# Patient Record
Sex: Female | Born: 1955
Health system: Southern US, Community
[De-identification: ages and names within clinical notes are randomized; demographics above are authoritative.]

## PROBLEM LIST (undated history)

## (undated) DIAGNOSIS — L708 Other acne: Secondary | ICD-10-CM

## (undated) DIAGNOSIS — Z9289 Personal history of other medical treatment: Secondary | ICD-10-CM

## (undated) DIAGNOSIS — Z800 Family history of malignant neoplasm of digestive organs: Secondary | ICD-10-CM

## (undated) DIAGNOSIS — R51 Headache: Secondary | ICD-10-CM

## (undated) DIAGNOSIS — R519 Headache, unspecified: Secondary | ICD-10-CM

## (undated) DIAGNOSIS — F419 Anxiety disorder, unspecified: Secondary | ICD-10-CM

## (undated) DIAGNOSIS — Z803 Family history of malignant neoplasm of breast: Secondary | ICD-10-CM

## (undated) DIAGNOSIS — G4733 Obstructive sleep apnea (adult) (pediatric): Secondary | ICD-10-CM

## (undated) DIAGNOSIS — F3289 Other specified depressive episodes: Secondary | ICD-10-CM

## (undated) DIAGNOSIS — K602 Anal fissure, unspecified: Secondary | ICD-10-CM

## (undated) DIAGNOSIS — K56609 Unspecified intestinal obstruction, unspecified as to partial versus complete obstruction: Secondary | ICD-10-CM

## (undated) DIAGNOSIS — K648 Other hemorrhoids: Secondary | ICD-10-CM

## (undated) DIAGNOSIS — G8929 Other chronic pain: Secondary | ICD-10-CM

## (undated) DIAGNOSIS — E785 Hyperlipidemia, unspecified: Secondary | ICD-10-CM

## (undated) DIAGNOSIS — M1990 Unspecified osteoarthritis, unspecified site: Secondary | ICD-10-CM

## (undated) DIAGNOSIS — F329 Major depressive disorder, single episode, unspecified: Secondary | ICD-10-CM

## (undated) DIAGNOSIS — J309 Allergic rhinitis, unspecified: Secondary | ICD-10-CM

## (undated) DIAGNOSIS — K219 Gastro-esophageal reflux disease without esophagitis: Secondary | ICD-10-CM

## (undated) DIAGNOSIS — L659 Nonscarring hair loss, unspecified: Secondary | ICD-10-CM

## (undated) DIAGNOSIS — F3189 Other bipolar disorder: Secondary | ICD-10-CM

## (undated) DIAGNOSIS — K5900 Constipation, unspecified: Secondary | ICD-10-CM

## (undated) DIAGNOSIS — J302 Other seasonal allergic rhinitis: Secondary | ICD-10-CM

## (undated) DIAGNOSIS — F32A Depression, unspecified: Secondary | ICD-10-CM

## (undated) HISTORY — PX: TONSILLECTOMY: SUR1361

## (undated) HISTORY — DX: Gastro-esophageal reflux disease without esophagitis: K21.9

## (undated) HISTORY — PX: BREAST LUMPECTOMY: SHX2

## (undated) HISTORY — DX: Other hemorrhoids: K64.8

## (undated) HISTORY — DX: Other acne: L70.8

## (undated) HISTORY — DX: Anal fissure, unspecified: K60.2

## (undated) HISTORY — DX: Family history of malignant neoplasm of digestive organs: Z80.0

## (undated) HISTORY — DX: Allergic rhinitis, unspecified: J30.9

## (undated) HISTORY — DX: Other chronic pain: G89.29

## (undated) HISTORY — DX: Major depressive disorder, single episode, unspecified: F32.9

## (undated) HISTORY — DX: Depression, unspecified: F32.A

## (undated) HISTORY — DX: Headache: R51

## (undated) HISTORY — DX: Other specified depressive episodes: F32.89

## (undated) HISTORY — DX: Personal history of other medical treatment: Z92.89

## (undated) HISTORY — DX: Nonscarring hair loss, unspecified: L65.9

## (undated) HISTORY — PX: HAND SURGERY: SHX662

## (undated) HISTORY — DX: Hyperlipidemia, unspecified: E78.5

## (undated) HISTORY — DX: Headache, unspecified: R51.9

## (undated) HISTORY — DX: Family history of malignant neoplasm of breast: Z80.3

## (undated) HISTORY — DX: Unspecified osteoarthritis, unspecified site: M19.90

## (undated) HISTORY — DX: Unspecified intestinal obstruction, unspecified as to partial versus complete obstruction: K56.609

## (undated) HISTORY — PX: KNEE SURGERY: SHX244

## (undated) HISTORY — PX: RADICAL HYSTERECTOMY: SHX2283

## (undated) HISTORY — DX: Obstructive sleep apnea (adult) (pediatric): G47.33

## (undated) HISTORY — DX: Other bipolar disorder: F31.89

## (undated) HISTORY — DX: Anxiety disorder, unspecified: F41.9

---

## 1998-08-05 ENCOUNTER — Other Ambulatory Visit: Admission: RE | Admit: 1998-08-05 | Discharge: 1998-08-05 | Payer: Self-pay | Admitting: Obstetrics & Gynecology

## 1999-09-29 ENCOUNTER — Other Ambulatory Visit: Admission: RE | Admit: 1999-09-29 | Discharge: 1999-09-29 | Payer: Self-pay | Admitting: Obstetrics & Gynecology

## 2001-01-10 ENCOUNTER — Ambulatory Visit (HOSPITAL_COMMUNITY): Admission: RE | Admit: 2001-01-10 | Discharge: 2001-01-10 | Payer: Self-pay | Admitting: Obstetrics & Gynecology

## 2001-01-10 ENCOUNTER — Encounter: Payer: Self-pay | Admitting: Obstetrics & Gynecology

## 2001-05-20 ENCOUNTER — Encounter: Payer: Self-pay | Admitting: Gastroenterology

## 2002-06-13 ENCOUNTER — Other Ambulatory Visit: Admission: RE | Admit: 2002-06-13 | Discharge: 2002-06-13 | Payer: Self-pay | Admitting: Obstetrics & Gynecology

## 2003-03-07 ENCOUNTER — Ambulatory Visit (HOSPITAL_COMMUNITY): Admission: RE | Admit: 2003-03-07 | Discharge: 2003-03-07 | Payer: Self-pay | Admitting: Obstetrics & Gynecology

## 2003-03-07 ENCOUNTER — Encounter: Payer: Self-pay | Admitting: Obstetrics & Gynecology

## 2003-07-28 ENCOUNTER — Other Ambulatory Visit: Admission: RE | Admit: 2003-07-28 | Discharge: 2003-07-28 | Payer: Self-pay | Admitting: *Deleted

## 2004-07-22 ENCOUNTER — Ambulatory Visit: Payer: Self-pay | Admitting: Internal Medicine

## 2004-07-25 ENCOUNTER — Ambulatory Visit (HOSPITAL_COMMUNITY): Admission: RE | Admit: 2004-07-25 | Discharge: 2004-07-25 | Payer: Self-pay | Admitting: Obstetrics & Gynecology

## 2004-10-05 ENCOUNTER — Ambulatory Visit: Payer: Self-pay | Admitting: Internal Medicine

## 2004-10-12 ENCOUNTER — Ambulatory Visit: Payer: Self-pay | Admitting: Internal Medicine

## 2004-10-26 ENCOUNTER — Ambulatory Visit: Payer: Self-pay | Admitting: Internal Medicine

## 2004-11-22 ENCOUNTER — Ambulatory Visit: Payer: Self-pay | Admitting: Internal Medicine

## 2005-01-11 ENCOUNTER — Ambulatory Visit: Payer: Self-pay | Admitting: Internal Medicine

## 2005-01-26 ENCOUNTER — Ambulatory Visit: Payer: Self-pay | Admitting: Internal Medicine

## 2005-03-23 ENCOUNTER — Ambulatory Visit: Payer: Self-pay | Admitting: Internal Medicine

## 2005-04-19 ENCOUNTER — Ambulatory Visit: Payer: Self-pay | Admitting: Internal Medicine

## 2005-04-25 ENCOUNTER — Ambulatory Visit: Payer: Self-pay | Admitting: Gastroenterology

## 2005-06-05 ENCOUNTER — Ambulatory Visit: Payer: Self-pay | Admitting: Internal Medicine

## 2005-08-03 ENCOUNTER — Ambulatory Visit (HOSPITAL_BASED_OUTPATIENT_CLINIC_OR_DEPARTMENT_OTHER): Admission: RE | Admit: 2005-08-03 | Discharge: 2005-08-03 | Payer: Self-pay | Admitting: Orthopedic Surgery

## 2005-08-22 ENCOUNTER — Ambulatory Visit: Payer: Self-pay | Admitting: Internal Medicine

## 2005-09-04 ENCOUNTER — Encounter: Admission: RE | Admit: 2005-09-04 | Discharge: 2005-10-04 | Payer: Self-pay | Admitting: Orthopedic Surgery

## 2005-10-05 ENCOUNTER — Encounter: Admission: RE | Admit: 2005-10-05 | Discharge: 2006-01-03 | Payer: Self-pay | Admitting: Orthopedic Surgery

## 2005-10-11 ENCOUNTER — Ambulatory Visit: Payer: Self-pay | Admitting: Internal Medicine

## 2005-10-30 ENCOUNTER — Ambulatory Visit: Payer: Self-pay | Admitting: Internal Medicine

## 2006-02-16 ENCOUNTER — Ambulatory Visit: Payer: Self-pay | Admitting: Internal Medicine

## 2006-04-18 ENCOUNTER — Ambulatory Visit: Payer: Self-pay | Admitting: Internal Medicine

## 2006-05-29 ENCOUNTER — Inpatient Hospital Stay (HOSPITAL_COMMUNITY): Admission: RE | Admit: 2006-05-29 | Discharge: 2006-05-30 | Payer: Self-pay | Admitting: Obstetrics & Gynecology

## 2006-05-29 ENCOUNTER — Encounter (INDEPENDENT_AMBULATORY_CARE_PROVIDER_SITE_OTHER): Payer: Self-pay | Admitting: *Deleted

## 2006-06-27 ENCOUNTER — Ambulatory Visit: Payer: Self-pay | Admitting: Internal Medicine

## 2006-06-27 LAB — CONVERTED CEMR LAB
Lithium Lvl: 1 meq/L (ref 0.80–1.40)
TSH: 0.83 microintl units/mL (ref 0.35–5.50)

## 2007-03-20 ENCOUNTER — Ambulatory Visit: Payer: Self-pay | Admitting: Internal Medicine

## 2007-03-20 DIAGNOSIS — E785 Hyperlipidemia, unspecified: Secondary | ICD-10-CM | POA: Insufficient documentation

## 2007-03-20 DIAGNOSIS — F3189 Other bipolar disorder: Secondary | ICD-10-CM | POA: Insufficient documentation

## 2007-03-20 DIAGNOSIS — F32A Depression, unspecified: Secondary | ICD-10-CM | POA: Insufficient documentation

## 2007-03-20 DIAGNOSIS — F329 Major depressive disorder, single episode, unspecified: Secondary | ICD-10-CM | POA: Insufficient documentation

## 2007-03-20 DIAGNOSIS — L708 Other acne: Secondary | ICD-10-CM | POA: Insufficient documentation

## 2007-03-20 DIAGNOSIS — J069 Acute upper respiratory infection, unspecified: Secondary | ICD-10-CM | POA: Insufficient documentation

## 2007-03-20 LAB — CONVERTED CEMR LAB: Lithium Lvl: 0.98 meq/L (ref 0.80–1.40)

## 2007-03-21 LAB — CONVERTED CEMR LAB
BUN: 11 mg/dL (ref 6–23)
CO2: 25 meq/L (ref 19–32)
Calcium: 10 mg/dL (ref 8.4–10.5)
Chloride: 112 meq/L (ref 96–112)
Cholesterol: 192 mg/dL (ref 0–200)
Creatinine, Ser: 0.8 mg/dL (ref 0.4–1.2)
GFR calc Af Amer: 98 mL/min
GFR calc non Af Amer: 81 mL/min
Glucose, Bld: 98 mg/dL (ref 70–99)
HDL: 44.4 mg/dL (ref >39.0)
LDL Cholesterol: 121 mg/dL — ABNORMAL HIGH (ref 0–99)
Potassium: 3.9 meq/L (ref 3.5–5.1)
Sodium: 141 meq/L (ref 135–145)
TSH: 0.99 microintl units/mL (ref 0.35–5.50)
Total CHOL/HDL Ratio: 4.3
Triglycerides: 131 mg/dL (ref 0–149)
VLDL: 26 mg/dL (ref 0–40)

## 2007-04-08 ENCOUNTER — Telehealth: Payer: Self-pay | Admitting: Internal Medicine

## 2007-04-26 ENCOUNTER — Encounter: Payer: Self-pay | Admitting: Internal Medicine

## 2007-06-04 ENCOUNTER — Telehealth: Payer: Self-pay | Admitting: Internal Medicine

## 2007-07-11 ENCOUNTER — Ambulatory Visit: Payer: Self-pay | Admitting: Gastroenterology

## 2007-08-08 ENCOUNTER — Ambulatory Visit: Payer: Self-pay | Admitting: Gastroenterology

## 2007-08-08 ENCOUNTER — Encounter: Payer: Self-pay | Admitting: Internal Medicine

## 2008-01-02 ENCOUNTER — Telehealth (INDEPENDENT_AMBULATORY_CARE_PROVIDER_SITE_OTHER): Payer: Self-pay | Admitting: *Deleted

## 2008-01-07 ENCOUNTER — Telehealth (INDEPENDENT_AMBULATORY_CARE_PROVIDER_SITE_OTHER): Payer: Self-pay | Admitting: *Deleted

## 2008-01-13 ENCOUNTER — Encounter: Payer: Self-pay | Admitting: Internal Medicine

## 2008-01-14 ENCOUNTER — Encounter
Admission: RE | Admit: 2008-01-14 | Discharge: 2008-01-14 | Payer: Self-pay | Admitting: Physical Medicine and Rehabilitation

## 2008-01-22 ENCOUNTER — Encounter: Payer: Self-pay | Admitting: Internal Medicine

## 2008-04-13 ENCOUNTER — Telehealth: Payer: Self-pay | Admitting: Gastroenterology

## 2008-04-24 DIAGNOSIS — Z860100 Personal history of colon polyps, unspecified: Secondary | ICD-10-CM | POA: Insufficient documentation

## 2008-04-28 ENCOUNTER — Ambulatory Visit: Payer: Self-pay | Admitting: Gastroenterology

## 2008-04-28 DIAGNOSIS — K648 Other hemorrhoids: Secondary | ICD-10-CM | POA: Insufficient documentation

## 2008-04-28 DIAGNOSIS — R109 Unspecified abdominal pain: Secondary | ICD-10-CM | POA: Insufficient documentation

## 2008-08-25 ENCOUNTER — Encounter: Payer: Self-pay | Admitting: Internal Medicine

## 2008-08-25 ENCOUNTER — Ambulatory Visit (HOSPITAL_BASED_OUTPATIENT_CLINIC_OR_DEPARTMENT_OTHER): Admission: RE | Admit: 2008-08-25 | Discharge: 2008-08-25 | Payer: Self-pay | Admitting: Obstetrics & Gynecology

## 2008-08-29 ENCOUNTER — Ambulatory Visit: Payer: Self-pay | Admitting: Internal Medicine

## 2008-10-06 ENCOUNTER — Ambulatory Visit: Payer: Self-pay | Admitting: Internal Medicine

## 2008-10-06 DIAGNOSIS — G4733 Obstructive sleep apnea (adult) (pediatric): Secondary | ICD-10-CM | POA: Insufficient documentation

## 2008-10-13 ENCOUNTER — Encounter: Payer: Self-pay | Admitting: Internal Medicine

## 2008-10-18 DIAGNOSIS — J302 Other seasonal allergic rhinitis: Secondary | ICD-10-CM | POA: Insufficient documentation

## 2008-10-18 DIAGNOSIS — J3089 Other allergic rhinitis: Secondary | ICD-10-CM

## 2008-10-21 ENCOUNTER — Encounter: Payer: Self-pay | Admitting: Internal Medicine

## 2008-10-29 ENCOUNTER — Encounter: Payer: Self-pay | Admitting: Internal Medicine

## 2008-11-24 ENCOUNTER — Telehealth: Payer: Self-pay | Admitting: Internal Medicine

## 2008-12-22 ENCOUNTER — Encounter: Payer: Self-pay | Admitting: Internal Medicine

## 2008-12-27 ENCOUNTER — Telehealth: Payer: Self-pay | Admitting: Internal Medicine

## 2009-01-05 ENCOUNTER — Ambulatory Visit: Payer: Self-pay | Admitting: Internal Medicine

## 2009-01-06 ENCOUNTER — Telehealth (INDEPENDENT_AMBULATORY_CARE_PROVIDER_SITE_OTHER): Payer: Self-pay | Admitting: *Deleted

## 2009-01-07 ENCOUNTER — Telehealth: Payer: Self-pay | Admitting: Internal Medicine

## 2009-01-18 ENCOUNTER — Encounter: Payer: Self-pay | Admitting: Internal Medicine

## 2009-01-27 ENCOUNTER — Encounter: Payer: Self-pay | Admitting: Internal Medicine

## 2009-03-05 ENCOUNTER — Ambulatory Visit: Payer: Self-pay | Admitting: Family Medicine

## 2009-03-05 DIAGNOSIS — L659 Nonscarring hair loss, unspecified: Secondary | ICD-10-CM | POA: Insufficient documentation

## 2009-03-08 LAB — CONVERTED CEMR LAB: TSH: 0.98 microintl units/mL (ref 0.35–5.50)

## 2009-05-20 ENCOUNTER — Ambulatory Visit: Payer: Self-pay | Admitting: Internal Medicine

## 2009-05-25 ENCOUNTER — Ambulatory Visit: Payer: Self-pay | Admitting: Internal Medicine

## 2009-12-03 ENCOUNTER — Encounter: Payer: Self-pay | Admitting: Internal Medicine

## 2009-12-21 ENCOUNTER — Ambulatory Visit (HOSPITAL_COMMUNITY): Admission: RE | Admit: 2009-12-21 | Discharge: 2009-12-21 | Payer: Self-pay | Admitting: Obstetrics & Gynecology

## 2010-02-01 ENCOUNTER — Encounter: Admission: RE | Admit: 2010-02-01 | Discharge: 2010-02-01 | Payer: Self-pay | Admitting: Neurosurgery

## 2010-03-18 ENCOUNTER — Encounter: Payer: Self-pay | Admitting: Internal Medicine

## 2010-07-01 ENCOUNTER — Ambulatory Visit (HOSPITAL_COMMUNITY)
Admission: RE | Admit: 2010-07-01 | Discharge: 2010-07-01 | Payer: Self-pay | Source: Home / Self Care | Attending: Obstetrics & Gynecology | Admitting: Obstetrics & Gynecology

## 2010-07-04 LAB — CBC
HCT: 37.8 % (ref 36.0–46.0)
Hemoglobin: 13 g/dL (ref 12.0–15.0)
MCH: 30.5 pg (ref 26.0–34.0)
MCHC: 34.4 g/dL (ref 30.0–36.0)
MCV: 88.7 fL (ref 78.0–100.0)
Platelets: 319 10*3/uL (ref 150–400)
RBC: 4.26 MIL/uL (ref 3.87–5.11)
RDW: 13.3 % (ref 11.5–15.5)
WBC: 6.1 10*3/uL (ref 4.0–10.5)

## 2010-07-04 LAB — TYPE AND SCREEN
ABO/RH(D): B POS
Antibody Screen: NEGATIVE

## 2010-07-04 LAB — SURGICAL PCR SCREEN
MRSA, PCR: NEGATIVE
Staphylococcus aureus: NEGATIVE

## 2010-07-10 ENCOUNTER — Encounter: Payer: Self-pay | Admitting: Gastroenterology

## 2010-07-15 NOTE — Op Note (Addendum)
NAMEKYLEAH, Brittany [name]       ACCOUNT NO.:  [id number]  MEDICAL RECORD NO.:  [id number]          PATIENT TYPE:  AMB  LOCATION:  SDC                           FACILITY:  [facility]  PHYSICIAN:  Genia Del, M.D.DATE OF BIRTH:  1955-06-29  DATE OF PROCEDURE:  07/01/2010 DATE OF DISCHARGE:  07/01/2010                              OPERATIVE REPORT   PREOPERATIVE DIAGNOSES:  Left ovarian cysts, status post total laparoscopy hysterectomy and right salpingo-oophorectomy.  POSTOPERATIVE DIAGNOSES:  Left ovarian cysts, status post total laparoscopy hysterectomy and right salpingo-oophorectomy and adhesions between left ovary and left pelvic wall and bowels and adhesions between small bowel and right pelvic wall.  PROCEDURE:  Left oophorectomy assisted with da Vinci Robot and lysis of adhesions.  SURGEON:  Genia Del, M.D.  ASSISTANT:  Lendon Colonel, MD  PROCEDURE:  Under general anesthesia with endotracheal intubation, the patient is in lithotomy position.  She was prepped with Surgi-Prep on the abdomen and Betadine on the vulvar and vaginal areas.  We then draped the patient as usual.  The Foley was put in place in the bladder. We went to the abdomen.  The patient previously had a Engineer, building services assisted TLH-RSO therefore the same incisions will be used.  We infiltrated the skin with Marcaine 0.25% plain at all the incision sites.  We opened the supraumbilical incision over 1.5 cm with the scalpel.  We opened the aponeurosis with Mayo scissors and opened the parietal peritoneum with Mayo scissors.  We put a pursestring stitch of Vicryl 0 on the aponeurosis.  We inserted the [facility] at that level and created a pneumoperitoneum with CO2.  We inserted the camera at that level.  No adhesion is present between the bowels and the anterior wall.  We therefore made incisions at all the previous sites with a scalpel.  This was a semicircular configuration with 2 robotic ports on the  left, one robotic port on the right, and the assistant port on the lower right. Once all ports are inserted under direct vision, we docked the robot on the left side without difficulty.  We then insert the instruments.  The Endo Shear scissors on the first robotic arm, the PK on the second robotic arm and the Prograf on the third robotic arm.  We then went to the console.  We visualized the abdomen cavity which was normal.  The pelvic cavity presented a large ovarian cyst on the left completely adherent to the left pelvic wall.  The bowels were also adherent to the ovarian cyst.  We had adhesions also between the small bowels and the right pelvic wall.  We started at that level and those are very fine adhesions, and they are easily freed, on the right pelvic sidewall.  We then went to the left ovary with a cyst that is about 5-6 cm in diameter.  This ovarian cyst is completely adherent all around from the infundibulopelvic ligament on the upper left side wall to the bladder to the lower pelvic sidewall at the lower part of the ovary and all the way up to the infundibulopelvic ligament from the inner side.  We went very gently starting to free  the distal aspect of the ovary close to the bladder and the upper side wall.  We then freed the inner part being very careful with the sidewall in the left ureter.  We freed the left colon from the inner lower side of the ovary.  Finally, we can visualize the anatomy better the cul-de-sac was visible.  The left side wall was visible and we can identify the infundibulopelvic ligaments very clearly high up far from the left ureter.  We therefore cauterized and sectioned the left infundibulopelvic ligament.  We used a PK to coagulate and the Endo Shear scissors to section.  From that point on, we can lift the left ovary with the cyst very nicely.  Note that in the process, the ovarian cyst ruptured and a very clear yellowish fluid was suctioned. We had  done washings prior to any manipulation in the pelvis, and the surface of the ovarian cyst was very smooth with no solid part at all. We proceeded the dissection until the left ovary was completely freed. Hemostasis was adequate at all levels.  We used an Endobag and the assistant port.  The left ovary was put in the bag and sent to Pathology.  We then irrigated and suctioned the pelvic cavity.  We had excellent hemostasis.  We completed the lysis of adhesions between the bowels and the pelvic wall.  We then removed all instruments.  We undocked the robot and we went by laparoscopy.  We removed Trendelenburg and irrigated and suctioned.  We then used Interceed cut in 2 and applied on both pelvic sidewalls to prevent recurrence of adhesions.  We had excellent hemostasis at all levels.  We therefore removed all instruments, evacuated the CO2, removed all ports under direct vision. We closed all incisions with Vicryl 4-0 in a subcuticular stitch and add Dermabond on all incisions at the supraumbilical incision the pursestring stitch was attached first and then the Vicryl 4-0 and Dermabond.  Hemostasis was adequate at all incisions.  The estimated blood loss was less than 50 mL.  The patient received Ancef 1 g IV before induction.  The count of instruments, sponges was complete.  No complications occurred, and the patient was brought to recovery room in good stable status.     Genia Del, M.D.     ML/MEDQ  D:  07/01/2010  T:  07/02/2010  Job:  [id number]  Electronically Signed by Genia Del M.D. on 07/15/2010 09:16:03 AM

## 2010-07-19 NOTE — Letter (Signed)
Summary: [facility]  [facility]   Imported By: Maryln Gottron 12/28/2009 12:38:25  _____________________________________________________________________  External Attachment:    Type:   Image     Comment:   External Document

## 2010-07-19 NOTE — Letter (Signed)
Summary: [facility]  [facility]   Imported By: Maryln Gottron 04/18/2010 11:08:00  _____________________________________________________________________  External Attachment:    Type:   Image     Comment:   External Document

## 2010-08-19 ENCOUNTER — Other Ambulatory Visit: Payer: Self-pay | Admitting: Obstetrics & Gynecology

## 2010-08-19 DIAGNOSIS — R9230 Dense breasts, unspecified: Secondary | ICD-10-CM

## 2010-08-19 DIAGNOSIS — R922 Inconclusive mammogram: Secondary | ICD-10-CM

## 2010-08-19 DIAGNOSIS — N649 Disorder of breast, unspecified: Secondary | ICD-10-CM

## 2010-08-19 DIAGNOSIS — Z803 Family history of malignant neoplasm of breast: Secondary | ICD-10-CM

## 2010-09-04 LAB — CBC
HCT: 39.9 % (ref 36.0–46.0)
Hemoglobin: 13.7 g/dL (ref 12.0–15.0)
MCH: 31.6 pg (ref 26.0–34.0)
MCHC: 34.4 g/dL (ref 30.0–36.0)
MCV: 91.8 fL (ref 78.0–100.0)
Platelets: 372 10*3/uL (ref 150–400)
RBC: 4.35 MIL/uL (ref 3.87–5.11)
RDW: 13.4 % (ref 11.5–15.5)
WBC: 7.1 10*3/uL (ref 4.0–10.5)

## 2010-09-04 LAB — COMPREHENSIVE METABOLIC PANEL
ALT: 27 U/L (ref 0–35)
AST: 22 U/L (ref 0–37)
Albumin: 4.1 g/dL (ref 3.5–5.2)
Alkaline Phosphatase: 48 U/L (ref 39–117)
BUN: 4 mg/dL — ABNORMAL LOW (ref 6–23)
CO2: 25 mEq/L (ref 19–32)
Calcium: 10.2 mg/dL (ref 8.4–10.5)
Chloride: 111 mEq/L (ref 96–112)
Creatinine, Ser: 0.71 mg/dL (ref 0.4–1.2)
GFR calc Af Amer: >60 mL/min (ref >60)
GFR calc non Af Amer: >60 mL/min (ref >60)
Glucose, Bld: 103 mg/dL — ABNORMAL HIGH (ref 70–99)
Potassium: 4.3 mEq/L (ref 3.5–5.1)
Sodium: 141 mEq/L (ref 135–145)
Total Bilirubin: 0.5 mg/dL (ref 0.3–1.2)
Total Protein: 6.9 g/dL (ref 6.0–8.3)

## 2010-09-04 LAB — TYPE AND SCREEN
ABO/RH(D): B POS
Antibody Screen: NEGATIVE

## 2010-09-04 LAB — ABO/RH: ABO/RH(D): B POS

## 2010-09-04 LAB — SURGICAL PCR SCREEN
MRSA, PCR: NEGATIVE
Staphylococcus aureus: NEGATIVE

## 2010-10-27 ENCOUNTER — Ambulatory Visit
Admission: RE | Admit: 2010-10-27 | Discharge: 2010-10-27 | Disposition: A | Discharge status: Home / Self Care | Payer: Medicare Other | Source: Ambulatory Visit | Attending: Obstetrics & Gynecology | Admitting: Obstetrics & Gynecology

## 2010-10-27 DIAGNOSIS — Z803 Family history of malignant neoplasm of breast: Secondary | ICD-10-CM

## 2010-10-27 DIAGNOSIS — R9230 Dense breasts, unspecified: Secondary | ICD-10-CM

## 2010-10-27 DIAGNOSIS — R922 Inconclusive mammogram: Secondary | ICD-10-CM

## 2010-10-27 DIAGNOSIS — N649 Disorder of breast, unspecified: Secondary | ICD-10-CM

## 2010-10-27 MED ORDER — GADOBENATE DIMEGLUMINE 529 MG/ML IV SOLN
15.0000 mL | Freq: Once | INTRAVENOUS | Status: AC | PRN
  Administered 2010-10-27: 15 mL via INTRAVENOUS

## 2010-11-01 NOTE — Procedures (Signed)
NAME:  Brittany [name], Brittany [name]       ACCOUNT NO.:  [id number]   MEDICAL RECORD NO.:  [id number]          PATIENT TYPE:  OUT   LOCATION:  SLEEP CENTER                 FACILITY:  [facility]   PHYSICIAN:  Clinton D. Maple Hudson, MD, FCCP, FACPDATE OF BIRTH:  1956-04-07   DATE OF STUDY:  08/25/2008                            NOCTURNAL POLYSOMNOGRAM   REFERRING PHYSICIAN:   REFERRING PHYSICIAN:  Genia Del, MD   DATE OF THE STUDY:  August 25, 2008   INDICATION FOR STUDY:  Insomnia with sleep apnea.   EPWORTH SLEEPINESS SCORE:  0/24.  BMI 34.8.  Weight 190 pounds.  Height  62 inches.  Neck 14 inches.   MEDICATIONS:  Charted and reviewed.   SLEEP ARCHITECTURE:  Total sleep time 301 minutes with sleep efficiency  75.3%.  Stage I was 16.1%, stage II 58%, stage III 15.3%, REM 10.6% of  total sleep time.  Sleep latency 25 minutes, REM latency 145 minutes,  awake after sleep onset 73.5 minutes, arousal index 29.7.  Bedtime  medication cyclobenzaprine.   RESPIRATORY DATA:  Apnea/hypopnea index (AHI) 10.8 per hour.  A total of  54 events was scored, all as hypopneas.  Events were not positional,  more frequent while supine as expected.  REM AHI 30 per hour.  This was  a diagnostic NPSG study as requested and CPAP titration was not done.   OXYGEN DATA:  Moderate snoring with oxygen desaturation to a nadir of  85%.  Mean oxygen saturation through the study was 93.3% on room air.   CARDIAC DATA:  Normal sinus rhythm.   MOVEMENT-PARASOMNIA:  No significant movement disorder.  Bathroom x1.   IMPRESSIONS-RECOMMENDATIONS:  1. Mild obstructive sleep apnea/hypopnea syndrome, apnea-hypopnea      index 10.8 per hour.  Events were not positional, but somewhat more      common while supine.  Moderate snoring with oxygen desaturation to      a nadir of 85% on room air.  2. Scores in this range may be addressed conservatively with weight      loss treatment for significant nasal      congestion or upper  airway obstruction, and encouragement to sleep      off flat of back.  If clinical situation warrants, then the patient      can return for CPAP titration or evaluate for alternative      management as appropriate.      Clinton D. Maple Hudson, MD, [facility], FACP  Diplomate, Biomedical engineer of Sleep Medicine  Electronically Signed     CDY/MEDQ  D:  08/30/2008 09:48:45  T:  08/31/2008 16:10:96  Job:  [id number]

## 2010-11-01 NOTE — Assessment & Plan Note (Signed)
[facility]                         GASTROENTEROLOGY OFFICE NOTE   NAME:Brittany [name], Brittany [name]              MRN:          [id number]  DATE:07/11/2007                            DOB:          12-28-1955    This is a 55 year old [place] American female that I have previously  evaluated for change in bowel habits and hematochezia.  She underwent  colonoscopy in December 2002 that showed a hyperplastic colon polyp and  internal hemorrhoids that were injected.  She has a history of large  uterine leiomyomas and she is status post partial hysterectomy in  December 2007.  She states shortly after the hysterectomy she noted  worsening problems with constipation.  She notes hard small stools and  difficulty in evacuating.  She has used a fiber supplement and a colon  cleanse laxative on a regular basis without significant improvement in  symptoms.  She has recently noted small amounts of bright red blood per  rectum with hard bowel movements.  She states she sent in Hemoccults  through the [facility] approximately one week ago and the results are  pending.  She complains of frequent post prandial and evening nausea  that is temporarily improved with Maalox, she takes aspirin on an  intermittent basis for body aches and pains.  She has a maternal uncle  who developed colon cancer at about age 48, no other family members with  colon cancer, colon polyps or inflammatory bowel disease.  She has no  dysphagia, odynophagia, nausea, vomiting or weight loss.   CURRENT MEDICATIONS:  Listed on the chart, updated and reviewed.   MEDICATION ALLERGIES:  HYDROCODONE LEADING TO NAUSEA AND VOMITING.   PHYSICAL EXAMINATION:  No acute distress.  Weight 173.8 pounds, blood  pressure is 86/60 in both arms, pulse is 76 and regular.  HEENT:  Anicteric sclerae, oropharynx clear.  CHEST:  Clear to auscultation bilaterally.  CARDIAC:  Regular rate and rhythm without murmurs  appreciated.  ABDOMEN:  Soft, nontender, nondistended, normoactive bowel sounds, no  palpable organomegaly, masses or hernias.   ASSESSMENT/PLAN:  1. Worsening constipation associated with small volume hematochezia      and a maternal uncle with colon cancer.  Need to exclude colorectal      neoplasms and other disorders.  She may have functional      constipation with hemorrhoidal bleeding.  She is advised to      discontinue colon cleanse and continue on daily fiber supplements.      She is advised to increase her fiber and fluid intake.  Begin      MiraLax up to 4 times a day, titrated for adequate bowel movements.      Risks, benefits and alternatives to colonoscopy with possible      biopsy, possible polypectomy and possible destruction of internal      hemorrhoids discussed with the patient and she consent to proceed.      This will be scheduled electively.  2. Suspected reflux symptoms, rule out ulcer disease, gastritis and      duodenitis.  Minimize aspirin and non-steroidal anti-inflammatory      drug usage.  Begin omeprazole  20 mg p.o.      q.a.m.  Risks, benefits and alternatives to upper endoscopy with      possible biopsy      discussed with the patient and she consent to proceed.  This will      be scheduled electively at the time of her colonoscopy.     Brittany Lick. Russella Dar, MD, [facility]  Electronically Signed    MTS/MedQ  DD: 07/11/2007  DT: 07/11/2007  Job #: [id number]

## 2010-11-04 NOTE — Discharge Summary (Signed)
NAMELAYAH, SKOUSEN       ACCOUNT NO.:  [id number]   MEDICAL RECORD NO.:  [id number]          PATIENT TYPE:  INP   LOCATION:  1614                         FACILITY:  [facility]   PHYSICIAN:  Genia Del, M.D.DATE OF BIRTH:  April 20, 1956   DATE OF ADMISSION:  05/29/2006  DATE OF DISCHARGE:  05/30/2006                               DISCHARGE SUMMARY   ADMISSION DIAGNOSIS:  Large symptomatic uterine myomas.   DISCHARGE DIAGNOSIS:  Large symptomatic uterine myomas.   INTERVENTION:  Diagnostic laparoscopy with total abdominal hysterectomy.   HOSPITAL COURSE:  The patient was admitted for large symptomatic uterine  myomas and a total laparoscopic hysterectomy assisted with DaVinci robot  was contemplated but a diagnostic laparoscopy was first done and it was  realized that the uterus with the myomas was too wide and not movable  from side-to-side to access the lateral aspects of the uterus to  complete the surgery with robotics. The decision was therefore taken to  proceed with laparotomy. A Pfannenstiel incision was done and a total  abdominal hysterectomy was performed.  The estimated blood loss was 400  mL. No complications occurred.  The patient was doing very well on  postoperative day #1. She was afebrile and hemodynamically stable.  Her  postoperative hemoglobin was 9.7.  The patient was doing so well that  the decision was taken to discharge her on December 12. She was given  postoperative advice, Dilaudid tablets were prescribed p.r.n. for pain,  and she was advised to follow-up at [facility] on December 14 for  staple removal.  The final pathology came back multiple leiomyomas.      Genia Del, M.D.  Electronically Signed     ML/MEDQ  D:  08/08/2006  T:  08/08/2006  Job:  [id number]

## 2010-11-04 NOTE — Op Note (Signed)
NAMESHIRLETTE, SCARBER       ACCOUNT NO.:  [id number]   MEDICAL RECORD NO.:  [id number]          PATIENT TYPE:  AMB   LOCATION:  DSC                          FACILITY:  [facility]   PHYSICIAN:  Nadara Mustard, MD     DATE OF BIRTH:  February 03, 1956   DATE OF PROCEDURE:  08/03/2005  DATE OF DISCHARGE:                                 OPERATIVE REPORT   PREOPERATIVE DIAGNOSIS:  Medial meniscal tear, left knee.   POSTOPERATIVE DIAGNOSIS:  Medial meniscal tear, left knee, and osteochondral  defect medial femoral condyle, medial tibial plateau, patella, and trochlea.   PROCEDURE:  1.  Partial medial meniscectomy.  2.  Abrasion chondroplasty medial femoral condyle, medial tibial plateau,      patella, and trochlea.   SURGEON:  Nadara Mustard, M.D.   ANESTHESIA:  Knee block.   ESTIMATED BLOOD LOSS:  Minimal.   ANTIBIOTICS:  None.   DRAINS:  None.   COMPLICATIONS:  None.   TOURNIQUET TIME:  None.   DISPOSITION:  To the PACU in stable condition.   INDICATIONS FOR PROCEDURE:  The patient is a 55 year old woman with  mechanical symptoms in her left knee.  She has failed conservative care.  MRI scan confirmed a medial meniscal tear and she presents at this time for  arthroscopic intervention.  The risks and benefits were discussed including  infection, neurovascular injury, persistent pain, need for additional  surgery.  The patient states she understands and wishes to proceed at this  time.   DESCRIPTION OF PROCEDURE:  The patient underwent a knee block and was  brought to OR room 5.  After an adequate level of anesthesia was obtained,  the patient's left lower extremity was prepped using DuraPrep and draped in  a sterile field.  The scope was inserted through the inferolateral portal  and an inferomedial working portal was established.  The patient did have  some pain despite Propofol and the LMA was placed during the procedure.  Examination showed a degenerative tear of the  medial meniscus which was  debrided.  She also had a large osteochondral defect of the medial tibial  plateau and medial femoral condyle.  This was debrided with a shaver down  through viable bleeding subchondral bone.  Examination of the notch showed  an intact ACL.  Examination of the lateral joint line showed good articular  cartilage and an intact lateral meniscus.  Examination of the patellofemoral  joint showed a large osteochondral defect of the patella and trochlea and  this was also debrided with an abrasion chondroplasty.  A survey was then  again performed of all three compartments including the medial and lateral  gutters.  There were no loose bodies in the suprapatellar pouch, medial  lateral joint lines, or medial and lateral gutters.  The instruments were  removed. The portals were closed using 4-0 nylon.  The wounds were covered  with Adaptic, orthopedic sponges, sterile Webril, and a Coban dressing.  The  patient was then extubated and taken to the PACU in stable condition.  Follow up in the office in two weeks.      Randa Evens  Lajoyce Corners, MD  Electronically Signed     MVD/MEDQ  D:  08/03/2005  T:  08/03/2005  Job:  [id number]

## 2010-11-04 NOTE — Op Note (Signed)
[name], [name]       ACCOUNT NO.:  [id number]   MEDICAL RECORD NO.:  [id number]          PATIENT TYPE:  INP   LOCATION:  0005                         FACILITY:  [facility]   PHYSICIAN:  Genia Del, M.D.DATE OF BIRTH:  10-22-1955   DATE OF PROCEDURE:  05/29/2006  DATE OF DISCHARGE:                               OPERATIVE REPORT   PREOPERATIVE DIAGNOSIS:  Large symptomatic uterine myomas.   POSTOPERATIVE DIAGNOSIS:  Large symptomatic uterine myomas.   PROCEDURE:  Diagnostic laparoscopy, followed by total abdominal  hysterectomy.   SURGEON:  Genia Del, M.D.   ASSISTANT:  Richardean Sale, M.D.   PROCEDURE:  Under general anesthesia with endotracheal intubation, the  patient is in lithotomy position.  She is prepped with Betadine on the  abdominal, suprapubic, vulvar, and vaginal areas. The patient is draped  as usual.  A bladder catheter is inserted, and we proceed with insertion  of the KOH ring and RUMI vaginally.  The vaginal exam reveals a mobile  uterus, although very voluminous, reaching 22 cm, about 2 cm above the  umbilicus.  We then go to abdominal time. We make an incision about 10  cm above the fundus with the scalpel after infiltrating the subcutaneous  tissue with Marcaine 0.25% plain. We open the aponeurosis with Mayo  scissors under direct vision, and we put a pursestring stitch at that  level.  We then open the parietal peritoneum bluntly with a finger.  We  then insert the Hasson at that level and introduce the camera after  creating a pneumoperitoneum with CO2.  We visualize the abdominopelvic  cavities.  The uterus is very large. We can see a large myoma in the  right fundal area which was reaching 13 cm by ultrasound.  The vision is  good in terms of height of the uterus, but the width of the uterus is  preventing good access on either side for robotic surgery.  The decision  is therefore taken to remove the camera, remove the Hasson, and  proceed  with total abdominal hysterectomy.  We close the pursestring stitch on  the aponeurosis, attach it, and we close the skin with a subcuticular  stitch of Vicryl 4-0.  We make a Pfannenstiel incision, but a wide one,  with a scalpel.  We open the aponeurosis with Mayo scissors.  The recti  muscles are separated from the aponeurosis on the midline superiorly and  inferiorly.  We opened the parietal peritoneum longitudinally with  Metzenbaum scissors.  We succeed in exteriorizing the uterus. That  caused bleeding from the two subserosal fibroids on the left side.  We  suture that with chromic 0 to control hemostasis until the hysterectomy  is completed we also have a hemorrhagic cyst on the right ovary which  ruptures, and Vicryl two sutures are done to control hemostasis at that  level.  We suture and cut with the electrocautery the right round  ligament and open the visceral peritoneum anteriorly to recline the  bladder.  We did the same on the left side, and the bladder is brought  down. We double clamped with curved Heaneys the tube and utero-ovarian  ligament on the right side.  We section in between and suture with  Vicryl 0.  We go down the right lateral side of the uterus that way with  curved Heaneys, and then straight Heaneys.  We then do the same on the  left side, double clamping and sectioning the left tube and utero-  ovarian ligament.  We continue on the left lateral side of the uterus  with curved Heaney.  We then reach the left uterine artery. We clamp it  with curved Heaneys. We section and suture it with a Vicryl 0.  We then  clamp the left uterosacral ligament.  We section and suture and keep  that on a hemostat. We proceed the same way on the right side. We reach  the angle of the vagina.  We clamped with a curved Heaney at that level,  section with Mayo scissors, and suture with a Heaney stitch of Vicryl 0  and keep that on a hemostat. We proceed the same way on  the right side,  and the uterus is sent to pathology in one piece.  A picture is taken of  it, and the uterus is weighed.  The weight is 5 pounds 4 ounces.  We  then attach the left uterosacral ligament to the left angle of the  vagina.  We do the same on the right side.  We close the vagina with a  locked-running suture of Vicryl 0.  All the pedicles are hemostatic.  We  irrigate and suction the pelvic cavity.  We had put a Balfour and laps  to retract the bowels when the uterus was removed, so now we remove  those.  We closed the aponeurosis with two half-running sutures of  Vicryl 0.  We complete hemostasis at the skin with the electrocautery,  and we approximate the skin with staples.  A dry dressing is applied.  Note that the KOH ring and RUMI were removed at the beginning of the  laparotomy. The estimated blood loss was 400 cc.  The patient was given  Ancef 1 g IV at the beginning of the intervention.  The count of sponges  and instruments was complete x2.  No complication occurred.  The patient  was transferred to recovery room in good stable status.      Genia Del, M.D.  Electronically Signed     ML/MEDQ  D:  05/29/2006  T:  05/29/2006  Job:  [id number]

## 2011-04-04 ENCOUNTER — Telehealth: Payer: Self-pay | Admitting: Internal Medicine

## 2011-04-04 NOTE — Telephone Encounter (Signed)
Pt would like a referral to cardiologist for chest pains and fatigue. Pt is not having chest pain now.

## 2011-04-05 NOTE — Telephone Encounter (Signed)
ok 

## 2011-04-06 NOTE — Telephone Encounter (Signed)
Order sent to [facility].

## 2011-04-27 ENCOUNTER — Encounter: Payer: Self-pay | Admitting: *Deleted

## 2011-04-28 ENCOUNTER — Encounter: Payer: Self-pay | Admitting: Cardiovascular Disease

## 2011-04-28 ENCOUNTER — Ambulatory Visit (INDEPENDENT_AMBULATORY_CARE_PROVIDER_SITE_OTHER): Payer: Medicare Other | Admitting: Cardiovascular Disease

## 2011-04-28 DIAGNOSIS — R079 Chest pain, unspecified: Secondary | ICD-10-CM | POA: Insufficient documentation

## 2011-04-28 DIAGNOSIS — E785 Hyperlipidemia, unspecified: Secondary | ICD-10-CM

## 2011-04-28 NOTE — Patient Instructions (Signed)
Your physician has requested that you have a stress echocardiogram. For further information please visit [email or web address]. Please follow instruction sheet as given.   

## 2011-04-28 NOTE — Progress Notes (Signed)
55 yo patient of Dr Cato Mulligan.  Atypical SSCP last 2 months.  In talking to the patient it is obvious that the issue is stress and it relates to her husband's health.  She is totally overwhelmed with his illness's and needs a break.  She visibly sighing and hyperventilating in the room and gets emotional when she discusses it.  SSCP is heaviness in chest can radiate to both arms and is not related to exercise.  Dyspnea with exercise and elliptical.  No palpitations or syncope.  Compliant with meds.  No previous history of heart problems  ROS: Denies fever, malais, weight loss, blurry vision, decreased visual acuity, cough, sputum, SOB, hemoptysis, pleuritic pain, palpitaitons, heartburn, abdominal pain, melena, lower extremity edema, claudication, or rash.  All other systems reviewed and negative   General: Affect appropriate Healthy:  appears stated age HEENT: normal Neck supple with no adenopathy JVP normal no bruits no thyromegaly Lungs clear with no wheezing and good diaphragmatic motion Heart:  S1/S2 no murmur,rub, gallop or click PMI normal Abdomen: benighn, BS positve, no tenderness, no AAA no bruit.  No HSM or HJR Distal pulses intact with no bruits No edema Neuro non-focal Skin warm and dry No muscular weakness  Medications Current Outpatient Prescriptions  Medication Sig Dispense Refill  . aspirin 325 MG EC tablet Take 325 mg by mouth 2 (two) times daily.        Marland Kitchen lamoTRIgine (LAMICTAL) 150 MG tablet Take 150 mg by mouth daily.        Marland Kitchen lithium carbonate (ESKALITH) 450 MG CR tablet Take 450 mg by mouth 2 (two) times daily.        . Multiple Vitamin (MULTIVITAMIN) capsule Take 1 capsule by mouth daily.          Allergies Cyclobenzaprine hcl and Hydrocodone  Family History: Family History  Problem Relation Age of Onset  . Breast cancer    . Lung cancer    . Colon cancer      Social History: History   Social History  . Marital Status: Married    Spouse Name: N/A   Number of Children: N/A  . Years of Education: N/A   Occupational History  . Not on file.   Social History Main Topics  . Smoking status: Former Games developer  . Smokeless tobacco: Not on file  . Alcohol Use: Not on file  . Drug Use: Not on file  . Sexually Active: Not on file   Other Topics Concern  . Not on file   Social History Narrative  . No narrative on file    Electrocardiogram:  NSR normal ECG rate 68  Assessment and Plan

## 2011-04-28 NOTE — Assessment & Plan Note (Signed)
Cholesterol is at goal.  Continue current dose of statin and diet Rx.  No myalgias or side effects.  F/U  LFT's in 6 months. Lab Results  Component Value Date   LDLCALC 121* 03/20/2007             

## 2011-04-28 NOTE — Assessment & Plan Note (Signed)
Atypical, normal ECG and exam Related to stress F/U stress echo

## 2011-05-08 ENCOUNTER — Ambulatory Visit (HOSPITAL_BASED_OUTPATIENT_CLINIC_OR_DEPARTMENT_OTHER): Payer: Medicare Other | Admitting: Radiology

## 2011-05-08 ENCOUNTER — Ambulatory Visit (HOSPITAL_COMMUNITY): Payer: Medicare Other | Attending: Cardiology | Admitting: Radiology

## 2011-05-08 DIAGNOSIS — R5383 Other fatigue: Secondary | ICD-10-CM | POA: Insufficient documentation

## 2011-05-08 DIAGNOSIS — R5381 Other malaise: Secondary | ICD-10-CM | POA: Insufficient documentation

## 2011-05-08 DIAGNOSIS — R0989 Other specified symptoms and signs involving the circulatory and respiratory systems: Secondary | ICD-10-CM

## 2011-05-08 DIAGNOSIS — R072 Precordial pain: Secondary | ICD-10-CM | POA: Insufficient documentation

## 2011-05-08 DIAGNOSIS — R0609 Other forms of dyspnea: Secondary | ICD-10-CM | POA: Insufficient documentation

## 2011-05-29 ENCOUNTER — Telehealth: Payer: Self-pay

## 2011-05-29 NOTE — Telephone Encounter (Signed)
Pt states her finger was swollen from yesterday and she has been trying to get her ring off. Pt states her finger is blue and swollen.  Called pt to ask if she had tried ice cold water or grease.  Pt states she has tried everything and has had no luck.    Per Dr. Cato Mulligan pt instructed to go to her jeweler and if they cannot get it off there to go to the ER.  Pt is aware.

## 2011-06-21 DIAGNOSIS — D485 Neoplasm of uncertain behavior of skin: Secondary | ICD-10-CM | POA: Diagnosis not present

## 2011-06-21 DIAGNOSIS — L708 Other acne: Secondary | ICD-10-CM | POA: Diagnosis not present

## 2011-06-21 DIAGNOSIS — L739 Follicular disorder, unspecified: Secondary | ICD-10-CM | POA: Diagnosis not present

## 2011-06-21 DIAGNOSIS — L219 Seborrheic dermatitis, unspecified: Secondary | ICD-10-CM | POA: Diagnosis not present

## 2011-06-28 ENCOUNTER — Encounter: Payer: Self-pay | Admitting: Gastroenterology

## 2011-06-28 DIAGNOSIS — F319 Bipolar disorder, unspecified: Secondary | ICD-10-CM | POA: Diagnosis not present

## 2011-07-18 ENCOUNTER — Encounter: Payer: Self-pay | Admitting: Gastroenterology

## 2011-08-08 DIAGNOSIS — F319 Bipolar disorder, unspecified: Secondary | ICD-10-CM | POA: Diagnosis not present

## 2011-08-15 DIAGNOSIS — F319 Bipolar disorder, unspecified: Secondary | ICD-10-CM | POA: Diagnosis not present

## 2011-08-16 DIAGNOSIS — L570 Actinic keratosis: Secondary | ICD-10-CM | POA: Diagnosis not present

## 2011-08-21 ENCOUNTER — Encounter: Payer: Self-pay | Admitting: Gastroenterology

## 2011-08-21 ENCOUNTER — Ambulatory Visit (INDEPENDENT_AMBULATORY_CARE_PROVIDER_SITE_OTHER): Payer: Medicare Other | Admitting: Gastroenterology

## 2011-08-21 DIAGNOSIS — K219 Gastro-esophageal reflux disease without esophagitis: Secondary | ICD-10-CM

## 2011-08-21 DIAGNOSIS — K6289 Other specified diseases of anus and rectum: Secondary | ICD-10-CM | POA: Diagnosis not present

## 2011-08-21 DIAGNOSIS — Z1231 Encounter for screening mammogram for malignant neoplasm of breast: Secondary | ICD-10-CM | POA: Diagnosis not present

## 2011-08-21 DIAGNOSIS — K5900 Constipation, unspecified: Secondary | ICD-10-CM | POA: Diagnosis not present

## 2011-08-21 DIAGNOSIS — K921 Melena: Secondary | ICD-10-CM | POA: Diagnosis not present

## 2011-08-21 MED ORDER — OMEPRAZOLE 20 MG PO CPDR: 20.0000 mg | DELAYED_RELEASE_CAPSULE | Freq: Every day | ORAL | Status: DC

## 2011-08-21 NOTE — Progress Notes (Signed)
Hitory of Present Illness: This is a 56 year old female who has had worsening problems with constipation over the past few years. She has a bowel movement about every 5 days associated with significant straining and hard stools. She will occasionally note small amounts bright red blood with bowel movements. She has had fleeting episodes of rectal pain and spasm that may occur once every 2-3 weeks. They frequently occur when she is walking or moving and resolve spontaneously within several seconds. She underwent colonoscopy in February 2000 showing only internal hemorrhoids.  She notes worsening reflux symptoms over the past several months with occasional difficulty swallowing large pills. She previously underwent upper endoscopy 2009 that showed mild gastritis. She was treated with omeprazole a time but no longer takes this. Denies weight loss, abdominal pain, diarrhea, change in stool caliber, melena,nausea, vomiting, chest pain.  Review of Systems: Pertinent positive and negative review of systems were noted in the above HPI section. All other review of systems were otherwise negative.  Current Medications, Allergies, Past Medical History, Past Surgical History, Family History and Social History were reviewed in [employer] record.  Physical Exam: General: Well developed , well nourished,  uses a walker, no acute distress Head: Normocephalic and atraumatic Eyes:  sclerae anicteric, EOMI Ears: Normal auditory acuity Mouth: No deformity or lesions Neck: Supple, no masses or thyromegaly Lungs: Clear throughout to auscultation Heart: Regular rate and rhythm; no murmurs, rubs or bruits Abdomen: Soft, non tender and non distended. No masses, hepatosplenomegaly or hernias noted. Normal Bowel sounds Rectal: Deferred to colonoscopy Musculoskeletal: Symmetrical with no gross deformities  Skin: No lesions on visible extremities Pulses:  Normal pulses noted Extremities: No clubbing,  cyanosis, edema or deformities noted Neurological: Alert oriented x 4, grossly nonfocal Cervical Nodes:  No significant cervical adenopathy Inguinal Nodes: No significant inguinal adenopathy Psychological:  Alert and cooperative. Normal mood and affect  Assessment and Recommendations:  1. Constipation and small volume hematochezia. I suspect she has rectal bleeding from hemorrhoids however colorectal neoplasms, proctitis and other disorders need to be excluded. Increase dietary fiber and water intake. Begin MiraLax 1-3 times daily titrated for adequate bowel movements. The risks, benefits, and alternatives to colonoscopy with possible biopsy and possible polypectomy were discussed with the patient and they consent to proceed.   2. GERD and dysphagia with large pills. Begin omeprazole 20 mg daily and standard antireflux measures. If her symptoms are not adequately controlled consider barium esophagram or repeat endoscopy.  3. Intermittent rectal pain. Possible proctalgia fugax. Her symptoms may be related to her chronic lower back problems with possible muscle spasm or nerve impingement. This may require further evaluation. Further evaluation with colonoscopy.  4. Obstructive sleep apnea  5. Bipolar disorder

## 2011-08-21 NOTE — Patient Instructions (Signed)
You have been scheduled for a colonoscopy with propofol. Please follow written instructions given to you at your visit today.  Please pick up your prep kit at the pharmacy within the next 1-3 days. Take Miralax over the counter mixing 17 grams in 8 oz of water 1-3 x daily for constipation. We have sent the following medications to your pharmacy for you to pick up at your convenience: Omeprazole 20 mg daily. Patient advised to avoid spicy, acidic, citrus, chocolate, mints, fruit and fruit juices.  Limit the intake of caffeine, alcohol and Soda.  Don't exercise too soon after eating.  Don't lie down within 3-4 hours of eating.  Elevate the head of your bed. cc: Birdie Sons, MD

## 2011-08-22 ENCOUNTER — Encounter: Payer: Self-pay | Admitting: Gastroenterology

## 2011-08-23 ENCOUNTER — Telehealth: Payer: Self-pay | Admitting: Gastroenterology

## 2011-08-23 MED ORDER — PEG-KCL-NACL-NASULF-NA ASC-C 100 G PO SOLR: 1.0000 | Freq: Once | ORAL | Status: DC

## 2011-08-23 NOTE — Telephone Encounter (Signed)
Sent the prescription for Movi prep to patient's pharmacy. Pt notified.

## 2011-08-31 DIAGNOSIS — F319 Bipolar disorder, unspecified: Secondary | ICD-10-CM | POA: Diagnosis not present

## 2011-09-06 DIAGNOSIS — M47817 Spondylosis without myelopathy or radiculopathy, lumbosacral region: Secondary | ICD-10-CM | POA: Diagnosis not present

## 2011-09-12 DIAGNOSIS — Z1151 Encounter for screening for human papillomavirus (HPV): Secondary | ICD-10-CM | POA: Diagnosis not present

## 2011-09-12 DIAGNOSIS — Z9189 Other specified personal risk factors, not elsewhere classified: Secondary | ICD-10-CM | POA: Diagnosis not present

## 2011-09-12 DIAGNOSIS — Z01419 Encounter for gynecological examination (general) (routine) without abnormal findings: Secondary | ICD-10-CM | POA: Diagnosis not present

## 2011-09-13 DIAGNOSIS — Z01419 Encounter for gynecological examination (general) (routine) without abnormal findings: Secondary | ICD-10-CM | POA: Diagnosis not present

## 2011-09-13 DIAGNOSIS — Z1151 Encounter for screening for human papillomavirus (HPV): Secondary | ICD-10-CM | POA: Diagnosis not present

## 2011-09-13 DIAGNOSIS — Z9189 Other specified personal risk factors, not elsewhere classified: Secondary | ICD-10-CM | POA: Diagnosis not present

## 2011-09-14 DIAGNOSIS — F319 Bipolar disorder, unspecified: Secondary | ICD-10-CM | POA: Diagnosis not present

## 2011-09-26 DIAGNOSIS — L570 Actinic keratosis: Secondary | ICD-10-CM | POA: Diagnosis not present

## 2011-10-03 ENCOUNTER — Ambulatory Visit (AMBULATORY_SURGERY_CENTER): Payer: Medicare Other | Admitting: Gastroenterology

## 2011-10-03 ENCOUNTER — Encounter: Payer: Self-pay | Admitting: Gastroenterology

## 2011-10-03 VITALS — BP 100/58 | HR 68 | Temp 98.5°F | Resp 18 | Ht 62.0 in | Wt 185.0 lb

## 2011-10-03 DIAGNOSIS — K921 Melena: Secondary | ICD-10-CM

## 2011-10-03 DIAGNOSIS — D126 Benign neoplasm of colon, unspecified: Secondary | ICD-10-CM

## 2011-10-03 DIAGNOSIS — K5900 Constipation, unspecified: Secondary | ICD-10-CM

## 2011-10-03 DIAGNOSIS — F411 Generalized anxiety disorder: Secondary | ICD-10-CM | POA: Diagnosis not present

## 2011-10-03 DIAGNOSIS — Z86010 Personal history of colonic polyps: Secondary | ICD-10-CM | POA: Diagnosis not present

## 2011-10-03 MED ORDER — SODIUM CHLORIDE 0.9 % IV SOLN: 500.0000 mL | INTRAVENOUS | Status: DC

## 2011-10-03 NOTE — Progress Notes (Signed)
Patient did not have preoperative order for IV antibiotic SSI prophylaxis. (G8918)  Patient did not experience any of the following events: a burn prior to discharge; a fall within the facility; wrong site/side/patient/procedure/implant event; or a hospital transfer or hospital admission upon discharge from the facility. (G8907)  

## 2011-10-03 NOTE — Op Note (Signed)
[facility] [address] [place], [place]  [address]  COLONOSCOPY PROCEDURE REPORT  PATIENT:  Brittany [name], Brittany [name]  MR#:  [id number] BIRTHDATE:  1956-02-14, 55 yrs. old  GENDER:  female ENDOSCOPIST:  Judie Petit T. Russella Dar, MD, [facility]  PROCEDURE DATE:  10/03/2011 PROCEDURE:  Colonoscopy with snare polypectomy ASA CLASS:  Class III INDICATIONS:  1) hematochezia  2) constipation MEDICATIONS:   MAC sedation, administered by CRNA, propofol (Diprivan) 300 mg IV DESCRIPTION OF PROCEDURE:   After the risks benefits and alternatives of the procedure were thoroughly explained, informed consent was obtained.  Digital rectal exam was performed and revealed no abnormalities.   The LB 180AL [id number] endoscope was introduced through the anus and advanced to the cecum, which was identified by both the appendix and ileocecal valve, without limitations.  The quality of the prep was adequate, using MoviPrep.  The instrument was then slowly withdrawn as the colon was fully examined. <<PROCEDUREIMAGES>> FINDINGS:  A sessile polyp was found in the sigmoid colon. It was 5 mm in size. Polyp was snared without cautery. Retrieval was successful.    Otherwise normal colonoscopy without other polyps, masses, vascular ectasias, or inflammatory changes.  Retroflexed views in the rectum revealed internal hemorrhoids, small.  The time to cecum =  4  minutes. The scope was then withdrawn (time = 10.75  min) from the patient and the procedure completed.  COMPLICATIONS:  None  ENDOSCOPIC IMPRESSION: 1) 5 mm sessile polyp in the sigmoid colon 2) Internal hemorrhoids  RECOMMENDATIONS: 1) Await pathology results 2) Miralax 1-3 times daily, titrate to need 3) If the polyp is adenomatous (pre-cancerous), repeat colonoscopy in 5 years. Otherwise follow colorectal cancer screening guidelines for "routine risk" patients with colonoscopy in 10 years.  Venita Lick. Russella Dar, MD, [facility]  n. eSIGNED:   Venita Lick. Maha Fischel at  10/03/2011 10:21 AM  [place], [place], [phone]

## 2011-10-03 NOTE — Patient Instructions (Signed)
YOU HAD AN ENDOSCOPIC PROCEDURE TODAY AT THE [facility]: Refer to the procedure report that was given to you for any specific questions about what was found during the examination.  If the procedure report does not answer your questions, please call your gastroenterologist to clarify.  If you requested that your care partner not be given the details of your procedure findings, then the procedure report has been included in a sealed envelope for you to review at your convenience later.  YOU SHOULD EXPECT: Some feelings of bloating in the abdomen. Passage of more gas than usual.  Walking can help get rid of the air that was put into your GI tract during the procedure and reduce the bloating. If you had a lower endoscopy (such as a colonoscopy or flexible sigmoidoscopy) you may notice spotting of blood in your stool or on the toilet paper. If you underwent a bowel prep for your procedure, then you may not have a normal bowel movement for a few days.  DIET: Your first meal following the procedure should be a light meal and then it is ok to progress to your normal diet.  A half-sandwich or bowl of soup is an example of a good first meal.  Heavy or fried foods are harder to digest and may make you feel nauseous or bloated.  Likewise meals heavy in dairy and vegetables can cause extra gas to form and this can also increase the bloating.  Drink plenty of fluids but you should avoid alcoholic beverages for 24 hours.  ACTIVITY: Your care partner should take you home directly after the procedure.  You should plan to take it easy, moving slowly for the rest of the day.  You can resume normal activity the day after the procedure however you should NOT DRIVE or use heavy machinery for 24 hours (because of the sedation medicines used during the test).    SYMPTOMS TO REPORT IMMEDIATELY: A gastroenterologist can be reached at any hour.  During normal business hours, 8:30 AM to 5:00 PM Monday through Friday,  call [phone].  After hours and on weekends, please call the GI answering service at [phone] who will take a message and have the physician on call contact you.   Following lower endoscopy (colonoscopy or flexible sigmoidoscopy):  Excessive amounts of blood in the stool  Significant tenderness or worsening of abdominal pains  Swelling of the abdomen that is new, acute  Fever of 100F or higher    FOLLOW UP: If any biopsies were taken you will be contacted by phone or by letter within the next 1-3 weeks.  Call your gastroenterologist if you have not heard about the biopsies in 3 weeks.  Our staff will call the home number listed on your records the next business day following your procedure to check on you and address any questions or concerns that you may have at that time regarding the information given to you following your procedure. This is a courtesy call and so if there is no answer at the home number and we have not heard from you through the emergency physician on call, we will assume that you have returned to your regular daily activities without incident.  SIGNATURES/CONFIDENTIALITY: You and/or your care partner have signed paperwork which will be entered into your electronic medical record.  These signatures attest to the fact that that the information above on your After Visit Summary has been reviewed and is understood.  Full responsibility of the confidentiality   of this discharge information lies with you and/or your care-partner.     

## 2011-10-04 ENCOUNTER — Telehealth: Payer: Self-pay | Admitting: *Deleted

## 2011-10-04 DIAGNOSIS — Z1389 Encounter for screening for other disorder: Secondary | ICD-10-CM | POA: Diagnosis not present

## 2011-10-04 DIAGNOSIS — Z1329 Encounter for screening for other suspected endocrine disorder: Secondary | ICD-10-CM | POA: Diagnosis not present

## 2011-10-04 DIAGNOSIS — Z0000 Encounter for general adult medical examination without abnormal findings: Secondary | ICD-10-CM | POA: Diagnosis not present

## 2011-10-04 DIAGNOSIS — Z13220 Encounter for screening for lipoid disorders: Secondary | ICD-10-CM | POA: Diagnosis not present

## 2011-10-04 DIAGNOSIS — Z130 Encounter for screening for diseases of the blood and blood-forming organs and certain disorders involving the immune mechanism: Secondary | ICD-10-CM | POA: Diagnosis not present

## 2011-10-04 NOTE — Telephone Encounter (Signed)
Message on machine said "Brittany [name]". No message left.

## 2011-10-06 DIAGNOSIS — F319 Bipolar disorder, unspecified: Secondary | ICD-10-CM | POA: Diagnosis not present

## 2011-10-09 ENCOUNTER — Encounter: Payer: Self-pay | Admitting: Gastroenterology

## 2011-10-10 DIAGNOSIS — M5137 Other intervertebral disc degeneration, lumbosacral region: Secondary | ICD-10-CM | POA: Diagnosis not present

## 2011-10-10 DIAGNOSIS — M47817 Spondylosis without myelopathy or radiculopathy, lumbosacral region: Secondary | ICD-10-CM | POA: Diagnosis not present

## 2011-10-10 DIAGNOSIS — M25559 Pain in unspecified hip: Secondary | ICD-10-CM | POA: Diagnosis not present

## 2011-10-18 DIAGNOSIS — F319 Bipolar disorder, unspecified: Secondary | ICD-10-CM | POA: Diagnosis not present

## 2011-11-06 DIAGNOSIS — Z79899 Other long term (current) drug therapy: Secondary | ICD-10-CM | POA: Diagnosis not present

## 2011-11-06 DIAGNOSIS — L03019 Cellulitis of unspecified finger: Secondary | ICD-10-CM | POA: Diagnosis not present

## 2011-11-07 DIAGNOSIS — L570 Actinic keratosis: Secondary | ICD-10-CM | POA: Diagnosis not present

## 2011-11-15 DIAGNOSIS — L608 Other nail disorders: Secondary | ICD-10-CM | POA: Diagnosis not present

## 2011-11-22 ENCOUNTER — Telehealth: Payer: Self-pay | Admitting: *Deleted

## 2011-11-22 NOTE — Telephone Encounter (Signed)
Confirmed 02/01/12 genetic appt w/ pt.  Brittany [name] at referring to make her aware.  Took paperwork to Clydie Braun.

## 2011-11-28 DIAGNOSIS — M545 Low back pain, unspecified: Secondary | ICD-10-CM | POA: Diagnosis not present

## 2011-12-01 DIAGNOSIS — M545 Low back pain, unspecified: Secondary | ICD-10-CM | POA: Diagnosis not present

## 2011-12-04 DIAGNOSIS — M545 Low back pain, unspecified: Secondary | ICD-10-CM | POA: Diagnosis not present

## 2011-12-05 DIAGNOSIS — M545 Low back pain, unspecified: Secondary | ICD-10-CM | POA: Diagnosis not present

## 2012-01-08 DIAGNOSIS — M5137 Other intervertebral disc degeneration, lumbosacral region: Secondary | ICD-10-CM | POA: Diagnosis not present

## 2012-01-08 DIAGNOSIS — M47817 Spondylosis without myelopathy or radiculopathy, lumbosacral region: Secondary | ICD-10-CM | POA: Diagnosis not present

## 2012-01-20 ENCOUNTER — Encounter (HOSPITAL_COMMUNITY): Payer: Self-pay | Admitting: Emergency Medicine

## 2012-01-20 ENCOUNTER — Emergency Department (HOSPITAL_COMMUNITY): Payer: Medicare Other

## 2012-01-20 ENCOUNTER — Emergency Department (HOSPITAL_COMMUNITY)
Admission: EM | Admit: 2012-01-20 | Discharge: 2012-01-20 | Disposition: A | Discharge status: Home / Self Care | Payer: Medicare Other | Attending: Emergency Medicine | Admitting: Emergency Medicine

## 2012-01-20 DIAGNOSIS — W19XXXA Unspecified fall, initial encounter: Secondary | ICD-10-CM | POA: Insufficient documentation

## 2012-01-20 DIAGNOSIS — K219 Gastro-esophageal reflux disease without esophagitis: Secondary | ICD-10-CM | POA: Diagnosis not present

## 2012-01-20 DIAGNOSIS — M129 Arthropathy, unspecified: Secondary | ICD-10-CM | POA: Insufficient documentation

## 2012-01-20 DIAGNOSIS — S71009A Unspecified open wound, unspecified hip, initial encounter: Secondary | ICD-10-CM | POA: Diagnosis not present

## 2012-01-20 DIAGNOSIS — S51809A Unspecified open wound of unspecified forearm, initial encounter: Secondary | ICD-10-CM | POA: Diagnosis not present

## 2012-01-20 DIAGNOSIS — F319 Bipolar disorder, unspecified: Secondary | ICD-10-CM | POA: Insufficient documentation

## 2012-01-20 DIAGNOSIS — E785 Hyperlipidemia, unspecified: Secondary | ICD-10-CM | POA: Insufficient documentation

## 2012-01-20 DIAGNOSIS — G4733 Obstructive sleep apnea (adult) (pediatric): Secondary | ICD-10-CM | POA: Insufficient documentation

## 2012-01-20 DIAGNOSIS — Z87891 Personal history of nicotine dependence: Secondary | ICD-10-CM | POA: Insufficient documentation

## 2012-01-20 DIAGNOSIS — S71109A Unspecified open wound, unspecified thigh, initial encounter: Secondary | ICD-10-CM | POA: Diagnosis not present

## 2012-01-20 DIAGNOSIS — M25529 Pain in unspecified elbow: Secondary | ICD-10-CM | POA: Diagnosis not present

## 2012-01-20 DIAGNOSIS — IMO0002 Reserved for concepts with insufficient information to code with codable children: Secondary | ICD-10-CM

## 2012-01-20 DIAGNOSIS — Z1810 Retained metal fragments, unspecified: Secondary | ICD-10-CM | POA: Diagnosis not present

## 2012-01-20 MED ORDER — CEPHALEXIN 500 MG PO CAPS: 500.0000 mg | ORAL_CAPSULE | Freq: Four times a day (QID) | ORAL | Status: AC

## 2012-01-20 NOTE — ED Provider Notes (Signed)
Medical screening examination/treatment/procedure(s) were performed by non-physician practitioner and as supervising physician I was immediately available for consultation/collaboration.   Lyanne Co, MD 01/20/12 [phone]

## 2012-01-20 NOTE — ED Notes (Signed)
Patient transported to X-ray 

## 2012-01-20 NOTE — ED Provider Notes (Signed)
History     CSN: [id number]  Arrival date & time 01/20/12  1358   First MD Initiated Contact with Patient 01/20/12 1405      Chief Complaint  Patient presents with  . Extremity Laceration    (Consider location/radiation/quality/duration/timing/severity/associated sxs/prior treatment) HPI  56 year old female in no acute distress complaining of laceration to right elbow and right thigh pain status post slip and fall several hours ago. Patient ambulates with a walker secondary to chronic back pain. Patient was ambulating over and even wound with gravel on it and had a mechanical fall. Denies any head trauma or LOC. Pain is mild at 4/10. Patient states she can ambulate without difficulty Patient states bleeding is controlled. Last shot was within the last 5 years.  Past Medical History  Diagnosis Date  . HYPERLIPIDEMIA   . HEMORRHOIDS-INTERNAL   . URI   . OBSTRUCTIVE SLEEP APNEA   . DISORDER, BIPOLAR NEC   . DEPRESSION   . ALOPECIA   . ALLERGIC RHINITIS   . ACNE NEC   . Anal fissure   . Anxiety   . Arthritis   . Chronic headaches   . Depression   . Bowel obstruction   . GERD (gastroesophageal reflux disease)     Past Surgical History  Procedure Date  . Knee surgery     bilateral  . Breast lumpectomy   . Radical hysterectomy   . Tonsillectomy     Family History  Problem Relation Age of Onset  . Breast cancer    . Lung cancer    . Colon cancer Maternal Uncle   . Colon polyps Maternal Uncle   . Other Paternal Aunt     x 2, blood clotting disorder  . Heart disease Sister   . Irritable bowel syndrome Cousin   . Kidney disease Maternal Uncle   . Esophageal cancer Paternal Uncle   . Rectal cancer Neg Hx   . Stomach cancer Neg Hx     History  Substance Use Topics  . Smoking status: Former Smoker    Types: Cigarettes  . Smokeless tobacco: Never Used  . Alcohol Use: No    OB History    Grav Para Term Preterm Abortions TAB SAB Ect Mult Living                    Review of Systems  Constitutional: Negative for fever.  HENT: Negative for neck pain.   Gastrointestinal: Negative for nausea, vomiting and abdominal pain.  Genitourinary: Negative for dysuria and difficulty urinating.  Musculoskeletal: Negative for myalgias.  Skin: Positive for wound.  Neurological: Negative for weakness and numbness.  All other systems reviewed and are negative.    Allergies  Hydrocodone  Home Medications   Current Outpatient Rx  Name Route Sig Dispense Refill  . ASPIRIN 325 MG PO TBEC Oral Take 325 mg by mouth 2 (two) times daily.      Marland Kitchen CETIRIZINE HCL 10 MG PO TABS Oral Take 10 mg by mouth daily.    Marland Kitchen GLUCOSAMINE CHONDR COMPLEX PO Oral Take 1 capsule by mouth daily.    Marland Kitchen LAMOTRIGINE 150 MG PO TABS Oral Take 150 mg by mouth daily.      Marland Kitchen LITHIUM CARBONATE ER 450 MG PO TBCR Oral Take 450 mg by mouth 2 (two) times daily.      . ADULT MULTIVITAMIN W/MINERALS CH Oral Take 1 tablet by mouth daily.    . SERTRALINE HCL 50 MG PO TABS Oral Take  50 mg by mouth daily.    . CEPHALEXIN 500 MG PO CAPS Oral Take 1 capsule (500 mg total) by mouth 4 (four) times daily. 40 capsule 0    BP 112/84  Pulse 77  Temp 98 F (36.7 C) (Oral)  Resp 12  SpO2 100%  Physical Exam  Vitals reviewed. Constitutional: She is oriented to person, place, and time. She appears well-developed and well-nourished. No distress.  HENT:  Head: Normocephalic and atraumatic.  Right Ear: External ear normal.  Left Ear: External ear normal.  Mouth/Throat: Oropharynx is clear and moist.       No signs of head trauma  Eyes: Conjunctivae and EOM are normal. Pupils are equal, round, and reactive to light.  Neck: Normal range of motion.       No midline tenderness  Cardiovascular: Normal rate.   Pulmonary/Chest: Effort normal. She exhibits no tenderness.  Abdominal: Soft. Bowel sounds are normal. There is no tenderness.  Musculoskeletal: Normal range of motion.       Patient has full range of  motion to right hip and knee neurovascularly intact. Patient also has full range of motion to right elbow and shoulder. Distal sensation and pulses intact. Right thigh shows no extensive contusion or tension with intact distal pulses.  Neurological: She is alert and oriented to person, place, and time.       Patient and with a coordinated gait: mild antalgia secondary to pain  Skin: She is not diaphoretic.          Abrasion to right proximal forearm. Abrasions are approximately 10 x 15 cm with a 1 cm partial-thickness laceration and a 1 cm full-thickness laceration with no involvement of musculature ligaments or joint capsule. Bleeding controlled. Wound shows minor contamination with dirt  Psychiatric: She has a normal mood and affect.    ED Course  Procedures (including critical care time)  LACERATION REPAIR Performed by: Wynetta Emery Authorized by: Wynetta Emery Consent: Verbal consent obtained. Risks and benefits: risks, benefits and alternatives were discussed Consent given by: patient Patient identity confirmed: provided demographic data Prepped and Draped in normal sterile fashion Wound explored  Laceration Location: Right proximal forearm  Laceration Length: 2 lacerations each 1cm  No Foreign Bodies seen or palpated  Anesthesia: local infiltration  Local anesthetic: lidocaine 2 % without epinephrine  Anesthetic total: 10 ml  Irrigation method: syringe with low pressure  Amount of cleaning: 200 mL of irrigation used for effective cleaning of moderately contaminated wounds.   Skin closure: 3-0 polypropylene   Number of sutures: 5   Technique: Simple interrupted   Patient tolerance: Patient tolerated the procedure well with no immediate complications.   Labs Reviewed - No data to display Dg Elbow Complete Right  01/20/2012  *RADIOLOGY REPORT*  Clinical Data: History of trauma from a fall.  Elbow pain.  RIGHT ELBOW - COMPLETE 3+ VIEW  Comparison: No priors.   Findings: There is soft tissue irregularity posterior to the proximal radius and ulna, compatible with a soft tissue contusion. There appears to be a small retained radiopaque foreign body in the soft tissues.  The bones of the right elbow are unremarkable. Specifically, no evidence of acute displaced fracture, subluxation or dislocation.  IMPRESSION: 1.  Negative for acute bony trauma. 2.  Soft tissue contusion in the posterior aspect of the proximal forearm with tiny retained radiopaque foreign body in the soft tissues.  Original Report Authenticated By: Florencia Reasons, M.D.   Dg Femur Right  01/20/2012  *  RADIOLOGY REPORT*  Clinical Data: Extremity laceration  RIGHT FEMUR - 2 VIEW  Comparison: None  Findings: There is no evidence of fracture or dislocation.  There is no evidence of arthropathy or other focal bone abnormality. Soft tissues are unremarkable.  IMPRESSION: No acute findings.  Original Report Authenticated By: Rosealee Albee, M.D.     1. Laceration       MDM  56 year old female with no signs of trauma complaining of mechanical slip and fall with pain to right side.        Wynetta Emery, PA-C 01/20/12 1656

## 2012-01-20 NOTE — ED Notes (Signed)
To Ed via private vehicle with laceration to right elbow, right thigh pain, fell while walking with walker, walker hit gravel- no LOC, laceration to right elbow approx 1", open- bleeding controlled at present. Pain in right thigh- limping with ambulation. Landed on right hip.

## 2012-01-23 DIAGNOSIS — L259 Unspecified contact dermatitis, unspecified cause: Secondary | ICD-10-CM | POA: Diagnosis not present

## 2012-01-24 ENCOUNTER — Encounter (HOSPITAL_COMMUNITY): Payer: Self-pay | Admitting: Emergency Medicine

## 2012-01-24 ENCOUNTER — Emergency Department (HOSPITAL_COMMUNITY)
Admission: EM | Admit: 2012-01-24 | Discharge: 2012-01-24 | Disposition: A | Discharge status: Home / Self Care | Payer: Medicare Other | Attending: Emergency Medicine | Admitting: Emergency Medicine

## 2012-01-24 DIAGNOSIS — F411 Generalized anxiety disorder: Secondary | ICD-10-CM | POA: Diagnosis not present

## 2012-01-24 DIAGNOSIS — F319 Bipolar disorder, unspecified: Secondary | ICD-10-CM | POA: Insufficient documentation

## 2012-01-24 DIAGNOSIS — L0390 Cellulitis, unspecified: Secondary | ICD-10-CM

## 2012-01-24 DIAGNOSIS — Z87891 Personal history of nicotine dependence: Secondary | ICD-10-CM | POA: Diagnosis not present

## 2012-01-24 DIAGNOSIS — K219 Gastro-esophageal reflux disease without esophagitis: Secondary | ICD-10-CM | POA: Insufficient documentation

## 2012-01-24 DIAGNOSIS — Z23 Encounter for immunization: Secondary | ICD-10-CM | POA: Diagnosis not present

## 2012-01-24 DIAGNOSIS — IMO0002 Reserved for concepts with insufficient information to code with codable children: Secondary | ICD-10-CM | POA: Insufficient documentation

## 2012-01-24 DIAGNOSIS — E785 Hyperlipidemia, unspecified: Secondary | ICD-10-CM | POA: Diagnosis not present

## 2012-01-24 MED ORDER — SULFAMETHOXAZOLE-TRIMETHOPRIM 800-160 MG PO TABS: 2.0000 | ORAL_TABLET | Freq: Two times a day (BID) | ORAL | Status: AC

## 2012-01-24 MED ORDER — BACITRACIN ZINC 500 UNIT/GM EX OINT
1.0000 "application " | TOPICAL_OINTMENT | Freq: Two times a day (BID) | CUTANEOUS | Status: DC
  Administered 2012-01-24: 1 via TOPICAL
  Filled 2012-01-24 (×2): qty 0.9

## 2012-01-24 MED ORDER — TETANUS-DIPHTH-ACELL PERTUSSIS 5-2.5-18.5 LF-MCG/0.5 IM SUSP
0.5000 mL | Freq: Once | INTRAMUSCULAR | Status: AC
  Administered 2012-01-24: 0.5 mL via INTRAMUSCULAR
  Filled 2012-01-24: qty 0.5

## 2012-01-24 NOTE — ED Provider Notes (Signed)
History     CSN: [id number]  Arrival date & time 01/24/12  0944   First MD Initiated Contact with Patient 01/24/12 1016      Chief Complaint  Patient presents with  . Arm Pain    (Consider location/radiation/quality/duration/timing/severity/associated sxs/prior treatment) HPI Comments: Patient who was seen in ED on 01/20/12 following mechanical fall with abrasion and laceration to right elbow presents today with pain and swelling, yellow discharge coming from the wound.  States she has been cleaning it in the morning with alcohol and/or hydrogen peroxide, then putting neosporin on it and leaving it uncovered until nighttime, when she repeats this procedure but then covers it with a bandage.  Denies fevers, chills.  Is taking keflex.    Patient is a 56 y.o. female presenting with arm pain. The history is provided by the patient and medical records.  Arm Pain Pertinent negatives include no chills, fever, numbness or weakness.    Past Medical History  Diagnosis Date  . HYPERLIPIDEMIA   . HEMORRHOIDS-INTERNAL   . URI   . OBSTRUCTIVE SLEEP APNEA   . DISORDER, BIPOLAR NEC   . DEPRESSION   . ALOPECIA   . ALLERGIC RHINITIS   . ACNE NEC   . Anal fissure   . Anxiety   . Arthritis   . Chronic headaches   . Depression   . Bowel obstruction   . GERD (gastroesophageal reflux disease)     Past Surgical History  Procedure Date  . Knee surgery     bilateral  . Breast lumpectomy   . Radical hysterectomy   . Tonsillectomy     Family History  Problem Relation Age of Onset  . Breast cancer    . Lung cancer    . Colon cancer Maternal Uncle   . Colon polyps Maternal Uncle   . Other Paternal Aunt     x 2, blood clotting disorder  . Heart disease Sister   . Irritable bowel syndrome Cousin   . Kidney disease Maternal Uncle   . Esophageal cancer Paternal Uncle   . Rectal cancer Neg Hx   . Stomach cancer Neg Hx     History  Substance Use Topics  . Smoking status: Former Smoker      Types: Cigarettes  . Smokeless tobacco: Never Used  . Alcohol Use: No    OB History    Grav Para Term Preterm Abortions TAB SAB Ect Mult Living                  Review of Systems  Constitutional: Negative for fever and chills.  Skin: Positive for color change and wound. Negative for pallor.  Neurological: Negative for weakness and numbness.    Allergies  Hydrocodone  Home Medications   Current Outpatient Rx  Name Route Sig Dispense Refill  . ASPIRIN 325 MG PO TBEC Oral Take 325 mg by mouth 2 (two) times daily.      . CEPHALEXIN 500 MG PO CAPS Oral Take 1 capsule (500 mg total) by mouth 4 (four) times daily. 40 capsule 0  . CETIRIZINE HCL 10 MG PO TABS Oral Take 10 mg by mouth daily.    Marland Kitchen GLUCOSAMINE CHONDR COMPLEX PO Oral Take 1 capsule by mouth daily.    . IBUPROFEN 200 MG PO TABS Oral Take 400 mg by mouth every 8 (eight) hours as needed. For pain.    Marland Kitchen LAMOTRIGINE 150 MG PO TABS Oral Take 150 mg by mouth daily.      Marland Kitchen  LITHIUM CARBONATE ER 450 MG PO TBCR Oral Take 450 mg by mouth 2 (two) times daily.      . ADULT MULTIVITAMIN W/MINERALS CH Oral Take 1 tablet by mouth daily.    . SERTRALINE HCL 50 MG PO TABS Oral Take 50 mg by mouth daily.      BP 112/78  Pulse 86  Temp 98.9 F (37.2 C) (Oral)  Resp 20  SpO2 98%  Physical Exam  Nursing note and vitals reviewed. Constitutional: She appears well-developed and well-nourished. No distress.  HENT:  Head: Normocephalic and atraumatic.  Neck: Neck supple.  Pulmonary/Chest: Effort normal.  Musculoskeletal:       Right elbow: She exhibits normal range of motion.       Right hand: normal sensation noted. Normal strength noted.       Right arm: distal pulses intact, sensation intact, strength 5/5, full AROM of right elbow without pain.    Neurological: She is alert.  Skin: Abrasion and laceration noted. She is not diaphoretic.          Erythema and mild edema, warmth surrounding abrasion, mostly proximal to abrasion.   Nontender.      ED Course  Procedures (including critical care time)  Labs Reviewed - No data to display No results found.  11:19 AM I have drawn line around erythema proximal to wound.  No distinct area of erythema or demarcation distal to wound.    1. Cellulitis       MDM  Pt with recent repair of abrasion/laceration to right forearm after mechanical fall.  Pt now with area of cellulitis around wound.  Reports of purulent discharge.  Pt is currently on keflex, I have added bactrim.  Erythema marked by me.  Sutures left intact as it has only been 4 days and there is no fluctuance or purulence from beneath the suture line.  Pt is afebrile, nontoxic.  Discussed proper wound care with patient, pt to follow up with PCP or ED in two days for recheck.  Pt given return precautions.  Pt verbalizes understanding and agrees with plan.           [place], [place] 01/24/12 1534

## 2012-01-24 NOTE — ED Notes (Signed)
Pt presenting to ed with c/o possible infection to right arm pt states she was seen here on Sunday and noticed yesterday that her right arm was more swollen and red pt denies fever and chills but states she has hot flashes so it's hard to tell.

## 2012-01-25 NOTE — ED Provider Notes (Signed)
Medical screening examination/treatment/procedure(s) were performed by non-physician practitioner and as supervising physician I was immediately available for consultation/collaboration.   Minoru Chap, MD 01/25/12 2338 

## 2012-01-26 ENCOUNTER — Encounter: Payer: Self-pay | Admitting: Family

## 2012-01-26 ENCOUNTER — Ambulatory Visit (INDEPENDENT_AMBULATORY_CARE_PROVIDER_SITE_OTHER): Payer: Medicare Other | Admitting: Family

## 2012-01-26 VITALS — BP 118/80 | HR 94 | Wt 187.0 lb

## 2012-01-26 DIAGNOSIS — S41109A Unspecified open wound of unspecified upper arm, initial encounter: Secondary | ICD-10-CM | POA: Diagnosis not present

## 2012-01-26 DIAGNOSIS — L0291 Cutaneous abscess, unspecified: Secondary | ICD-10-CM

## 2012-01-26 DIAGNOSIS — L0390 Cellulitis, unspecified: Secondary | ICD-10-CM

## 2012-01-26 DIAGNOSIS — S41111A Laceration without foreign body of right upper arm, initial encounter: Secondary | ICD-10-CM

## 2012-01-26 MED ORDER — ONDANSETRON HCL 8 MG PO TABS: 8.0000 mg | ORAL_TABLET | Freq: Three times a day (TID) | ORAL | Status: AC | PRN

## 2012-01-26 MED ORDER — DOXYCYCLINE HYCLATE 100 MG PO TABS: 100.0000 mg | ORAL_TABLET | Freq: Two times a day (BID) | ORAL | Status: AC

## 2012-01-26 NOTE — Progress Notes (Signed)
Subjective:    Patient ID: Brittany [name], female    DOB: Mar 05, 1956, 56 y.o.   MRN: [id number]  HPI 56 year old [place] American female, patient of Dr. Cato Mulligan in today with a laceration on her right posterior forearm. She was seen in the emergency department 4 days ago due to a fall on concrete. 2 days later, but we'll became red, swollen, tender to touch. She returned to the emergency department and was put on Bactrim. The redness has improved. However, she is not tolerating the Bactrim well. Complaints of nausea.   Review of Systems  Constitutional: Negative.   Respiratory: Negative.   Cardiovascular: Negative.   Musculoskeletal: Negative.   Skin: Positive for color change and wound.       Right forearm red, but improved. No drainage or discharge.  Psychiatric/Behavioral: Negative.    Past Medical History  Diagnosis Date  . HYPERLIPIDEMIA   . HEMORRHOIDS-INTERNAL   . URI   . OBSTRUCTIVE SLEEP APNEA   . DISORDER, BIPOLAR NEC   . DEPRESSION   . ALOPECIA   . ALLERGIC RHINITIS   . ACNE NEC   . Anal fissure   . Anxiety   . Arthritis   . Chronic headaches   . Depression   . Bowel obstruction   . GERD (gastroesophageal reflux disease)     History   Social History  . Marital Status: Married    Spouse Name: N/A    Number of Children: 4  . Years of Education: N/A   Occupational History  . disabled    Social History Main Topics  . Smoking status: Former Smoker    Types: Cigarettes  . Smokeless tobacco: Never Used  . Alcohol Use: No  . Drug Use: No  . Sexually Active: Not on file   Other Topics Concern  . Not on file   Social History Narrative  . No narrative on file    Past Surgical History  Procedure Date  . Knee surgery     bilateral  . Breast lumpectomy   . Radical hysterectomy   . Tonsillectomy     Family History  Problem Relation Age of Onset  . Breast cancer    . Lung cancer    . Colon cancer Maternal Uncle   . Colon polyps Maternal Uncle     . Other Paternal Aunt     x 2, blood clotting disorder  . Heart disease Sister   . Irritable bowel syndrome Cousin   . Kidney disease Maternal Uncle   . Esophageal cancer Paternal Uncle   . Rectal cancer Neg Hx   . Stomach cancer Neg Hx     Allergies  Allergen Reactions  . Hydrocodone     REACTION: GI upset    Current Outpatient Prescriptions on File Prior to Visit  Medication Sig Dispense Refill  . aspirin 325 MG EC tablet Take 325 mg by mouth 2 (two) times daily.        . cephALEXin (KEFLEX) 500 MG capsule Take 1 capsule (500 mg total) by mouth 4 (four) times daily.  40 capsule  0  . cetirizine (ZYRTEC) 10 MG tablet Take 10 mg by mouth daily.      . Glucosamine-Chondroitin (GLUCOSAMINE CHONDR COMPLEX PO) Take 1 capsule by mouth daily.      Marland Kitchen ibuprofen (ADVIL,MOTRIN) 200 MG tablet Take 400 mg by mouth every 8 (eight) hours as needed. For pain.      Marland Kitchen lamoTRIgine (LAMICTAL) 150 MG tablet Take 150 mg  by mouth daily.        Marland Kitchen lithium carbonate (ESKALITH) 450 MG CR tablet Take 450 mg by mouth 2 (two) times daily.        . Multiple Vitamin (MULTIVITAMIN WITH MINERALS) TABS Take 1 tablet by mouth daily.      . sertraline (ZOLOFT) 50 MG tablet Take 50 mg by mouth daily.      Marland Kitchen sulfamethoxazole-trimethoprim (SEPTRA DS) 800-160 MG per tablet Take 2 tablets by mouth 2 (two) times daily.  28 tablet  0    BP 118/80  Pulse 94  Wt 187 lb (84.823 kg)  SpO2 99%chart    Objective:   Physical Exam  Constitutional: She is oriented to person, place, and time. She appears well-developed and well-nourished.  Neck: Normal range of motion. Neck supple.  Cardiovascular: Normal rate, regular rhythm and normal heart sounds.   Pulmonary/Chest: Effort normal and breath sounds normal.  Neurological: She is alert and oriented to person, place, and time.  Skin: Skin is warm and dry.       Laceration noted to the right posterior forearm. No drainage or discharge. Area is still red and mildly tender to  touch. Sutures do not appear to be ready to be removed.  Psychiatric: She has a normal mood and affect.          Assessment & Plan:  Assessment: Cellulitis, laceration right arm  Plan: Doxycycline 100 mg twice a day everyday. Zofran 8 mg when necessary nausea. Patient call the office symptoms worsen or persist. We'll bring her back for recheck in one week to have the sutures removed and sooner when necessary.

## 2012-01-26 NOTE — Patient Instructions (Addendum)

## 2012-01-30 DIAGNOSIS — L570 Actinic keratosis: Secondary | ICD-10-CM | POA: Diagnosis not present

## 2012-01-31 ENCOUNTER — Ambulatory Visit (INDEPENDENT_AMBULATORY_CARE_PROVIDER_SITE_OTHER): Payer: Medicare Other | Admitting: Family

## 2012-01-31 ENCOUNTER — Encounter: Payer: Self-pay | Admitting: Family

## 2012-01-31 VITALS — BP 116/72 | HR 93 | Temp 98.5°F

## 2012-01-31 DIAGNOSIS — Z4802 Encounter for removal of sutures: Secondary | ICD-10-CM | POA: Diagnosis not present

## 2012-01-31 NOTE — Progress Notes (Signed)
Subjective:    Patient ID: Brittany [name], female    DOB: [birth year], 56 y.o.   MRN: [id number]  HPI  56 year old [place] American female, patient of Dr. Cato Mulligan is in today for suture removal. She has 5 sutures in her right forearm as a result of a fall. She's doing well.  Review of Systems  Constitutional: Negative.   Skin: Negative.   Psychiatric/Behavioral: Negative.    Past Medical History  Diagnosis Date  . HYPERLIPIDEMIA   . HEMORRHOIDS-INTERNAL   . URI   . OBSTRUCTIVE SLEEP APNEA   . DISORDER, BIPOLAR NEC   . DEPRESSION   . ALOPECIA   . ALLERGIC RHINITIS   . ACNE NEC   . Anal fissure   . Anxiety   . Arthritis   . Chronic headaches   . Depression   . Bowel obstruction   . GERD (gastroesophageal reflux disease)     History   Social History  . Marital Status: Married    Spouse Name: N/A    Number of Children: 4  . Years of Education: N/A   Occupational History  . disabled    Social History Main Topics  . Smoking status: Former Smoker    Types: Cigarettes  . Smokeless tobacco: Never Used  . Alcohol Use: No  . Drug Use: No  . Sexually Active: Not on file   Other Topics Concern  . Not on file   Social History Narrative  . No narrative on file    Past Surgical History  Procedure Date  . Knee surgery     bilateral  . Breast lumpectomy   . Radical hysterectomy   . Tonsillectomy     Family History  Problem Relation Age of Onset  . Breast cancer    . Lung cancer    . Colon cancer Maternal Uncle   . Colon polyps Maternal Uncle   . Other Paternal Aunt     x 2, blood clotting disorder  . Heart disease Sister   . Irritable bowel syndrome Cousin   . Kidney disease Maternal Uncle   . Esophageal cancer Paternal Uncle   . Rectal cancer Neg Hx   . Stomach cancer Neg Hx     Allergies  Allergen Reactions  . Hydrocodone     REACTION: GI upset    Current Outpatient Prescriptions on File Prior to Visit  Medication Sig Dispense Refill  .  aspirin 325 MG EC tablet Take 325 mg by mouth 2 (two) times daily.        . cephALEXin (KEFLEX) 500 MG capsule Take 1 capsule (500 mg total) by mouth 4 (four) times daily.  40 capsule  0  . cetirizine (ZYRTEC) 10 MG tablet Take 10 mg by mouth daily.      Marland Kitchen doxycycline (VIBRA-TABS) 100 MG tablet Take 1 tablet (100 mg total) by mouth 2 (two) times daily.  14 tablet  0  . Glucosamine-Chondroitin (GLUCOSAMINE CHONDR COMPLEX PO) Take 1 capsule by mouth daily.      Marland Kitchen ibuprofen (ADVIL,MOTRIN) 200 MG tablet Take 400 mg by mouth every 8 (eight) hours as needed. For pain.      Marland Kitchen lamoTRIgine (LAMICTAL) 150 MG tablet Take 150 mg by mouth daily.        Marland Kitchen lithium carbonate (ESKALITH) 450 MG CR tablet Take 450 mg by mouth 2 (two) times daily.        . Multiple Vitamin (MULTIVITAMIN WITH MINERALS) TABS Take 1 tablet by mouth daily.      Marland Kitchen  ondansetron (ZOFRAN) 8 MG tablet Take 1 tablet (8 mg total) by mouth every 8 (eight) hours as needed for nausea.  20 tablet  0  . sertraline (ZOLOFT) 50 MG tablet Take 50 mg by mouth daily.      Marland Kitchen sulfamethoxazole-trimethoprim (SEPTRA DS) 800-160 MG per tablet Take 2 tablets by mouth 2 (two) times daily.  28 tablet  0    BP 116/72  Pulse 93  Temp 98.5 F (36.9 C) (Oral)  SpO2 98%chart    Objective:   Physical Exam  Constitutional: She appears well-developed and well-nourished.  Cardiovascular: Normal rate, regular rhythm and normal heart sounds.   Pulmonary/Chest: Effort normal and breath sounds normal.  Skin: Skin is warm and dry.       Right forearm, posteriorly has 5 sutures intact. As well approximated. No redness, drainage or discharge.  Psychiatric: She has a normal mood and affect.          Assessment & Plan:  Assessment: Suture removal  Plan: Recheck as scheduled and when necessary.

## 2012-02-01 ENCOUNTER — Encounter: Payer: Medicare Other | Admitting: Genetic Counselor

## 2012-02-01 ENCOUNTER — Telehealth: Payer: Self-pay | Admitting: *Deleted

## 2012-02-01 ENCOUNTER — Other Ambulatory Visit: Payer: Medicare Other | Admitting: Lab

## 2012-02-01 NOTE — Telephone Encounter (Signed)
patient called and requested a new date and time confirmed over the phone the new date an time

## 2012-02-07 DIAGNOSIS — F319 Bipolar disorder, unspecified: Secondary | ICD-10-CM | POA: Diagnosis not present

## 2012-03-15 ENCOUNTER — Telehealth: Payer: Self-pay | Admitting: Genetic Counselor

## 2012-03-15 NOTE — Telephone Encounter (Signed)
Called to r/s her 10/3 appointment.  Asked her to CB.

## 2012-03-19 DIAGNOSIS — L570 Actinic keratosis: Secondary | ICD-10-CM | POA: Diagnosis not present

## 2012-03-21 ENCOUNTER — Other Ambulatory Visit: Payer: Medicare Other | Admitting: Lab

## 2012-03-21 ENCOUNTER — Encounter: Payer: Medicare Other | Admitting: Genetic Counselor

## 2012-03-25 ENCOUNTER — Telehealth: Payer: Self-pay | Admitting: *Deleted

## 2012-03-25 NOTE — Telephone Encounter (Signed)
Left message for pt to return my call to schedule a genetic appt. 

## 2012-04-04 ENCOUNTER — Telehealth: Payer: Self-pay | Admitting: *Deleted

## 2012-04-04 NOTE — Telephone Encounter (Signed)
Left message for pt to return my call so I can schedule a genetic appt.  

## 2012-04-05 ENCOUNTER — Telehealth: Payer: Self-pay | Admitting: *Deleted

## 2012-04-05 NOTE — Telephone Encounter (Signed)
Confirmed 06/27/12 genetic appt w/ pt.

## 2012-04-15 ENCOUNTER — Encounter: Payer: Self-pay | Admitting: Family Medicine

## 2012-04-15 ENCOUNTER — Ambulatory Visit (INDEPENDENT_AMBULATORY_CARE_PROVIDER_SITE_OTHER): Payer: Medicare Other | Admitting: Family Medicine

## 2012-04-15 VITALS — BP 112/76 | HR 85 | Temp 98.4°F | Wt 187.0 lb

## 2012-04-15 DIAGNOSIS — R51 Headache: Secondary | ICD-10-CM | POA: Diagnosis not present

## 2012-04-15 DIAGNOSIS — G8929 Other chronic pain: Secondary | ICD-10-CM

## 2012-04-15 MED ORDER — METHYLPREDNISOLONE ACETATE 80 MG/ML IJ SUSP
40.0000 mg | Freq: Once | INTRAMUSCULAR | Status: AC
  Administered 2012-04-15: 40 mg via INTRAMUSCULAR

## 2012-04-15 MED ORDER — KETOROLAC TROMETHAMINE 60 MG/2ML IM SOLN
30.0000 mg | Freq: Once | INTRAMUSCULAR | Status: AC
  Administered 2012-04-15: 30 mg via INTRAMUSCULAR

## 2012-04-15 NOTE — Patient Instructions (Addendum)
-  please use topical menthol containing product such as icy hot on head - do not get close to eyes  -can use ibuprofen or tylenol for occasional headaches but not more then 1x per week  -keep a journal of food, drink and headaches to take to the headache clinic

## 2012-04-15 NOTE — Progress Notes (Signed)
No chief complaint on file.   HPI:  Chronic headaches: -for a number of years and had seen headache specialist in the past and has follow up in December -has tried Topamax, stopped this several years ago -has had a migraine for 3 days - very nervous about all types of medication reactions with her bipolar medications -headaches unchanged -denies: fevers, chills, weakness, visual changes -currently taking: nothing  ROS: See pertinent positives and negatives per HPI.  Past Medical History  Diagnosis Date  . HYPERLIPIDEMIA   . HEMORRHOIDS-INTERNAL   . URI   . OBSTRUCTIVE SLEEP APNEA   . DISORDER, BIPOLAR NEC   . DEPRESSION   . ALOPECIA   . ALLERGIC RHINITIS   . ACNE NEC   . Anal fissure   . Anxiety   . Arthritis   . Chronic headaches   . Depression   . Bowel obstruction   . GERD (gastroesophageal reflux disease)     Family History  Problem Relation Age of Onset  . Breast cancer    . Lung cancer    . Colon cancer Maternal Uncle   . Colon polyps Maternal Uncle   . Other Paternal Aunt     x 2, blood clotting disorder  . Heart disease Sister   . Irritable bowel syndrome Cousin   . Kidney disease Maternal Uncle   . Esophageal cancer Paternal Uncle   . Rectal cancer Neg Hx   . Stomach cancer Neg Hx     History   Social History  . Marital Status: Married    Spouse Name: N/A    Number of Children: 4  . Years of Education: N/A   Occupational History  . disabled    Social History Main Topics  . Smoking status: Former Smoker    Types: Cigarettes  . Smokeless tobacco: Never Used  . Alcohol Use: No  . Drug Use: No  . Sexually Active: Not on file   Other Topics Concern  . Not on file   Social History Narrative  . No narrative on file    Current outpatient prescriptions:aspirin 325 MG EC tablet, Take 325 mg by mouth 2 (two) times daily.  , Disp: , Rfl: ;  cetirizine (ZYRTEC) 10 MG tablet, Take 10 mg by mouth daily., Disp: , Rfl: ;  Glucosamine-Chondroitin  (GLUCOSAMINE CHONDR COMPLEX PO), Take 1 capsule by mouth daily., Disp: , Rfl: ;  ibuprofen (ADVIL,MOTRIN) 200 MG tablet, Take 400 mg by mouth every 8 (eight) hours as needed. For pain., Disp: , Rfl:  lamoTRIgine (LAMICTAL) 150 MG tablet, Take 150 mg by mouth daily.  , Disp: , Rfl: ;  lithium carbonate (ESKALITH) 450 MG CR tablet, Take 450 mg by mouth 2 (two) times daily.  , Disp: , Rfl: ;  Multiple Vitamin (MULTIVITAMIN WITH MINERALS) TABS, Take 1 tablet by mouth daily., Disp: , Rfl: ;  sertraline (ZOLOFT) 50 MG tablet, Take 50 mg by mouth daily., Disp: , Rfl:   EXAM:  There were no vitals filed for this visit.  There is no height or weight on file to calculate BMI.  GENERAL: vitals reviewed and listed above, alert, oriented, appears well hydrated and in no acute distress  HEENT: atraumatic, conjunttiva clear, no obvious abnormalities on inspection of external nose and ears  NECK: no obvious masses on inspection  MS: moves all extremities without noticeable abnormality  PSYCH: pleasant and cooperative, no obvious depression or anxiety  ASSESSMENT AND PLAN:  Discussed the following assessment and plan:  No diagnosis found. -acute tx with ketorolacand depo-medrol  to reduce chance of recurrence - risks discussed -headache and diet /trigger journal -advise will not be providing chronic pain medications she asked for, any chronic medications should be addressed to PCP -follow up with PCP and headache clinic -Patient advised to return or notify a doctor immediately if symptoms worsen or persist or new concerns arise.  There are no Patient Instructions on file for this visit.   Kriste Basque R.

## 2012-04-15 NOTE — Addendum Note (Signed)
Addended by: Azucena Freed on: 04/15/2012 03:13 PM   Modules accepted: Orders

## 2012-05-07 DIAGNOSIS — L259 Unspecified contact dermatitis, unspecified cause: Secondary | ICD-10-CM | POA: Diagnosis not present

## 2012-06-19 HISTORY — PX: OTHER SURGICAL HISTORY: SHX169

## 2012-06-27 ENCOUNTER — Encounter: Payer: Medicare Other | Admitting: Genetic Counselor

## 2012-06-27 ENCOUNTER — Other Ambulatory Visit: Payer: Medicare Other | Admitting: Lab

## 2012-06-27 DIAGNOSIS — F3173 Bipolar disorder, in partial remission, most recent episode manic: Secondary | ICD-10-CM | POA: Diagnosis not present

## 2012-06-27 DIAGNOSIS — Z79899 Other long term (current) drug therapy: Secondary | ICD-10-CM | POA: Diagnosis not present

## 2012-06-27 DIAGNOSIS — F319 Bipolar disorder, unspecified: Secondary | ICD-10-CM | POA: Diagnosis not present

## 2012-08-13 DIAGNOSIS — M6740 Ganglion, unspecified site: Secondary | ICD-10-CM | POA: Diagnosis not present

## 2012-08-13 DIAGNOSIS — D492 Neoplasm of unspecified behavior of bone, soft tissue, and skin: Secondary | ICD-10-CM | POA: Diagnosis not present

## 2012-08-19 ENCOUNTER — Ambulatory Visit (INDEPENDENT_AMBULATORY_CARE_PROVIDER_SITE_OTHER): Payer: Medicare Other | Admitting: Internal Medicine

## 2012-08-19 ENCOUNTER — Encounter: Payer: Medicare Other | Admitting: Pulmonary Disease

## 2012-08-19 ENCOUNTER — Encounter: Payer: Self-pay | Admitting: Internal Medicine

## 2012-08-19 ENCOUNTER — Other Ambulatory Visit: Payer: Medicare Other

## 2012-08-19 VITALS — BP 122/82 | HR 93 | Ht 62.0 in | Wt 189.0 lb

## 2012-08-19 DIAGNOSIS — F3189 Other bipolar disorder: Secondary | ICD-10-CM | POA: Diagnosis not present

## 2012-08-19 DIAGNOSIS — G4733 Obstructive sleep apnea (adult) (pediatric): Secondary | ICD-10-CM

## 2012-08-19 DIAGNOSIS — J3089 Other allergic rhinitis: Secondary | ICD-10-CM

## 2012-08-19 DIAGNOSIS — J309 Allergic rhinitis, unspecified: Secondary | ICD-10-CM

## 2012-08-19 DIAGNOSIS — J302 Other seasonal allergic rhinitis: Secondary | ICD-10-CM

## 2012-08-19 NOTE — Assessment & Plan Note (Signed)
Daytime sleepiness and snoring indicate recurrence of OSA. Nasal congestion aggravates. Plan- update NPSG with split protocol. Discussed sleep hygiene.

## 2012-08-19 NOTE — Assessment & Plan Note (Signed)
This, and the associated meds, are likely to interfere with sleep.

## 2012-08-19 NOTE — Patient Instructions (Addendum)
Order- Schedule NPSG split protocol    Dx OSA              Lab- Allergy Profile   Dx Allergic rhinitis  Sample Dymista nasal spray    1-2 puffs each nostril once daily at bedtime

## 2012-08-19 NOTE — Progress Notes (Signed)
08/19/12- 34 yoF former smoker seeking to re-establish for hx OSA complicated by BiPolar/depression. Former patient-last seen 2010. Was using CAP 8/ SMS with good compliance and control. She lost weight, stopped snoring and dropped off CPAP then. Says she couldn't deal with water gurgling in the hose. Since then has regained weight, snoring loudly so husband leaves bedroom. Daytime sleepiness if sitting quietly. New problem- perennial rhinitis, intense around newly mowed grass and house dust. Never tested. Notes wheezing when she lies down. No hx ENT surgery or asthma. Smoked 1 ppw x 7 years. Family hx of COPD, food allergies. Bedtime 12MN-3AM, latency 2-3 hours, waking 8-10 x/ night before up 7-9 AM.  NPSG 08/25/08- [facility] 10.8/ hr, weighed 190 lbs.  Prior to Admission medications   Medication Sig Start Date End Date Taking? Authorizing Fielding Mault  aspirin 81 MG tablet Take 81 mg by mouth daily.   Yes Historical Waverly Tarquinio, MD  cyclobenzaprine (FLEXERIL) 10 MG tablet Take 10 mg by mouth 3 (three) times daily as needed for muscle spasms.   Yes Historical Kayelyn Lemon, MD  lamoTRIgine (LAMICTAL) 150 MG tablet Take 150 mg by mouth daily.     Yes Historical Faraaz Wolin, MD  lithium carbonate (ESKALITH) 450 MG CR tablet Take 450 mg by mouth 2 (two) times daily.     Yes Historical Cabrini Ruggieri, MD  Multiple Vitamin (MULTIVITAMIN WITH MINERALS) TABS Take 1 tablet by mouth daily.   Yes Historical Conna Terada, MD  sertraline (ZOLOFT) 50 MG tablet Take 50 mg by mouth daily.   Yes Historical Zakry Caso, MD   Past Medical History  Diagnosis Date  . HYPERLIPIDEMIA   . HEMORRHOIDS-INTERNAL   . URI   . OBSTRUCTIVE SLEEP APNEA   . DISORDER, BIPOLAR NEC   . DEPRESSION   . ALOPECIA   . ALLERGIC RHINITIS   . ACNE NEC   . Anal fissure   . Anxiety   . Arthritis   . Chronic headaches   . Depression   . Bowel obstruction   . GERD (gastroesophageal reflux disease)    Past Surgical History  Procedure Laterality Date  . Knee  surgery      bilateral  . Breast lumpectomy    . Radical hysterectomy    . Tonsillectomy     Family History  Problem Relation Age of Onset  . Breast cancer    . Lung cancer    . Colon cancer Maternal Uncle   . Colon polyps Maternal Uncle   . Other Paternal Aunt     x 2, blood clotting disorder  . Heart disease Sister   . Irritable bowel syndrome Cousin   . Kidney disease Maternal Uncle   . Esophageal cancer Paternal Uncle   . Rectal cancer Neg Hx   . Stomach cancer Neg Hx    History   Social History  . Marital Status: Married    Spouse Name: N/A    Number of Children: 4  . Years of Education: N/A   Occupational History  . disabled    Social History Main Topics  . Smoking status: Former Smoker -- 0.20 packs/day for 7 years    Types: Cigarettes    Quit date: 06/20/1987  . Smokeless tobacco: Never Used  . Alcohol Use: No  . Drug Use: No  . Sexually Active: Not on file   Other Topics Concern  . Not on file   Social History Narrative  . No narrative on file   ROS-see HPI Constitutional:   No-   weight  loss, night sweats, fevers, chills, +fatigue, lassitude. HEENT:   No-  headaches, difficulty swallowing, tooth/dental problems, sore throat,       + sneezing, itching, ear ache, +nasal congestion, +post nasal drip,  CV:  No-   chest pain, orthopnea, PND, swelling in lower extremities, anasarca,                                  dizziness, palpitations Resp: No-   shortness of breath with exertion or at rest.              No-   productive cough,  No non-productive cough,  No- coughing up of blood.              No-   change in color of mucus.  + wheezing.   Skin: No-   rash or lesions. GI:  No-   heartburn, indigestion, abdominal pain, nausea, vomiting, diarrhea,                 change in bowel habits, loss of appetite GU: No-   dysuria, change in color of urine, no urgency or frequency.  No- flank pain. MS:  + joint pain or swelling.  No- decreased range of motion.   No- back pain. Neuro-     nothing unusual Psych:  No- change in mood or affect. + depression or anxiety.  No memory loss.  OBJ- Physical Exam General- Alert, Oriented, Affect-appropriate, Distress- none acute Skin- rash-none, lesions- none, excoriation- none Lymphadenopathy- none Head- atraumatic            Eyes- Gross vision intact, PERRLA, conjunctivae and secretions clear            Ears- Hearing, canals-normal            Nose- C+ sniffing, +pale turbinate ededma, no-Septal dev, mucus, polyps, erosion, perforation             Throat- Mallampati III , mucosa clear , drainage- none, tonsils- atrophic Neck- flexible , trachea midline, no stridor , thyroid nl, carotid no bruit Chest - symmetrical excursion , unlabored           Heart/CV- RRR , no murmur , no gallop  , no rub, nl s1 s2                           - JVD- none , edema- none, stasis changes- none, varices- none           Lung- clear to P&A, wheeze- none, cough- none , dullness-none, rub- none           Chest wall-  Abd- tender-no, distended-no, bowel sounds-present, HSM- no Br/ Gen/ Rectal- Not done, not indicated Extrem- cyanosis- none, clubbing, none, atrophy- none, strength- nl. Cane Neuro- grossly intact to observation

## 2012-08-19 NOTE — Assessment & Plan Note (Signed)
Probably allergic rhinitis.  Plan- Allergy profile, sample Dymista nasal spray.

## 2012-08-20 LAB — ALLERGY FULL PROFILE
Allergen, D pternoyssinus,d7: <0.1 kU/L
Allergen,Goose feathers, e70: <0.1 kU/L
Aspergillus fumigatus, m3: <0.1 kU/L
Bermuda Grass: <0.1 kU/L
Common Ragweed: <0.1 kU/L
D. farinae: <0.1 kU/L
Dog Dander: <0.1 kU/L
Goldenrod: <0.1 kU/L
Helminthosporium halodes: <0.1 kU/L
House Dust Hollister: <0.1 kU/L
IgE (Immunoglobulin E), Serum: 3.7 IU/mL (ref 0.0–180.0)
Lamb's Quarters: <0.1 kU/L
Plantain: <0.1 kU/L
Stemphylium Botryosum: <0.1 kU/L

## 2012-08-21 DIAGNOSIS — H2510 Age-related nuclear cataract, unspecified eye: Secondary | ICD-10-CM | POA: Diagnosis not present

## 2012-08-26 ENCOUNTER — Encounter: Payer: Medicare Other | Admitting: Internal Medicine

## 2012-08-28 DIAGNOSIS — M19049 Primary osteoarthritis, unspecified hand: Secondary | ICD-10-CM | POA: Diagnosis not present

## 2012-08-28 DIAGNOSIS — M654 Radial styloid tenosynovitis [de Quervain]: Secondary | ICD-10-CM | POA: Diagnosis not present

## 2012-09-04 ENCOUNTER — Telehealth: Payer: Self-pay | Admitting: Internal Medicine

## 2012-09-04 ENCOUNTER — Ambulatory Visit (HOSPITAL_BASED_OUTPATIENT_CLINIC_OR_DEPARTMENT_OTHER): Payer: Medicare Other | Attending: Internal Medicine

## 2012-09-04 VITALS — Ht 62.0 in | Wt 190.0 lb

## 2012-09-04 DIAGNOSIS — G4733 Obstructive sleep apnea (adult) (pediatric): Secondary | ICD-10-CM | POA: Diagnosis not present

## 2012-09-04 MED ORDER — AZELASTINE-FLUTICASONE 137-50 MCG/ACT NA SUSP: 1.0000 | Freq: Once | NASAL | Status: DC

## 2012-09-04 NOTE — Telephone Encounter (Signed)
.   Spoke with pt was gave a sample of the dymista on last ov pt states seems to be helping. Would like rx .  rx sent. Nothing further needed.

## 2012-09-05 ENCOUNTER — Encounter (HOSPITAL_BASED_OUTPATIENT_CLINIC_OR_DEPARTMENT_OTHER): Payer: Medicare Other

## 2012-09-05 DIAGNOSIS — M19049 Primary osteoarthritis, unspecified hand: Secondary | ICD-10-CM | POA: Diagnosis not present

## 2012-09-05 DIAGNOSIS — M654 Radial styloid tenosynovitis [de Quervain]: Secondary | ICD-10-CM | POA: Diagnosis not present

## 2012-09-08 DIAGNOSIS — R0989 Other specified symptoms and signs involving the circulatory and respiratory systems: Secondary | ICD-10-CM | POA: Diagnosis not present

## 2012-09-08 DIAGNOSIS — G4733 Obstructive sleep apnea (adult) (pediatric): Secondary | ICD-10-CM | POA: Diagnosis not present

## 2012-09-08 DIAGNOSIS — R0609 Other forms of dyspnea: Secondary | ICD-10-CM | POA: Diagnosis not present

## 2012-09-08 NOTE — Procedures (Signed)
Brittany [name], Brittany [name]                ACCOUNT NO.:  [id number]  MEDICAL RECORD NO.:  [id number]          PATIENT TYPE:  OUT  LOCATION:  SLEEP CENTER                 FACILITY:  [facility]  PHYSICIAN:  Demorio Seeley D. Maple Hudson, MD, FCCP, FACPDATE OF BIRTH:  07-28-1956  DATE OF STUDY:  09/04/2012                           NOCTURNAL POLYSOMNOGRAM  REFERRING PHYSICIAN:  Colleen Kotlarz D. Kattie Santoyo, MD, FCCP, FACP  INDICATION FOR STUDY:  Hypersomnia with sleep apnea.  EPWORTH SLEEPINESS SCORE:  11/24.  BMI 34.8, weight 190 pounds.  Height 62 inches, neck 14.5 inches.  MEDICATIONS:  Home medications are charted and reviewed.  SLEEP ARCHITECTURE:  Total sleep time 286 minutes with sleep efficiency 73.5%.  Stage I was 15%, stage II, 72.6%; stage III, 1%, REM 11.4% of total sleep time.  Sleep latency 45 minutes, REM latency 244.5 minutes, awake after sleep onset, 63 minutes.  Arousal index 9.  BEDTIME MEDICATION:  Lamotrigine, Zoloft, Dymista, aspirin, Flexeril, Eskalith.  RESPIRATORY DATA:  Apnea-hypopnea index (AHI) 10.1 per hour.  A total of 48 events was scored including 9 obstructive apneas and 39 hypopneas. Events were seen in all sleep positions, but especially while supine and in REM.  REM/AHI 31.4 per hour.  There were insufficient sleep and sustained sleep time, to permit split CPAP titration by protocol.  OXYGEN DATA:  Moderately loud snoring with oxygen desaturation to a nadir of 84% and mean oxygen saturation through the study of 92.3% on room air.  CARDIAC DATA:  Sinus rhythm.  MOVEMENT-PARASOMNIA:  No significant movement disturbance. Bathroom x1.  IMPRESSIONS-RECOMMENDATIONS: 1. Sleep pattern was marked by difficulty initiating and maintaining     sleep, especially before 1 a.m.  She was then again awake for at     least 45 minutes between 1 and 2 a.m. 2. Mild obstructive sleep apnea/hypopnea syndrome, apnea-hypopnea     index 10.1 per hour with moderately loud snoring and oxygen  desaturation to a nadir of 84% with mean saturation of 92.3% on     room air. 3. She did not qualify by protocol for continuous positive airway     pressure on this study.  Consider return for     dedicated continuous positive airway pressure titration or evaluate     for alternative management as clinically appropriate.     Davinder Haff D. Maple Hudson, MD, [facility], FACP Diplomate, American Board of Sleep Medicine    CDY/MEDQ  D:  09/08/2012 12:20:27  T:  09/08/2012 16:31:29  Job:  [id number]

## 2012-09-19 ENCOUNTER — Ambulatory Visit (INDEPENDENT_AMBULATORY_CARE_PROVIDER_SITE_OTHER): Payer: Medicare Other | Admitting: Internal Medicine

## 2012-09-19 ENCOUNTER — Encounter: Payer: Self-pay | Admitting: Internal Medicine

## 2012-09-19 VITALS — BP 110/78 | HR 85 | Ht 62.0 in | Wt 193.4 lb

## 2012-09-19 DIAGNOSIS — G4733 Obstructive sleep apnea (adult) (pediatric): Secondary | ICD-10-CM | POA: Diagnosis not present

## 2012-09-19 NOTE — Patient Instructions (Addendum)
Order- new CPAP, mask of choice, supplies     autotitrate x 7 days 5-15 cwp for pressure recommendation   Dx OSA

## 2012-09-19 NOTE — Progress Notes (Signed)
08/19/12- 58 yoF former smoker seeking to re-establish for hx OSA complicated by BiPolar/depression. Former patient-last seen 2010. Was using CAP 8/ SMS with good compliance and control. She lost weight, stopped snoring and dropped off CPAP then. Says she couldn't deal with water gurgling in the hose. Since then has regained weight, snoring loudly so husband leaves bedroom. Daytime sleepiness if sitting quietly. New problem- perennial rhinitis, intense around newly mowed grass and house dust. Never tested. Notes wheezing when she lies down. No hx ENT surgery or asthma. Smoked 1 ppw x 7 years. Family hx of COPD, food allergies. Bedtime 12MN-3AM, latency 2-3 hours, waking 8-10 x/ night before up 7-9 AM.  NPSG 08/25/08- AHI 10.8/ hr, weighed 190 lbs.  09/19/12- 34 yoF former smoker seeking to re-establish for hx OSA complicated by BiPolar/depression FOLLOWS FOR: review sleep study with patient. Has difficulty sleeping away from her familiar bedroom .NPSG 09/04/12- AHI 10.1 per hour, loud snoring, weight 190 pounds. Mild obstructive sleep apnea.  ROS-see HPI Constitutional:   No-   weight loss, night sweats, fevers, chills, +fatigue, lassitude. HEENT:   No-  headaches, difficulty swallowing, tooth/dental problems, sore throat,       No- sneezing, itching, ear ache, +nasal congestion, +post nasal drip,  CV:  No-   chest pain, orthopnea, PND, swelling in lower extremities, anasarca,                                  dizziness, palpitations Resp: No-   shortness of breath with exertion or at rest.              No-   productive cough,  No non-productive cough,  No- coughing up of blood.              No-   change in color of mucus.  + wheezing.   Skin: No-   rash or lesions. GI:  No-   heartburn, indigestion, abdominal pain, nausea, vomiting, GU: . MS:  + joint pain or swelling.  Neuro-     nothing unusual Psych:  No- change in mood or affect. + depression or anxiety.  No memory loss.  OBJ- Physical  Exam General- Alert, Oriented, Affect-appropriate, Distress- none acute Skin- rash-none, lesions- none, excoriation- none Lymphadenopathy- none Head- atraumatic            Eyes- Gross vision intact, PERRLA, conjunctivae and secretions clear            Ears- Hearing, canals-normal            Nose- + sniffing, +pale turbinate ededma, no-Septal dev, mucus, polyps, erosion, perforation             Throat- Mallampati III , mucosa clear , drainage- none, tonsils- atrophic Neck- flexible , trachea midline, no stridor , thyroid nl, carotid no bruit Chest - symmetrical excursion , unlabored           Heart/CV- RRR , no murmur , no gallop  , no rub, nl s1 s2                           - JVD- none , edema- none, stasis changes- none, varices- none           Lung- clear to P&A, wheeze- none, cough- none , dullness-none, rub- none           Chest wall-  Abd-  Br/ Gen/ Rectal- Not done, not indicated Extrem- cyanosis- none, clubbing, none, atrophy- none, strength- nl. Cane Neuro- grossly intact to observation

## 2012-09-28 NOTE — Assessment & Plan Note (Signed)
Impression mild obstructive sleep apnea. We reviewed the physiology, medical concerns, basic issues of safe driving responsibility, sleep hygiene and weight. Plan-reestablish CPAP with autotitration to assess for pressure. Discussed oral appliances as an alternative

## 2012-10-07 ENCOUNTER — Telehealth: Payer: Self-pay | Admitting: Internal Medicine

## 2012-10-07 NOTE — Telephone Encounter (Signed)
On 09/19/12 order was sent for: Note  Referral order ,copy of sleep study, ins card faxed to [facility] .Brittany [name], CMA ---  Called, spoke with pt.  Pt states she still doesn't have a cpap machine and has only heard from [facility] once.  She doesn't want to use this DME.  She would like order sent to a new DME and would like this DME to come to her house to actually set up the machine.  She used [facility] in the past and would like order sent to them if they are still in business.  If not, she would like it sent to another DME.  PCCs, pls advise. Thank you.

## 2012-10-08 NOTE — Telephone Encounter (Signed)
Called and spoke to [email or web address]  she will call pt and get this straighten out Tobe Sos

## 2012-10-10 NOTE — Telephone Encounter (Signed)
Called and spoke with Selena Batten @[facility]  to follow up. Per Selena Batten the therapist called patient to try and get this straightened out, however patient didn't answer and they lmomtcb Selena Batten states patient had appt 09/26/12 but cancelled. Selena Batten will call our office and keep [place] update once they have spoken with patient.

## 2012-10-11 NOTE — Telephone Encounter (Signed)
Spoke with Shanda Bumps @ [facility] States they tried to contact patient yesterday (4/24) lmomtcb Then spoke with patient to attempt to set an appt and patient stated she was going out of town and would call [facility] back Will keep msg for further follow up

## 2012-10-14 NOTE — Telephone Encounter (Signed)
Spoke with Larita Fife @ [facility]-- Inquiring about status of patient and this matter Per Larita Fife patient still has not returned call (out of town) Will keep msg for follow up

## 2012-10-21 ENCOUNTER — Ambulatory Visit (INDEPENDENT_AMBULATORY_CARE_PROVIDER_SITE_OTHER): Payer: Medicare Other | Admitting: Internal Medicine

## 2012-10-21 ENCOUNTER — Encounter: Payer: Medicare Other | Admitting: Internal Medicine

## 2012-10-21 ENCOUNTER — Encounter: Payer: Self-pay | Admitting: Internal Medicine

## 2012-10-21 VITALS — BP 112/80 | HR 68 | Temp 98.1°F | Ht 62.0 in | Wt 189.0 lb

## 2012-10-21 DIAGNOSIS — Z0000 Encounter for general adult medical examination without abnormal findings: Secondary | ICD-10-CM

## 2012-10-21 DIAGNOSIS — E785 Hyperlipidemia, unspecified: Secondary | ICD-10-CM

## 2012-10-21 DIAGNOSIS — M549 Dorsalgia, unspecified: Secondary | ICD-10-CM | POA: Diagnosis not present

## 2012-10-21 DIAGNOSIS — Z86010 Personal history of colonic polyps: Secondary | ICD-10-CM

## 2012-10-21 DIAGNOSIS — G8929 Other chronic pain: Secondary | ICD-10-CM | POA: Insufficient documentation

## 2012-10-21 DIAGNOSIS — R7309 Other abnormal glucose: Secondary | ICD-10-CM

## 2012-10-21 DIAGNOSIS — R739 Hyperglycemia, unspecified: Secondary | ICD-10-CM

## 2012-10-21 DIAGNOSIS — L659 Nonscarring hair loss, unspecified: Secondary | ICD-10-CM

## 2012-10-21 LAB — BASIC METABOLIC PANEL
BUN: 5 mg/dL — ABNORMAL LOW (ref 6–23)
Calcium: 10.9 mg/dL — ABNORMAL HIGH (ref 8.4–10.5)
Creatinine, Ser: 0.8 mg/dL (ref 0.4–1.2)
GFR: 101.44 mL/min (ref >60.00)
Glucose, Bld: 95 mg/dL (ref 70–99)

## 2012-10-21 LAB — LIPID PANEL
HDL: 58.2 mg/dL (ref >39.00)
VLDL: 23.8 mg/dL (ref 0.0–40.0)

## 2012-10-21 LAB — CBC WITH DIFFERENTIAL/PLATELET
Basophils Absolute: 0.1 10*3/uL (ref 0.0–0.1)
Hemoglobin: 14.3 g/dL (ref 12.0–15.0)
Lymphocytes Relative: 35.1 % (ref 12.0–46.0)
Monocytes Relative: 7.2 % (ref 3.0–12.0)
Neutro Abs: 5.4 10*3/uL (ref 1.4–7.7)
RBC: 4.65 Mil/uL (ref 3.87–5.11)
RDW: 15.1 % — ABNORMAL HIGH (ref 11.5–14.6)

## 2012-10-21 LAB — POCT URINALYSIS DIPSTICK
Blood, UA: NEGATIVE
Protein, UA: NEGATIVE
Spec Grav, UA: 1.02
Urobilinogen, UA: 0.2
pH, UA: 6

## 2012-10-21 LAB — TSH: TSH: 1.77 u[IU]/mL (ref 0.35–5.50)

## 2012-10-21 LAB — HEPATIC FUNCTION PANEL
AST: 19 U/L (ref 0–37)
Albumin: 4.3 g/dL (ref 3.5–5.2)

## 2012-10-21 NOTE — Progress Notes (Signed)
Patient ID: Brittany [name], female   DOB: Aug 15, 1955, 57 y.o.   MRN: [id number]  CPX  Past Medical History  Diagnosis Date  . HYPERLIPIDEMIA   . HEMORRHOIDS-INTERNAL   . URI   . OBSTRUCTIVE SLEEP APNEA   . DISORDER, BIPOLAR NEC   . DEPRESSION   . ALOPECIA   . ALLERGIC RHINITIS   . ACNE NEC   . Anal fissure   . Anxiety   . Arthritis   . Chronic headaches   . Depression   . Bowel obstruction   . GERD (gastroesophageal reflux disease)     History   Social History  . Marital Status: Married    Spouse Name: N/A    Number of Children: 4  . Years of Education: N/A   Occupational History  . disabled    Social History Main Topics  . Smoking status: Former Smoker -- 0.20 packs/day for 7 years    Types: Cigarettes    Quit date: 06/20/1987  . Smokeless tobacco: Never Used  . Alcohol Use: No  . Drug Use: No  . Sexually Active: Not on file   Other Topics Concern  . Not on file   Social History Narrative  . No narrative on file    Past Surgical History  Procedure Laterality Date  . Knee surgery      bilateral  . Breast lumpectomy    . Radical hysterectomy    . Tonsillectomy      Family History  Problem Relation Age of Onset  . Breast cancer    . Lung cancer    . Colon cancer Maternal Uncle   . Colon polyps Maternal Uncle   . Other Paternal Aunt     x 2, blood clotting disorder  . Heart disease Sister   . Irritable bowel syndrome Cousin   . Kidney disease Maternal Uncle   . Esophageal cancer Paternal Uncle   . Rectal cancer Neg Hx   . Stomach cancer Neg Hx     Allergies  Allergen Reactions  . Flexeril (Cyclobenzaprine)     Unable to leave home when taking as she is incoherent/unsure where she is at times  . Hydrocodone     REACTION: GI upset    Current Outpatient Prescriptions on File Prior to Visit  Medication Sig Dispense Refill  . aspirin 325 MG tablet Take 325 mg by mouth daily.      . Azelastine-Fluticasone (DYMISTA) 137-50 MCG/ACT SUSP Place  1 spray into the nose once. 1-2 sprays in each nostril at bed time .  1 Bottle  6  . cyclobenzaprine (FLEXERIL) 10 MG tablet Take 10 mg by mouth 3 (three) times daily as needed for muscle spasms.      Marland Kitchen lamoTRIgine (LAMICTAL) 150 MG tablet Take 150 mg by mouth daily.        Marland Kitchen lithium carbonate (ESKALITH) 450 MG CR tablet Take 450 mg by mouth 2 (two) times daily.        . Multiple Vitamin (MULTIVITAMIN WITH MINERALS) TABS Take 1 tablet by mouth daily.      . sertraline (ZOLOFT) 50 MG tablet Take 50 mg by mouth daily.       No current facility-administered medications on file prior to visit.     patient denies chest pain, shortness of breath, orthopnea. Denies lower extremity edema, abdominal pain, change in appetite, change in bowel movements. Patient denies rashes, musculoskeletal complaints. No other specific complaints in a complete review of systems.  There were no vitals taken for this visit.  Well-developed well-nourished female in no acute distress. HEENT exam atraumatic, normocephalic, extraocular muscles are intact. Neck is supple. No jugular venous distention no thyromegaly. Chest clear to auscultation without increased work of breathing. Cardiac exam S1 and S2 are regular. Abdominal exam active bowel sounds, soft, nontender. Extremities no edema. Neurologic exam she is alert without any motor sensory deficits. Gait is normal.  Well Visit- Health Maint- UTD

## 2012-10-23 DIAGNOSIS — F319 Bipolar disorder, unspecified: Secondary | ICD-10-CM | POA: Diagnosis not present

## 2012-10-29 ENCOUNTER — Encounter: Payer: Self-pay | Admitting: Physical Medicine & Rehabilitation

## 2012-11-04 ENCOUNTER — Other Ambulatory Visit: Payer: Medicare Other

## 2012-11-04 DIAGNOSIS — Z1231 Encounter for screening mammogram for malignant neoplasm of breast: Secondary | ICD-10-CM | POA: Diagnosis not present

## 2012-11-05 ENCOUNTER — Emergency Department (HOSPITAL_COMMUNITY): Payer: Medicare Other

## 2012-11-05 ENCOUNTER — Encounter (HOSPITAL_COMMUNITY): Payer: Self-pay | Admitting: Emergency Medicine

## 2012-11-05 ENCOUNTER — Emergency Department (HOSPITAL_COMMUNITY)
Admission: EM | Admit: 2012-11-05 | Discharge: 2012-11-05 | Disposition: A | Discharge status: Home / Self Care | Payer: Medicare Other | Attending: Emergency Medicine | Admitting: Emergency Medicine

## 2012-11-05 DIAGNOSIS — R0602 Shortness of breath: Secondary | ICD-10-CM | POA: Diagnosis not present

## 2012-11-05 DIAGNOSIS — G4733 Obstructive sleep apnea (adult) (pediatric): Secondary | ICD-10-CM | POA: Insufficient documentation

## 2012-11-05 DIAGNOSIS — F3289 Other specified depressive episodes: Secondary | ICD-10-CM | POA: Insufficient documentation

## 2012-11-05 DIAGNOSIS — F329 Major depressive disorder, single episode, unspecified: Secondary | ICD-10-CM | POA: Diagnosis not present

## 2012-11-05 DIAGNOSIS — K219 Gastro-esophageal reflux disease without esophagitis: Secondary | ICD-10-CM | POA: Insufficient documentation

## 2012-11-05 DIAGNOSIS — Z79899 Other long term (current) drug therapy: Secondary | ICD-10-CM | POA: Insufficient documentation

## 2012-11-05 DIAGNOSIS — R079 Chest pain, unspecified: Secondary | ICD-10-CM | POA: Diagnosis not present

## 2012-11-05 DIAGNOSIS — L659 Nonscarring hair loss, unspecified: Secondary | ICD-10-CM | POA: Insufficient documentation

## 2012-11-05 DIAGNOSIS — M129 Arthropathy, unspecified: Secondary | ICD-10-CM | POA: Diagnosis not present

## 2012-11-05 DIAGNOSIS — E785 Hyperlipidemia, unspecified: Secondary | ICD-10-CM | POA: Diagnosis not present

## 2012-11-05 DIAGNOSIS — R071 Chest pain on breathing: Secondary | ICD-10-CM | POA: Diagnosis not present

## 2012-11-05 DIAGNOSIS — F3189 Other bipolar disorder: Secondary | ICD-10-CM | POA: Insufficient documentation

## 2012-11-05 DIAGNOSIS — Z7982 Long term (current) use of aspirin: Secondary | ICD-10-CM | POA: Diagnosis not present

## 2012-11-05 DIAGNOSIS — Z8719 Personal history of other diseases of the digestive system: Secondary | ICD-10-CM | POA: Diagnosis not present

## 2012-11-05 DIAGNOSIS — Z8679 Personal history of other diseases of the circulatory system: Secondary | ICD-10-CM | POA: Insufficient documentation

## 2012-11-05 DIAGNOSIS — Z87891 Personal history of nicotine dependence: Secondary | ICD-10-CM | POA: Diagnosis not present

## 2012-11-05 DIAGNOSIS — M25519 Pain in unspecified shoulder: Secondary | ICD-10-CM | POA: Insufficient documentation

## 2012-11-05 DIAGNOSIS — F411 Generalized anxiety disorder: Secondary | ICD-10-CM | POA: Insufficient documentation

## 2012-11-05 DIAGNOSIS — R0789 Other chest pain: Secondary | ICD-10-CM

## 2012-11-05 LAB — BASIC METABOLIC PANEL
BUN: 9 mg/dL (ref 6–23)
CO2: 19 mEq/L (ref 19–32)
Calcium: 10.6 mg/dL — ABNORMAL HIGH (ref 8.4–10.5)
GFR calc non Af Amer: >90 mL/min (ref >90)
Glucose, Bld: 112 mg/dL — ABNORMAL HIGH (ref 70–99)

## 2012-11-05 LAB — CBC WITH DIFFERENTIAL/PLATELET
Eosinophils Absolute: 0.3 10*3/uL (ref 0.0–0.7)
Eosinophils Relative: 3 % (ref 0–5)
HCT: 37.3 % (ref 36.0–46.0)
Hemoglobin: 13.3 g/dL (ref 12.0–15.0)
Lymphs Abs: 3.8 10*3/uL (ref 0.7–4.0)
MCH: 31.4 pg (ref 26.0–34.0)
MCV: 88.2 fL (ref 78.0–100.0)
Monocytes Relative: 6 % (ref 3–12)
RBC: 4.23 MIL/uL (ref 3.87–5.11)

## 2012-11-05 LAB — POCT I-STAT TROPONIN I: Troponin i, poc: 0.01 ng/mL (ref 0.00–0.08)

## 2012-11-05 MED ORDER — IBUPROFEN 600 MG PO TABS: 600.0000 mg | ORAL_TABLET | Freq: Four times a day (QID) | ORAL | Status: DC | PRN

## 2012-11-05 MED ORDER — ASPIRIN 81 MG PO CHEW
162.0000 mg | CHEWABLE_TABLET | Freq: Once | ORAL | Status: DC
  Filled 2012-11-05: qty 2

## 2012-11-05 MED ORDER — KETOROLAC TROMETHAMINE 30 MG/ML IJ SOLN
30.0000 mg | Freq: Once | INTRAMUSCULAR | Status: AC
  Administered 2012-11-05: 30 mg via INTRAVENOUS
  Filled 2012-11-05: qty 1

## 2012-11-05 MED ORDER — OXYCODONE-ACETAMINOPHEN 5-325 MG PO TABS: 1.0000 | ORAL_TABLET | Freq: Once | ORAL | Status: DC

## 2012-11-05 MED ORDER — TRAMADOL HCL 50 MG PO TABS: 50.0000 mg | ORAL_TABLET | Freq: Four times a day (QID) | ORAL | Status: DC | PRN

## 2012-11-05 NOTE — ED Provider Notes (Signed)
History     CSN: [id number]  Arrival date & time 11/05/12  0709   First MD Initiated Contact with Patient 11/05/12 0715      No chief complaint on file.   (Consider location/radiation/quality/duration/timing/severity/associated sxs/prior treatment) HPI Brittany [name] is a 57 y.o. female who presents to ED with complaint of left sided chest pain. States chest pain started this morning after she woke up. Pain in in the left chest. Radiates into back and left arm. Worsened with movement, palpation,deep breaths. Pain is sharp, constant. States tried taking asprin with no relief. Denies SOB, stats just painful to take a deep breath. States had a mammogram done yesterday. Denies recent travel or surgeries. No hx of PE or DVT. No prior cardiac disease. States had a negative stress test 3 years ago. No other complaints.  Past Medical History  Diagnosis Date  . HYPERLIPIDEMIA   . HEMORRHOIDS-INTERNAL   . URI   . OBSTRUCTIVE SLEEP APNEA   . DISORDER, BIPOLAR NEC   . DEPRESSION   . ALOPECIA   . ALLERGIC RHINITIS   . ACNE NEC   . Anal fissure   . Anxiety   . Arthritis   . Chronic headaches   . Depression   . Bowel obstruction   . GERD (gastroesophageal reflux disease)     Past Surgical History  Procedure Laterality Date  . Knee surgery      bilateral  . Breast lumpectomy    . Radical hysterectomy    . Tonsillectomy      Family History  Problem Relation Age of Onset  . Breast cancer    . Lung cancer    . Colon cancer Maternal Uncle   . Colon polyps Maternal Uncle   . Other Paternal Aunt     x 2, blood clotting disorder  . Heart disease Sister   . Irritable bowel syndrome Cousin   . Kidney disease Maternal Uncle   . Esophageal cancer Paternal Uncle   . Rectal cancer Neg Hx   . Stomach cancer Neg Hx     History  Substance Use Topics  . Smoking status: Former Smoker -- 0.20 packs/day for 7 years    Types: Cigarettes    Quit date: 06/20/1987  . Smokeless tobacco: Never  Used  . Alcohol Use: No    OB History   Grav Para Term Preterm Abortions TAB SAB Ect Mult Living                  Review of Systems  Constitutional: Negative for fever and chills.  HENT: Negative for neck pain and neck stiffness.   Respiratory: Positive for chest tightness. Negative for cough and shortness of breath.   Cardiovascular: Positive for chest pain.  Gastrointestinal: Negative.   Genitourinary: Negative for dysuria.  Musculoskeletal: Negative.   Skin: Negative.   Neurological: Negative for dizziness, weakness, numbness and headaches.    Allergies  Flexeril and Hydrocodone  Home Medications   Current Outpatient Rx  Name  Route  Sig  Dispense  Refill  . aspirin 325 MG tablet   Oral   Take 325 mg by mouth daily.         . Azelastine-Fluticasone (DYMISTA) 137-50 MCG/ACT SUSP   Nasal   Place 1 spray into the nose once. 1-2 sprays in each nostril at bed time .   1 Bottle   6   . cyclobenzaprine (FLEXERIL) 10 MG tablet   Oral   Take 10 mg by mouth  3 (three) times daily as needed for muscle spasms.         Marland Kitchen lamoTRIgine (LAMICTAL) 150 MG tablet   Oral   Take 150 mg by mouth daily.           Marland Kitchen lithium carbonate (ESKALITH) 450 MG CR tablet   Oral   Take 450 mg by mouth 2 (two) times daily.           . Multiple Vitamin (MULTIVITAMIN WITH MINERALS) TABS   Oral   Take 1 tablet by mouth daily.         . sertraline (ZOLOFT) 50 MG tablet   Oral   Take 50 mg by mouth daily.           BP 118/81  Pulse 85  Temp(Src) 98 F (36.7 C) (Oral)  Resp 22  SpO2 100%  Physical Exam  Nursing note and vitals reviewed. Constitutional: She appears well-developed and well-nourished. No distress.  HENT:  Head: Normocephalic.  Eyes: Conjunctivae are normal.  Neck: Neck supple.  Cardiovascular: Normal rate, regular rhythm and normal heart sounds.   Pulmonary/Chest: Effort normal and breath sounds normal. No respiratory distress. She has no wheezes. She has  no rales.  Left anterior chest wall tenderness, over the breast  Abdominal: Soft. Bowel sounds are normal. She exhibits no distension. There is no tenderness. There is no rebound.  Musculoskeletal: She exhibits no edema.  Pain with left shoulder extension and abduction and forward flexion against resistance. Full rom.   Neurological: She is alert.  Skin: Skin is warm and dry.    ED Course  Procedures (including critical care time)   Date: 11/05/2012  Rate: 82  Rhythm: normal sinus rhythm  QRS Axis: normal  Intervals: normal  ST/T Wave abnormalities: normal  Conduction Disutrbances:none  Narrative Interpretation:   Old EKG Reviewed: none available   Results for orders placed during the hospital encounter of 11/05/12  CBC WITH DIFFERENTIAL      Result Value Range   WBC 9.2  4.0 - 10.5 K/uL   RBC 4.23  3.87 - 5.11 MIL/uL   Hemoglobin 13.3  12.0 - 15.0 g/dL   HCT 16.1  09.6 - 04.5 %   MCV 88.2  78.0 - 100.0 fL   MCH 31.4  26.0 - 34.0 pg   MCHC 35.7  30.0 - 36.0 g/dL   RDW 40.9  81.1 - 91.4 %   Platelets 390  150 - 400 K/uL   Neutrophils Relative % 50  43 - 77 %   Neutro Abs 4.6  1.7 - 7.7 K/uL   Lymphocytes Relative 42  12 - 46 %   Lymphs Abs 3.8  0.7 - 4.0 K/uL   Monocytes Relative 6  3 - 12 %   Monocytes Absolute 0.5  0.1 - 1.0 K/uL   Eosinophils Relative 3  0 - 5 %   Eosinophils Absolute 0.3  0.0 - 0.7 K/uL   Basophils Relative 0  0 - 1 %   Basophils Absolute 0.0  0.0 - 0.1 K/uL  BASIC METABOLIC PANEL      Result Value Range   Sodium 135  135 - 145 mEq/L   Potassium 3.8  3.5 - 5.1 mEq/L   Chloride 104  96 - 112 mEq/L   CO2 19  19 - 32 mEq/L   Glucose, Bld 112 (*) 70 - 99 mg/dL   BUN 9  6 - 23 mg/dL   Creatinine, Ser 7.82  0.50 -  1.10 mg/dL   Calcium 45.4 (*) 8.4 - 10.5 mg/dL   GFR calc non Af Amer >90  >90 mL/min   GFR calc Af Amer >90  >90 mL/min  LITHIUM LEVEL      Result Value Range   Lithium Lvl 0.55 (*) 0.80 - 1.40 mEq/L  POCT I-STAT TROPONIN I       Result Value Range   Troponin i, poc 0.01  0.00 - 0.08 ng/mL   Comment 3            Dg Chest 2 View  11/05/2012   *RADIOLOGY REPORT*  Clinical Data: Chest pain and left-sided arm numbness.  Shortness of breath.  CHEST - 2 VIEW  Comparison: No priors.  Findings: Lung volumes are slightly low.  No consolidative airspace disease.  No pleural effusions.  No pneumothorax.  No pulmonary nodule or mass noted.  Pulmonary vasculature and the cardiomediastinal silhouette are within normal limits.  IMPRESSION: 1. Low lung volumes without radiographic evidence of acute cardiopulmonary disease.   Original Report Authenticated By: Trudie Reed, M.D.     1. Chest wall pain       MDM  PT with left sided chest pain, that is atypical in nature. Reproducible on exam with palpation of the chest and left arm movement. She is low risk for PE, normal VS, not hypoxyc, not tachycardic or tachypnec. She has no cardiac hx, negative stress test several years ago. Given her symptoms and a mammogram yesterday, i suspect her pain is musculoskeletal. Her work up here including CXR and troponin are negative. Discussed with Dr. Patria Mane who  Has seen pt as well. agrees with work up and evaluation. Plan to d/c home with pain medications and follow up with primary care doctor for recheck.    Filed Vitals:   11/05/12 0717 11/05/12 0730 11/05/12 0800  BP: 118/81 114/83 114/74  Pulse: 85 78 78  Temp: 98 F (36.7 C)    TempSrc: Oral    Resp: 22 21 17   SpO2: 100% 94% 98%            Lottie Mussel, PA-C 11/05/12 [phone]

## 2012-11-05 NOTE — ED Provider Notes (Signed)
Medical screening examination/treatment/procedure(s) were performed by non-physician practitioner and as supervising physician I was immediately available for consultation/collaboration.   Lyanne Co, MD 11/05/12 1248

## 2012-11-05 NOTE — ED Notes (Signed)
Pt c/o of left sided chest pain that radiates to back. Started this am after walking the dogs. States that this is a new occurrence, denies hx of heart issues. Also c/o of left arm numbness. NAD at this time.

## 2012-11-12 ENCOUNTER — Ambulatory Visit: Payer: Medicare Other | Admitting: Internal Medicine

## 2012-11-12 ENCOUNTER — Telehealth: Payer: Self-pay | Admitting: Internal Medicine

## 2012-11-12 DIAGNOSIS — G4733 Obstructive sleep apnea (adult) (pediatric): Secondary | ICD-10-CM

## 2012-11-12 NOTE — Telephone Encounter (Signed)
lmtcb Sally E Ottinger ° °

## 2012-11-13 NOTE — Telephone Encounter (Signed)
LMTCB with Larita Fife at [facility] to ask about settings and download. She states therapists are out of the office so she will give them the message and have them call us back. Carron Curie, CMA

## 2012-11-13 NOTE — Telephone Encounter (Signed)
cpap giving her a headache she thought it was allergies and was taking something but now she is not taking anything and she is getting headache about an hour after starting cpap and it stays with her til about 11:00 everyday Tobe Sos

## 2012-11-13 NOTE — Telephone Encounter (Signed)
Last OV 09-19-12. I spoke with the pt and she states she is getting a headache when she uses her cpap. She states the headache will start soon after using the cpap and it will wake her up during the night as well. Pt denies feeling like the pressure is high. She states she has tried different settings and she always gets the headache. Pt denies any sinus congestion. Please advise. Brittany [name], CMA Allergies  Allergen Reactions  . Flexeril (Cyclobenzaprine)     Unable to leave home when taking as she is incoherent/unsure where she is at times  . Hydrocodone     REACTION: GI upset

## 2012-11-13 NOTE — Telephone Encounter (Signed)
According to phone notes [facility] has had trouble getting in touch with this patient. Is she still on autoPAP? Was there a download done that I can use for pressure recommendation? Almyra Free was helping her earlier.

## 2012-11-14 NOTE — Telephone Encounter (Signed)
Order sent pt aware

## 2012-11-14 NOTE — Telephone Encounter (Signed)
Order- DME [facility] change CPAP to fixed pressure 11    Dx OSA

## 2012-11-14 NOTE — Telephone Encounter (Signed)
We received a download from [facility]. Pt is still currently on auto setting. I have placed download and message on CY look-at. Carron Curie, CMA

## 2012-11-15 ENCOUNTER — Telehealth: Payer: Self-pay | Admitting: Internal Medicine

## 2012-11-15 NOTE — Telephone Encounter (Signed)
Spoke with pt She states that she was told that she needed to turn her CPAP up to 11  She has tried and can not get it above 5  I advised to wait for [facility] to change the pressure for her I advised that we had just faxed the order yesterday and she should be contacted by them soon Pt verbalized understanding and states nothing further needed

## 2012-11-15 NOTE — Telephone Encounter (Signed)
Pt would like to switch from Dr Cato Mulligan to Ms Orvan Falconer.... that ok with you Dr Cato Mulligan? Pt would like to switch to you, Adline Mango, is that ok with you?

## 2012-11-15 NOTE — Telephone Encounter (Signed)
ok 

## 2012-11-15 NOTE — Telephone Encounter (Signed)
Ok per Dr Swords 

## 2012-11-16 NOTE — Telephone Encounter (Signed)
ok 

## 2012-11-18 ENCOUNTER — Ambulatory Visit (INDEPENDENT_AMBULATORY_CARE_PROVIDER_SITE_OTHER): Payer: Medicare Other | Admitting: Family

## 2012-11-18 ENCOUNTER — Ambulatory Visit (INDEPENDENT_AMBULATORY_CARE_PROVIDER_SITE_OTHER): Payer: Medicare Other | Admitting: Gastroenterology

## 2012-11-18 ENCOUNTER — Encounter: Payer: Self-pay | Admitting: Gastroenterology

## 2012-11-18 ENCOUNTER — Encounter: Payer: Self-pay | Admitting: Family

## 2012-11-18 VITALS — BP 104/62 | HR 79 | Wt 193.0 lb

## 2012-11-18 VITALS — BP 108/64 | HR 80 | Ht 62.0 in | Wt 193.0 lb

## 2012-11-18 DIAGNOSIS — R0789 Other chest pain: Secondary | ICD-10-CM

## 2012-11-18 DIAGNOSIS — F319 Bipolar disorder, unspecified: Secondary | ICD-10-CM

## 2012-11-18 DIAGNOSIS — K5900 Constipation, unspecified: Secondary | ICD-10-CM

## 2012-11-18 DIAGNOSIS — R071 Chest pain on breathing: Secondary | ICD-10-CM

## 2012-11-18 NOTE — Patient Instructions (Signed)
Muscle Strain  Muscle strain occurs when a muscle is stretched beyond its normal length. A small number of muscle fibers generally are torn. This is especially common in athletes. This happens when a sudden, violent force placed on a muscle stretches it too far. Usually, recovery from muscle strain takes 1 to 2 weeks. Complete healing will take 5 to 6 weeks.   HOME CARE INSTRUCTIONS    While awake, apply ice to the sore muscle for the first 2 days after the injury.   Put ice in a plastic bag.   Place a towel between your skin and the bag.   Leave the ice on for 15-20 minutes each hour.   Do not use the strained muscle for several days, until you no longer have pain.   You may wrap the injured area with an elastic bandage for comfort. Be careful not to wrap it too tightly. This may interfere with blood circulation or increase swelling.   Only take over-the-counter or prescription medicines for pain, discomfort, or fever as directed by your caregiver.  SEEK MEDICAL CARE IF:   You have increasing pain or swelling in the injured area.  MAKE SURE YOU:    Understand these instructions.   Will watch your condition.   Will get help right away if you are not doing well or get worse.  Document Released: 06/05/2005 Document Revised: 08/28/2011 Document Reviewed: 06/17/2011  [employer] Patient Information 2014 [employer]

## 2012-11-18 NOTE — Progress Notes (Signed)
History of Present Illness: This is a 57 year old female who relates a worsening of her chronic constipation over the past 2 weeks. On further questioning she increased the use of Flexeril in the past few weeks. She underwent colonoscopy in April 2013 for constipation hematochezia showing internal hemorrhoids and a small hyperplastic colon polyp. She was recommended to use MiraLax on a regular basis at that time however she discontinued it shortly after her colonoscopy. She took several over-the-counter laxatives with no effect and then used Smooth Move with good results last evening.  Current Medications, Allergies, Past Medical History, Past Surgical History, Family History and Social History were reviewed in [employer] record.  Physical Exam: General: Well developed , well nourished, no acute distress Head: Normocephalic and atraumatic Eyes:  sclerae anicteric, EOMI Ears: Normal auditory acuity Mouth: No deformity or lesions Lungs: Clear throughout to auscultation Heart: Regular rate and rhythm; no murmurs, rubs or bruits Abdomen: Soft, non tender and non distended. No masses, hepatosplenomegaly or hernias noted. Normal Bowel sounds Musculoskeletal: Symmetrical with no gross deformities  Pulses:  Normal pulses noted Extremities: No clubbing, cyanosis, edema or deformities noted Neurological: Alert oriented x 4, grossly nonfocal Psychological:  Alert and cooperative. Normal mood and affect  Assessment and Recommendations:  1. Constipation. Recent flare. MiraLax once every other day to twice daily titrated for adequate bowel movements. Reserve other laxatives for refractory symptoms. No further gastrointestinal evaluation at this time. Return to her primary physician for hospital care.  2. Colorectal cancer screening, average risk. Colonoscopy recommended April 2023.

## 2012-11-18 NOTE — Patient Instructions (Addendum)
Please purchase Miralax over the counter and take once every other day, once daily or twice a day for constipation. Please follow up in a year or sooner if symptoms reoccur.   Thank you for choosing [facility] and Dr. Russella Dar

## 2012-11-18 NOTE — Progress Notes (Signed)
Subjective:    Patient ID: Brittany [name], female    DOB: 14-Jan-1956, 57 y.o.   MRN: [id number]  HPI 57 year old [place] American female, nonsmoker, patient of Dr. Cato Mulligan is in today as a hospital followup. She was seen at the emergency department with complaints of left chest wall pain. She underwent EKG, labs, chest x-ray that were all normal. The chest wall pain was found to be musculoskeletal in nature. She no longer has any pain. Patient reports having a mammogram prior to development of the chest pain. She also has a history of migraines, bipolar disorder, chronic back pain. She's currently stable on all medications.  On her last labs from Dr. Abelina Bachelor, she was found to have an elevated calcium at 10.5.  Review of Systems  HENT: Negative.   Respiratory: Negative.   Cardiovascular: Negative.   Gastrointestinal: Negative.   Musculoskeletal: Negative.   Skin: Negative.   Allergic/Immunologic: Negative.   Neurological: Negative.   Hematological: Negative.   Psychiatric/Behavioral: Negative.    Past Medical History  Diagnosis Date  . HYPERLIPIDEMIA   . HEMORRHOIDS-INTERNAL   . URI   . OBSTRUCTIVE SLEEP APNEA   . DISORDER, BIPOLAR NEC   . DEPRESSION   . ALOPECIA   . ALLERGIC RHINITIS   . ACNE NEC   . Anal fissure   . Anxiety   . Arthritis   . Chronic headaches   . Depression   . Bowel obstruction   . GERD (gastroesophageal reflux disease)     History   Social History  . Marital Status: Married    Spouse Name: N/A    Number of Children: 4  . Years of Education: N/A   Occupational History  . disabled    Social History Main Topics  . Smoking status: Former Smoker -- 0.20 packs/day for 7 years    Types: Cigarettes    Quit date: 06/20/1987  . Smokeless tobacco: Never Used  . Alcohol Use: No  . Drug Use: No  . Sexually Active: Not on file   Other Topics Concern  . Not on file   Social History Narrative  . No narrative on file    Past Surgical History   Procedure Laterality Date  . Knee surgery      bilateral  . Breast lumpectomy    . Radical hysterectomy    . Tonsillectomy      Family History  Problem Relation Age of Onset  . Breast cancer    . Lung cancer    . Colon cancer Maternal Uncle   . Colon polyps Maternal Uncle   . Other Paternal Aunt     x 2, blood clotting disorder  . Heart disease Sister   . Irritable bowel syndrome Cousin   . Kidney disease Maternal Uncle   . Esophageal cancer Paternal Uncle   . Rectal cancer Neg Hx   . Stomach cancer Neg Hx     Allergies  Allergen Reactions  . Flexeril (Cyclobenzaprine)     Unable to leave home when taking as she is incoherent/unsure where she is at times  . Hydrocodone     REACTION: GI upset    Current Outpatient Prescriptions on File Prior to Visit  Medication Sig Dispense Refill  . aspirin 325 MG tablet Take 325 mg by mouth daily.      . Azelastine-Fluticasone (DYMISTA) 137-50 MCG/ACT SUSP Place 1 spray into the nose once. 1-2 sprays in each nostril at bed time .  1 Bottle  6  .  cyclobenzaprine (FLEXERIL) 10 MG tablet Take 10 mg by mouth 3 (three) times daily as needed for muscle spasms.      Marland Kitchen ibuprofen (ADVIL,MOTRIN) 600 MG tablet Take 1 tablet (600 mg total) by mouth every 6 (six) hours as needed for pain.  30 tablet  0  . lamoTRIgine (LAMICTAL) 150 MG tablet Take 150 mg by mouth daily.        Marland Kitchen lithium carbonate (ESKALITH) 450 MG CR tablet Take 450 mg by mouth 2 (two) times daily.        . Multiple Vitamin (MULTIVITAMIN WITH MINERALS) TABS Take 1 tablet by mouth daily.      . sertraline (ZOLOFT) 50 MG tablet Take 50 mg by mouth daily.      . traMADol (ULTRAM) 50 MG tablet Take 1 tablet (50 mg total) by mouth every 6 (six) hours as needed for pain.  15 tablet  0   No current facility-administered medications on file prior to visit.    BP 104/62  Pulse 79  Wt 193 lb (87.544 kg)  BMI 35.29 kg/m2  SpO2 98%chart    Objective:   Physical Exam  Constitutional:  She is oriented to person, place, and time. She appears well-developed and well-nourished.  HENT:  Right Ear: External ear normal.  Left Ear: External ear normal.  Nose: Nose normal.  Mouth/Throat: Oropharynx is clear and moist.  Neck: Normal range of motion. Neck supple.  Cardiovascular: Normal rate, regular rhythm and normal heart sounds.   Pulmonary/Chest: Effort normal and breath sounds normal.  Musculoskeletal: Normal range of motion.  Neurological: She is alert and oriented to person, place, and time.  Skin: Skin is warm and dry.  Psychiatric: She has a normal mood and affect.          Assessment & Plan:  Assessment:  1. Chest wall pain-resolved 2. Bipolar disorder 3. Migraine headaches 4. Hypercalcemia  Plan: Continue current medications. I have explained to patient that her pain is not cardiac in nature. She had a full workup at the emergency department with no alarming findings. I believe that she may have sustained a muscle strain from the traumatic mammogram the day prior to her visit to the emergency department. Advised patient to call the office with any questions or concerns. We'll recheck in 3 months and sooner as needed. Due to her high calcium, PTH sent today.

## 2012-11-19 ENCOUNTER — Ambulatory Visit: Payer: Medicare Other | Admitting: Physical Medicine & Rehabilitation

## 2012-11-19 DIAGNOSIS — M19049 Primary osteoarthritis, unspecified hand: Secondary | ICD-10-CM | POA: Diagnosis not present

## 2013-01-06 DIAGNOSIS — Z79899 Other long term (current) drug therapy: Secondary | ICD-10-CM | POA: Diagnosis not present

## 2013-01-06 DIAGNOSIS — F3189 Other bipolar disorder: Secondary | ICD-10-CM | POA: Diagnosis not present

## 2013-01-15 ENCOUNTER — Telehealth: Payer: Self-pay | Admitting: Family

## 2013-01-15 ENCOUNTER — Encounter: Payer: Self-pay | Admitting: Family

## 2013-01-15 ENCOUNTER — Ambulatory Visit (INDEPENDENT_AMBULATORY_CARE_PROVIDER_SITE_OTHER): Payer: Medicare Other | Admitting: Family

## 2013-01-15 NOTE — Telephone Encounter (Signed)
Pt aware when labs have been reviewed I will call her and let her know when to follow up

## 2013-01-15 NOTE — Progress Notes (Signed)
Subjective:    Patient ID: Brittany [name], female    DOB: 14-Apr-1956, 57 y.o.   MRN: [id number]  HPI 57 year old American female, nonsmoker is in with persistent hypercalcemia. At her last office visit, she was supposed to go to the lab to have a parathyroid hormone drawn and forgot. She obtained a calcium an outside facility they came back 11.3 and was advised to followup with primary care provider. Reports they can a multivitamin with calcium, B12, biotin, as well as other prescribed medications. Denies any past history of any parathyroid hormone issues.   Review of Systems  Constitutional: Negative.   HENT: Negative.   Respiratory: Negative.   Cardiovascular: Negative.   Gastrointestinal: Negative.   Endocrine: Negative.   Musculoskeletal: Negative.   Skin: Negative.   Neurological: Negative.   Hematological: Negative.   Psychiatric/Behavioral: Negative.    Past Medical History  Diagnosis Date  . HYPERLIPIDEMIA   . HEMORRHOIDS-INTERNAL   . URI   . OBSTRUCTIVE SLEEP APNEA   . DISORDER, BIPOLAR NEC   . DEPRESSION   . ALOPECIA   . ALLERGIC RHINITIS   . ACNE NEC   . Anal fissure   . Anxiety   . Arthritis   . Chronic headaches   . Depression   . Bowel obstruction   . GERD (gastroesophageal reflux disease)     History   Social History  . Marital Status: Married    Spouse Name: N/A    Number of Children: 4  . Years of Education: N/A   Occupational History  . disabled    Social History Main Topics  . Smoking status: Former Smoker -- 0.20 packs/day for 7 years    Types: Cigarettes    Quit date: 06/20/1987  . Smokeless tobacco: Never Used  . Alcohol Use: No  . Drug Use: No  . Sexually Active: Not on file   Other Topics Concern  . Not on file   Social History Narrative  . No narrative on file    Past Surgical History  Procedure Laterality Date  . Knee surgery      bilateral  . Breast lumpectomy    . Radical hysterectomy    . Tonsillectomy    . Wrist  surgery      Family History  Problem Relation Age of Onset  . Breast cancer    . Lung cancer    . Colon cancer Maternal Uncle   . Colon polyps Maternal Uncle   . Other Paternal Aunt     x 2, blood clotting disorder  . Heart disease Sister   . Irritable bowel syndrome Cousin   . Kidney disease Maternal Uncle   . Esophageal cancer Paternal Uncle   . Rectal cancer Neg Hx   . Stomach cancer Neg Hx     Allergies  Allergen Reactions  . Flexeril (Cyclobenzaprine)     Unable to leave home when taking as she is incoherent/unsure where she is at times  . Hydrocodone     REACTION: GI upset    Current Outpatient Prescriptions on File Prior to Visit  Medication Sig Dispense Refill  . aspirin 325 MG tablet Take 325 mg by mouth daily.      . Azelastine-Fluticasone (DYMISTA) 137-50 MCG/ACT SUSP Place 1 spray into the nose once. 1-2 sprays in each nostril at bed time .  1 Bottle  6  . cyclobenzaprine (FLEXERIL) 10 MG tablet Take 10 mg by mouth 3 (three) times daily as needed for muscle spasms.      Marland Kitchen  ibuprofen (ADVIL,MOTRIN) 600 MG tablet Take 1 tablet (600 mg total) by mouth every 6 (six) hours as needed for pain.  30 tablet  0  . lamoTRIgine (LAMICTAL) 150 MG tablet Take 150 mg by mouth daily.        Marland Kitchen lithium carbonate (ESKALITH) 450 MG CR tablet Take 450 mg by mouth 2 (two) times daily.        . Multiple Vitamin (MULTIVITAMIN WITH MINERALS) TABS Take 1 tablet by mouth daily.      . sertraline (ZOLOFT) 50 MG tablet Take 50 mg by mouth daily.      . traMADol (ULTRAM) 50 MG tablet Take 1 tablet (50 mg total) by mouth every 6 (six) hours as needed for pain.  15 tablet  0   No current facility-administered medications on file prior to visit.    BP 124/76  Pulse 70  Wt 195 lb (88.451 kg)  BMI 35.66 kg/m2  SpO2 98%chart    Objective:   Physical Exam  Constitutional: She is oriented to person, place, and time. She appears well-developed and well-nourished.  Neck: Normal range of motion.  Neck supple.  Cardiovascular: Normal rate, regular rhythm and normal heart sounds.   Pulmonary/Chest: Effort normal and breath sounds normal.  Abdominal: Soft. Bowel sounds are normal.  Musculoskeletal: Normal range of motion.  Neurological: She is alert and oriented to person, place, and time.  Skin: Skin is warm and dry.  Psychiatric: She has a normal mood and affect.          Assessment & Plan:  Assessment: 1. Hypercalcemia 2. Obesity 3. Bipolar disorder  Plan: Again today, ordered parathyroid hormone. Advised patient to go to the lab to have it done. Verbalizes understanding. We'll follow with patient and as of her last discuss further treatment plan at that time.

## 2013-01-15 NOTE — Patient Instructions (Signed)
Parathyroid Hormone This is a test to determine whether PTH levels are responding normally to changes in blood calcium levels. It also helps to distinguish the cause of calcium imbalances, and to evaluate parathyroid function. When calcium blood levels are higher or lower than normal, and when your caregiver may want to determine how well your parathyroid glands are working. Parathyroid hormone (PTH) helps the body maintain stable levels of calcium in the blood. It is part of a 'feedback loop' that includes calcium, PTH, vitamin D, and, to some extent, phosphate and magnesium. Conditions and diseases that disrupt this feedback loop can cause inappropriate elevations or decreases in calcium and PTH levels and lead to symptoms of hypercalcemia (raised blood levels of calcium) or hypocalcemia (low blood levels of calcium).  PTH is produced by four parathyroid glands that are located in the neck beside the thyroid gland. Normally, these glands secrete PTH into the bloodstream in response to low blood calcium levels. Parathyroid hormone then works in three ways to help raise blood calcium levels back to normal. It takes calcium from the body's bone, stimulates the activation of vitamin D in the kidney (which in turn increases the absorption of calcium from the intestines), and suppresses the excretion of calcium in the urine (while encouraging excretion of phosphate). As calcium levels begin to increase in the blood, PTH normally decreases. PREPARATION FOR TEST You should have nothing to eat or drink except for water after midnight on the day of the test or as directed by your caregiver. A blood sample is obtained by inserting a needle into a vein in the arm. NORMAL FINDINGS Conventional Normal  PTH intact (whole)  Assay includes intact PTH  Values (pg/mL)10-65  SI Units (ng/L)10-65  PTH N-terminalN-terminal  Values (pg/mL) 8-24  SI Units (ng/L)8-24  PTH C-terminal  Assay Includes  C-terminal  Values (pg/mL) 50-330  SI Units (ng/L) 50-330  Intact PTH  Midmolecule Ranges for normal findings may vary among different laboratories and hospitals. You should always check with your doctor after having lab work or other tests done to discuss the meaning of your test results and whether your values are considered within normal limits. MEANING OF TEST  Your caregiver will go over the test results with you and discuss the importance and meaning of your results, as well as treatment options and the need for additional tests if necessary. OBTAINING THE TEST RESULTS  It is your responsibility to obtain your test results. Ask the lab or department performing the test when and how you will get your results. Document Released: 07/08/2004 Document Revised: 08/28/2011 Document Reviewed: 05/17/2008 [employer] Patient Information 2014 [employer].  

## 2013-01-15 NOTE — Telephone Encounter (Signed)
PT is calling to inquire about her lab results from 01/15/13. She would like to know when she should schedule a FU appt to discuss these results. Please assist.

## 2013-01-17 ENCOUNTER — Other Ambulatory Visit: Payer: Self-pay | Admitting: Family

## 2013-01-17 DIAGNOSIS — E213 Hyperparathyroidism, unspecified: Secondary | ICD-10-CM

## 2013-01-17 LAB — PTH, INTACT AND CALCIUM: Calcium: 10.6 mg/dL — ABNORMAL HIGH (ref 8.4–10.5)

## 2013-01-20 ENCOUNTER — Encounter: Payer: Self-pay | Admitting: Family

## 2013-01-20 ENCOUNTER — Ambulatory Visit (INDEPENDENT_AMBULATORY_CARE_PROVIDER_SITE_OTHER): Payer: Medicare Other | Admitting: Family

## 2013-01-20 VITALS — BP 100/70 | HR 72 | Wt 195.0 lb

## 2013-01-20 DIAGNOSIS — M25569 Pain in unspecified knee: Secondary | ICD-10-CM

## 2013-01-20 DIAGNOSIS — M1710 Unilateral primary osteoarthritis, unspecified knee: Secondary | ICD-10-CM

## 2013-01-20 DIAGNOSIS — M25561 Pain in right knee: Secondary | ICD-10-CM

## 2013-01-20 DIAGNOSIS — M1711 Unilateral primary osteoarthritis, right knee: Secondary | ICD-10-CM

## 2013-01-20 DIAGNOSIS — IMO0002 Reserved for concepts with insufficient information to code with codable children: Secondary | ICD-10-CM | POA: Diagnosis not present

## 2013-01-20 MED ORDER — METHYLPREDNISOLONE ACETATE 40 MG/ML IJ SUSP: 80.0000 mg | Freq: Once | INTRAMUSCULAR | Status: DC

## 2013-01-20 NOTE — Progress Notes (Signed)
Subjective:    Patient ID: Brittany [name], female    DOB: July 27, 1955, 57 y.o.   MRN: [id number]  HPI 57 year year old AAF, nonsmoker, is in today for a joint injection of the right knee for osteoarthritis.  Reports pain in knee 5/10, worse with movement.    Review of Systems  Constitutional: Negative.   Respiratory: Negative.   Cardiovascular: Negative.   Musculoskeletal: Positive for arthralgias.       Right knee pain  Skin: Negative.   Neurological: Negative.   Psychiatric/Behavioral: Negative.    Past Medical History  Diagnosis Date  . HYPERLIPIDEMIA   . HEMORRHOIDS-INTERNAL   . URI   . OBSTRUCTIVE SLEEP APNEA   . DISORDER, BIPOLAR NEC   . DEPRESSION   . ALOPECIA   . ALLERGIC RHINITIS   . ACNE NEC   . Anal fissure   . Anxiety   . Arthritis   . Chronic headaches   . Depression   . Bowel obstruction   . GERD (gastroesophageal reflux disease)     History   Social History  . Marital Status: Married    Spouse Name: N/A    Number of Children: 4  . Years of Education: N/A   Occupational History  . disabled    Social History Main Topics  . Smoking status: Former Smoker -- 0.20 packs/day for 7 years    Types: Cigarettes    Quit date: 06/20/1987  . Smokeless tobacco: Never Used  . Alcohol Use: No  . Drug Use: No  . Sexually Active: Not on file   Other Topics Concern  . Not on file   Social History Narrative  . No narrative on file    Past Surgical History  Procedure Laterality Date  . Knee surgery      bilateral  . Breast lumpectomy    . Radical hysterectomy    . Tonsillectomy    . Wrist surgery      Family History  Problem Relation Age of Onset  . Breast cancer    . Lung cancer    . Colon cancer Maternal Uncle   . Colon polyps Maternal Uncle   . Other Paternal Aunt     x 2, blood clotting disorder  . Heart disease Sister   . Irritable bowel syndrome Cousin   . Kidney disease Maternal Uncle   . Esophageal cancer Paternal Uncle   .  Rectal cancer Neg Hx   . Stomach cancer Neg Hx     Allergies  Allergen Reactions  . Flexeril (Cyclobenzaprine)     Unable to leave home when taking as she is incoherent/unsure where she is at times  . Hydrocodone     REACTION: GI upset    Current Outpatient Prescriptions on File Prior to Visit  Medication Sig Dispense Refill  . aspirin 325 MG tablet Take 325 mg by mouth daily.      . Azelastine-Fluticasone (DYMISTA) 137-50 MCG/ACT SUSP Place 1 spray into the nose once. 1-2 sprays in each nostril at bed time .  1 Bottle  6  . cyclobenzaprine (FLEXERIL) 10 MG tablet Take 10 mg by mouth 3 (three) times daily as needed for muscle spasms.      Marland Kitchen ibuprofen (ADVIL,MOTRIN) 600 MG tablet Take 1 tablet (600 mg total) by mouth every 6 (six) hours as needed for pain.  30 tablet  0  . lamoTRIgine (LAMICTAL) 150 MG tablet Take 150 mg by mouth daily.        Marland Kitchen  lithium carbonate (ESKALITH) 450 MG CR tablet Take 450 mg by mouth 2 (two) times daily.        . Multiple Vitamin (MULTIVITAMIN WITH MINERALS) TABS Take 1 tablet by mouth daily.      . sertraline (ZOLOFT) 50 MG tablet Take 50 mg by mouth daily.      . traMADol (ULTRAM) 50 MG tablet Take 1 tablet (50 mg total) by mouth every 6 (six) hours as needed for pain.  15 tablet  0   No current facility-administered medications on file prior to visit.    BP 100/70  Pulse 72  Wt 195 lb (88.451 kg)  BMI 35.66 kg/m2chart     Objective:   Physical Exam  Constitutional: She is oriented to person, place, and time. She appears well-developed and well-nourished.  Neck: Normal range of motion. Neck supple. No thyromegaly present.  Cardiovascular: Normal rate, regular rhythm and normal heart sounds.   Pulmonary/Chest: Effort normal and breath sounds normal.  Musculoskeletal: She exhibits tenderness.  Right knee pain to palpation medially and laterally. Pain with flexion.   Neurological: She is alert and oriented to person, place, and time.  Skin: Skin is  warm and dry.  Psychiatric: She has a normal mood and affect.          Assessment & Plan:  Assessment;  1. Right knee Pain  2. Osteoarthritis of the right knee  Plan: Take it easy x 3 days. Call with any questions or concerns. Recheck as scheduled and as needed.

## 2013-01-21 ENCOUNTER — Encounter: Payer: Self-pay | Admitting: Family

## 2013-01-24 MED ORDER — METHYLPREDNISOLONE ACETATE 40 MG/ML IJ SUSP: 80.0000 mg | Freq: Once | INTRAMUSCULAR | Status: DC

## 2013-01-24 NOTE — Addendum Note (Signed)
Addended byAdline Mango B on: 01/24/2013 11:46 AM   Modules accepted: Orders

## 2013-01-29 ENCOUNTER — Encounter: Payer: Self-pay | Admitting: Endocrinology

## 2013-01-29 ENCOUNTER — Ambulatory Visit (INDEPENDENT_AMBULATORY_CARE_PROVIDER_SITE_OTHER): Payer: Medicare Other | Admitting: Endocrinology

## 2013-01-29 VITALS — BP 120/70 | HR 67 | Temp 97.9°F | Resp 12 | Ht 62.0 in | Wt 192.5 lb

## 2013-01-29 DIAGNOSIS — E213 Hyperparathyroidism, unspecified: Secondary | ICD-10-CM

## 2013-01-29 DIAGNOSIS — Z13820 Encounter for screening for osteoporosis: Secondary | ICD-10-CM | POA: Diagnosis not present

## 2013-01-29 DIAGNOSIS — M545 Low back pain, unspecified: Secondary | ICD-10-CM

## 2013-01-29 DIAGNOSIS — M533 Sacrococcygeal disorders, not elsewhere classified: Secondary | ICD-10-CM

## 2013-01-29 NOTE — Progress Notes (Signed)
Patient ID: GENELDA ROARK, female   DOB: [birth year], 57 y.o.   MRN: [id number]  Reason for Appointment:  Hyperparathyroidism, new visit    History of Present Illness:   The patient has had a high calcium level since at least 10/2012 and previously had been high normal Her high calcium was discovered on routine testing She has not had any history of kidney stones, bone fractures or bone pain. She does have chronic low back pain related to disc disease She does not think she has had  a bone density for osteoporosis screening She does not take any calcium supplements She is concerned because her mother having MEN syndrome several years ago with hyperparathyroidism and ? Pituitary tumor  Does not have any other family members with hyperparathyroidism   Lab Results  Component Value Date   CALCIUM 10.6* 01/15/2013   CALCIUM 10.6* 11/05/2012   CALCIUM 10.8* 11/04/2012   CALCIUM 10.9* 10/21/2012   CALCIUM 10.2 12/16/2009   CALCIUM 10.0 03/20/2007   PTH 42.0 01/15/2013   PTH 39.9 11/04/2012      Past Medical History  Diagnosis Date  . HYPERLIPIDEMIA   . HEMORRHOIDS-INTERNAL   . URI   . OBSTRUCTIVE SLEEP APNEA   . DISORDER, BIPOLAR NEC   . DEPRESSION   . ALOPECIA   . ALLERGIC RHINITIS   . ACNE NEC   . Anal fissure   . Anxiety   . Arthritis   . Chronic headaches   . Depression   . Bowel obstruction   . GERD (gastroesophageal reflux disease)     Past Surgical History  Procedure Laterality Date  . Knee surgery      bilateral  . Breast lumpectomy    . Radical hysterectomy    . Tonsillectomy    . Wrist surgery      Family History  Problem Relation Age of Onset  . Breast cancer    . Lung cancer    . Colon cancer Maternal Uncle   . Colon polyps Maternal Uncle   . Other Paternal Aunt     x 2, blood clotting disorder  . Heart disease Sister   . Irritable bowel syndrome Cousin   . Kidney disease Maternal Uncle   . Esophageal cancer Paternal Uncle   . Rectal cancer Neg Hx   .  Stomach cancer Neg Hx    No thy Social History:  reports that she quit smoking about 25 years ago. Her smoking use included Cigarettes. She has a 1.4 pack-year smoking history. She has never used smokeless tobacco. She reports that she does not drink alcohol or use illicit drugs.  Allergies:  Allergies  Allergen Reactions  . Flexeril [Cyclobenzaprine]     Unable to leave home when taking as she is incoherent/unsure where she is at times  . Hydrocodone     REACTION: GI upset      Medication List       This list is accurate as of: 01/29/13  3:40 PM.  Always use your most recent med list.               aspirin 325 MG tablet  Take 325 mg by mouth daily.     Azelastine-Fluticasone 137-50 MCG/ACT Susp  Commonly known as:  DYMISTA  Place 1 spray into the nose once. 1-2 sprays in each nostril at bed time .     cyclobenzaprine 10 MG tablet  Commonly known as:  FLEXERIL  Take 10 mg by mouth 3 (three) times  daily as needed for muscle spasms.     ibuprofen 600 MG tablet  Commonly known as:  ADVIL,MOTRIN  Take 1 tablet (600 mg total) by mouth every 6 (six) hours as needed for pain.     lamoTRIgine 150 MG tablet  Commonly known as:  LAMICTAL  Take 150 mg by mouth daily.     lithium carbonate 450 MG CR tablet  Commonly known as:  ESKALITH  Take 450 mg by mouth 2 (two) times daily.     multivitamin with minerals Tabs tablet  Take 1 tablet by mouth daily.     sertraline 50 MG tablet  Commonly known as:  ZOLOFT  Take 50 mg by mouth daily.     traMADol 50 MG tablet  Commonly known as:  ULTRAM  Take 1 tablet (50 mg total) by mouth every 6 (six) hours as needed for pain.        Review of Systems:  No history of headaches CARDIOLOGY: no history of high blood pressure.            GASTROENTEROLOGY:  no Change in bowel habits.      ENDOCRINOLOGY:  no history of Diabetes.     She does have occasional hot flushes, had hysterectomy 3-4 years ago for fibroids  Has history of low  back pain which is still present Has history of depression? Bipolar disorder treated by psychiatrist with long-term lithium She has had her thyroid levels checked periodically while on lithium    Examination:    BP 120/70  Pulse 67  Temp(Src) 97.9 F (36.6 C)  Resp 12  Ht 5\' 2"  (1.575 m)  Wt 192 lb 8 oz (87.317 kg)  BMI 35.2 kg/m2  SpO2 97%   General Appearance: pleasant, has generalized obesity without cushingoid features           Eyes:  eyes are externally normal with normal cornea, fundus exam shows normal discs and vessels          Neck: The thyroid is  not palpable. There is no lymphadenopathy .    Cardiovascular: Normal  heart sounds, no murmur Respiratory:  Lungs clear Gastrointestinal:  No hepatosplenomegaly     Neurological: REFLEXES: at biceps are normal.     Skin:  no rash or pigmentary lesions        Assessments   Hyperparathyroidism, isolated endocrine disorder without evidence of a MEN syndrome She has only mild increase in calcium within 1 mg of the upper limit Although her PTH level is not above the upper limit is still high normal and consistent with hyperparathyroidism because of high calcium levels Appears not to have any symptoms including bone pain and no known complications such as kidney stones or fracture Since she is over the age of 80 she does not meet the criteria for surgery on the parathyroid Discussed parathyroid glands, normal function, clinical features and effects of hyperparathyroidism and criteria for surgical intervention  DEPRESSION/bipolar disorder on lithium therapy without any evidence of hypothyroidism  PLAN: Will check her bone density to evaluate for any evidence of significant for bone loss and treat as indicated She does need fairly close followup of her calcium because of her family history of MEN syndrome If she meets the criteria for surgery with calcium at least 11.5 consistently will consider parathyroid scan Will also check  free T4 on the next visit as part of evaluation of her pituitary function  Vasiliki Smaldone 01/29/2013, 3:40 PM

## 2013-01-31 ENCOUNTER — Ambulatory Visit: Payer: Medicare Other | Admitting: Family

## 2013-02-18 ENCOUNTER — Ambulatory Visit: Payer: Medicare Other | Admitting: Family

## 2013-02-24 ENCOUNTER — Ambulatory Visit (INDEPENDENT_AMBULATORY_CARE_PROVIDER_SITE_OTHER)
Admission: RE | Admit: 2013-02-24 | Discharge: 2013-02-24 | Disposition: A | Discharge status: Home / Self Care | Payer: Medicare Other | Source: Ambulatory Visit | Attending: Endocrinology | Admitting: Endocrinology

## 2013-02-24 DIAGNOSIS — Z13820 Encounter for screening for osteoporosis: Secondary | ICD-10-CM | POA: Diagnosis not present

## 2013-02-26 ENCOUNTER — Encounter: Payer: Self-pay | Admitting: Psychiatry

## 2013-02-26 ENCOUNTER — Telehealth: Payer: Self-pay | Admitting: Endocrinology

## 2013-02-26 NOTE — Telephone Encounter (Signed)
Called pt and lvm advising her that her bone density test was normal and will recheck your calcium on your next ov as scheduled.

## 2013-02-26 NOTE — Telephone Encounter (Signed)
Please let her know that bone density test was normal, will recheck her calcium on her next visit as scheduled

## 2013-03-04 ENCOUNTER — Telehealth: Payer: Self-pay | Admitting: *Deleted

## 2013-03-04 NOTE — Telephone Encounter (Signed)
Message copied by Hermenia Bers on Tue Mar 04, 2013  8:33 AM ------      Message from: Reather Littler      Created: Tue Mar 04, 2013  8:16 AM       Bone density is normal, please call ------

## 2013-03-04 NOTE — Telephone Encounter (Signed)
Results given.

## 2013-03-12 DIAGNOSIS — F319 Bipolar disorder, unspecified: Secondary | ICD-10-CM | POA: Diagnosis not present

## 2013-03-18 ENCOUNTER — Telehealth: Payer: Self-pay | Admitting: Endocrinology

## 2013-03-18 NOTE — Telephone Encounter (Signed)
Her fatigue is not from calcium which is only slightly high There is no medication for parathyroid disease and she will not need surgery unless she has significant osteoporosis

## 2013-03-18 NOTE — Telephone Encounter (Signed)
Left message on voicemail.

## 2013-03-18 NOTE — Telephone Encounter (Signed)
Pt is complaining of being tired all the time, she wants to know if the high calcium levels could cause that?  She is also wanting to know if she should be on medication for her parathyroid?  Please advise

## 2013-04-16 ENCOUNTER — Encounter: Payer: Self-pay | Admitting: Family

## 2013-04-16 ENCOUNTER — Ambulatory Visit (INDEPENDENT_AMBULATORY_CARE_PROVIDER_SITE_OTHER): Payer: Medicare Other | Admitting: Family

## 2013-04-16 VITALS — BP 100/60 | HR 77 | Wt 198.0 lb

## 2013-04-16 DIAGNOSIS — M545 Low back pain, unspecified: Secondary | ICD-10-CM

## 2013-04-16 DIAGNOSIS — M5416 Radiculopathy, lumbar region: Secondary | ICD-10-CM

## 2013-04-16 DIAGNOSIS — IMO0002 Reserved for concepts with insufficient information to code with codable children: Secondary | ICD-10-CM | POA: Diagnosis not present

## 2013-04-16 DIAGNOSIS — G8929 Other chronic pain: Secondary | ICD-10-CM

## 2013-04-16 MED ORDER — CYCLOBENZAPRINE HCL 10 MG PO TABS: 10.0000 mg | ORAL_TABLET | Freq: Three times a day (TID) | ORAL | Status: DC | PRN

## 2013-04-16 NOTE — Progress Notes (Signed)
Subjective:    Patient ID: Brittany [name], female    DOB: May 12, 1956, 57 y.o.   MRN: [id number]  HPI  57 year old [place] American female, nonsmoker is in with an exacerbation of chronic low back pain. She has a known history of chronic lumbar radiculopathy. Reports she's chronically in pain 8-9/10 on a daily basis to respond well to Flexeril. However, when she takes Flexeril she is unable to do anything outside of her house. Today, her pain as a 10 out of 10, worse with sudden movement. She is unable to sit or stand for an extended period of time. She felt best when she is lying down. She has seen neurosurgery in the past she was unhappy with it was not a candidate for surgery at that time. She had an MRI that showed bulging disc.  Review of Systems  Constitutional: Negative.   Respiratory: Negative.   Cardiovascular: Negative.   Gastrointestinal: Negative.   Endocrine: Negative.   Genitourinary: Negative.   Musculoskeletal: Positive for back pain.  Skin: Negative.   Allergic/Immunologic: Negative.   Neurological: Negative.  Negative for numbness.  Hematological: Negative.   Psychiatric/Behavioral: Negative.    Past Medical History  Diagnosis Date  . HYPERLIPIDEMIA   . HEMORRHOIDS-INTERNAL   . URI   . OBSTRUCTIVE SLEEP APNEA   . DISORDER, BIPOLAR NEC   . DEPRESSION   . ALOPECIA   . ALLERGIC RHINITIS   . ACNE NEC   . Anal fissure   . Anxiety   . Arthritis   . Chronic headaches   . Depression   . Bowel obstruction   . GERD (gastroesophageal reflux disease)     History   Social History  . Marital Status: Married    Spouse Name: N/A    Number of Children: 4  . Years of Education: N/A   Occupational History  . disabled    Social History Main Topics  . Smoking status: Former Smoker -- 0.20 packs/day for 7 years    Types: Cigarettes    Quit date: 06/20/1987  . Smokeless tobacco: Never Used  . Alcohol Use: No  . Drug Use: No  . Sexual Activity: Not on file    Other Topics Concern  . Not on file   Social History Narrative  . No narrative on file    Past Surgical History  Procedure Laterality Date  . Knee surgery      bilateral  . Breast lumpectomy    . Radical hysterectomy    . Tonsillectomy    . Wrist surgery      Family History  Problem Relation Age of Onset  . Breast cancer    . Lung cancer    . Colon cancer Maternal Uncle   . Colon polyps Maternal Uncle   . Other Paternal Aunt     x 2, blood clotting disorder  . Heart disease Sister   . Irritable bowel syndrome Cousin   . Kidney disease Maternal Uncle   . Esophageal cancer Paternal Uncle   . Rectal cancer Neg Hx   . Stomach cancer Neg Hx     Allergies  Allergen Reactions  . Flexeril [Cyclobenzaprine]     Unable to leave home when taking as she is incoherent/unsure where she is at times  . Hydrocodone     REACTION: GI upset    Current Outpatient Prescriptions on File Prior to Visit  Medication Sig Dispense Refill  . aspirin 325 MG tablet Take 325 mg by mouth daily.      Marland Kitchen  Azelastine-Fluticasone (DYMISTA) 137-50 MCG/ACT SUSP Place 1 spray into the nose once. 1-2 sprays in each nostril at bed time .  1 Bottle  6  . ibuprofen (ADVIL,MOTRIN) 600 MG tablet Take 1 tablet (600 mg total) by mouth every 6 (six) hours as needed for pain.  30 tablet  0  . lamoTRIgine (LAMICTAL) 150 MG tablet Take 150 mg by mouth daily.        Marland Kitchen lithium carbonate (ESKALITH) 450 MG CR tablet Take 450 mg by mouth 2 (two) times daily.        . Multiple Vitamin (MULTIVITAMIN WITH MINERALS) TABS Take 1 tablet by mouth daily.      . sertraline (ZOLOFT) 50 MG tablet Take 50 mg by mouth daily.      . traMADol (ULTRAM) 50 MG tablet Take 1 tablet (50 mg total) by mouth every 6 (six) hours as needed for pain.  15 tablet  0   Current Facility-Administered Medications on File Prior to Visit  Medication Dose Route Frequency Provider Last Rate Last Dose  . methylPREDNISolone acetate (DEPO-MEDROL)  injection 80 mg  80 mg Intra-articular Once Baker Pierini, FNP        BP 100/60  Pulse 77  Wt 198 lb (89.812 kg)  BMI 36.21 kg/m2chart    Objective:   Physical Exam  Constitutional: She is oriented to person, place, and time. She appears well-developed and well-nourished.  Neck: Normal range of motion. Neck supple.  Cardiovascular: Normal rate, regular rhythm and normal heart sounds.   Pulmonary/Chest: Effort normal and breath sounds normal.  Abdominal: Soft.  Musculoskeletal: She exhibits tenderness. She exhibits no edema.  Patient has about a 20 flexion at the hip before pain is elicited. Negative straight leg raise. Approximately a 10 rotation bilaterally before pain is elicited. Pedal pulses 2 out of 2  Neurological: She is alert and oriented to person, place, and time. She has normal reflexes. She displays normal reflexes. No cranial nerve deficit. Coordination normal.  Skin: Skin is warm and dry.  Psychiatric: She has a normal mood and affect.          Assessment & Plan:  Assessment: 1. Lumbar radiculopathy 2. Chronic low back pain  Plan: Renewed Flexeril prescription. MRI of the lumbar spine. Consider referral to neurosurgery pending results. Low back strengthening exercises daily. Follow with the patient and the results of her MRI and sooner as needed. No driving with Flexeril.

## 2013-04-16 NOTE — Patient Instructions (Signed)
Back Exercises Back exercises help treat and prevent back injuries. The goal of back exercises is to increase the strength of your abdominal and back muscles and the flexibility of your back. These exercises should be started when you no longer have back pain. Back exercises include:  Pelvic Tilt. Lie on your back with your knees bent. Tilt your pelvis until the lower part of your back is against the floor. Hold this position 5 to 10 sec and repeat 5 to 10 times.  Knee to Chest. Pull first 1 knee up against your chest and hold for 20 to 30 seconds, repeat this with the other knee, and then both knees. This may be done with the other leg straight or bent, whichever feels better.  Sit-Ups or Curl-Ups. Bend your knees 90 degrees. Start with tilting your pelvis, and do a partial, slow sit-up, lifting your trunk only 30 to 45 degrees off the floor. Take at least 2 to 3 seconds for each sit-up. Do not do sit-ups with your knees out straight. If partial sit-ups are difficult, simply do the above but with only tightening your abdominal muscles and holding it as directed.  Hip-Lift. Lie on your back with your knees flexed 90 degrees. Push down with your feet and shoulders as you raise your hips a couple inches off the floor; hold for 10 seconds, repeat 5 to 10 times.  Back arches. Lie on your stomach, propping yourself up on bent elbows. Slowly press on your hands, causing an arch in your low back. Repeat 3 to 5 times. Any initial stiffness and discomfort should lessen with repetition over time.  Shoulder-Lifts. Lie face down with arms beside your body. Keep hips and torso pressed to floor as you slowly lift your head and shoulders off the floor. Do not overdo your exercises, especially in the beginning. Exercises may cause you some mild back discomfort which lasts for a few minutes; however, if the pain is more severe, or lasts for more than 15 minutes, do not continue exercises until you see your caregiver.  Improvement with exercise therapy for back problems is slow.  See your caregivers for assistance with developing a proper back exercise program. Document Released: 07/13/2004 Document Revised: 08/28/2011 Document Reviewed: 04/06/2011 [employer] Patient Information 2014 [employer].  

## 2013-04-18 DIAGNOSIS — F3189 Other bipolar disorder: Secondary | ICD-10-CM | POA: Diagnosis not present

## 2013-04-18 DIAGNOSIS — Z79899 Other long term (current) drug therapy: Secondary | ICD-10-CM | POA: Diagnosis not present

## 2013-04-21 ENCOUNTER — Telehealth: Payer: Self-pay | Admitting: Internal Medicine

## 2013-04-21 DIAGNOSIS — G4733 Obstructive sleep apnea (adult) (pediatric): Secondary | ICD-10-CM

## 2013-04-21 NOTE — Telephone Encounter (Signed)
I spoke with pt. Aware referral will be placed. Nothing further needed

## 2013-04-26 ENCOUNTER — Ambulatory Visit
Admission: RE | Admit: 2013-04-26 | Discharge: 2013-04-26 | Disposition: A | Discharge status: Home / Self Care | Payer: Medicare Other | Source: Ambulatory Visit | Attending: Family | Admitting: Family

## 2013-04-26 DIAGNOSIS — M5416 Radiculopathy, lumbar region: Secondary | ICD-10-CM

## 2013-04-26 DIAGNOSIS — M47817 Spondylosis without myelopathy or radiculopathy, lumbosacral region: Secondary | ICD-10-CM | POA: Diagnosis not present

## 2013-04-26 DIAGNOSIS — G8929 Other chronic pain: Secondary | ICD-10-CM

## 2013-04-28 ENCOUNTER — Telehealth: Payer: Self-pay | Admitting: Family

## 2013-04-28 NOTE — Telephone Encounter (Signed)
error 

## 2013-04-28 NOTE — Telephone Encounter (Signed)
Advise labs are normal.

## 2013-05-01 ENCOUNTER — Ambulatory Visit: Payer: Medicare Other | Admitting: Endocrinology

## 2013-05-05 DIAGNOSIS — F319 Bipolar disorder, unspecified: Secondary | ICD-10-CM | POA: Diagnosis not present

## 2013-05-06 ENCOUNTER — Telehealth: Payer: Self-pay | Admitting: Family

## 2013-05-06 DIAGNOSIS — M5416 Radiculopathy, lumbar region: Secondary | ICD-10-CM

## 2013-05-06 NOTE — Telephone Encounter (Signed)
Pt would referral to dr tat. [facility]. Pt would like this done by the end of the day. Pt is in pain and would like appt asap. pls advise.

## 2013-05-06 NOTE — Telephone Encounter (Signed)
Referral placed.

## 2013-05-13 ENCOUNTER — Telehealth: Payer: Self-pay | Admitting: Family

## 2013-05-13 NOTE — Telephone Encounter (Signed)
Pt would like a referral to dr tat neurologist for back pain. Pt has mri

## 2013-05-13 NOTE — Telephone Encounter (Signed)
Please change location per pt request. She states that she had a terrible experience at [facility]. Thanks

## 2013-06-05 ENCOUNTER — Emergency Department (HOSPITAL_COMMUNITY): Payer: Medicare Other

## 2013-06-05 ENCOUNTER — Encounter (HOSPITAL_COMMUNITY): Payer: Self-pay | Admitting: Emergency Medicine

## 2013-06-05 ENCOUNTER — Emergency Department (HOSPITAL_COMMUNITY)
Admission: EM | Admit: 2013-06-05 | Discharge: 2013-06-05 | Disposition: A | Discharge status: Home / Self Care | Payer: Medicare Other | Attending: Emergency Medicine | Admitting: Emergency Medicine

## 2013-06-05 DIAGNOSIS — R4701 Aphasia: Secondary | ICD-10-CM | POA: Insufficient documentation

## 2013-06-05 DIAGNOSIS — Z87891 Personal history of nicotine dependence: Secondary | ICD-10-CM | POA: Diagnosis not present

## 2013-06-05 DIAGNOSIS — Z9089 Acquired absence of other organs: Secondary | ICD-10-CM | POA: Insufficient documentation

## 2013-06-05 DIAGNOSIS — F319 Bipolar disorder, unspecified: Secondary | ICD-10-CM | POA: Insufficient documentation

## 2013-06-05 DIAGNOSIS — F411 Generalized anxiety disorder: Secondary | ICD-10-CM | POA: Insufficient documentation

## 2013-06-05 DIAGNOSIS — M6281 Muscle weakness (generalized): Secondary | ICD-10-CM | POA: Insufficient documentation

## 2013-06-05 DIAGNOSIS — Z8639 Personal history of other endocrine, nutritional and metabolic disease: Secondary | ICD-10-CM | POA: Insufficient documentation

## 2013-06-05 DIAGNOSIS — Z872 Personal history of diseases of the skin and subcutaneous tissue: Secondary | ICD-10-CM | POA: Insufficient documentation

## 2013-06-05 DIAGNOSIS — R1110 Vomiting, unspecified: Secondary | ICD-10-CM | POA: Diagnosis not present

## 2013-06-05 DIAGNOSIS — Z8679 Personal history of other diseases of the circulatory system: Secondary | ICD-10-CM | POA: Insufficient documentation

## 2013-06-05 DIAGNOSIS — Z862 Personal history of diseases of the blood and blood-forming organs and certain disorders involving the immune mechanism: Secondary | ICD-10-CM | POA: Diagnosis not present

## 2013-06-05 DIAGNOSIS — G43909 Migraine, unspecified, not intractable, without status migrainosus: Secondary | ICD-10-CM | POA: Diagnosis not present

## 2013-06-05 DIAGNOSIS — G4733 Obstructive sleep apnea (adult) (pediatric): Secondary | ICD-10-CM | POA: Diagnosis not present

## 2013-06-05 DIAGNOSIS — Z8709 Personal history of other diseases of the respiratory system: Secondary | ICD-10-CM | POA: Insufficient documentation

## 2013-06-05 DIAGNOSIS — Z8719 Personal history of other diseases of the digestive system: Secondary | ICD-10-CM | POA: Diagnosis not present

## 2013-06-05 DIAGNOSIS — Z79899 Other long term (current) drug therapy: Secondary | ICD-10-CM | POA: Insufficient documentation

## 2013-06-05 DIAGNOSIS — M129 Arthropathy, unspecified: Secondary | ICD-10-CM | POA: Diagnosis not present

## 2013-06-05 DIAGNOSIS — R51 Headache: Secondary | ICD-10-CM | POA: Diagnosis not present

## 2013-06-05 DIAGNOSIS — Z7982 Long term (current) use of aspirin: Secondary | ICD-10-CM | POA: Insufficient documentation

## 2013-06-05 DIAGNOSIS — R519 Headache, unspecified: Secondary | ICD-10-CM

## 2013-06-05 LAB — CBC WITH DIFFERENTIAL/PLATELET
Basophils Absolute: 0.1 10*3/uL (ref 0.0–0.1)
Basophils Relative: 1 % (ref 0–1)
Eosinophils Absolute: 0.3 10*3/uL (ref 0.0–0.7)
Eosinophils Relative: 3 % (ref 0–5)
HCT: 37.4 % (ref 36.0–46.0)
Hemoglobin: 12.9 g/dL (ref 12.0–15.0)
Lymphocytes Relative: 35 % (ref 12–46)
Lymphs Abs: 2.8 10*3/uL (ref 0.7–4.0)
MCH: 30.8 pg (ref 26.0–34.0)
MCHC: 34.5 g/dL (ref 30.0–36.0)
MCV: 89.3 fL (ref 78.0–100.0)
Monocytes Absolute: 0.5 10*3/uL (ref 0.1–1.0)
Monocytes Relative: 7 % (ref 3–12)
Neutro Abs: 4.5 10*3/uL (ref 1.7–7.7)
Platelets: 385 10*3/uL (ref 150–400)
RDW: 13.3 % (ref 11.5–15.5)

## 2013-06-05 LAB — COMPREHENSIVE METABOLIC PANEL
AST: 22 U/L (ref 0–37)
Albumin: 4.1 g/dL (ref 3.5–5.2)
BUN: 11 mg/dL (ref 6–23)
Calcium: 11.1 mg/dL — ABNORMAL HIGH (ref 8.4–10.5)
Chloride: 103 mEq/L (ref 96–112)
Creatinine, Ser: 0.69 mg/dL (ref 0.50–1.10)
Total Bilirubin: <0.1 mg/dL — ABNORMAL LOW (ref 0.3–1.2)

## 2013-06-05 LAB — LITHIUM LEVEL: Lithium Lvl: 0.59 mEq/L — ABNORMAL LOW (ref 0.80–1.40)

## 2013-06-05 MED ORDER — SODIUM CHLORIDE 0.9 % IV SOLN
INTRAVENOUS | Status: DC
Start: 2013-06-05 — End: 2013-06-06

## 2013-06-05 MED ORDER — METOCLOPRAMIDE HCL 5 MG/ML IJ SOLN
10.0000 mg | Freq: Once | INTRAMUSCULAR | Status: AC
  Administered 2013-06-05: 10 mg via INTRAVENOUS
  Filled 2013-06-05: qty 2

## 2013-06-05 MED ORDER — ASPIRIN 81 MG PO CHEW: 81.0000 mg | CHEWABLE_TABLET | Freq: Every day | ORAL | Status: DC

## 2013-06-05 MED ORDER — SODIUM CHLORIDE 0.9 % IV SOLN: 1000.0000 mL | INTRAVENOUS | Status: DC

## 2013-06-05 MED ORDER — MORPHINE SULFATE 4 MG/ML IJ SOLN
4.0000 mg | Freq: Once | INTRAMUSCULAR | Status: AC
  Administered 2013-06-05: 4 mg via INTRAVENOUS
  Filled 2013-06-05: qty 1

## 2013-06-05 MED ORDER — SODIUM CHLORIDE 0.9 % IV SOLN
1000.0000 mL | Freq: Once | INTRAVENOUS | Status: AC
  Administered 2013-06-05: 1000 mL via INTRAVENOUS

## 2013-06-05 NOTE — ED Notes (Addendum)
Pt states she has had slurred speech, difficulty forming words and bilateral leg weakness for past 4 days along with L sided HA. Pt states she also threw up 1x today. Pt also complains of sob, speaking complete sentences.

## 2013-06-05 NOTE — ED Provider Notes (Signed)
CSN: [id number]     Arrival date & time 06/05/13  1410 History   First MD Initiated Contact with Patient 06/05/13 1615     Chief Complaint  Patient presents with  . Emesis  . Headache  . Aphasia    HPI Patient reports some slurred speech, difficulty finding words a common objects and generalized bilateral leg weakness over the past 4 days with associated left-sided headache.  She does report a history of migraine headaches.  Nausea today.  Vomited times one.  She denies cough or congestion.  No chest pain.  She denies exertional shortness of breath.  No orthopnea.  No history of stroke or TIA.  States she's had some intermittent issues with walking secondary to balance but states that her walking seems to be better now.  She states otherwise compliant with her medications.   Past Medical History  Diagnosis Date  . HYPERLIPIDEMIA   . HEMORRHOIDS-INTERNAL   . URI   . OBSTRUCTIVE SLEEP APNEA   . DISORDER, BIPOLAR NEC   . DEPRESSION   . ALOPECIA   . ALLERGIC RHINITIS   . ACNE NEC   . Anal fissure   . Anxiety   . Arthritis   . Chronic headaches   . Depression   . Bowel obstruction   . GERD (gastroesophageal reflux disease)    Past Surgical History  Procedure Laterality Date  . Knee surgery      bilateral  . Breast lumpectomy    . Radical hysterectomy    . Tonsillectomy    . Wrist surgery     Family History  Problem Relation Age of Onset  . Breast cancer    . Lung cancer    . Colon cancer Maternal Uncle   . Colon polyps Maternal Uncle   . Other Paternal Aunt     x 2, blood clotting disorder  . Heart disease Sister   . Irritable bowel syndrome Cousin   . Kidney disease Maternal Uncle   . Esophageal cancer Paternal Uncle   . Rectal cancer Neg Hx   . Stomach cancer Neg Hx    History  Substance Use Topics  . Smoking status: Former Smoker -- 0.20 packs/day for 7 years    Types: Cigarettes    Quit date: 06/20/1987  . Smokeless tobacco: Never Used  . Alcohol Use: No    OB History   Grav Para Term Preterm Abortions TAB SAB Ect Mult Living                 Review of Systems  All other systems reviewed and are negative.    Allergies  Flexeril and Hydrocodone  Home Medications   Current Outpatient Rx  Name  Route  Sig  Dispense  Refill  . acetaminophen (TYLENOL) 650 MG CR tablet   Oral   Take 650 mg by mouth every 8 (eight) hours as needed for pain.         . Carbamazepine (EQUETRO) 300 MG CP12   Oral   Take 600 mg by mouth daily.         . cetirizine (ZYRTEC) 10 MG tablet   Oral   Take 10 mg by mouth daily.         . cyclobenzaprine (FLEXERIL) 10 MG tablet   Oral   Take 10 mg by mouth 3 (three) times daily as needed for muscle spasms.         Marland Kitchen ibuprofen (ADVIL,MOTRIN) 200 MG tablet   Oral  Take 200 mg by mouth every 6 (six) hours as needed.         . lamoTRIgine (LAMICTAL) 150 MG tablet   Oral   Take 300 mg by mouth daily.          Marland Kitchen lithium carbonate (ESKALITH) 450 MG CR tablet   Oral   Take 450 mg by mouth daily.          . methocarbamol (ROBAXIN) 500 MG tablet   Oral   Take 500 mg by mouth every 8 (eight) hours as needed for muscle spasms.         . Multiple Vitamin (MULTIVITAMIN WITH MINERALS) TABS   Oral   Take 1 tablet by mouth daily.         Marland Kitchen OVER THE COUNTER MEDICATION   Oral   Take 1 capsule by mouth 2 (two) times daily. hydroxycut max for women         . sertraline (ZOLOFT) 50 MG tablet   Oral   Take 50 mg by mouth daily.         Marland Kitchen aspirin 81 MG chewable tablet   Oral   Chew 1 tablet (81 mg total) by mouth daily.   30 tablet   0    BP 118/74  Pulse 76  Temp(Src) 97.7 F (36.5 C) (Oral)  Resp 18  SpO2 94% Physical Exam  Nursing note and vitals reviewed. Constitutional: She is oriented to person, place, and time. She appears well-developed and well-nourished. No distress.  HENT:  Head: Normocephalic and atraumatic.  Eyes: EOM are normal. Pupils are equal, round, and  reactive to light.  Neck: Normal range of motion.  Cardiovascular: Normal rate, regular rhythm and normal heart sounds.   Pulmonary/Chest: Effort normal and breath sounds normal.  Abdominal: Soft. She exhibits no distension. There is no tenderness.  Musculoskeletal: Normal range of motion.  Neurological: She is alert and oriented to person, place, and time.  5/5 strength in major muscle groups of  bilateral upper and lower extremities. Speech normal. No facial asymetry.   Skin: Skin is warm and dry.  Psychiatric: She has a normal mood and affect. Judgment normal.    ED Course  Procedures (including critical care time) Labs Review Labs Reviewed  COMPREHENSIVE METABOLIC PANEL - Abnormal; Notable for the following:    Glucose, Bld 102 (*)    Calcium 11.1 (*)    Total Bilirubin <0.1 (*)    All other components within normal limits  LITHIUM LEVEL - Abnormal; Notable for the following:    Lithium Lvl 0.59 (*)    All other components within normal limits  CBC WITH DIFFERENTIAL   Imaging Review Ct Head Wo Contrast  06/05/2013   CLINICAL DATA:  Headache, aphagia.  EXAM: CT HEAD WITHOUT CONTRAST  TECHNIQUE: Contiguous axial images were obtained from the base of the skull through the vertex without intravenous contrast.  COMPARISON:  None.  FINDINGS: Bony calvarium appears intact. No mass effect or midline shift is noted. Ventricular size is within normal limits. There is no evidence of mass lesion, hemorrhage or acute infarction.  IMPRESSION: No gross intracranial abnormality seen.   Electronically Signed   By: Roque Lias M.D.   On: 06/05/2013 16:31   Mr Brain Wo Contrast  06/05/2013   CLINICAL DATA:  Expressive aphasia with headache. Bipolar disorder. Hyperlipidemia, and dizziness.  EXAM: MRI HEAD WITHOUT CONTRAST  TECHNIQUE: Multiplanar, multiecho pulse sequences of the brain and surrounding structures were obtained without intravenous  contrast.  COMPARISON:  CT head earlier in the day.   FINDINGS: No evidence for acute infarction, hemorrhage, mass lesion, hydrocephalus, or extra-axial fluid. Normal cerebral volume. Minimal periventricular white matter signal abnormality is nonspecific, noted particularly in the right posterior temporal periventricular region, potentially representing early chronic microvascular ischemic change. No evidence for large vessel or lacunar infarct. Flow voids are maintained throughout the carotid, basilar, and vertebral arteries. There are no areas of chronic hemorrhage. Pituitary, pineal, and cerebellar tonsils unremarkable. No upper cervical lesions. Visualized calvarium, skull base, and upper cervical osseous structures unremarkable. Scalp and extracranial soft tissues, orbits, sinuses, and mastoids show no acute process.  IMPRESSION: MRI essentially within normal limits. No acute stroke or hemorrhage. Question early white matter disease.   Electronically Signed   By: Davonna Belling M.D.   On: 06/05/2013 20:34  I personally reviewed the imaging tests through PACS system I reviewed available ER/hospitalization records through the EMR   EKG Interpretation   None       MDM   1. Headache    9:28 PM Patient feels much better at this time.  MRI of her brain demonstrates no evidence of central lesion or stroke.  Discharge home in good condition.  Maybe, and migraine.  Outpatient PCP followup.  She understands to return to the ER for new or worsening symptoms    Lyanne Co, MD 06/05/13 2128

## 2013-06-23 DIAGNOSIS — F319 Bipolar disorder, unspecified: Secondary | ICD-10-CM | POA: Diagnosis not present

## 2013-07-01 DIAGNOSIS — M48061 Spinal stenosis, lumbar region without neurogenic claudication: Secondary | ICD-10-CM | POA: Diagnosis not present

## 2013-07-01 DIAGNOSIS — M549 Dorsalgia, unspecified: Secondary | ICD-10-CM | POA: Diagnosis not present

## 2013-07-16 ENCOUNTER — Other Ambulatory Visit: Payer: Self-pay

## 2013-07-16 DIAGNOSIS — L570 Actinic keratosis: Secondary | ICD-10-CM | POA: Diagnosis not present

## 2013-07-16 DIAGNOSIS — I789 Disease of capillaries, unspecified: Secondary | ICD-10-CM | POA: Diagnosis not present

## 2013-07-21 DIAGNOSIS — L219 Seborrheic dermatitis, unspecified: Secondary | ICD-10-CM | POA: Diagnosis not present

## 2013-08-25 ENCOUNTER — Encounter: Payer: Self-pay | Admitting: Family

## 2013-08-25 ENCOUNTER — Ambulatory Visit (INDEPENDENT_AMBULATORY_CARE_PROVIDER_SITE_OTHER): Payer: Medicare Other | Admitting: Family

## 2013-08-25 VITALS — BP 108/64 | HR 87 | Temp 98.3°F | Wt 203.0 lb

## 2013-08-25 DIAGNOSIS — K219 Gastro-esophageal reflux disease without esophagitis: Secondary | ICD-10-CM | POA: Diagnosis not present

## 2013-08-25 DIAGNOSIS — F319 Bipolar disorder, unspecified: Secondary | ICD-10-CM | POA: Diagnosis not present

## 2013-08-25 DIAGNOSIS — R0789 Other chest pain: Secondary | ICD-10-CM

## 2013-08-25 MED ORDER — OMEPRAZOLE 40 MG PO CPDR: 40.0000 mg | DELAYED_RELEASE_CAPSULE | Freq: Every day | ORAL | Status: DC

## 2013-08-25 NOTE — Patient Instructions (Signed)
Gastroesophageal Reflux Disease, Adult  Gastroesophageal reflux disease (GERD) happens when acid from your stomach flows up into the esophagus. When acid comes in contact with the esophagus, the acid causes soreness (inflammation) in the esophagus. Over time, GERD may create small holes (ulcers) in the lining of the esophagus.  CAUSES   · Increased body weight. This puts pressure on the stomach, making acid rise from the stomach into the esophagus.  · Smoking. This increases acid production in the stomach.  · Drinking alcohol. This causes decreased pressure in the lower esophageal sphincter (valve or ring of muscle between the esophagus and stomach), allowing acid from the stomach into the esophagus.  · Late evening meals and a full stomach. This increases pressure and acid production in the stomach.  · A malformed lower esophageal sphincter.  Sometimes, no cause is found.  SYMPTOMS   · Burning pain in the lower part of the mid-chest behind the breastbone and in the mid-stomach area. This may occur twice a week or more often.  · Trouble swallowing.  · Sore throat.  · Dry cough.  · Asthma-like symptoms including chest tightness, shortness of breath, or wheezing.  DIAGNOSIS   Your caregiver may be able to diagnose GERD based on your symptoms. In some cases, X-rays and other tests may be done to check for complications or to check the condition of your stomach and esophagus.  TREATMENT   Your caregiver may recommend over-the-counter or prescription medicines to help decrease acid production. Ask your caregiver before starting or adding any new medicines.   HOME CARE INSTRUCTIONS   · Change the factors that you can control. Ask your caregiver for guidance concerning weight loss, quitting smoking, and alcohol consumption.  · Avoid foods and drinks that make your symptoms worse, such as:  · Caffeine or alcoholic drinks.  · Chocolate.  · Peppermint or mint flavorings.  · Garlic and onions.  · Spicy foods.  · Citrus fruits,  such as oranges, lemons, or limes.  · Tomato-based foods such as sauce, chili, salsa, and pizza.  · Fried and fatty foods.  · Avoid lying down for the 3 hours prior to your bedtime or prior to taking a nap.  · Eat small, frequent meals instead of large meals.  · Wear loose-fitting clothing. Do not wear anything tight around your waist that causes pressure on your stomach.  · Raise the head of your bed 6 to 8 inches with wood blocks to help you sleep. Extra pillows will not help.  · Only take over-the-counter or prescription medicines for pain, discomfort, or fever as directed by your caregiver.  · Do not take aspirin, ibuprofen, or other nonsteroidal anti-inflammatory drugs (NSAIDs).  SEEK IMMEDIATE MEDICAL CARE IF:   · You have pain in your arms, neck, jaw, teeth, or back.  · Your pain increases or changes in intensity or duration.  · You develop nausea, vomiting, or sweating (diaphoresis).  · You develop shortness of breath, or you faint.  · Your vomit is green, yellow, black, or looks like coffee grounds or blood.  · Your stool is red, bloody, or black.  These symptoms could be signs of other problems, such as heart disease, gastric bleeding, or esophageal bleeding.  MAKE SURE YOU:   · Understand these instructions.  · Will watch your condition.  · Will get help right away if you are not doing well or get worse.  Document Released: 03/15/2005 Document Revised: 08/28/2011 Document Reviewed: 12/23/2010  Exit[employer]® Patient   Information ©2014 [employer].

## 2013-08-25 NOTE — Progress Notes (Signed)
Subjective:    Patient ID: Brittany [name], female    DOB: 1955-07-27, 58 y.o.   MRN: [id number]  HPI 58 y.o. Black female presents today with chief complaint of "GERD, chest pain and I need a cardiology appointment". Pt states that she has had heartburn for 6 years but it keeps getting worse. She states that her heartburn is constant, she has tried to get relief by avoiding spicy foods, by not eating late at night but they are not helping. She has tried eating yogurt but has not gotten relief. She states she has taken her husbands Prilosec and it helped. Pt also states that she has been to the emergency department twice for chest pain, and they "put chest probes on me and took labs" but just diagnosed her with a "headache". She adamantly demands an appointment with cardiology and states "I want them to run all the test, they are not going to find anything by just putting stickers on my chest. I know something is wrong and I will keep going to doctors until someone finds it". She denies fever, fatigue and change in appetite.      Review of Systems  Constitutional: Negative.   Eyes: Negative.   Respiratory: Positive for cough.   Cardiovascular: Positive for chest pain.  Gastrointestinal:       States "I have reflex for 6 years".    Endocrine: Negative.   Genitourinary: Negative.   Musculoskeletal: Positive for back pain.  Skin: Negative.   Allergic/Immunologic: Negative.   Neurological: Positive for headaches.  Hematological: Negative.   Psychiatric/Behavioral: Negative.    Past Medical History  Diagnosis Date  . HYPERLIPIDEMIA   . HEMORRHOIDS-INTERNAL   . URI   . OBSTRUCTIVE SLEEP APNEA   . DISORDER, BIPOLAR NEC   . DEPRESSION   . ALOPECIA   . ALLERGIC RHINITIS   . ACNE NEC   . Anal fissure   . Anxiety   . Arthritis   . Chronic headaches   . Depression   . Bowel obstruction   . GERD (gastroesophageal reflux disease)     History   Social History  . Marital Status:  Married    Spouse Name: N/A    Number of Children: 4  . Years of Education: N/A   Occupational History  . disabled    Social History Main Topics  . Smoking status: Former Smoker -- 0.20 packs/day for 7 years    Types: Cigarettes    Quit date: 06/20/1987  . Smokeless tobacco: Never Used  . Alcohol Use: No  . Drug Use: No  . Sexual Activity: Not on file   Other Topics Concern  . Not on file   Social History Narrative  . No narrative on file    Past Surgical History  Procedure Laterality Date  . Knee surgery      bilateral  . Breast lumpectomy    . Radical hysterectomy    . Tonsillectomy    . Wrist surgery      Family History  Problem Relation Age of Onset  . Breast cancer    . Lung cancer    . Colon cancer Maternal Uncle   . Colon polyps Maternal Uncle   . Other Paternal Aunt     x 2, blood clotting disorder  . Heart disease Sister   . Irritable bowel syndrome Cousin   . Kidney disease Maternal Uncle   . Esophageal cancer Paternal Uncle   . Rectal cancer Neg Hx   .  Stomach cancer Neg Hx     Allergies  Allergen Reactions  . Flexeril [Cyclobenzaprine]     Unable to leave home when taking as she is incoherent/unsure where she is at times  . Hydrocodone     REACTION: GI upset    Current Outpatient Prescriptions on File Prior to Visit  Medication Sig Dispense Refill  . acetaminophen (TYLENOL) 650 MG CR tablet Take 650 mg by mouth every 8 (eight) hours as needed for pain.      Marland Kitchen aspirin 81 MG chewable tablet Chew 1 tablet (81 mg total) by mouth daily.  30 tablet  0  . Carbamazepine (EQUETRO) 300 MG CP12 Take 600 mg by mouth daily.      . cetirizine (ZYRTEC) 10 MG tablet Take 10 mg by mouth daily.      . cyclobenzaprine (FLEXERIL) 10 MG tablet Take 10 mg by mouth 3 (three) times daily as needed for muscle spasms.      Marland Kitchen ibuprofen (ADVIL,MOTRIN) 200 MG tablet Take 200 mg by mouth every 6 (six) hours as needed.      . lamoTRIgine (LAMICTAL) 150 MG tablet Take  300 mg by mouth daily.       Marland Kitchen lithium carbonate (ESKALITH) 450 MG CR tablet Take 450 mg by mouth daily.       . methocarbamol (ROBAXIN) 500 MG tablet Take 500 mg by mouth every 8 (eight) hours as needed for muscle spasms.      . Multiple Vitamin (MULTIVITAMIN WITH MINERALS) TABS Take 1 tablet by mouth daily.      Marland Kitchen OVER THE COUNTER MEDICATION Take 1 capsule by mouth 2 (two) times daily. hydroxycut max for women      . sertraline (ZOLOFT) 50 MG tablet Take 50 mg by mouth daily.       Current Facility-Administered Medications on File Prior to Visit  Medication Dose Route Frequency Provider Last Rate Last Dose  . methylPREDNISolone acetate (DEPO-MEDROL) injection 80 mg  80 mg Intra-articular Once Timoteo Gaul, FNP        BP 108/64  Pulse 87  Temp(Src) 98.3 F (36.8 C) (Oral)  Wt 203 lb (92.08 kg)chart    Objective:   Physical Exam  Constitutional: She is oriented to person, place, and time. She appears well-developed and well-nourished. She is active.  Cardiovascular: Normal rate, regular rhythm, normal heart sounds and normal pulses.   Pulmonary/Chest: Effort normal and breath sounds normal.  Abdominal: Soft. Normal appearance and bowel sounds are normal.  Neurological: She is alert and oriented to person, place, and time.  Skin: Skin is warm, dry and intact.  Psychiatric: Her mood appears anxious. Her affect is labile. Her speech is rapid and/or pressured. She is agitated and hyperactive.          Assessment & Plan:  58 y.o. Black female presents today with chief complaint of "GERD and I need a cardiology appointment".  -GERD: Start Prilosec 40mg  po Qday  - Pt has already made an appointment for GI - Cardiology appointment given despite instructions that symptoms are most likely due to GERD and should resolve over time after starting Prilosec.   Education: Eat smaller meals, avoid trigger foods such as spicy foods and caffeine.   Follow up: as needed pending GI and  Cardiology consults.

## 2013-08-25 NOTE — Progress Notes (Signed)
Pre visit review using our clinic review tool, if applicable. No additional management support is needed unless otherwise documented below in the visit note. 

## 2013-08-26 DIAGNOSIS — F319 Bipolar disorder, unspecified: Secondary | ICD-10-CM | POA: Diagnosis not present

## 2013-08-27 ENCOUNTER — Telehealth: Payer: Self-pay | Admitting: Family

## 2013-08-27 MED ORDER — OMEPRAZOLE 40 MG PO CPDR: 40.0000 mg | DELAYED_RELEASE_CAPSULE | Freq: Every day | ORAL | Status: DC

## 2013-08-27 NOTE — Telephone Encounter (Signed)
Pt needs new rx prilosec 40 mg #90 w/refills [facility] [phone]

## 2013-08-28 DIAGNOSIS — L570 Actinic keratosis: Secondary | ICD-10-CM | POA: Diagnosis not present

## 2013-09-03 ENCOUNTER — Encounter: Payer: Self-pay | Admitting: Gastroenterology

## 2013-09-03 ENCOUNTER — Ambulatory Visit (INDEPENDENT_AMBULATORY_CARE_PROVIDER_SITE_OTHER): Payer: Medicare Other | Admitting: Gastroenterology

## 2013-09-03 VITALS — BP 110/74 | HR 71 | Ht 62.0 in | Wt 194.2 lb

## 2013-09-03 DIAGNOSIS — K219 Gastro-esophageal reflux disease without esophagitis: Secondary | ICD-10-CM

## 2013-09-03 DIAGNOSIS — F319 Bipolar disorder, unspecified: Secondary | ICD-10-CM | POA: Diagnosis not present

## 2013-09-03 NOTE — Patient Instructions (Addendum)
Continue Prilosec daily.   Patient advised to avoid spicy, acidic, citrus, chocolate, mints, fruit and fruit juices.  Limit the intake of caffeine, alcohol and Soda.  Don't exercise too soon after eating.  Don't lie down within 3-4 hours of eating.  Elevate the head of your bed.  Follow up with your Primary Care Physician for ongoing treatment of GERD.   Thank you for choosing me and [facility].  Pricilla Riffle. Dagoberto Ligas., MD., [facility] Reg[facility]

## 2013-09-03 NOTE — Progress Notes (Signed)
    History of Present Illness: This is a 58 year old female recently diagnosed with GERD. She was placed on Prilosec and has had complete resolution of her symptoms. She has questions about long-term management. Denies weight loss, abdominal pain, constipation, diarrhea, change in stool caliber, melena, hematochezia, nausea, vomiting, dysphagia, chest pain.  Current Medications, Allergies, Past Medical History, Past Surgical History, Family History and Social History were reviewed in [employer] record.  Physical Exam: General: Well developed , well nourished, no acute distress Head: Normocephalic and atraumatic Eyes:  sclerae anicteric, EOMI Ears: Normal auditory acuity Mouth: No deformity or lesions Lungs: Clear throughout to auscultation Heart: Regular rate and rhythm; no murmurs, rubs or bruits Abdomen: Soft, non tender and non distended. No masses, hepatosplenomegaly or hernias noted. Normal Bowel sounds Musculoskeletal: Symmetrical with no gross deformities  Pulses:  Normal pulses noted Extremities: No clubbing, cyanosis, edema or deformities noted Neurological: Alert oriented x 4, grossly nonfocal Psychological:  Alert and cooperative. Normal mood and affect  Assessment and Recommendations:  1. GERD. Standard antireflux measures long-term. PPI daily long-term. Answered patient's questions regarding GERD management. Since his problem is mild and stable she is advised to followup with her primary physician for ongoing care and for PPI refills.

## 2013-09-09 ENCOUNTER — Ambulatory Visit: Payer: Medicare Other | Admitting: Cardiovascular Disease

## 2013-09-15 ENCOUNTER — Encounter: Payer: Self-pay | Admitting: Cardiovascular Disease

## 2013-09-15 ENCOUNTER — Ambulatory Visit (INDEPENDENT_AMBULATORY_CARE_PROVIDER_SITE_OTHER): Payer: Medicare Other | Admitting: Cardiovascular Disease

## 2013-09-15 VITALS — BP 110/70 | HR 67 | Wt 194.0 lb

## 2013-09-15 DIAGNOSIS — R079 Chest pain, unspecified: Secondary | ICD-10-CM | POA: Insufficient documentation

## 2013-09-15 DIAGNOSIS — R0609 Other forms of dyspnea: Secondary | ICD-10-CM

## 2013-09-15 DIAGNOSIS — E785 Hyperlipidemia, unspecified: Secondary | ICD-10-CM

## 2013-09-15 DIAGNOSIS — R0989 Other specified symptoms and signs involving the circulatory and respiratory systems: Secondary | ICD-10-CM

## 2013-09-15 DIAGNOSIS — R0600 Dyspnea, unspecified: Secondary | ICD-10-CM

## 2013-09-15 NOTE — Patient Instructions (Signed)
Your physician recommends that you schedule a follow-up appointment in:  AS  NEEDED Your physician recommends that you continue on your current medications as directed. Please refer to the Current Medication list given to you today.  Your physician has requested that you have an echocardiogram. Echocardiography is a painless test that uses sound waves to create images of your heart. It provides your doctor with information about the size and shape of your heart and how well your heart's chambers and valves are working. This procedure takes approximately one hour. There are no restrictions for this procedure.  Your physician has requested that you have a lexiscan myoview. For further information please visit [email or web address]. Please follow instruction sheet, as given.  

## 2013-09-15 NOTE — Assessment & Plan Note (Signed)
Seems functional normal exam f/u echo to assess RV/LV function

## 2013-09-15 NOTE — Progress Notes (Signed)
Patient ID: Brittany [name], female   DOB: 09-27-1955, 58 y.o.   MRN: [id number] 58 yo patient of Dr Bridget Hartshorn referred for chest pain and dyspnea.  Two recent ER visits for same  She saw her primary 08/25/13 and insisted on a cardiology appt as she was Not happy with ER care.  ER r/o, CXR normal labs ok  She had atypical chest pain in 2012 and had normal stress echo.  She has a husband who is sick and gets overwhelmed By caring for him.  She has chronic back pain and wears a brace  Unable to walk on treadmill  Dyspnea is exertional No PND or orthopnea Chronic mild LE edema No history of DVT No fever sputum cough or pleurisy Pain turns into numbness and tingling down left arm  Quit smoking in 89     ROS: Denies fever, malais, weight loss, blurry vision, decreased visual acuity, cough, sputum, SOB, hemoptysis, pleuritic pain, palpitaitons, heartburn, abdominal pain, melena, lower extremity edema, claudication, or rash.  All other systems reviewed and negative  General: Affect appropriate Healthy:  appears stated age 4: normal Neck supple with no adenopathy JVP normal no bruits no thyromegaly Lungs clear with no wheezing and good diaphragmatic motion Heart:  S1/S2 no murmur, no rub, gallop or click PMI normal Abdomen: benighn, BS positve, no tenderness, no AAA no bruit.  No HSM or HJR Distal pulses intact with no bruits No edema Neuro non-focal Skin warm and dry No muscular weakness   Current Outpatient Prescriptions  Medication Sig Dispense Refill  . acetaminophen (TYLENOL) 650 MG CR tablet Take 650 mg by mouth every 8 (eight) hours as needed for pain.      Marland Kitchen aspirin 325 MG tablet Take 325 mg by mouth daily.      . Carbamazepine (EQUETRO) 300 MG CP12 Take 600 mg by mouth daily.      . cetirizine (ZYRTEC) 10 MG tablet Take 10 mg by mouth daily.      . cyclobenzaprine (FLEXERIL) 10 MG tablet Take 10 mg by mouth 3 (three) times daily as needed for muscle spasms.      Marland Kitchen ibuprofen  (ADVIL,MOTRIN) 200 MG tablet Take 200 mg by mouth every 6 (six) hours as needed.      . lamoTRIgine (LAMICTAL) 150 MG tablet Take 300 mg by mouth daily.       . methocarbamol (ROBAXIN) 500 MG tablet Take 500 mg by mouth every 8 (eight) hours as needed for muscle spasms.      . Multiple Vitamin (MULTIVITAMIN WITH MINERALS) TABS Take 1 tablet by mouth daily.      Marland Kitchen omeprazole (PRILOSEC) 40 MG capsule Take 1 capsule (40 mg total) by mouth daily.  90 capsule  1  . OVER THE COUNTER MEDICATION Take 1 capsule by mouth 2 (two) times daily. hydroxycut max for women      . sertraline (ZOLOFT) 50 MG tablet Take 50 mg by mouth daily.       No current facility-administered medications for this visit.   Facility-Administered Medications Ordered in Other Visits  Medication Dose Route Frequency Provider Last Rate Last Dose  . methylPREDNISolone acetate (DEPO-MEDROL) injection 80 mg  80 mg Intra-articular Once Timoteo Gaul, FNP        Allergies  Flexeril and Hydrocodone  Electrocardiogram:  SR rate 67 LAE otherwise normal   Assessment and Plan

## 2013-09-15 NOTE — Assessment & Plan Note (Signed)
Cholesterol is at goal.  Continue current dose of statin and diet Rx.  No myalgias or side effects.  F/U  LFT's in 6 months. Lab Results  Component Value Date   LDLCALC 121* 03/20/2007

## 2013-09-15 NOTE — Assessment & Plan Note (Signed)
She is very concerned Current w/u reassuring but symptoms persistent  Cannot walk on treadmill f/u lexiscan myovue

## 2013-09-16 DIAGNOSIS — M19049 Primary osteoarthritis, unspecified hand: Secondary | ICD-10-CM | POA: Diagnosis not present

## 2013-09-17 DIAGNOSIS — F319 Bipolar disorder, unspecified: Secondary | ICD-10-CM | POA: Diagnosis not present

## 2013-09-29 ENCOUNTER — Ambulatory Visit (HOSPITAL_COMMUNITY): Payer: Medicare Other | Attending: Cardiovascular Disease | Admitting: Radiology

## 2013-09-29 ENCOUNTER — Ambulatory Visit (HOSPITAL_BASED_OUTPATIENT_CLINIC_OR_DEPARTMENT_OTHER): Payer: Medicare Other | Admitting: Radiology

## 2013-09-29 VITALS — BP 124/82 | HR 64 | Ht 62.0 in | Wt 189.0 lb

## 2013-09-29 DIAGNOSIS — R0600 Dyspnea, unspecified: Secondary | ICD-10-CM

## 2013-09-29 DIAGNOSIS — R079 Chest pain, unspecified: Secondary | ICD-10-CM | POA: Insufficient documentation

## 2013-09-29 DIAGNOSIS — R0609 Other forms of dyspnea: Secondary | ICD-10-CM | POA: Diagnosis not present

## 2013-09-29 DIAGNOSIS — R0989 Other specified symptoms and signs involving the circulatory and respiratory systems: Secondary | ICD-10-CM | POA: Diagnosis not present

## 2013-09-29 DIAGNOSIS — R0602 Shortness of breath: Secondary | ICD-10-CM | POA: Insufficient documentation

## 2013-09-29 MED ORDER — TECHNETIUM TC 99M SESTAMIBI GENERIC - CARDIOLITE
11.0000 | Freq: Once | INTRAVENOUS | Status: AC | PRN
  Administered 2013-09-29: 11 via INTRAVENOUS

## 2013-09-29 MED ORDER — TECHNETIUM TC 99M SESTAMIBI GENERIC - CARDIOLITE
33.0000 | Freq: Once | INTRAVENOUS | Status: AC | PRN
  Administered 2013-09-29: 33 via INTRAVENOUS

## 2013-09-29 MED ORDER — REGADENOSON 0.4 MG/5ML IV SOLN
0.4000 mg | Freq: Once | INTRAVENOUS | Status: AC
  Administered 2013-09-29: 0.4 mg via INTRAVENOUS

## 2013-09-29 NOTE — Progress Notes (Signed)
Echocardiogram performed.  

## 2013-09-29 NOTE — Progress Notes (Signed)
[facility] [address] [place], [place] [address] [phone]    Cardiology Nuclear Med Study  Brittany [name] is a 57 y.o. female     MRN : [id number]     DOB: October 07, 1955  Procedure Date: 09/29/2013  Nuclear Med Background Indication for Stress Test:  Evaluation for Ischemia History:  No known CAD, Stress Echo 2012 EF 65%, Pending Echo 09/2103 Cardiac Risk Factors: History of Smoking and Lipids  Symptoms:  Chest Pain, DOE and SOB   Nuclear Pre-Procedure Caffeine/Decaff Intake:  None > 12 hrs NPO After: 5:30pm   Lungs:  clear O2 Sat: 99% on room air. IV 0.9% NS with Angio Cath:  22g  IV Site: R Antecubital x 1, tolerated well IV Started by:  Irven Baltimore, RN  Chest Size (in):  38 Cup Size: DD  Height: 5\' 2"  (1.575 m)  Weight:  189 lb (85.73 kg)  BMI:  Body mass index is 34.56 kg/(m^2). Tech Comments:  N/A    Nuclear Med Study 1 or 2 day study: 1 day  Stress Test Type:  Treadmill/Lexiscan  Reading MD: N/A  Order Authorizing Provider:  Jenkins Rouge, MD  Resting Radionuclide: Technetium 90m Sestamibi  Resting Radionuclide Dose: 11.0 mCi   Stress Radionuclide:  Technetium 38m Sestamibi  Stress Radionuclide Dose: 33.0 mCi           Stress Protocol Rest HR: 64 Stress HR: 106  Rest BP: 124/82 Stress BP: 123/67  Exercise Time (min): n/a METS: n/a           Dose of Adenosine (mg):  n/a Dose of Lexiscan: 0.4 mg  Dose of Atropine (mg): n/a Dose of Dobutamine: n/a mcg/kg/min (at max HR)  Stress Test Technologist: Glade Lloyd, BS-ES  Nuclear Technologist:  Annye Rusk, CNMT     Rest Procedure:  Myocardial perfusion imaging was performed at rest 45 minutes following the intravenous administration of Technetium 82m Sestamibi. Rest ECG: NSR - Normal EKG  Stress Procedure:  The patient received IV Lexiscan 0.4 mg over 15-seconds with concurrent low level exercise and then Technetium 73m Sestamibi was injected at 30-seconds while the patient  continued walking one more minute.  Quantitative spect images were obtained after a 45-minute delay. During the infusion, the patient complained of slight SOB.  This resolved in recovery.  Stress ECG: No significant change from baseline ECG  QPS Raw Data Images:  Mild breast attenuation.  Normal left ventricular size. Stress Images:  There is decreased uptake in the anterior wall. Rest Images:  There is decreased uptake in the anterior wall. Subtraction (SDS):  There is a fixed anteriour defect that is most consistent with breast attenuation. Transient Ischemic Dilatation (Normal <1.22):  1.11 Lung/Heart Ratio (Normal <0.45):  0.33  Quantitative Gated Spect Images QGS EDV:  92 ml QGS ESV:  34 ml  Impression Exercise Capacity:  Lexiscan with low level exercise. BP Response:  Normal blood pressure response. Clinical Symptoms:  There is dyspnea. ECG Impression:  No significant ECG changes with Lexiscan. Comparison with Prior Nuclear Study: No previous nuclear study performed  Overall Impression:  Low risk stress nuclear study with a small area of distal anterior breast attenuation artifact. No ischemia.  LV Ejection Fraction: 63%.  LV Wall Motion:  NL LV Function; NL Wall Motion  Pixie Casino, MD, [facility] Board Certified in Nuclear Cardiology Attending Cardiologist [place]

## 2013-09-30 ENCOUNTER — Telehealth: Payer: Self-pay | Admitting: Internal Medicine

## 2013-09-30 DIAGNOSIS — G4733 Obstructive sleep apnea (adult) (pediatric): Secondary | ICD-10-CM

## 2013-09-30 NOTE — Telephone Encounter (Signed)
Attempted to call LMTCB x1  Pt last seen 09/19/12 Order for CPAP placed-DME [facility]  Pressure setting-11

## 2013-09-30 NOTE — Telephone Encounter (Signed)
Pt is requesting that an order be placed for a new cpap machine.  She was told last year that it had to be 5 years to obtain a new machine, it's now 5 years. Pt last seen 09/19/12 so she is due for her yearly f/u.  I transferred her to the front desk to get that appointment scheduled.  CY is it ok with you to place order with aps for new cpap machine?? Please advise, thanks

## 2013-09-30 NOTE — Telephone Encounter (Signed)
Order has been placed per CY. lmtcb x1

## 2013-09-30 NOTE — Telephone Encounter (Signed)
Ok to order to her DME for replacement for old CPAP machine at current pressure,humidifier, supplies, mask of choice.  Dx OSA

## 2013-10-01 DIAGNOSIS — H2510 Age-related nuclear cataract, unspecified eye: Secondary | ICD-10-CM | POA: Diagnosis not present

## 2013-10-01 DIAGNOSIS — H43399 Other vitreous opacities, unspecified eye: Secondary | ICD-10-CM | POA: Diagnosis not present

## 2013-10-01 NOTE — Telephone Encounter (Signed)
Pt returned call

## 2013-10-01 NOTE — Telephone Encounter (Signed)
Spoke with pt. She is aware. Nothing further needed 

## 2013-10-02 ENCOUNTER — Ambulatory Visit (INDEPENDENT_AMBULATORY_CARE_PROVIDER_SITE_OTHER): Payer: Medicare Other | Admitting: Internal Medicine

## 2013-10-02 ENCOUNTER — Encounter: Payer: Self-pay | Admitting: Internal Medicine

## 2013-10-02 VITALS — BP 112/68 | HR 70 | Ht 62.0 in | Wt 196.6 lb

## 2013-10-02 DIAGNOSIS — J302 Other seasonal allergic rhinitis: Secondary | ICD-10-CM

## 2013-10-02 DIAGNOSIS — G4733 Obstructive sleep apnea (adult) (pediatric): Secondary | ICD-10-CM

## 2013-10-02 DIAGNOSIS — J3089 Other allergic rhinitis: Secondary | ICD-10-CM

## 2013-10-02 DIAGNOSIS — J309 Allergic rhinitis, unspecified: Secondary | ICD-10-CM | POA: Diagnosis not present

## 2013-10-02 MED ORDER — AZELASTINE-FLUTICASONE 137-50 MCG/ACT NA SUSP: 2.0000 | Freq: Every day | NASAL | Status: DC

## 2013-10-02 NOTE — Patient Instructions (Signed)
Sample Dymista nasal spray    Try 1-2 puffs each nostril once daily at bedtime  Keep appointment with [facility] and call as needed

## 2013-10-02 NOTE — Progress Notes (Signed)
08/19/12- 42 yoF former smoker seeking to re-establish for hx OSA complicated by BiPolar/depression. Former patient-last seen 2010. Was using CAP 8/ SMS with good compliance and control. She lost weight, stopped snoring and dropped off CPAP then. Says she couldn't deal with water gurgling in the hose. Since then has regained weight, snoring loudly so husband leaves bedroom. Daytime sleepiness if sitting quietly. New problem- perennial rhinitis, intense around newly mowed grass and house dust. Never tested. Notes wheezing when she lies down. No hx ENT surgery or asthma. Smoked 1 ppw x 7 years. Family hx of COPD, food allergies. Bedtime 12MN-3AM, latency 2-3 hours, waking 8-10 x/ night before up 7-9 AM.  NPSG 08/25/08- AHI 10.8/ hr, weighed 190 lbs.  09/19/12- 89 yoF former smoker seeking to re-establish for hx OSA complicated by BiPolar/depression FOLLOWS FOR: review sleep study with patient. Has difficulty sleeping away from her familiar bedroom .NPSG 09/04/12- AHI 10.1 per hour, loud snoring, weight 190 pounds. Mild obstructive sleep apnea.  10/02/13- 67 yoF former smoker followed for OSA complicated by BiPolar/depression FOLLOWS FOR: review sleep study with patient. CPAP 11/ [facility].compliance and control has been good. Can't sleep without it. Perennial nasal stuffiness. Occasional wheeze when she laughs  ROS-see HPI Constitutional:   No-   weight loss, night sweats, fevers, chills, +fatigue, lassitude. HEENT:   No-  headaches, difficulty swallowing, tooth/dental problems, sore throat,       No- sneezing, itching, ear ache, +nasal congestion, +post nasal drip,  CV:  No-   chest pain, orthopnea, PND, swelling in lower extremities, anasarca,                                  dizziness, palpitations Resp: No-   shortness of breath with exertion or at rest.              No-   productive cough,  No non-productive cough,  No- coughing up of blood.              No-   change in color of mucus.  + wheezing.    Skin: No-   rash or lesions. GI:  No-   heartburn, indigestion, abdominal pain, nausea, vomiting, GU: . MS:  + joint pain or swelling.  Neuro-     nothing unusual Psych:  No- change in mood or affect. + depression or anxiety.  No memory loss.  OBJ- Physical Exam General- Alert, Oriented, Affect-appropriate, Distress- none acute, overweight Skin- rash-none, lesions- none, excoriation- none Lymphadenopathy- none Head- atraumatic            Eyes- Gross vision intact, PERRLA, conjunctivae and secretions clear            Ears- Hearing, canals-normal            Nose-  +pale turbinate ededma, no-Septal dev, mucus, polyps, erosion, perforation             Throat- Mallampati III , mucosa clear , drainage- none, tonsils- atrophic Neck- flexible , trachea midline, no stridor , thyroid nl, carotid no bruit Chest - symmetrical excursion , unlabored           Heart/CV- RRR , no murmur , no gallop  , no rub, nl s1 s2                           - JVD- none , edema- none, stasis changes-  none, varices- none           Lung- clear to P&A, wheeze- none, cough- none , dullness-none, rub- none           Chest wall-  Abd-  Br/ Gen/ Rectal- Not done, not indicated Extrem- cyanosis- none, clubbing, none, atrophy- none, strength- nl. Cane Neuro- grossly intact to observation

## 2013-10-13 ENCOUNTER — Telehealth: Payer: Self-pay | Admitting: Family

## 2013-10-13 NOTE — Telephone Encounter (Signed)
Pt states she need a referral to a kidney specialist, pt had mri done on upper back, pt states they found incidentally bilateral cyst on both kidneys.

## 2013-10-13 NOTE — Telephone Encounter (Signed)
MRI does not make reference to a cyst of the kidney.

## 2013-10-13 NOTE — Telephone Encounter (Signed)
Pleases advise

## 2013-10-14 ENCOUNTER — Encounter: Payer: Self-pay | Admitting: *Deleted

## 2013-10-15 NOTE — Telephone Encounter (Signed)
Brittany [name] had an MRI done at the [facility] in [place] and states that on the report it reads "incidental findings of bilateral cysts" on kidneys. Brittany [name] states that she allows the [facility] to only tx her for her disability related to her back and sometimes for acute issues, therefore she needs the referral from her PCP at this office.   Brittany [name], will you please call Brittany [name] to schedule follow up with [facility] for referral.

## 2013-10-15 NOTE — Telephone Encounter (Signed)
Pt has been sch for 10-17-13 at 215pm

## 2013-10-17 ENCOUNTER — Ambulatory Visit: Payer: Medicare Other | Admitting: Family

## 2013-10-17 ENCOUNTER — Telehealth: Payer: Self-pay | Admitting: Family

## 2013-10-17 DIAGNOSIS — N281 Cyst of kidney, acquired: Secondary | ICD-10-CM

## 2013-10-17 NOTE — Telephone Encounter (Signed)
Pt cancelled appt for today. and would like referral asap to neph. Thank youi!!

## 2013-10-17 NOTE — Telephone Encounter (Signed)
Referral placed.

## 2013-10-17 NOTE — Telephone Encounter (Signed)
Pt wanted to know if she has to come in to discuss her referral to a kidney doctor or can you just refer her she said  her husband is in the hospital and she been running.

## 2013-10-21 ENCOUNTER — Telehealth: Payer: Self-pay | Admitting: Internal Medicine

## 2013-10-21 ENCOUNTER — Telehealth: Payer: Self-pay | Admitting: Family

## 2013-10-21 DIAGNOSIS — N281 Cyst of kidney, acquired: Secondary | ICD-10-CM

## 2013-10-21 MED ORDER — AZELASTINE-FLUTICASONE 137-50 MCG/ACT NA SUSP: 2.0000 | Freq: Every day | NASAL | Status: DC

## 2013-10-21 NOTE — Telephone Encounter (Signed)
Call received from [place] stating the pt needs a referral to Urology for the diagnosis of kidney cyst

## 2013-10-21 NOTE — Telephone Encounter (Signed)
Spoke with pt. Aware RX has been sent. Nothing further needed 

## 2013-10-22 NOTE — Telephone Encounter (Signed)
Referral placed.

## 2013-10-23 ENCOUNTER — Telehealth: Payer: Self-pay | Admitting: Internal Medicine

## 2013-10-23 MED ORDER — FLUTICASONE PROPIONATE 50 MCG/ACT NA SUSP: NASAL | Status: DC

## 2013-10-23 MED ORDER — AZELASTINE HCL 0.1 % NA SOLN: NASAL | Status: DC

## 2013-10-23 NOTE — Telephone Encounter (Signed)
Spoke with the pt  Dymista is not covered by her ins  Per Joellen Jersey- okay to call in flonase and astelin 1 to 2 sprays at bedtime  Pt aware of this and okay with the change I have sent rxs to [facility] per her request

## 2013-10-29 DIAGNOSIS — F319 Bipolar disorder, unspecified: Secondary | ICD-10-CM | POA: Diagnosis not present

## 2013-10-30 DIAGNOSIS — Q619 Cystic kidney disease, unspecified: Secondary | ICD-10-CM | POA: Diagnosis not present

## 2013-11-01 NOTE — Assessment & Plan Note (Signed)
Plan-she will meet with her DME company to discuss replacement of old CPAP machine. Continue pressure at [place]

## 2013-11-01 NOTE — Assessment & Plan Note (Signed)
Persistent nasal congestion Plan-try Dymista

## 2013-11-06 DIAGNOSIS — Q619 Cystic kidney disease, unspecified: Secondary | ICD-10-CM | POA: Diagnosis not present

## 2013-11-07 ENCOUNTER — Other Ambulatory Visit: Payer: Self-pay | Admitting: Urology

## 2013-11-07 DIAGNOSIS — N281 Cyst of kidney, acquired: Secondary | ICD-10-CM

## 2013-11-13 DIAGNOSIS — Z780 Asymptomatic menopausal state: Secondary | ICD-10-CM | POA: Diagnosis not present

## 2013-11-13 DIAGNOSIS — Z1231 Encounter for screening mammogram for malignant neoplasm of breast: Secondary | ICD-10-CM | POA: Diagnosis not present

## 2013-11-13 DIAGNOSIS — Z803 Family history of malignant neoplasm of breast: Secondary | ICD-10-CM | POA: Diagnosis not present

## 2013-11-18 ENCOUNTER — Telehealth: Payer: Self-pay | Admitting: Family

## 2013-11-18 MED ORDER — OMEPRAZOLE 40 MG PO CPDR: 40.0000 mg | DELAYED_RELEASE_CAPSULE | Freq: Every day | ORAL | Status: DC

## 2013-11-18 NOTE — Telephone Encounter (Signed)
Pt needs refill on omeprazole 40 mg #90 w/refills  [facility]

## 2013-11-18 NOTE — Telephone Encounter (Signed)
Done

## 2013-11-20 ENCOUNTER — Ambulatory Visit (HOSPITAL_COMMUNITY)
Admission: RE | Admit: 2013-11-20 | Discharge: 2013-11-20 | Disposition: A | Discharge status: Home / Self Care | Payer: Medicare Other | Source: Ambulatory Visit | Attending: Urology | Admitting: Urology

## 2013-11-20 DIAGNOSIS — N281 Cyst of kidney, acquired: Secondary | ICD-10-CM | POA: Diagnosis not present

## 2013-11-20 MED ORDER — GADOBENATE DIMEGLUMINE 529 MG/ML IV SOLN
20.0000 mL | Freq: Once | INTRAVENOUS | Status: AC | PRN
  Administered 2013-11-20: 20 mL via INTRAVENOUS

## 2013-11-25 DIAGNOSIS — F319 Bipolar disorder, unspecified: Secondary | ICD-10-CM | POA: Diagnosis not present

## 2013-12-04 DIAGNOSIS — N390 Urinary tract infection, site not specified: Secondary | ICD-10-CM | POA: Diagnosis not present

## 2013-12-04 DIAGNOSIS — Q619 Cystic kidney disease, unspecified: Secondary | ICD-10-CM | POA: Diagnosis not present

## 2013-12-12 ENCOUNTER — Encounter: Payer: Self-pay | Admitting: Family

## 2013-12-12 ENCOUNTER — Ambulatory Visit (INDEPENDENT_AMBULATORY_CARE_PROVIDER_SITE_OTHER): Payer: Medicare Other | Admitting: Family

## 2013-12-12 VITALS — BP 96/62 | Temp 98.4°F | Wt 195.0 lb

## 2013-12-12 DIAGNOSIS — J301 Allergic rhinitis due to pollen: Secondary | ICD-10-CM | POA: Diagnosis not present

## 2013-12-12 DIAGNOSIS — F319 Bipolar disorder, unspecified: Secondary | ICD-10-CM | POA: Diagnosis not present

## 2013-12-12 DIAGNOSIS — R5381 Other malaise: Secondary | ICD-10-CM

## 2013-12-12 DIAGNOSIS — R5383 Other fatigue: Secondary | ICD-10-CM

## 2013-12-12 LAB — TSH: TSH: 0.92 u[IU]/mL (ref 0.35–4.50)

## 2013-12-12 NOTE — Patient Instructions (Signed)

## 2013-12-12 NOTE — Progress Notes (Signed)
Pre visit review using our clinic review tool, if applicable. No additional management support is needed unless otherwise documented below in the visit note. 

## 2013-12-12 NOTE — Progress Notes (Signed)
Subjective:    Patient ID: Brittany [name], female    DOB: 01-22-1956, 58 y.o.   MRN: [id number]  HPI  58 year old nonsmoking African American female presents with complaints of fatigue, increased emotional stressors (in context of husband's illness, planning family reunion, grandchildren not visiting this year). She questions if her emotional response and continued hot flashes could be managed by a low-dose estrogen patch she saw on Dr. Irena Cords show. However, she has a significant FH of breast cancer: her mother, maternal grandmother, and 3 aunts all had BC.   Her GYN care is managed by Dr. Dellis Filbert. She has an appointment next month for follow up.   Review of Systems  Constitutional: Positive for fatigue.  HENT: Negative.   Eyes: Negative.   Respiratory: Negative.   Cardiovascular: Negative.   Gastrointestinal: Negative.   Endocrine: Negative.   Genitourinary: Negative.   Musculoskeletal: Negative.   Skin: Negative.   Allergic/Immunologic: Negative.   Neurological: Negative.   Hematological: Negative.   Psychiatric/Behavioral: Negative.    Past Medical History  Diagnosis Date  . HYPERLIPIDEMIA   . HEMORRHOIDS-INTERNAL   . URI   . OBSTRUCTIVE SLEEP APNEA   . DISORDER, BIPOLAR NEC   . DEPRESSION   . ALOPECIA   . ALLERGIC RHINITIS   . ACNE NEC   . Anal fissure   . Anxiety   . Arthritis   . Chronic headaches   . Depression   . Bowel obstruction   . GERD (gastroesophageal reflux disease)     History   Social History  . Marital Status: Married    Spouse Name: N/A    Number of Children: 4  . Years of Education: N/A   Occupational History  . disabled    Social History Main Topics  . Smoking status: Former Smoker -- 0.20 packs/day for 7 years    Types: Cigarettes    Quit date: 06/20/1987  . Smokeless tobacco: Never Used  . Alcohol Use: No  . Drug Use: No  . Sexual Activity: Not on file   Other Topics Concern  . Not on file   Social History Narrative  . No  narrative on file    Past Surgical History  Procedure Laterality Date  . Knee surgery      bilateral  . Breast lumpectomy    . Radical hysterectomy    . Tonsillectomy    . Wrist surgery      Family History  Problem Relation Age of Onset  . Breast cancer    . Lung cancer    . Colon cancer Maternal Uncle   . Colon polyps Maternal Uncle   . Other Paternal Aunt     x 2, blood clotting disorder  . Heart disease Sister   . Irritable bowel syndrome Cousin   . Kidney disease Maternal Uncle   . Esophageal cancer Paternal Uncle   . Rectal cancer Neg Hx   . Stomach cancer Neg Hx     Allergies  Allergen Reactions  . Flexeril [Cyclobenzaprine]     Unable to leave home when taking as she is incoherent/unsure where she is at times  . Hydrocodone     REACTION: GI upset    Current Outpatient Prescriptions on File Prior to Visit  Medication Sig Dispense Refill  . acetaminophen (TYLENOL) 650 MG CR tablet Take 650 mg by mouth every 8 (eight) hours as needed for pain.      Marland Kitchen aspirin 325 MG tablet Take 325 mg  by mouth daily.      Marland Kitchen azelastine (ASTELIN) 0.1 % nasal spray 1 to 2 sprays each nostril at bedtime  90 mL  3  . Azelastine-Fluticasone (DYMISTA) 137-50 MCG/ACT SUSP Place 2 sprays into both nostrils at bedtime.  3 Bottle  0  . Carbamazepine (EQUETRO) 300 MG CP12 Take 600 mg by mouth daily.      . cetirizine (ZYRTEC) 10 MG tablet Take 10 mg by mouth daily.      . cyclobenzaprine (FLEXERIL) 10 MG tablet Take 10 mg by mouth 3 (three) times daily as needed for muscle spasms.      . fluticasone (FLONASE) 50 MCG/ACT nasal spray 1-2 sprays at bedtime  29.7 g  3  . ibuprofen (ADVIL,MOTRIN) 200 MG tablet Take 200 mg by mouth every 6 (six) hours as needed.      . lamoTRIgine (LAMICTAL) 150 MG tablet Take 300 mg by mouth daily.       . methocarbamol (ROBAXIN) 500 MG tablet Take 500 mg by mouth every 8 (eight) hours as needed for muscle spasms.      . Multiple Vitamin (MULTIVITAMIN WITH  MINERALS) TABS Take 1 tablet by mouth daily.      Marland Kitchen omeprazole (PRILOSEC) 40 MG capsule Take 1 capsule (40 mg total) by mouth daily.  90 capsule  1  . OVER THE COUNTER MEDICATION Take 1 capsule by mouth 2 (two) times daily. hydroxycut max for women      . sertraline (ZOLOFT) 50 MG tablet Take 50 mg by mouth daily.       Current Facility-Administered Medications on File Prior to Visit  Medication Dose Route Frequency Provider Last Rate Last Dose  . methylPREDNISolone acetate (DEPO-MEDROL) injection 80 mg  80 mg Intra-articular Once Timoteo Gaul, FNP        BP 96/62  Temp(Src) 98.4 F (36.9 C) (Oral)  Wt 195 lb (88.451 kg)     Objective:   Physical Exam  Constitutional: She is oriented to person, place, and time. She appears well-developed and well-nourished.  She is appears tired.   HENT:  Head: Normocephalic.  Eyes: EOM are normal. Pupils are equal, round, and reactive to light. No scleral icterus.  Neck: Normal range of motion.  Cardiovascular: Normal rate, regular rhythm and normal heart sounds.   Pulmonary/Chest: Effort normal.  Abdominal: Soft.  Musculoskeletal: Normal range of motion. She exhibits no edema.  Neurological: She is alert and oriented to person, place, and time.  Skin: Skin is warm and dry.  Psychiatric:  She is labile; smiling, then tearful and frustrated.      Assessment & Plan:  Brittany [name] was seen today for discuss medication.  Diagnoses and associated orders for this visit:  Other malaise and fatigue - TSH

## 2013-12-25 ENCOUNTER — Encounter: Payer: Self-pay | Admitting: Internal Medicine

## 2014-01-13 ENCOUNTER — Emergency Department (HOSPITAL_COMMUNITY): Payer: Medicare Other

## 2014-01-13 ENCOUNTER — Encounter (HOSPITAL_COMMUNITY): Payer: Self-pay | Admitting: Emergency Medicine

## 2014-01-13 ENCOUNTER — Emergency Department (HOSPITAL_COMMUNITY)
Admission: EM | Admit: 2014-01-13 | Discharge: 2014-01-13 | Disposition: A | Discharge status: Home / Self Care | Payer: Medicare Other | Attending: Emergency Medicine | Admitting: Emergency Medicine

## 2014-01-13 ENCOUNTER — Telehealth: Payer: Self-pay | Admitting: Family

## 2014-01-13 DIAGNOSIS — Z8709 Personal history of other diseases of the respiratory system: Secondary | ICD-10-CM | POA: Insufficient documentation

## 2014-01-13 DIAGNOSIS — IMO0002 Reserved for concepts with insufficient information to code with codable children: Secondary | ICD-10-CM | POA: Insufficient documentation

## 2014-01-13 DIAGNOSIS — Z87891 Personal history of nicotine dependence: Secondary | ICD-10-CM | POA: Diagnosis not present

## 2014-01-13 DIAGNOSIS — Z8639 Personal history of other endocrine, nutritional and metabolic disease: Secondary | ICD-10-CM | POA: Diagnosis not present

## 2014-01-13 DIAGNOSIS — Z862 Personal history of diseases of the blood and blood-forming organs and certain disorders involving the immune mechanism: Secondary | ICD-10-CM | POA: Insufficient documentation

## 2014-01-13 DIAGNOSIS — Z90710 Acquired absence of both cervix and uterus: Secondary | ICD-10-CM | POA: Diagnosis not present

## 2014-01-13 DIAGNOSIS — R109 Unspecified abdominal pain: Secondary | ICD-10-CM | POA: Insufficient documentation

## 2014-01-13 DIAGNOSIS — Z8679 Personal history of other diseases of the circulatory system: Secondary | ICD-10-CM | POA: Diagnosis not present

## 2014-01-13 DIAGNOSIS — F3189 Other bipolar disorder: Secondary | ICD-10-CM | POA: Insufficient documentation

## 2014-01-13 DIAGNOSIS — J9819 Other pulmonary collapse: Secondary | ICD-10-CM | POA: Diagnosis not present

## 2014-01-13 DIAGNOSIS — K219 Gastro-esophageal reflux disease without esophagitis: Secondary | ICD-10-CM | POA: Insufficient documentation

## 2014-01-13 DIAGNOSIS — Z7982 Long term (current) use of aspirin: Secondary | ICD-10-CM | POA: Diagnosis not present

## 2014-01-13 DIAGNOSIS — F411 Generalized anxiety disorder: Secondary | ICD-10-CM | POA: Insufficient documentation

## 2014-01-13 DIAGNOSIS — K828 Other specified diseases of gallbladder: Secondary | ICD-10-CM

## 2014-01-13 DIAGNOSIS — Z872 Personal history of diseases of the skin and subcutaneous tissue: Secondary | ICD-10-CM | POA: Diagnosis not present

## 2014-01-13 DIAGNOSIS — K5900 Constipation, unspecified: Secondary | ICD-10-CM | POA: Diagnosis not present

## 2014-01-13 DIAGNOSIS — Z79899 Other long term (current) drug therapy: Secondary | ICD-10-CM | POA: Diagnosis not present

## 2014-01-13 DIAGNOSIS — M129 Arthropathy, unspecified: Secondary | ICD-10-CM | POA: Insufficient documentation

## 2014-01-13 DIAGNOSIS — R079 Chest pain, unspecified: Secondary | ICD-10-CM | POA: Insufficient documentation

## 2014-01-13 DIAGNOSIS — N281 Cyst of kidney, acquired: Secondary | ICD-10-CM | POA: Diagnosis not present

## 2014-01-13 DIAGNOSIS — R1010 Upper abdominal pain, unspecified: Secondary | ICD-10-CM

## 2014-01-13 LAB — CBC
HEMATOCRIT: 38.1 % (ref 36.0–46.0)
HEMOGLOBIN: 12.7 g/dL (ref 12.0–15.0)
MCH: 29.7 pg (ref 26.0–34.0)
MCHC: 33.3 g/dL (ref 30.0–36.0)
MCV: 89.2 fL (ref 78.0–100.0)
Platelets: 345 10*3/uL (ref 150–400)
RBC: 4.27 MIL/uL (ref 3.87–5.11)
RDW: 13.2 % (ref 11.5–15.5)
WBC: 7 10*3/uL (ref 4.0–10.5)

## 2014-01-13 LAB — BASIC METABOLIC PANEL
Anion gap: 12 (ref 5–15)
BUN: 12 mg/dL (ref 6–23)
CO2: 25 mEq/L (ref 19–32)
Calcium: 10.7 mg/dL — ABNORMAL HIGH (ref 8.4–10.5)
Chloride: 106 mEq/L (ref 96–112)
Creatinine, Ser: 0.59 mg/dL (ref 0.50–1.10)
GFR calc Af Amer: >90 mL/min (ref >90)
GFR calc non Af Amer: >90 mL/min (ref >90)
GLUCOSE: 121 mg/dL — AB (ref 70–99)
POTASSIUM: 3.7 meq/L (ref 3.7–5.3)
Sodium: 143 mEq/L (ref 137–147)

## 2014-01-13 LAB — HEPATIC FUNCTION PANEL
ALT: 72 U/L — ABNORMAL HIGH (ref 0–35)
AST: 69 U/L — AB (ref 0–37)
Albumin: 3.7 g/dL (ref 3.5–5.2)
Alkaline Phosphatase: 136 U/L — ABNORMAL HIGH (ref 39–117)
Bilirubin, Direct: <0.2 mg/dL (ref 0.0–0.3)
Total Protein: 6.9 g/dL (ref 6.0–8.3)

## 2014-01-13 LAB — LIPASE, BLOOD: Lipase: 69 U/L — ABNORMAL HIGH (ref 11–59)

## 2014-01-13 MED ORDER — ONDANSETRON HCL 4 MG/2ML IJ SOLN
4.0000 mg | Freq: Once | INTRAMUSCULAR | Status: AC
  Administered 2014-01-13: 4 mg via INTRAVENOUS
  Filled 2014-01-13: qty 2

## 2014-01-13 MED ORDER — OXYCODONE HCL 5 MG PO TABS: 5.0000 mg | ORAL_TABLET | ORAL | Status: DC | PRN

## 2014-01-13 MED ORDER — GI COCKTAIL ~~LOC~~
30.0000 mL | Freq: Once | ORAL | Status: AC
  Administered 2014-01-13: 30 mL via ORAL
  Filled 2014-01-13: qty 30

## 2014-01-13 MED ORDER — MORPHINE SULFATE 4 MG/ML IJ SOLN
4.0000 mg | Freq: Once | INTRAMUSCULAR | Status: AC
  Administered 2014-01-13: 4 mg via INTRAVENOUS
  Filled 2014-01-13: qty 1

## 2014-01-13 MED ORDER — ONDANSETRON 4 MG PO TBDP: ORAL_TABLET | ORAL | Status: DC

## 2014-01-13 NOTE — Telephone Encounter (Signed)
Patient Information:  Caller Name: C[place]  Phone: [phone]  Patient: Brittany [name]  Gender: Female  DOB: Jan 21, 1956  Age: 58 Years  PCP: Other  Office Follow Up:  Does the office need to follow up with this patient?: No  Instructions For The Office: N/A  RN Note:  Patient is calling in to the [facility] office states that she is a patient of Dr. Megan Salon however she is calling the [facility] office because she would like to start going here due to closer location.  Triaged for Abdominal Pain 3/10 that started yesterday after patient had Shrimp for breakfast, Romen noodles for lunch and Omelet with Ham and onions for Dinner.  Pain was not present this morning however started back around 10:30 after eating a sloppy joe made with deer meat and almonds around 09:30. Patient states that pain usually occurs after eating. and she also has not taken her Omeprazole for the last two weeks. Took first dose again this morning. Patient will continue to take her Omeprazole and take in clear fluids and bland diet as instructed and if no improvement will follow up for evaluation.  Symptoms  Reason For Call & Symptoms: Abdominal pain  Reviewed Health History In EMR: Yes  Reviewed Medications In EMR: Yes  Reviewed Allergies In EMR: Yes  Reviewed Surgeries / Procedures: Yes  Date of Onset of Symptoms: 01/12/2014  Guideline(s) Used:  Abdominal Pain - Female  Disposition Per Guideline:   Home Care  Reason For Disposition Reached:   Mild abdominal pain  Advice Given:  Reassurance:  A mild stomachache can be caused by indigestion, gas pains or overeating. Sometimes a stomachache signals the onset of a vomiting illness due to a viral gastroenteritis ("stomach flu").  Fluids:  Sip clear fluids only (e.g., water, flat soft drinks or 1/2 strength fruit juice) until the pain has been gone for over 2 hours. Then slowly return to a regular diet.  Diet:  Slowly advance diet from clear liquids to a bland  diet  Avoid alcohol or caffeinated beverages  Avoid greasy or fatty foods.  Call Back If:  Abdominal pain is constant and present for more than 2 hours  Abdominal pains come and go and are present for more than 24 hours  You become worse.

## 2014-01-13 NOTE — ED Provider Notes (Signed)
CSN: [id number]     Arrival date & time 01/13/14  1433 History   First MD Initiated Contact with Patient 01/13/14 [phone]     Chief Complaint  Patient presents with  . Abdominal Pain    x 1 day  . Nausea    acid reflux x 1 day  . Chest Pain    midsternal pressure     (Consider location/radiation/quality/duration/timing/severity/associated sxs/prior Treatment) HPI 89 six-year-old female presents with intermittent cramping abdominal pain over last 30 hours. She states is in her right and left flank. Did not notice it comes and goes. She did try crackers and a biscuit and her pain seems resolved. Denies any diarrhea. Has not had a bowel movement during this pain. No fevers. She also started developing chest pressure that she feels is coming from her epigastrium. No worsening with walking. No shortness of breath. Has felt nauseous during this time. No diaphoresis. No vomiting. The pain in her chest is currently a 3/10 and the pain in her abdomen is about a 2/10.  Past Medical History  Diagnosis Date  . HYPERLIPIDEMIA   . HEMORRHOIDS-INTERNAL   . URI   . OBSTRUCTIVE SLEEP APNEA   . DISORDER, BIPOLAR NEC   . DEPRESSION   . ALOPECIA   . ALLERGIC RHINITIS   . ACNE NEC   . Anal fissure   . Anxiety   . Arthritis   . Chronic headaches   . Depression   . Bowel obstruction   . GERD (gastroesophageal reflux disease)    Past Surgical History  Procedure Laterality Date  . Knee surgery      bilateral  . Breast lumpectomy    . Radical hysterectomy    . Tonsillectomy    . Wrist surgery     Family History  Problem Relation Age of Onset  . Breast cancer    . Lung cancer    . Colon cancer Maternal Uncle   . Colon polyps Maternal Uncle   . Other Paternal Aunt     x 2, blood clotting disorder  . Heart disease Sister   . Irritable bowel syndrome Cousin   . Kidney disease Maternal Uncle   . Esophageal cancer Paternal Uncle   . Rectal cancer Neg Hx   . Stomach cancer Neg Hx    History   Substance Use Topics  . Smoking status: Former Smoker -- 0.20 packs/day for 7 years    Types: Cigarettes    Quit date: 06/20/1987  . Smokeless tobacco: Never Used  . Alcohol Use: No   OB History   Grav Para Term Preterm Abortions TAB SAB Ect Mult Living                 Review of Systems  Constitutional: Negative for fever.  Respiratory: Negative for shortness of breath.   Cardiovascular: Positive for chest pain.  Gastrointestinal: Positive for nausea and abdominal pain. Negative for vomiting and diarrhea.  Genitourinary: Negative for dysuria.  Musculoskeletal: Negative for back pain.  All other systems reviewed and are negative.     Allergies  Flexeril and Hydrocodone  Home Medications   Prior to Admission medications   Medication Sig Start Date End Date Taking? Authorizing Provider  aspirin 325 MG tablet Take 325 mg by mouth daily.   Yes Historical Provider, MD  azelastine (ASTELIN) 0.1 % nasal spray 1 to 2 sprays each nostril at bedtime 10/23/13  Yes Deneise Lever, MD  Carbamazepine (EQUETRO) 300 MG CP12 Take 600 mg  by mouth daily.   Yes Historical Provider, MD  cetirizine (ZYRTEC) 10 MG tablet Take 10 mg by mouth daily.   Yes Historical Provider, MD  fluticasone (FLONASE) 50 MCG/ACT nasal spray 1-2 sprays at bedtime 10/23/13  Yes Deneise Lever, MD  lamoTRIgine (LAMICTAL) 150 MG tablet Take 300 mg by mouth daily.    Yes Historical Provider, MD  methocarbamol (ROBAXIN) 500 MG tablet Take 500 mg by mouth every 8 (eight) hours as needed for muscle spasms.   Yes Historical Provider, MD  Multiple Vitamin (MULTIVITAMIN WITH MINERALS) TABS Take 1 tablet by mouth daily.   Yes Historical Provider, MD  omeprazole (PRILOSEC) 40 MG capsule Take 1 capsule (40 mg total) by mouth daily. 11/18/13  Yes Timoteo Gaul, FNP  sertraline (ZOLOFT) 50 MG tablet Take 50 mg by mouth daily.   Yes Historical Provider, MD   BP 113/74  Pulse 72  Temp(Src) 98 F (36.7 C) (Oral)  Resp 16  SpO2  97% Physical Exam  Nursing note and vitals reviewed. Constitutional: She is oriented to person, place, and time. She appears well-developed and well-nourished. No distress.  HENT:  Head: Normocephalic and atraumatic.  Right Ear: External ear normal.  Left Ear: External ear normal.  Nose: Nose normal.  Eyes: Right eye exhibits no discharge. Left eye exhibits no discharge.  Cardiovascular: Normal rate, regular rhythm and normal heart sounds.   Pulmonary/Chest: Effort normal and breath sounds normal. She exhibits no tenderness.  Abdominal: Soft. She exhibits no distension. There is tenderness in the epigastric area.  Neurological: She is alert and oriented to person, place, and time.  Skin: Skin is warm and dry.    ED Course  Procedures (including critical care time) Labs Review Labs Reviewed  BASIC METABOLIC PANEL - Abnormal; Notable for the following:    Glucose, Bld 121 (*)    Calcium 10.7 (*)    All other components within normal limits  HEPATIC FUNCTION PANEL - Abnormal; Notable for the following:    AST 69 (*)    ALT 72 (*)    Alkaline Phosphatase 136 (*)    Total Bilirubin <0.2 (*)    All other components within normal limits  LIPASE, BLOOD - Abnormal; Notable for the following:    Lipase 69 (*)    All other components within normal limits  CBC  I-STAT TROPOININ, ED    Imaging Review US Abdomen Complete  01/13/2014   CLINICAL DATA:  Epigastric pain  EXAM: ULTRASOUND ABDOMEN COMPLETE  COMPARISON:  Prior MRI from 11/20/2013  FINDINGS: Gallbladder:  No gallbladder wall thickening identified. Small amount of layering sludge with possible small stones present within the gallbladder lumen. No sonographic Murphy sign noted.  Common bile duct:  Diameter: 7 mm.  No intrahepatic biliary dilatation.  Liver:  No focal lesion identified. Within normal limits in parenchymal echogenicity.  IVC:  No abnormality visualized.  Pancreas:  Visualized portion unremarkable.  Spleen:  Size and  appearance within normal limits.  Right Kidney:  Length: 10.5 cm. Echogenicity within normal limits. No mass or hydronephrosis visualized. Simple cysts measuring up to 2.5 cm present. Probable parapelvic cyst present, better seen on prior MRI.  Left Kidney:  Length: 10.2 cm. Echogenicity within normal limits. No mass or hydronephrosis visualized.  Abdominal aorta:  No aneurysm visualized.  Other findings:  None.  IMPRESSION: 1. Gallbladder sludge with possible small layering stones. No sonographic evidence of acute cholecystitis. 2. Mild prominence of the common bile duct measuring up to  7 mm. 3. Simple right renal cysts measuring up to 2.5 cm.   Electronically Signed   By: Jeannine Boga M.D.   On: 01/13/2014 19:07   Dg Abd Acute W/chest  01/13/2014   CLINICAL DATA:  Pain.  EXAM: ACUTE ABDOMEN SERIES (ABDOMEN 2 VIEW & CHEST 1 VIEW)  COMPARISON:  MRI abdomen 11/20/2013.  FINDINGS: Chest x-ray reveals mild cardiomegaly and subsegmental atelectasis in the lung bases.  Soft tissues of the abdomen unremarkable. Stool in the colon. No bowel distention or free air. No acute bony abnormality.  IMPRESSION: 1. Chest x-ray reveals mild cardiomegaly and subsegmental atelectasis in the lung bases. 2. Stool noted throughout the colon.  No bowel distention.   Electronically Signed   By: Marcello Moores  Register   On: 01/13/2014 17:51     EKG Interpretation   Date/Time:  Tuesday January 13 2014 15:07:05 EDT Ventricular Rate:  69 PR Interval:  179 QRS Duration: 87 QT Interval:  405 QTC Calculation: 434 R Axis:   75 Text Interpretation:  Sinus rhythm Anteroseptal infarct, age indeterminate  No significant change since last tracing Confirmed by Drexel Ivey  MD, Mersadie Kavanaugh  ([id number]) on 01/13/2014 5:26:21 PM      MDM   Final diagnoses:  Pain of upper abdomen  Gallbladder sludge    Discussed patient's case with GI on call, Dr. Cristina Gong, who feels she likely had a passed GB stone. Her pain has improved and her vitals are  stable. No cholecystitis. Recommends treating pain, giving return precautions, and will need EUS as outpatient. He will help set up close GI f/u. Troponin was sent in triage, though her CP sounds like it is coming from GI origin. I have low suspicion this is ACS given her abdominal complaints and findings.     Ephraim Hamburger, MD 01/14/14 [phone]

## 2014-01-13 NOTE — ED Notes (Signed)
Patient transported to X-ray 

## 2014-01-13 NOTE — Discharge Instructions (Signed)
Your abdominal pain was likely caused by a passed gallstone. If your symptoms worsen or recur you need to return to the ER. Otherwise you need to follow closely with the gastroenterologist provided. You likely need an outpatient endoscopic ultrasound.   Abdominal Pain Many things can cause abdominal pain. Usually, abdominal pain is not caused by a disease and will improve without treatment. It can often be observed and treated at home. Your health care provider will do a physical exam and possibly order blood tests and X-rays to help determine the seriousness of your pain. However, in many cases, more time must pass before a clear cause of the pain can be found. Before that point, your health care provider may not know if you need more testing or further treatment. HOME CARE INSTRUCTIONS  Monitor your abdominal pain for any changes. The following actions may help to alleviate any discomfort you are experiencing:  Only take over-the-counter or prescription medicines as directed by your health care provider.  Do not take laxatives unless directed to do so by your health care provider.  Try a clear liquid diet (broth, tea, or water) as directed by your health care provider. Slowly move to a bland diet as tolerated. SEEK MEDICAL CARE IF:  You have unexplained abdominal pain.  You have abdominal pain associated with nausea or diarrhea.  You have pain when you urinate or have a bowel movement.  You experience abdominal pain that wakes you in the night.  You have abdominal pain that is worsened or improved by eating food.  You have abdominal pain that is worsened with eating fatty foods.  You have a fever. SEEK IMMEDIATE MEDICAL CARE IF:   Your pain does not go away within 2 hours.  You keep throwing up (vomiting).  Your pain is felt only in portions of the abdomen, such as the right side or the left lower portion of the abdomen.  You pass bloody or black tarry stools. MAKE SURE  YOU:  Understand these instructions.   Will watch your condition.   Will get help right away if you are not doing well or get worse.  Document Released: 03/15/2005 Document Revised: 06/10/2013 Document Reviewed: 02/12/2013 [facility] Patient Information 2015 [place], [place]. This information is not intended to replace advice given to you by your health care provider. Make sure you discuss any questions you have with your health care provider. Cholelithiasis Cholelithiasis (also called gallstones) is a form of gallbladder disease in which gallstones form in your gallbladder. The gallbladder is an organ that stores bile made in the liver, which helps digest fats. Gallstones begin as small crystals and slowly grow into stones. Gallstone pain occurs when the gallbladder spasms and a gallstone is blocking the duct. Pain can also occur when a stone passes out of the duct.  RISK FACTORS  Being female.   Having multiple pregnancies. Health care providers sometimes advise removing diseased gallbladders before future pregnancies.   Being obese.  Eating a diet heavy in fried foods and fat.   Being older than 85 years and increasing age.   Prolonged use of medicines containing female hormones.   Having diabetes mellitus.   Rapidly losing weight.   Having a family history of gallstones (heredity).  SYMPTOMS  Nausea.   Vomiting.  Abdominal pain.   Yellowing of the skin (jaundice).   Sudden pain. It may persist from several minutes to several hours.  Fever.   Tenderness to the touch. In some cases, when gallstones do  not move into the bile duct, people have no pain or symptoms. These are called "silent" gallstones.  TREATMENT Silent gallstones do not need treatment. In severe cases, emergency surgery may be required. Options for treatment include:  Surgery to remove the gallbladder. This is the most common treatment.  Medicines. These do not always work and may take  6-12 months or more to work.  Shock wave treatment (extracorporeal biliary lithotripsy). In this treatment an ultrasound machine sends shock waves to the gallbladder to break gallstones into smaller pieces that can pass into the intestines or be dissolved by medicine. HOME CARE INSTRUCTIONS   Only take over-the-counter or prescription medicines for pain, discomfort, or fever as directed by your health care provider.   Follow a low-fat diet until seen again by your health care provider. Fat causes the gallbladder to contract, which can result in pain.   Follow up with your health care provider as directed. Attacks are almost always recurrent and surgery is usually required for permanent treatment.  SEEK IMMEDIATE MEDICAL CARE IF:   Your pain increases and is not controlled by medicines.   You have a fever or persistent symptoms for more than 2-3 days.   You have a fever and your symptoms suddenly get worse.   You have persistent nausea and vomiting.  MAKE SURE YOU:   Understand these instructions.  Will watch your condition.  Will get help right away if you are not doing well or get worse. Document Released: 06/01/2005 Document Revised: 02/05/2013 Document Reviewed: 11/27/2012 [facility] Patient Information 2015 [place], [place]. This information is not intended to replace advice given to you by your health care provider. Make sure you discuss any questions you have with your health care provider.

## 2014-01-13 NOTE — ED Notes (Signed)
Pt reports intermittent nausea and abdominal pain x 24 hrs. Pt has been eating large qualities of fried and fatty foods over last 3 days. Pt recently stopped taking her Zantac. Pt reports 90 minute hx of midsternal chest pressure, nonradiating. Pt is alert , oriented and appropriate. Drove self to ED

## 2014-01-15 DIAGNOSIS — R1013 Epigastric pain: Secondary | ICD-10-CM | POA: Diagnosis not present

## 2014-01-15 DIAGNOSIS — K8020 Calculus of gallbladder without cholecystitis without obstruction: Secondary | ICD-10-CM | POA: Diagnosis not present

## 2014-01-15 DIAGNOSIS — R932 Abnormal findings on diagnostic imaging of liver and biliary tract: Secondary | ICD-10-CM | POA: Diagnosis not present

## 2014-01-15 DIAGNOSIS — R7402 Elevation of levels of lactic acid dehydrogenase (LDH): Secondary | ICD-10-CM | POA: Diagnosis not present

## 2014-01-15 DIAGNOSIS — R7401 Elevation of levels of liver transaminase levels: Secondary | ICD-10-CM | POA: Diagnosis not present

## 2014-01-19 ENCOUNTER — Encounter (HOSPITAL_COMMUNITY): Payer: Self-pay | Admitting: Pharmacy Technician

## 2014-01-19 ENCOUNTER — Encounter (HOSPITAL_COMMUNITY): Payer: Self-pay | Admitting: *Deleted

## 2014-01-20 DIAGNOSIS — Z01419 Encounter for gynecological examination (general) (routine) without abnormal findings: Secondary | ICD-10-CM | POA: Diagnosis not present

## 2014-01-20 DIAGNOSIS — Z9189 Other specified personal risk factors, not elsewhere classified: Secondary | ICD-10-CM | POA: Diagnosis not present

## 2014-01-22 DIAGNOSIS — R932 Abnormal findings on diagnostic imaging of liver and biliary tract: Secondary | ICD-10-CM | POA: Diagnosis not present

## 2014-01-22 DIAGNOSIS — R7401 Elevation of levels of liver transaminase levels: Secondary | ICD-10-CM | POA: Diagnosis not present

## 2014-01-22 DIAGNOSIS — K8020 Calculus of gallbladder without cholecystitis without obstruction: Secondary | ICD-10-CM | POA: Diagnosis not present

## 2014-01-22 DIAGNOSIS — R7402 Elevation of levels of lactic acid dehydrogenase (LDH): Secondary | ICD-10-CM | POA: Diagnosis not present

## 2014-01-22 DIAGNOSIS — R1013 Epigastric pain: Secondary | ICD-10-CM | POA: Diagnosis not present

## 2014-01-26 ENCOUNTER — Ambulatory Visit (INDEPENDENT_AMBULATORY_CARE_PROVIDER_SITE_OTHER): Payer: Medicare Other | Admitting: Internal Medicine

## 2014-01-26 ENCOUNTER — Other Ambulatory Visit: Payer: Self-pay | Admitting: Gastroenterology

## 2014-01-26 ENCOUNTER — Encounter: Payer: Self-pay | Admitting: Internal Medicine

## 2014-01-26 VITALS — BP 114/76 | HR 77 | Ht 62.0 in | Wt 189.0 lb

## 2014-01-26 DIAGNOSIS — G4733 Obstructive sleep apnea (adult) (pediatric): Secondary | ICD-10-CM | POA: Diagnosis not present

## 2014-01-26 NOTE — Patient Instructions (Signed)
Order- [facility]erral to [facility] daytime for CPAP mask comfort fitting     Dx OSA             We can continue CPAP 11/ APS

## 2014-01-26 NOTE — Progress Notes (Signed)
08/19/12- 71 yoF former smoker seeking to re-establish for hx OSA complicated by BiPolar/depression. Former patient-last seen 2010. Was using CAP 8/ SMS with good compliance and control. She lost weight, stopped snoring and dropped off CPAP then. Says she couldn't deal with water gurgling in the hose. Since then has regained weight, snoring loudly so husband leaves bedroom. Daytime sleepiness if sitting quietly. New problem- perennial rhinitis, intense around newly mowed grass and house dust. Never tested. Notes wheezing when she lies down. No hx ENT surgery or asthma. Smoked 1 ppw x 7 years. Family hx of COPD, food allergies. Bedtime 12MN-3AM, latency 2-3 hours, waking 8-10 x/ night before up 7-9 AM.  NPSG 08/25/08- AHI 10.8/ hr, weighed 190 lbs.  09/19/12- 58 yoF former smoker seeking to re-establish for hx OSA complicated by BiPolar/depression FOLLOWS FOR: review sleep study with patient. Has difficulty sleeping away from her familiar bedroom .NPSG 09/04/12- AHI 10.1 per hour, loud snoring, weight 190 pounds. Mild obstructive sleep apnea.  10/02/13- 41 yoF former smoker followed for OSA complicated by BiPolar/depression FOLLOWS FOR: review sleep study with patient. CPAP 11/ APS.compliance and control has been good. Can't sleep without it. Perennial nasal stuffiness. Occasional wheeze when she laughs  01/26/14- 57 yoF former smoker followed for OSA complicated by BiPolar/depression FOLLOWS FOR: wears cpap 11/ APS at least 5 hours nightly, also wears it when napping.  States her mask is making her nose bigger, but has no complaints with mask/supplies/pressure.  Got new CPAP machine after last office visit. She thinks the nasal pillows mask is dilating her nostrils. We talked about getting it refitted by her home care company. She sleeps well and says she cannot sleep without CPAP now.  ROS-see HPI Constitutional:   No-   weight loss, night sweats, fevers, chills, fatigue, lassitude. HEENT:   No-   headaches, difficulty swallowing, tooth/dental problems, sore throat,       No- sneezing, itching, ear ache, +nasal congestion, +post nasal drip,  CV:  No-   chest pain, orthopnea, PND, swelling in lower extremities, anasarca,                                  dizziness, palpitations Resp: No-   shortness of breath with exertion or at rest.              No-   productive cough,  No non-productive cough,  No- coughing up of blood.              No-   change in color of mucus.   wheezing.   Skin: No-   rash or lesions. GI:  No-   heartburn, indigestion, abdominal pain, nausea, vomiting, GU: . MS:  + joint pain or swelling.  Neuro-     nothing unusual Psych:  No- change in mood or affect. + depression or anxiety.  No memory loss.  OBJ- Physical Exam General- Alert, Oriented, Affect-appropriate, Distress- none acute, overweight Skin- rash-none, lesions- none, excoriation- none Lymphadenopathy- none Head- atraumatic            Eyes- Gross vision intact, PERRLA, conjunctivae and secretions clear            Ears- Hearing, canals-normal            Nose-  +pale turbinate ededma, no-Septal dev, mucus, polyps, erosion, perforation             Throat- Mallampati III , mucosa  clear , drainage- none, tonsils- atrophic Neck- flexible , trachea midline, no stridor , thyroid nl, carotid no bruit Chest - symmetrical excursion , unlabored           Heart/CV- RRR , no murmur , no gallop  , no rub, nl s1 s2                           - JVD- none , edema- none, stasis changes- none, varices- none           Lung- clear to P&A, wheeze- none, cough- none , dullness-none, rub- none           Chest wall-  Abd-  Br/ Gen/ Rectal- Not done, not indicated Extrem- cyanosis- none, clubbing, none, atrophy- none, strength- nl. Cane Neuro- grossly intact to observation

## 2014-01-27 ENCOUNTER — Ambulatory Visit (HOSPITAL_COMMUNITY)
Admission: RE | Admit: 2014-01-27 | Discharge: 2014-01-27 | Disposition: A | Discharge status: Home / Self Care | Payer: Medicare Other | Source: Ambulatory Visit | Attending: Gastroenterology | Admitting: Gastroenterology

## 2014-01-27 ENCOUNTER — Encounter (HOSPITAL_COMMUNITY)
Admission: RE | Disposition: A | Discharge status: Home / Self Care | Payer: Self-pay | Source: Ambulatory Visit | Attending: Gastroenterology

## 2014-01-27 ENCOUNTER — Encounter (HOSPITAL_COMMUNITY): Payer: Medicare Other | Admitting: Anesthesiology

## 2014-01-27 ENCOUNTER — Encounter (HOSPITAL_COMMUNITY): Payer: Self-pay | Admitting: *Deleted

## 2014-01-27 ENCOUNTER — Ambulatory Visit (HOSPITAL_COMMUNITY): Payer: Medicare Other | Admitting: Anesthesiology

## 2014-01-27 DIAGNOSIS — R7402 Elevation of levels of lactic acid dehydrogenase (LDH): Secondary | ICD-10-CM | POA: Diagnosis not present

## 2014-01-27 DIAGNOSIS — G4733 Obstructive sleep apnea (adult) (pediatric): Secondary | ICD-10-CM | POA: Diagnosis not present

## 2014-01-27 DIAGNOSIS — K2100 Gastro-esophageal reflux disease with esophagitis, without bleeding: Secondary | ICD-10-CM | POA: Diagnosis not present

## 2014-01-27 DIAGNOSIS — K8020 Calculus of gallbladder without cholecystitis without obstruction: Secondary | ICD-10-CM | POA: Diagnosis not present

## 2014-01-27 DIAGNOSIS — Z885 Allergy status to narcotic agent status: Secondary | ICD-10-CM | POA: Insufficient documentation

## 2014-01-27 DIAGNOSIS — E785 Hyperlipidemia, unspecified: Secondary | ICD-10-CM | POA: Diagnosis not present

## 2014-01-27 DIAGNOSIS — L659 Nonscarring hair loss, unspecified: Secondary | ICD-10-CM | POA: Diagnosis not present

## 2014-01-27 DIAGNOSIS — Z87891 Personal history of nicotine dependence: Secondary | ICD-10-CM | POA: Diagnosis not present

## 2014-01-27 DIAGNOSIS — M1990 Unspecified osteoarthritis, unspecified site: Secondary | ICD-10-CM | POA: Diagnosis not present

## 2014-01-27 DIAGNOSIS — Z90710 Acquired absence of both cervix and uterus: Secondary | ICD-10-CM | POA: Diagnosis not present

## 2014-01-27 DIAGNOSIS — K219 Gastro-esophageal reflux disease without esophagitis: Secondary | ICD-10-CM | POA: Diagnosis not present

## 2014-01-27 DIAGNOSIS — R51 Headache: Secondary | ICD-10-CM | POA: Diagnosis not present

## 2014-01-27 DIAGNOSIS — R109 Unspecified abdominal pain: Secondary | ICD-10-CM | POA: Diagnosis not present

## 2014-01-27 DIAGNOSIS — R932 Abnormal findings on diagnostic imaging of liver and biliary tract: Secondary | ICD-10-CM | POA: Diagnosis not present

## 2014-01-27 DIAGNOSIS — R7401 Elevation of levels of liver transaminase levels: Secondary | ICD-10-CM | POA: Diagnosis not present

## 2014-01-27 DIAGNOSIS — R7989 Other specified abnormal findings of blood chemistry: Secondary | ICD-10-CM | POA: Diagnosis present

## 2014-01-27 HISTORY — PX: EUS: SHX5427

## 2014-01-27 SURGERY — ESOPHAGEAL ENDOSCOPIC ULTRASOUND (EUS) RADIAL
Anesthesia: Monitor Anesthesia Care

## 2014-01-27 MED ORDER — PROPOFOL 10 MG/ML IV BOLUS
INTRAVENOUS | Status: DC | PRN
  Administered 2014-01-27: 50 mg via INTRAVENOUS
  Administered 2014-01-27: 100 mg via INTRAVENOUS
  Administered 2014-01-27: 50 mg via INTRAVENOUS

## 2014-01-27 MED ORDER — PROPOFOL INFUSION 10 MG/ML OPTIME
INTRAVENOUS | Status: DC | PRN
  Administered 2014-01-27: 100 ug/kg/min via INTRAVENOUS

## 2014-01-27 MED ORDER — SODIUM CHLORIDE 0.9 % IV SOLN
INTRAVENOUS | Status: DC
Start: 2014-01-27 — End: 2014-01-27

## 2014-01-27 MED ORDER — PROPOFOL 10 MG/ML IV BOLUS
INTRAVENOUS | Status: AC
  Filled 2014-01-27: qty 20

## 2014-01-27 MED ORDER — LACTATED RINGERS IV SOLN
INTRAVENOUS | Status: DC
  Administered 2014-01-27: 13:00:00 via INTRAVENOUS

## 2014-01-27 NOTE — Discharge Instructions (Signed)
Endoscopic Ultrasound ° °Care After °Please read the instructions outlined below and refer to this sheet in the next few weeks. These discharge instructions provide you with general information on caring for yourself after you leave the hospital. Your doctor may also give you specific instructions. While your treatment has been planned according to the most current medical practices available, unavoidable complications occasionally occur. If you have any problems or questions after discharge, please call Dr. Brielyn Bosak ([facility]) at [phone]. ° °HOME CARE INSTRUCTIONS °Activity °· You may resume your regular activity but move at a slower pace for the next 24 hours.  °· Take frequent rest periods for the next 24 hours.  °· Walking will help expel (get rid of) the air and reduce the bloated feeling in your abdomen.  °· No driving for 24 hours (because of the anesthesia (medicine) used during the test).  °· You may shower.  °· Do not sign any important legal documents or operate any machinery for 24 hours (because of the anesthesia used during the test).  °Nutrition °· Drink plenty of fluids.  °· You may resume your normal diet.  °· Begin with a light meal and progress to your normal diet.  °· Avoid alcoholic beverages for 24 hours or as instructed by your caregiver.  °Medications °You may resume your normal medications unless your caregiver tells you otherwise. °What you can expect today °· You may experience abdominal discomfort such as a feeling of fullness or "gas" pains.  °· You may experience a sore throat for 2 to 3 days. This is normal. Gargling with salt water may help this.  °·  °SEEK IMMEDIATE MEDICAL CARE IF: °· You have excessive nausea (feeling sick to your stomach) and/or vomiting.  °· You have severe abdominal pain and distention (swelling).  °· You have trouble swallowing.  °· You have a temperature over 100° F (37.8° C).  °· You have rectal bleeding or vomiting of blood.  °Document  Released: 01/18/2004 Document Revised: 02/15/2011 Document Reviewed: 07/31/2007 °Exit[employer]® Patient Information ©2012 [employer]. °

## 2014-01-27 NOTE — Op Note (Signed)
[facility] [place], [address]   ENDOSCOPIC ULTRASOUND PROCEDURE REPORT  PATIENT: Brittany, [name]  MR#: [id number] BIRTHDATE: 07-18-1955  GENDER: Female ENDOSCOPIST: Arta Silence, MD REFERRED BY:  Ronald Lobo, M.D.  Phoebe Sharps, M.D. PROCEDURE DATE:  01/27/2014 PROCEDURE:   Upper EUS ASA CLASS:      Class II INDICATIONS:   1.  upper abdominal pain, elevated LFTs. MEDICATIONS: MAC sedation, administered by CRNA  DESCRIPTION OF PROCEDURE:   After the risks benefits and alternatives of the procedure were  explained, informed consent was obtained. The patient was then placed in the left, lateral, decubitus postion and IV sedation was administered. Throughout the procedure, the patients blood pressure, pulse and oxygen saturations were monitored continuously.  Under direct visualization, the EUS scope [id number]  endoscope was introduced through the mouth  and advanced to the second portion of the duodenum .  Water was used as necessary to provide an acoustic interface.  Upon completion of the imaging, water was removed and the patient was sent to the recovery room in satisfactory condition.    FINDINGS:      Normal-appearing pancreas; no pancreatic mass, cyst, or features of chronic pancreatitis.  Several small gravity-dependent shadowing stones seen at apex of the gallbladder. No gallbladder wall thickening, pericholecystic fluid, or features of cholecystitis were identified.   Bile duct normal caliber without wall thickening, bile duct stones, mass.  Pancreatic duct normal.  IMPRESSION:     As above.  Gallstones without EUS evidence of cholecystitis.  Non-dilated bile duct without evidence of choledocholithiasis or other obstructive process.  RECOMMENDATIONS:     1.  Watch for potential complications of procedure. 2.  Agree with surgical consultation for consideration of cholecystectomy for biliary colic symptoms with gallstones. 3.   Will discuss case with Dr. Cristina Gong.   _______________________________ eSigned:  Arta Silence, MD 01/27/2014 2:58 PM   CC:

## 2014-01-27 NOTE — Addendum Note (Signed)
Addended by: Arta Silence on: 01/27/2014 12:14 PM   Modules accepted: Orders

## 2014-01-27 NOTE — Anesthesia Postprocedure Evaluation (Signed)
  Anesthesia Post-op Note  Patient: Brittany [name]  Procedure(s) Performed: Procedure(s): ESOPHAGEAL ENDOSCOPIC ULTRASOUND (EUS) RADIAL (N/A)  Patient Location: PACU  Anesthesia Type:MAC  Level of Consciousness: awake, alert  and oriented  Airway and Oxygen Therapy: Patient Spontanous Breathing  Post-op Pain: none  Post-op Assessment: Post-op Vital signs reviewed  Post-op Vital Signs: Reviewed  Last Vitals:  Filed Vitals:   01/27/14 1517  BP: 134/70  Pulse: 58  Temp:   Resp: 9    Complications: No apparent anesthesia complications

## 2014-01-27 NOTE — H&P (Signed)
Patient interval history reviewed.  Patient examined again.  There has been no change from documented H/P dated 01/22/2014 (scanned into chart from our office) except as documented above.  Assessment:  1.  Upper abdominal pain. 2.  Elevated LFTs. 3.  Gallstones.  Concern for bile duct stone(s).  Plan:  1.  Upper endoscopic ultrasound with possible fine needle aspiration biopsies. 2.  Risks (bleeding, infection, bowel perforation that could require surgery, sedation-related changes in cardiopulmonary systems), benefits (identification and possible treatment of source of symptoms, exclusion of certain causes of symptoms), and alternatives (watchful waiting, radiographic imaging studies, empiric medical treatment) of upper endoscopy with ultrasound and possible biopsies (EUS +/- FNA) were explained to patient/family in detail and patient wishes to proceed.

## 2014-01-27 NOTE — Anesthesia Preprocedure Evaluation (Addendum)
Anesthesia Evaluation  Patient identified by MRN, date of birth, ID band Patient awake    Reviewed: Allergy & Precautions, H&P , NPO status , Patient's Chart, lab work & pertinent test results  Airway Mallampati: II TM Distance: >3 FB Neck ROM: Full    Dental no notable dental hx.    Pulmonary sleep apnea and Continuous Positive Airway Pressure Ventilation , former smoker,  breath sounds clear to auscultation  Pulmonary exam normal       Cardiovascular negative cardio ROS  Rhythm:Regular Rate:Normal     Neuro/Psych negative neurological ROS  negative psych ROS   GI/Hepatic negative GI ROS, Neg liver ROS,   Endo/Other  negative endocrine ROS  Renal/GU negative Renal ROS  negative genitourinary   Musculoskeletal negative musculoskeletal ROS (+)   Abdominal   Peds negative pediatric ROS (+)  Hematology negative hematology ROS (+)   Anesthesia Other Findings   Reproductive/Obstetrics negative OB ROS                          Anesthesia Physical Anesthesia Plan  ASA: II  Anesthesia Plan: MAC   Post-op Pain Management:    Induction:   Airway Management Planned:   Additional Equipment:   Intra-op Plan:   Post-operative Plan:   Informed Consent: I have reviewed the patients History and Physical, chart, labs and discussed the procedure including the risks, benefits and alternatives for the proposed anesthesia with the patient or authorized representative who has indicated his/her understanding and acceptance.   Dental advisory given  Plan Discussed with: CRNA  Anesthesia Plan Comments:         Anesthesia Quick Evaluation

## 2014-01-27 NOTE — Transfer of Care (Signed)
Immediate Anesthesia Transfer of Care Note  Patient: Brittany [name]  Procedure(s) Performed: Procedure(s): ESOPHAGEAL ENDOSCOPIC ULTRASOUND (EUS) RADIAL (N/A)  Patient Location: PACU and Endoscopy Unit  Anesthesia Type:MAC  Level of Consciousness: awake, alert , oriented and patient cooperative  Airway & Oxygen Therapy: Patient Spontanous Breathing and Patient connected to nasal cannula oxygen  Post-op Assessment: Report given to PACU RN, Post -op Vital signs reviewed and stable and Patient moving all extremities  Post vital signs: Reviewed and stable  Complications: No apparent anesthesia complications

## 2014-01-28 ENCOUNTER — Encounter (HOSPITAL_COMMUNITY): Payer: Self-pay | Admitting: Gastroenterology

## 2014-02-02 ENCOUNTER — Ambulatory Visit (INDEPENDENT_AMBULATORY_CARE_PROVIDER_SITE_OTHER): Payer: Medicare Other | Admitting: General Surgery

## 2014-02-02 DIAGNOSIS — M19049 Primary osteoarthritis, unspecified hand: Secondary | ICD-10-CM | POA: Diagnosis not present

## 2014-02-02 DIAGNOSIS — G8918 Other acute postprocedural pain: Secondary | ICD-10-CM | POA: Diagnosis not present

## 2014-02-02 DIAGNOSIS — M19039 Primary osteoarthritis, unspecified wrist: Secondary | ICD-10-CM | POA: Diagnosis not present

## 2014-02-02 DIAGNOSIS — M654 Radial styloid tenosynovitis [de Quervain]: Secondary | ICD-10-CM | POA: Diagnosis not present

## 2014-02-05 DIAGNOSIS — M19049 Primary osteoarthritis, unspecified hand: Secondary | ICD-10-CM | POA: Diagnosis not present

## 2014-02-10 ENCOUNTER — Other Ambulatory Visit (HOSPITAL_BASED_OUTPATIENT_CLINIC_OR_DEPARTMENT_OTHER): Payer: Medicare Other

## 2014-02-16 ENCOUNTER — Encounter (INDEPENDENT_AMBULATORY_CARE_PROVIDER_SITE_OTHER): Payer: Self-pay | Admitting: General Surgery

## 2014-02-16 ENCOUNTER — Ambulatory Visit (INDEPENDENT_AMBULATORY_CARE_PROVIDER_SITE_OTHER): Payer: Medicare Other | Admitting: General Surgery

## 2014-02-16 VITALS — BP 112/72 | HR 57 | Temp 98.0°F | Ht 62.0 in | Wt 181.4 lb

## 2014-02-16 DIAGNOSIS — K8020 Calculus of gallbladder without cholecystitis without obstruction: Secondary | ICD-10-CM

## 2014-02-16 NOTE — Patient Instructions (Signed)
Your episodes of abdominal pain are consistent with gallbladder disease. The gallbladder test showed gallstones and your liver function tests are abnormal. There has been no other cause of her pain identified.  You will be scheduled for laparoscopic cholecystectomy with cholangiogram, possible open.  At your request, this will be scheduled at the surgical center of [facility]      Laparoscopic Cholecystectomy Laparoscopic cholecystectomy is surgery to remove the gallbladder. The gallbladder is located in the upper right part of the abdomen, behind the liver. It is a storage sac for bile produced in the liver. Bile aids in the digestion and absorption of fats. Cholecystectomy is often done for inflammation of the gallbladder (cholecystitis). This condition is usually caused by a buildup of gallstones (cholelithiasis) in your gallbladder. Gallstones can block the flow of bile, resulting in inflammation and pain. In severe cases, emergency surgery may be required. When emergency surgery is not required, you will have time to prepare for the procedure. Laparoscopic surgery is an alternative to open surgery. Laparoscopic surgery has a shorter recovery time. Your common bile duct may also need to be examined during the procedure. If stones are found in the common bile duct, they may be removed. LET [facility] CARE PROVIDER KNOW ABOUT:  Any allergies you have.  All medicines you are taking, including vitamins, herbs, eye drops, creams, and over-the-counter medicines.  Previous problems you or members of your family have had with the use of anesthetics.  Any blood disorders you have.  Previous surgeries you have had.  Medical conditions you have. RISKS AND COMPLICATIONS Generally, this is a safe procedure. However, as with any procedure, complications can occur. Possible complications include:  Infection.  Damage to the common bile duct, nerves, arteries, veins, or other internal organs such  as the stomach, liver, or intestines.  Bleeding.  A stone may remain in the common bile duct.  A bile leak from the cyst duct that is clipped when your gallbladder is removed.  The need to convert to open surgery, which requires a larger incision in the abdomen. This may be necessary if your surgeon thinks it is not safe to continue with a laparoscopic procedure. BEFORE THE PROCEDURE  Ask your health care provider about changing or stopping any regular medicines. You will need to stop taking aspirin or blood thinners at least 5 days prior to surgery.  Do not eat or drink anything after midnight the night before surgery.  Let your health care provider know if you develop a cold or other infectious problem before surgery. PROCEDURE   You will be given medicine to make you sleep through the procedure (general anesthetic). A breathing tube will be placed in your mouth.  When you are asleep, your surgeon will make several small cuts (incisions) in your abdomen.  A thin, lighted tube with a tiny camera on the end (laparoscope) is inserted through one of the small incisions. The camera on the laparoscope sends a picture to a TV screen in the operating room. This gives the surgeon a good view inside your abdomen.  A gas will be pumped into your abdomen. This expands your abdomen so that the surgeon has more room to perform the surgery.  Other tools needed for the procedure are inserted through the other incisions. The gallbladder is removed through one of the incisions.  After the removal of your gallbladder, the incisions will be closed with stitches, staples, or skin glue. AFTER THE PROCEDURE  You will be taken to  a recovery area where your progress will be checked often.  You may be allowed to go home the same day if your pain is controlled and you can tolerate liquids. Document Released: 06/05/2005 Document Revised: 03/26/2013 Document Reviewed: 01/15/2013 [facility] Patient Information  2015 [place], [place]. This information is not intended to replace advice given to you by your health care provider. Make sure you discuss any questions you have with your health care provider.

## 2014-02-16 NOTE — Progress Notes (Signed)
Patient ID: Brittany [name], female   DOB: Jun 03, 1956, 58 y.o.   MRN: [id number]  Chief Complaint  Patient presents with  . Follow-up    gallstones    HPI Brittany [name] is a 58 y.o. female.  She is referred by Dr. Ernest Haber her valuation and management of gallstones and abdominal pain.  She may have had some prior episodes of upper abdominal  pain. Last month she had an episode of epigastric pain and dizziness but no nausea or vomiting. She went to the emergency room and they told her she had gallbladder sludge and possible stones and abnormal liver function tests. She saw Dr. Cristina Gong who performed put her on ursadiol onJuly 30.  She has felt better since that time.  Looking back, she had an MRA. On  11/20/2013 which showed a simple renal cyst gallbladder looked normal. Dr. Janice Norrie  has evaluated her  On 01/13/2014 she had an ultrasound which showed gallbladder sludge and possible stones otherwise negative.   She subsequently had liver function test which showed normal bilirubin, mild elevation of valk phos and  transaminases. Lipase borderline elevated at 69.  Endoscopic ultrasound by Dr. Paulita Fujita showed a normal pancreas, no mass, positive for gallstones but no inflammation. CBD normal.  Comorbidities include obstructive sleep apnea, hyperlipidemia, bipolar disorder. She has had a robotic TAH and BSO in  [place]. Family history is positive for gallbladder surgery in the mother. HPI  Past Medical History  Diagnosis Date  . HYPERLIPIDEMIA   . HEMORRHOIDS-INTERNAL   . URI   . OBSTRUCTIVE SLEEP APNEA   . DISORDER, BIPOLAR NEC   . DEPRESSION   . ALOPECIA   . ALLERGIC RHINITIS   . ACNE NEC   . Anal fissure   . Anxiety   . Arthritis   . Chronic headaches   . Depression   . Bowel obstruction   . GERD (gastroesophageal reflux disease)     Past Surgical History  Procedure Laterality Date  . Knee surgery Bilateral     right x 2 left x 1  . Radical hysterectomy    . Tonsillectomy     . Breast lumpectomy Right last done 2005    x 2  . Thumb and pinkie finger surgery Right 2014  . Eus N/A 01/27/2014    Procedure: ESOPHAGEAL ENDOSCOPIC ULTRASOUND (EUS) RADIAL;  Surgeon: Arta Silence, MD;  Location: [facility] ENDOSCOPY;  Service: Endoscopy;  Laterality: N/A;    Family History  Problem Relation Age of Onset  . Breast cancer    . Lung cancer    . Colon cancer Maternal Uncle   . Colon polyps Maternal Uncle   . Other Paternal Aunt     x 2, blood clotting disorder  . Heart disease Sister   . Irritable bowel syndrome Cousin   . Kidney disease Maternal Uncle   . Esophageal cancer Paternal Uncle   . Rectal cancer Neg Hx   . Stomach cancer Neg Hx     Social History History  Substance Use Topics  . Smoking status: Former Smoker -- 0.10 packs/day for 7 years    Types: Cigarettes    Quit date: 06/19/1981  . Smokeless tobacco: Never Used  . Alcohol Use: Yes     Comment: rare    Allergies  Allergen Reactions  . Flexeril [Cyclobenzaprine]     Unable to leave home when taking as she is incoherent/unsure where she is at times  . Hydrocodone     REACTION: GI upset  Current Outpatient Prescriptions  Medication Sig Dispense Refill  . azelastine (ASTELIN) 0.1 % nasal spray Place 1 spray into both nostrils at bedtime. Use in each nostril as directed      . buPROPion (WELLBUTRIN XL) 300 MG 24 hr tablet Take 300 mg by mouth every morning.      . Carbamazepine (EQUETRO) 300 MG CP12 Take 600 mg by mouth at bedtime.       . cetirizine (ZYRTEC) 10 MG tablet Take 10 mg by mouth daily.      Marland Kitchen lamoTRIgine (LAMICTAL) 150 MG tablet Take 300 mg by mouth daily.       . methocarbamol (ROBAXIN) 500 MG tablet Take 500 mg by mouth every 8 (eight) hours as needed for muscle spasms.      . Multiple Vitamin (MULTIVITAMIN WITH MINERALS) TABS Take 1 tablet by mouth daily.      Marland Kitchen omeprazole (PRILOSEC) 40 MG capsule Take 40 mg by mouth daily.      . sertraline (ZOLOFT) 50 MG tablet Take 50 mg by  mouth every morning.       . ursodiol (ACTIGALL) 300 MG capsule Take 300 mg by mouth 2 (two) times daily.       No current facility-administered medications for this visit.   Facility-Administered Medications Ordered in Other Visits  Medication Dose Route Frequency Provider Last Rate Last Dose  . methylPREDNISolone acetate (DEPO-MEDROL) injection 80 mg  80 mg Intra-articular Once Timoteo Gaul, FNP        Review of Systems Review of Systems  Constitutional: Negative for fever, chills and unexpected weight change.  HENT: Negative for congestion, hearing loss, sore throat, trouble swallowing and voice change.   Eyes: Negative for visual disturbance.  Respiratory: Negative for cough and wheezing.   Cardiovascular: Negative for chest pain, palpitations and leg swelling.  Gastrointestinal: Positive for abdominal pain. Negative for nausea, vomiting, diarrhea, constipation, blood in stool, abdominal distention and anal bleeding.  Genitourinary: Negative for hematuria, vaginal bleeding and difficulty urinating.  Musculoskeletal: Positive for arthralgias and joint swelling.  Skin: Negative for rash and wound.  Neurological: Negative for seizures, syncope and headaches.  Hematological: Negative for adenopathy. Does not bruise/bleed easily.  Psychiatric/Behavioral: Negative for confusion. The patient is nervous/anxious.     Blood pressure 112/72, pulse 57, temperature 98 F (36.7 C), temperature source Oral, height 5\' 2"  (1.575 m), weight 181 lb 6 oz (82.271 kg).  Physical Exam Physical Exam  Constitutional: She is oriented to person, place, and time. She appears well-developed and well-nourished. No distress.  HENT:  Head: Normocephalic and atraumatic.  Nose: Nose normal.  Mouth/Throat: No oropharyngeal exudate.  Eyes: Conjunctivae and EOM are normal. Pupils are equal, round, and reactive to light. Left eye exhibits no discharge. No scleral icterus.  Neck: Neck supple. No JVD present.  No tracheal deviation present. No thyromegaly present.  Cardiovascular: Normal rate, regular rhythm, normal heart sounds and intact distal pulses.   No murmur heard. Pulmonary/Chest: Effort normal and breath sounds normal. No respiratory distress. She has no wheezes. She has no rales. She exhibits no tenderness.  Abdominal: Soft. Bowel sounds are normal. She exhibits no distension and no mass. There is no tenderness. There is no rebound and no guarding.  Trocar sites from robotic hysterectomy. Otherwise soft, nontender, benign  Musculoskeletal: She exhibits no edema and no tenderness.  Left wrist hand splint. Recent wrist surgery scar right wrist from prior surgery  Lymphadenopathy:    She has no cervical adenopathy.  Neurological: She is alert and oriented to person, place, and time. She exhibits normal muscle tone. Coordination normal.  Skin: Skin is warm. No rash noted. She is not diaphoretic. No erythema. No pallor.  Psychiatric: She has a normal mood and affect. Her behavior is normal. Judgment and thought content normal.  A Konitzer anxious and  a Cournoyer fearful.    Data Reviewed Lab tests. Imaging studies. Physician notes.  Assessment    Chronic cholecystitis with cholelithiasis. Given the extensive workup to date, pain consistent with gallbladder disease, abnormal LFTs, think it cholecystectomy we appropriate  Bipolar disorder  Status post robotic hysterectomy  Hyperlipidemia  Obstructive sleep apnea     Plan    She'll be scheduled for laparoscopic cholecystectomy with cholangiogram, possible open. [facility] which is  her request. She is fearfu;l  to have this done in the main hospital for fear of infection. I've told her that was probably not an issue but this made her the most comfortable.  I discussed the indications, details, techniques, and numerous risk of the surgery with her. She's aware of the risk of bleeding, infection, conversion to open  laparotomy, bile leak, injury to adjacent organs with major restructured, wound healing problems such as hernia, and other unforseen problems. She understands all these issues. All of her questions are answered. She agrees with this plan.        Sherriann Szuch M 02/16/2014, 12:02 PM

## 2014-03-05 ENCOUNTER — Encounter (HOSPITAL_COMMUNITY): Payer: Self-pay | Admitting: *Deleted

## 2014-03-05 ENCOUNTER — Other Ambulatory Visit (INDEPENDENT_AMBULATORY_CARE_PROVIDER_SITE_OTHER): Payer: Self-pay | Admitting: General Surgery

## 2014-03-05 ENCOUNTER — Encounter (HOSPITAL_COMMUNITY): Payer: Self-pay | Admitting: Pharmacy Technician

## 2014-03-05 MED ORDER — LACTATED RINGERS IV SOLN: INTRAVENOUS | Status: DC

## 2014-03-05 MED ORDER — MEPERIDINE HCL 25 MG/ML IJ SOLN: 6.2500 mg | INTRAMUSCULAR | Status: DC | PRN

## 2014-03-05 MED ORDER — PROMETHAZINE HCL 25 MG/ML IJ SOLN
6.2500 mg | INTRAMUSCULAR | Status: DC | PRN
  Filled 2014-03-05: qty 1

## 2014-03-05 NOTE — Progress Notes (Signed)
Brittany [name] reports chest pain, "I ve had it for a long time.  They ran test and my heart is good."  "They think it was my gallbladder.  ECHO and Stress Test were done in April of this year and they were normal.

## 2014-03-06 MED ORDER — CEFAZOLIN SODIUM-DEXTROSE 2-3 GM-% IV SOLR
2.0000 g | INTRAVENOUS | Status: AC
  Administered 2014-03-07: 2 g via INTRAVENOUS
  Filled 2014-03-06: qty 50

## 2014-03-06 NOTE — H&P (Signed)
Brittany [name]   MRN:  [id number]   Description: 58 year old female  Provider: Fanny Skates, MD  Department: [facility]         Diagnoses      Gallstones    -  Primary      ICD-9-CM: 574.20 ICD-10-CM: K80.20                  Current Vitals Most recent update: 02/16/2014 11:30 AM by Coletta Memos, CMA      BP Pulse Temp(Src) Ht Wt BMI      112/72 57 98 F (36.7 C) (Oral) 5\' 2"  (1.575 m) 181 lb 6 oz (82.271 kg) 33.17 kg/m2           History and Physical   Fanny Skates, MD      Status: Signed            Patient ID: Brittany [name], female   DOB: [birth year], 58 y.o.   MRN: [id number]              HPI Brittany [name] is a 58 y.o. female.  She is referred by Dr. Cristina Gong for evaluation and management of gallstones and abdominal pain.   She may have had some prior episodes of upper abdominal  pain. Last month she had an episode of epigastric pain and dizziness but no nausea or vomiting. She went to the emergency room and they told her she had gallbladder sludge and possible stones and abnormal liver function tests. She saw Dr. Cristina Gong who performed put her on ursadiol onJuly 30.  She has felt better since that time.   Looking back, she had an MRA. On  11/20/2013 which showed a simple renal cyst gallbladder looked normal. Dr. Janice Norrie  has evaluated her   On 01/13/2014 she had an ultrasound which showed gallbladder sludge and possible stones otherwise negative.    She subsequently had liver function test which showed normal bilirubin, mild elevation of valk phos and  transaminases. Lipase borderline elevated at 69.   Endoscopic ultrasound by Dr. Paulita Fujita showed a normal pancreas, no mass, positive for gallstones but no inflammation. CBD normal.   Comorbidities include obstructive sleep apnea, hyperlipidemia, bipolar disorder. She has had a robotic TAH and BSO in  [place]. Family history is positive for gallbladder surgery in the mother.         Past Medical History   Diagnosis  Date   .  HYPERLIPIDEMIA     .  HEMORRHOIDS-INTERNAL     .  URI     .  OBSTRUCTIVE SLEEP APNEA     .  DISORDER, BIPOLAR NEC     .  DEPRESSION     .  ALOPECIA     .  ALLERGIC RHINITIS     .  ACNE NEC     .  Anal fissure     .  Anxiety     .  Arthritis     .  Chronic headaches     .  Depression     .  Bowel obstruction     .  GERD (gastroesophageal reflux disease)           Past Surgical History   Procedure  Laterality  Date   .  Knee surgery  Bilateral         right x 2 left x 1   .  Radical hysterectomy       .  Tonsillectomy       .  Breast lumpectomy  Right  last done 2005       x 2   .  Thumb and pinkie finger surgery  Right  2014   .  Eus  N/A  01/27/2014       Procedure: ESOPHAGEAL ENDOSCOPIC ULTRASOUND (EUS) RADIAL;  Surgeon: Arta Silence, MD;  Location: [facility];  Service: Endoscopy;  Laterality: N/A;         Family History   Problem  Relation  Age of Onset   .  Breast cancer       .  Lung cancer       .  Colon cancer  Maternal Uncle     .  Colon polyps  Maternal Uncle     .  Other  Paternal Aunt         x 2, blood clotting disorder   .  Heart disease  Sister     .  Irritable bowel syndrome  Cousin     .  Kidney disease  Maternal Uncle     .  Esophageal cancer  Paternal Uncle     .  Rectal cancer  Neg Hx     .  Stomach cancer  Neg Hx          Social History History   Substance Use Topics   .  Smoking status:  Former Smoker -- 0.10 packs/day for 7 years       Types:  Cigarettes       Quit date:  06/19/1981   .  Smokeless tobacco:  Never Used   .  Alcohol Use:  Yes         Comment: rare         Allergies   Allergen  Reactions   .  Flexeril [Cyclobenzaprine]         Unable to leave home when taking as she is incoherent/unsure where she is at times   .  Hydrocodone         REACTION: GI upset         Current Outpatient Prescriptions   Medication  Sig  Dispense  Refill   .  azelastine (ASTELIN)  0.1 % nasal spray  Place 1 spray into both nostrils at bedtime. Use in each nostril as directed         .  buPROPion (WELLBUTRIN XL) 300 MG 24 hr tablet  Take 300 mg by mouth every morning.         .  Carbamazepine (EQUETRO) 300 MG CP12  Take 600 mg by mouth at bedtime.          .  cetirizine (ZYRTEC) 10 MG tablet  Take 10 mg by mouth daily.         Marland Kitchen  lamoTRIgine (LAMICTAL) 150 MG tablet  Take 300 mg by mouth daily.          .  methocarbamol (ROBAXIN) 500 MG tablet  Take 500 mg by mouth every 8 (eight) hours as needed for muscle spasms.         .  Multiple Vitamin (MULTIVITAMIN WITH MINERALS) TABS  Take 1 tablet by mouth daily.         Marland Kitchen  omeprazole (PRILOSEC) 40 MG capsule  Take 40 mg by mouth daily.         .  sertraline (ZOLOFT) 50 MG tablet  Take 50 mg by mouth every morning.          .  ursodiol (ACTIGALL) 300  MG capsule  Take 300 mg by mouth 2 (two) times daily.             No current facility-administered medications for this visit.       Facility-Administered Medications Ordered in Other Visits   Medication  Dose  Route  Frequency  Provider  Last Rate  Last Dose   .  methylPREDNISolone acetate (DEPO-MEDROL) injection 80 mg   80 mg  Intra-articular  Once  Timoteo Gaul, FNP              Review of Systems   Constitutional: Negative for fever, chills and unexpected weight change.  HENT: Negative for congestion, hearing loss, sore throat, trouble swallowing and voice change.   Eyes: Negative for visual disturbance.  Respiratory: Negative for cough and wheezing.   Cardiovascular: Negative for chest pain, palpitations and leg swelling.  Gastrointestinal: Positive for abdominal pain. Negative for nausea, vomiting, diarrhea, constipation, blood in stool, abdominal distention and anal bleeding.  Genitourinary: Negative for hematuria, vaginal bleeding and difficulty urinating.  Musculoskeletal: Positive for arthralgias and joint swelling.  Skin: Negative for rash and wound.   Neurological: Negative for seizures, syncope and headaches.  Hematological: Negative for adenopathy. Does not bruise/bleed easily.  Psychiatric/Behavioral: Negative for confusion. The patient is nervous/anxious.       Blood pressure 112/72, pulse 57, temperature 98 F (36.7 C), temperature source Oral, height 5\' 2"  (1.575 m), weight 181 lb 6 oz (82.271 kg).   Physical Exam   Constitutional: She is oriented to person, place, and time. She appears well-developed and well-nourished. No distress.  HENT:   Head: Normocephalic and atraumatic.   Nose: Nose normal.   Mouth/Throat: No oropharyngeal exudate.  Eyes: Conjunctivae and EOM are normal. Pupils are equal, round, and reactive to light. Left eye exhibits no discharge. No scleral icterus.  Neck: Neck supple. No JVD present. No tracheal deviation present. No thyromegaly present.  Cardiovascular: Normal rate, regular rhythm, normal heart sounds and intact distal pulses.    No murmur heard. Pulmonary/Chest: Effort normal and breath sounds normal. No respiratory distress. She has no wheezes. She has no rales. She exhibits no tenderness.  Abdominal: Soft. Bowel sounds are normal. She exhibits no distension and no mass. There is no tenderness. There is no rebound and no guarding.  Trocar sites from robotic hysterectomy. Otherwise soft, nontender, benign  Musculoskeletal: She exhibits no edema and no tenderness.  Left wrist hand splint. Recent wrist surgery scar right wrist from prior surgery  Lymphadenopathy:    She has no cervical adenopathy.  Neurological: She is alert and oriented to person, place, and time. She exhibits normal muscle tone. Coordination normal.  Skin: Skin is warm. No rash noted. She is not diaphoretic. No erythema. No pallor.  Psychiatric: She has a normal mood and affect. Her behavior is normal. Judgment and thought content normal.  A Hamblen anxious and  a Buis fearful.      Data Reviewed Lab tests. Imaging  studies. Physician notes.   Assessment    Chronic cholecystitis with cholelithiasis. Given the extensive workup to date, pain consistent with gallbladder disease, abnormal LFTs, think that cholecystectomy we appropriate   Bipolar disorder   Status post robotic hysterectomy   Hyperlipidemia   Obstructive sleep apnea      Plan    She'll be scheduled for laparoscopic cholecystectomy with cholangiogram, possible open. This will be scheduled at her request.   I discussed the indications, details, techniques, and numerous risk of the  surgery with her. She's aware of the risk of bleeding, infection, conversion to open laparotomy, bile leak, injury to adjacent organs with major restructured, wound healing problems such as hernia, and other unforseen problems. She understands all these issues. All of her questions are answered. She agrees with this plan.           Adin Hector

## 2014-03-07 ENCOUNTER — Ambulatory Visit (HOSPITAL_COMMUNITY): Payer: Medicare Other | Admitting: Anesthesiology

## 2014-03-07 ENCOUNTER — Encounter (HOSPITAL_COMMUNITY)
Admission: RE | Disposition: A | Discharge status: Home / Self Care | Payer: Self-pay | Source: Ambulatory Visit | Attending: General Surgery

## 2014-03-07 ENCOUNTER — Encounter (HOSPITAL_COMMUNITY): Payer: Self-pay | Admitting: *Deleted

## 2014-03-07 ENCOUNTER — Encounter (HOSPITAL_COMMUNITY): Payer: Medicare Other | Admitting: Anesthesiology

## 2014-03-07 ENCOUNTER — Ambulatory Visit (HOSPITAL_COMMUNITY)
Admission: RE | Admit: 2014-03-07 | Discharge: 2014-03-08 | Disposition: A | Discharge status: Home / Self Care | Payer: Medicare Other | Source: Ambulatory Visit | Attending: General Surgery | Admitting: General Surgery

## 2014-03-07 ENCOUNTER — Ambulatory Visit (HOSPITAL_COMMUNITY): Payer: Medicare Other

## 2014-03-07 DIAGNOSIS — Z885 Allergy status to narcotic agent status: Secondary | ICD-10-CM | POA: Insufficient documentation

## 2014-03-07 DIAGNOSIS — L659 Nonscarring hair loss, unspecified: Secondary | ICD-10-CM | POA: Insufficient documentation

## 2014-03-07 DIAGNOSIS — Z79899 Other long term (current) drug therapy: Secondary | ICD-10-CM | POA: Diagnosis not present

## 2014-03-07 DIAGNOSIS — K8010 Calculus of gallbladder with chronic cholecystitis without obstruction: Secondary | ICD-10-CM | POA: Diagnosis not present

## 2014-03-07 DIAGNOSIS — G4730 Sleep apnea, unspecified: Secondary | ICD-10-CM | POA: Diagnosis not present

## 2014-03-07 DIAGNOSIS — R51 Headache: Secondary | ICD-10-CM | POA: Diagnosis not present

## 2014-03-07 DIAGNOSIS — Z90710 Acquired absence of both cervix and uterus: Secondary | ICD-10-CM | POA: Diagnosis not present

## 2014-03-07 DIAGNOSIS — K8020 Calculus of gallbladder without cholecystitis without obstruction: Secondary | ICD-10-CM

## 2014-03-07 DIAGNOSIS — Z7982 Long term (current) use of aspirin: Secondary | ICD-10-CM | POA: Diagnosis not present

## 2014-03-07 DIAGNOSIS — E785 Hyperlipidemia, unspecified: Secondary | ICD-10-CM | POA: Insufficient documentation

## 2014-03-07 DIAGNOSIS — F411 Generalized anxiety disorder: Secondary | ICD-10-CM | POA: Insufficient documentation

## 2014-03-07 DIAGNOSIS — K219 Gastro-esophageal reflux disease without esophagitis: Secondary | ICD-10-CM | POA: Diagnosis not present

## 2014-03-07 DIAGNOSIS — Z87891 Personal history of nicotine dependence: Secondary | ICD-10-CM | POA: Diagnosis not present

## 2014-03-07 DIAGNOSIS — F319 Bipolar disorder, unspecified: Secondary | ICD-10-CM | POA: Insufficient documentation

## 2014-03-07 DIAGNOSIS — Z888 Allergy status to other drugs, medicaments and biological substances status: Secondary | ICD-10-CM | POA: Diagnosis not present

## 2014-03-07 DIAGNOSIS — R109 Unspecified abdominal pain: Secondary | ICD-10-CM | POA: Diagnosis present

## 2014-03-07 HISTORY — DX: Constipation, unspecified: K59.00

## 2014-03-07 HISTORY — PX: CHOLECYSTECTOMY: SHX55

## 2014-03-07 HISTORY — DX: Other seasonal allergic rhinitis: J30.2

## 2014-03-07 LAB — CBC WITH DIFFERENTIAL/PLATELET
BASOS PCT: 0 % (ref 0–1)
Basophils Absolute: 0 10*3/uL (ref 0.0–0.1)
Eosinophils Absolute: 0.1 10*3/uL (ref 0.0–0.7)
Eosinophils Relative: 2 % (ref 0–5)
HCT: 34.9 % — ABNORMAL LOW (ref 36.0–46.0)
Hemoglobin: 12.2 g/dL (ref 12.0–15.0)
LYMPHS ABS: 2.5 10*3/uL (ref 0.7–4.0)
Lymphocytes Relative: 48 % — ABNORMAL HIGH (ref 12–46)
MCH: 30.7 pg (ref 26.0–34.0)
MCHC: 35 g/dL (ref 30.0–36.0)
MCV: 87.7 fL (ref 78.0–100.0)
Monocytes Absolute: 0.3 10*3/uL (ref 0.1–1.0)
Monocytes Relative: 6 % (ref 3–12)
NEUTROS PCT: 44 % (ref 43–77)
Neutro Abs: 2.3 10*3/uL (ref 1.7–7.7)
PLATELETS: 245 10*3/uL (ref 150–400)
RBC: 3.98 MIL/uL (ref 3.87–5.11)
RDW: 12.8 % (ref 11.5–15.5)
WBC: 5.3 10*3/uL (ref 4.0–10.5)

## 2014-03-07 LAB — COMPREHENSIVE METABOLIC PANEL
ALK PHOS: 153 U/L — AB (ref 39–117)
ALT: 163 U/L — AB (ref 0–35)
AST: 22 U/L (ref 0–37)
Albumin: 3.4 g/dL — ABNORMAL LOW (ref 3.5–5.2)
Anion gap: 11 (ref 5–15)
BUN: 8 mg/dL (ref 6–23)
CO2: 23 mEq/L (ref 19–32)
Calcium: 10.1 mg/dL (ref 8.4–10.5)
Chloride: 108 mEq/L (ref 96–112)
Creatinine, Ser: 0.56 mg/dL (ref 0.50–1.10)
GFR calc Af Amer: >90 mL/min (ref >90)
GFR calc non Af Amer: >90 mL/min (ref >90)
Glucose, Bld: 101 mg/dL — ABNORMAL HIGH (ref 70–99)
POTASSIUM: 3.8 meq/L (ref 3.7–5.3)
SODIUM: 142 meq/L (ref 137–147)
TOTAL PROTEIN: 6.3 g/dL (ref 6.0–8.3)
Total Bilirubin: <0.2 mg/dL — ABNORMAL LOW (ref 0.3–1.2)

## 2014-03-07 SURGERY — LAPAROSCOPIC CHOLECYSTECTOMY WITH INTRAOPERATIVE CHOLANGIOGRAM
Anesthesia: General | Site: Abdomen

## 2014-03-07 MED ORDER — ROCURONIUM BROMIDE 100 MG/10ML IV SOLN
INTRAVENOUS | Status: DC | PRN
  Administered 2014-03-07: 35 mg via INTRAVENOUS
  Administered 2014-03-07: 5 mg via INTRAVENOUS

## 2014-03-07 MED ORDER — BUPIVACAINE-EPINEPHRINE 0.5% -1:200000 IJ SOLN
INTRAMUSCULAR | Status: DC | PRN
  Administered 2014-03-07: 9 mL

## 2014-03-07 MED ORDER — SODIUM CHLORIDE 0.9 % IJ SOLN: 3.0000 mL | INTRAMUSCULAR | Status: DC | PRN

## 2014-03-07 MED ORDER — HYDROCODONE-ACETAMINOPHEN 5-325 MG PO TABS: 1.0000 | ORAL_TABLET | Freq: Four times a day (QID) | ORAL | Status: DC | PRN

## 2014-03-07 MED ORDER — FENTANYL CITRATE 0.05 MG/ML IJ SOLN
INTRAMUSCULAR | Status: DC | PRN
  Administered 2014-03-07: 50 ug via INTRAVENOUS
  Administered 2014-03-07: 100 ug via INTRAVENOUS

## 2014-03-07 MED ORDER — ONDANSETRON HCL 4 MG/2ML IJ SOLN
INTRAMUSCULAR | Status: DC | PRN
  Administered 2014-03-07: 4 mg via INTRAVENOUS

## 2014-03-07 MED ORDER — SODIUM CHLORIDE 0.9 % IV SOLN: 250.0000 mL | INTRAVENOUS | Status: DC | PRN

## 2014-03-07 MED ORDER — PROPOFOL 10 MG/ML IV BOLUS
INTRAVENOUS | Status: DC | PRN
  Administered 2014-03-07: 160 mg via INTRAVENOUS

## 2014-03-07 MED ORDER — MIDAZOLAM HCL 5 MG/5ML IJ SOLN
INTRAMUSCULAR | Status: DC | PRN
  Administered 2014-03-07: 0.5 mg via INTRAVENOUS
  Administered 2014-03-07: 1 mg via INTRAVENOUS

## 2014-03-07 MED ORDER — FENTANYL CITRATE 0.05 MG/ML IJ SOLN
INTRAMUSCULAR | Status: AC
  Filled 2014-03-07: qty 2

## 2014-03-07 MED ORDER — CHLORHEXIDINE GLUCONATE 4 % EX LIQD: 1.0000 "application " | Freq: Once | CUTANEOUS | Status: DC

## 2014-03-07 MED ORDER — SODIUM CHLORIDE 0.9 % IJ SOLN
3.0000 mL | Freq: Two times a day (BID) | INTRAMUSCULAR | Status: DC
  Administered 2014-03-07 (×2): 3 mL via INTRAVENOUS

## 2014-03-07 MED ORDER — FENTANYL CITRATE 0.05 MG/ML IJ SOLN
INTRAMUSCULAR | Status: AC
  Administered 2014-03-07: 50 ug via INTRAVENOUS
  Filled 2014-03-07: qty 2

## 2014-03-07 MED ORDER — LIDOCAINE HCL (CARDIAC) 20 MG/ML IV SOLN
INTRAVENOUS | Status: AC
  Filled 2014-03-07: qty 5

## 2014-03-07 MED ORDER — OXYCODONE HCL 5 MG PO TABS
ORAL_TABLET | ORAL | Status: AC
  Administered 2014-03-07: 10 mg via ORAL
  Filled 2014-03-07: qty 2

## 2014-03-07 MED ORDER — EPHEDRINE SULFATE 50 MG/ML IJ SOLN
INTRAMUSCULAR | Status: AC
  Filled 2014-03-07: qty 1

## 2014-03-07 MED ORDER — ARTIFICIAL TEARS OP OINT
TOPICAL_OINTMENT | OPHTHALMIC | Status: AC
  Filled 2014-03-07: qty 3.5

## 2014-03-07 MED ORDER — SODIUM CHLORIDE 0.9 % IV SOLN: INTRAVENOUS | Status: DC

## 2014-03-07 MED ORDER — GLYCOPYRROLATE 0.2 MG/ML IJ SOLN
INTRAMUSCULAR | Status: AC
  Filled 2014-03-07: qty 2

## 2014-03-07 MED ORDER — EPHEDRINE SULFATE 50 MG/ML IJ SOLN
INTRAMUSCULAR | Status: DC | PRN
  Administered 2014-03-07: 5 mg via INTRAVENOUS

## 2014-03-07 MED ORDER — OXYCODONE HCL 5 MG PO TABS
5.0000 mg | ORAL_TABLET | ORAL | Status: DC | PRN
  Administered 2014-03-07 – 2014-03-08 (×6): 10 mg via ORAL
  Filled 2014-03-07 (×5): qty 2

## 2014-03-07 MED ORDER — SODIUM CHLORIDE 0.9 % IJ SOLN
INTRAMUSCULAR | Status: AC
  Filled 2014-03-07: qty 10

## 2014-03-07 MED ORDER — DEXAMETHASONE SODIUM PHOSPHATE 4 MG/ML IJ SOLN
INTRAMUSCULAR | Status: AC
  Filled 2014-03-07: qty 1

## 2014-03-07 MED ORDER — SUCCINYLCHOLINE CHLORIDE 20 MG/ML IJ SOLN
INTRAMUSCULAR | Status: AC
  Filled 2014-03-07: qty 1

## 2014-03-07 MED ORDER — GLYCOPYRROLATE 0.2 MG/ML IJ SOLN
INTRAMUSCULAR | Status: DC | PRN
  Administered 2014-03-07: .4 mg via INTRAVENOUS

## 2014-03-07 MED ORDER — ROCURONIUM BROMIDE 50 MG/5ML IV SOLN
INTRAVENOUS | Status: AC
  Filled 2014-03-07: qty 1

## 2014-03-07 MED ORDER — FENTANYL CITRATE 0.05 MG/ML IJ SOLN
INTRAMUSCULAR | Status: AC
  Filled 2014-03-07: qty 5

## 2014-03-07 MED ORDER — DEXAMETHASONE SODIUM PHOSPHATE 10 MG/ML IJ SOLN
INTRAMUSCULAR | Status: DC | PRN
Start: 2014-03-07 — End: 2014-03-07
  Administered 2014-03-07: 4 mg via INTRAVENOUS

## 2014-03-07 MED ORDER — FENTANYL CITRATE 0.05 MG/ML IJ SOLN
25.0000 ug | INTRAMUSCULAR | Status: DC | PRN
  Administered 2014-03-07 (×2): 50 ug via INTRAVENOUS
  Administered 2014-03-07 (×2): 25 ug via INTRAVENOUS

## 2014-03-07 MED ORDER — SODIUM CHLORIDE 0.9 % IR SOLN
Status: DC | PRN
  Administered 2014-03-07: 1000 mL

## 2014-03-07 MED ORDER — IOHEXOL 300 MG/ML  SOLN
INTRAMUSCULAR | Status: DC | PRN
  Administered 2014-03-07: 10:00:00

## 2014-03-07 MED ORDER — NEOSTIGMINE METHYLSULFATE 10 MG/10ML IV SOLN
INTRAVENOUS | Status: DC | PRN
  Administered 2014-03-07: 3 mg via INTRAVENOUS

## 2014-03-07 MED ORDER — PROPOFOL 10 MG/ML IV BOLUS
INTRAVENOUS | Status: AC
  Filled 2014-03-07: qty 20

## 2014-03-07 MED ORDER — 0.9 % SODIUM CHLORIDE (POUR BTL) OPTIME
TOPICAL | Status: DC | PRN
  Administered 2014-03-07: 1000 mL

## 2014-03-07 MED ORDER — ACETAMINOPHEN 650 MG RE SUPP: 650.0000 mg | RECTAL | Status: DC | PRN

## 2014-03-07 MED ORDER — NEOSTIGMINE METHYLSULFATE 10 MG/10ML IV SOLN
INTRAVENOUS | Status: AC
  Filled 2014-03-07: qty 1

## 2014-03-07 MED ORDER — BUPIVACAINE-EPINEPHRINE (PF) 0.5% -1:200000 IJ SOLN
INTRAMUSCULAR | Status: AC
  Filled 2014-03-07: qty 30

## 2014-03-07 MED ORDER — FENTANYL CITRATE 0.05 MG/ML IJ SOLN: 25.0000 ug | INTRAMUSCULAR | Status: DC | PRN

## 2014-03-07 MED ORDER — ACETAMINOPHEN 325 MG PO TABS
ORAL_TABLET | ORAL | Status: AC
  Administered 2014-03-07: 650 mg via ORAL
  Filled 2014-03-07: qty 2

## 2014-03-07 MED ORDER — MIDAZOLAM HCL 2 MG/2ML IJ SOLN
INTRAMUSCULAR | Status: AC
  Filled 2014-03-07: qty 2

## 2014-03-07 MED ORDER — ONDANSETRON HCL 4 MG/2ML IJ SOLN
INTRAMUSCULAR | Status: AC
  Filled 2014-03-07: qty 2

## 2014-03-07 MED ORDER — LACTATED RINGERS IV SOLN
INTRAVENOUS | Status: DC | PRN
  Administered 2014-03-07 (×2): via INTRAVENOUS

## 2014-03-07 MED ORDER — LIDOCAINE HCL (CARDIAC) 20 MG/ML IV SOLN
INTRAVENOUS | Status: DC | PRN
  Administered 2014-03-07: 60 mg via INTRAVENOUS

## 2014-03-07 MED ORDER — ACETAMINOPHEN 325 MG PO TABS
650.0000 mg | ORAL_TABLET | ORAL | Status: DC | PRN
  Administered 2014-03-07 – 2014-03-08 (×4): 650 mg via ORAL
  Filled 2014-03-07 (×3): qty 2

## 2014-03-07 SURGICAL SUPPLY — 37 items
APPLIER CLIP ROT 10 11.4 M/L (STAPLE) ×2
BLADE SURG ROTATE 9660 (MISCELLANEOUS) IMPLANT
CANISTER SUCTION 2500CC (MISCELLANEOUS) ×2 IMPLANT
CHLORAPREP W/TINT 26ML (MISCELLANEOUS) ×2 IMPLANT
CLIP APPLIE ROT 10 11.4 M/L (STAPLE) ×1 IMPLANT
COVER MAYO STAND STRL (DRAPES) ×2 IMPLANT
COVER SURGICAL LIGHT HANDLE (MISCELLANEOUS) ×2 IMPLANT
DECANTER SPIKE VIAL GLASS SM (MISCELLANEOUS) ×4 IMPLANT
DERMABOND ADVANCED (GAUZE/BANDAGES/DRESSINGS) ×1
DERMABOND ADVANCED .7 DNX12 (GAUZE/BANDAGES/DRESSINGS) ×1 IMPLANT
DRAPE C-ARM 42X72 X-RAY (DRAPES) ×2 IMPLANT
DRAPE UTILITY 15X26 W/TAPE STR (DRAPE) ×4 IMPLANT
ELECT REM PT RETURN 9FT ADLT (ELECTROSURGICAL) ×2
ELECTRODE REM PT RTRN 9FT ADLT (ELECTROSURGICAL) ×1 IMPLANT
GLOVE EUDERMIC 7 POWDERFREE (GLOVE) ×2 IMPLANT
GOWN STRL REUS W/ TWL LRG LVL3 (GOWN DISPOSABLE) ×3 IMPLANT
GOWN STRL REUS W/ TWL XL LVL3 (GOWN DISPOSABLE) ×1 IMPLANT
GOWN STRL REUS W/TWL LRG LVL3 (GOWN DISPOSABLE) ×3
GOWN STRL REUS W/TWL XL LVL3 (GOWN DISPOSABLE) ×1
KIT BASIN OR (CUSTOM PROCEDURE TRAY) ×2 IMPLANT
KIT ROOM TURNOVER OR (KITS) ×2 IMPLANT
NS IRRIG 1000ML POUR BTL (IV SOLUTION) ×2 IMPLANT
PAD ARMBOARD 7.5X6 YLW CONV (MISCELLANEOUS) ×2 IMPLANT
POUCH SPECIMEN RETRIEVAL 10MM (ENDOMECHANICALS) ×2 IMPLANT
SCISSORS LAP 5X35 DISP (ENDOMECHANICALS) ×2 IMPLANT
SET CHOLANGIOGRAPH 5 50 .035 (SET/KITS/TRAYS/PACK) ×2 IMPLANT
SET IRRIG TUBING LAPAROSCOPIC (IRRIGATION / IRRIGATOR) ×2 IMPLANT
SLEEVE ENDOPATH XCEL 5M (ENDOMECHANICALS) ×2 IMPLANT
SPECIMEN JAR SMALL (MISCELLANEOUS) ×2 IMPLANT
SUT MNCRL AB 4-0 PS2 18 (SUTURE) ×2 IMPLANT
TOWEL OR 17X24 6PK STRL BLUE (TOWEL DISPOSABLE) ×2 IMPLANT
TOWEL OR 17X26 10 PK STRL BLUE (TOWEL DISPOSABLE) ×2 IMPLANT
TRAY LAPAROSCOPIC (CUSTOM PROCEDURE TRAY) ×2 IMPLANT
TROCAR XCEL BLUNT TIP 100MML (ENDOMECHANICALS) ×2 IMPLANT
TROCAR XCEL NON-BLD 11X100MML (ENDOMECHANICALS) ×2 IMPLANT
TROCAR XCEL NON-BLD 5MMX100MML (ENDOMECHANICALS) ×2 IMPLANT
TUBING INSUFF HIGH FLOW RTP (TUBING) ×2 IMPLANT

## 2014-03-07 NOTE — Interval H&P Note (Signed)
History and Physical Interval Note:  03/07/2014 7:34 AM  Brittany [name]  has presented today for surgery, with the diagnosis of gallstones  The goals and the various methods of treatment have been discussed with the patient and family. After consideration of risks, benefits and other options for treatment, the patient has consented to  Procedure(s): LAPAROSCOPIC CHOLECYSTECTOMY WITH INTRAOPERATIVE CHOLANGIOGRAM (N/A) as a surgical intervention .  The patient's history has been reviewed, patient is examined today , no change in status, stable for surgery.  I have reviewed the patient's chart and labs.  Questions were answered to the patient's satisfaction.     Adin Hector

## 2014-03-07 NOTE — Discharge Instructions (Signed)
[facility] ______[facility] °LAPAROSCOPIC SURGERY: POST OP INSTRUCTIONS °Always review your discharge instruction sheet given to you by the facility where your surgery was performed. °IF YOU HAVE DISABILITY OR FAMILY LEAVE FORMS, YOU MUST BRING THEM TO THE OFFICE FOR PROCESSING.   °DO NOT GIVE THEM TO YOUR DOCTOR. ° °1. A prescription for pain medication may be given to you upon discharge.  Take your pain medication as prescribed, if needed.  If narcotic pain medicine is not needed, then you may take acetaminophen (Tylenol) or ibuprofen (Advil) as needed. °2. Take your usually prescribed medications unless otherwise directed. °3. If you need a refill on your pain medication, please contact your pharmacy.  They will contact our office to request authorization. Prescriptions will not be filled after 5pm or on week-ends. °4. You should follow a light diet the first few days after arrival home, such as soup and crackers, etc.  Be sure to include lots of fluids daily. °5. Most patients will experience some swelling and bruising in the area of the incisions.  Ice packs will help.  Swelling and bruising can take several days to resolve.  °6. It is common to experience some constipation if taking pain medication after surgery.  Increasing fluid intake and taking a stool softener (such as Colace) will usually help or prevent this problem from occurring.  A mild laxative (Milk of Magnesia or Miralax) should be taken according to package instructions if there are no bowel movements after 48 hours. °7. Unless discharge instructions indicate otherwise, you may remove your bandages 24-48 hours after surgery, and you may shower at that time.  You may have steri-strips (small skin tapes) in place directly over the incision.  These strips should be left on the skin for 7-10 days.  If your surgeon used skin glue on the incision, you may shower in 24 hours.  The glue will flake off over the next 2-3 weeks.  Any sutures or  staples will be removed at the office during your follow-up visit. °8. ACTIVITIES:  You may resume regular (light) daily activities beginning the next day--such as daily self-care, walking, climbing stairs--gradually increasing activities as tolerated.  You may have sexual intercourse when it is comfortable.  Refrain from any heavy lifting or straining until approved by your doctor. °a. You may drive when you are no longer taking prescription pain medication, you can comfortably wear a seatbelt, and you can safely maneuver your car and apply brakes. °b. RETURN TO WORK:  __________________________________________________________ °9. You should see your doctor in the office for a follow-up appointment approximately 2-3 weeks after your surgery.  Make sure that you call for this appointment within a day or two after you arrive home to insure a convenient appointment time. °10. OTHER INSTRUCTIONS: __________________________________________________________________________________________________________________________ __________________________________________________________________________________________________________________________ °WHEN TO CALL YOUR DOCTOR: °1. Fever over 101.0 °2. Inability to urinate °3. Continued bleeding from incision. °4. Increased pain, redness, or drainage from the incision. °5. Increasing abdominal pain ° °The clinic staff is available to answer your questions during regular business hours.  Please don’t hesitate to call and ask to speak to one of the nurses for clinical concerns.  If you have a medical emergency, go to the nearest emergency room or call 911.  A surgeon from [facility] is always on call at the hospital. °[address], [address], [place], [place]  [address] ? [address] [address], , [place]   [address] °[phone] ? [phone] ? FAX [phone] °Web site:   [email or web address] °

## 2014-03-07 NOTE — Anesthesia Postprocedure Evaluation (Signed)
  Anesthesia Post-op Note  Patient: Brittany [name]  Procedure(s) Performed: Procedure(s): LAPAROSCOPIC CHOLECYSTECTOMY WITH INTRAOPERATIVE CHOLANGIOGRAM (N/A)  Patient Location: PACU  Anesthesia Type:General  Level of Consciousness: awake  Airway and Oxygen Therapy: Patient Spontanous Breathing  Post-op Pain: mild  Post-op Assessment: Post-op Vital signs reviewed  Post-op Vital Signs: Reviewed  Last Vitals:  Filed Vitals:   03/07/14 0740  BP: 111/66  Pulse: 55  Temp: 37.3 C  Resp: 16    Complications: No apparent anesthesia complications

## 2014-03-07 NOTE — Transfer of Care (Signed)
Immediate Anesthesia Transfer of Care Note  Patient: Brittany [name]  Procedure(s) Performed: Procedure(s): LAPAROSCOPIC CHOLECYSTECTOMY WITH INTRAOPERATIVE CHOLANGIOGRAM (N/A)  Patient Location: PACU  Anesthesia Type:General  Level of Consciousness: awake, sedated and patient cooperative  Airway & Oxygen Therapy: Patient Spontanous Breathing and Patient connected to face mask oxygen  Post-op Assessment: Report given to PACU RN, Post -op Vital signs reviewed and stable and Patient moving all extremities  Post vital signs: Reviewed and stable  Complications: No apparent anesthesia complications

## 2014-03-07 NOTE — Anesthesia Procedure Notes (Signed)
Procedure Name: Intubation Date/Time: 03/07/2014 9:08 AM Performed by: Izora Gala Pre-anesthesia Checklist: Patient identified, Emergency Drugs available, Suction available and Patient being monitored Patient Re-evaluated:Patient Re-evaluated prior to inductionOxygen Delivery Method: Circle system utilized Preoxygenation: Pre-oxygenation with 100% oxygen Intubation Type: IV induction Ventilation: Mask ventilation without difficulty Laryngoscope Size: Miller and 3 Grade View: Grade II Tube type: Oral Tube size: 7.0 mm Number of attempts: 1 Airway Equipment and Method: Stylet Placement Confirmation: ETT inserted through vocal cords under direct vision,  positive ETCO2 and breath sounds checked- equal and bilateral Secured at: 21 cm Tube secured with: Tape Dental Injury: Teeth and Oropharynx as per pre-operative assessment

## 2014-03-07 NOTE — Op Note (Signed)
Patient Name:           Brittany [name]   Date of Surgery:        03/07/2014  Pre op Diagnosis:      Chronic cholecystitis with cholelithiasis  Post op Diagnosis:    Same  Procedure:                 Laparoscopic cholecystectomy with cholangiogram  Surgeon:                     Edsel Petrin. Dalbert Batman, M.D., FACS  Assistant:                      OR staff  Operative Indications:   Brittany [name] is a 58 y.o. female. She is referred by Dr. Cristina Gong for evaluation and management of gallstones and abdominal pain.  She may have had some prior episodes of upper abdominal pain. Last month she had an episode of epigastric pain and dizziness but no nausea or vomiting. She went to the emergency room and they told her she had gallbladder sludge and possible stones and abnormal liver function tests. She saw Dr. Cristina Gong who put her on ursadiol on July 30. She has felt better since that time.  Looking back, she had an MRA. On 11/20/2013 which showed a simple renal cyst gallbladder looked normal. Dr. Janice Norrie has evaluated her  On 01/13/2014 she had an ultrasound which showed gallbladder sludge and possible stones otherwise negative.  She subsequently had liver function test which showed normal bilirubin,  but elevation of alk phos and transaminases. Lipase borderline elevated at 69.  Endoscopic ultrasound by Dr. Paulita Fujita showed a normal pancreas, no mass, positive for gallstones but no inflammation. CBD normal. She was then referred to me.  Over the past 7 days she's been having daily pain and nausea and we rescheduled for surgery sooner, to be done today. Comorbidities include obstructive sleep apnea, hyperlipidemia, bipolar disorder. She has had a robotic TAH and BSO in [place].   Operative Findings:       The gallbladder was chronically inflamed, discolored and slightly thickwalled. The anatomy of the cystic duct cystic artery and common bile duct were conventional. The liver looked relatively healthy. Normal texture  and color. The intraoperative cholangiogram was normal showing normal intrahepatic and extrahepatic biliary anatomy, no filling defects, and no obstruction with good flow of contrast into the duodenum. The stomach, duodenum, small intestine, large intestine were grossly normal to inspection  Procedure in Detail:          Following the induction of general endotracheal anesthesia the patient's abdomen was prepped and draped in a sterile fashion, surgical time out was performed and intravenous antibiotics were given. 0.5% Marcaine with epinephrine was used as a local infiltration anesthetic.    A vertical incision was made at the lower rim of the umbilicus. The fascia was incised in the midline and  the abdominal cavity entered under direct vision. An 11 mm Hassan trocar was inserted and secured with perching suture of 0 Vicryl. Pneumoperitoneum was created and video  camera was inserted. An 11 mm trocar was placed in the subxiphoid region and two 5 mm trocars in the right upper quadrant. The gallbladder fundus was grasped and elevated. The infundibulum was dissected free of peritoneal coverings and  adhesions. We slowly dissected out the cystic duct and cystic artery. We created a window behind the cystic duct and cystic artery.  A cholangiogram  catheter was inserted into the cystic duct and a cholangiogram was obtained using the C-arm. The cholangiogram was normal as described above. The catheter was removed, the cystic duct was secured with multiple metal clips and divided. Cystic artery was secured with multiple metal clips and divided. Gallbladder was dissected from its bed with electrocautery, placed in a specimen bag and removed. The operative field was irrigated and inspected. There was no bleeding and no bile leak. Irrigation fluid was completely clear. We placed the omentum back up under the liver. Trocars were removed and there was no bleeding from the trocar sites. The pneumoperitoneum was released. The  fascia at the umbilicus was closed with 0 Vicryl sutures and skin incisions closed with subcuticular sutures of 4-0 Monocryl and Dermabond. The patient tolerated the procedure well was taken to PACU in stable condition. EBL 15 cc. Counts correct. Complications none.     Edsel Petrin. Dalbert Batman, M.D., FACS General and Minimally Invasive Surgery Breast and Colorectal Surgery  03/07/2014 10:24 AM

## 2014-03-07 NOTE — Anesthesia Preprocedure Evaluation (Addendum)
Anesthesia Evaluation   Patient awake    Reviewed: Allergy & Precautions, H&P , NPO status , Patient's Chart, lab work & pertinent test results  History of Anesthesia Complications Negative for: history of anesthetic complications  Airway Mallampati: II TM Distance: >3 FB     Dental   Pulmonary shortness of breath, sleep apnea , former smoker,    Pulmonary exam normal       Cardiovascular negative cardio ROS  Rhythm:Regular     Neuro/Psych  Headaches, Anxiety Depression    GI/Hepatic Neg liver ROS, GI history noted. CE   Endo/Other  negative endocrine ROS  Renal/GU negative Renal ROS     Musculoskeletal   Abdominal   Peds  Hematology negative hematology ROS (+)   Anesthesia Other Findings   Reproductive/Obstetrics                          Anesthesia Physical Anesthesia Plan  ASA: III  Anesthesia Plan: General   Post-op Pain Management:    Induction: Intravenous  Airway Management Planned: Oral ETT  Additional Equipment:   Intra-op Plan:   Post-operative Plan: Extubation in OR  Informed Consent: I have reviewed the patients History and Physical, chart, labs and discussed the procedure including the risks, benefits and alternatives for the proposed anesthesia with the patient or authorized representative who has indicated his/her understanding and acceptance.   Dental advisory given  Plan Discussed with: CRNA and Anesthesiologist  Anesthesia Plan Comments:         Anesthesia Quick Evaluation

## 2014-03-08 DIAGNOSIS — K8010 Calculus of gallbladder with chronic cholecystitis without obstruction: Secondary | ICD-10-CM | POA: Diagnosis not present

## 2014-03-08 MED ORDER — HYDROCODONE-ACETAMINOPHEN 5-325 MG PO TABS: 1.0000 | ORAL_TABLET | Freq: Four times a day (QID) | ORAL | Status: DC | PRN

## 2014-03-08 NOTE — Discharge Summary (Signed)
Patient ID: Brittany [name] [name] 58 y.o. 04-Sep-1955  Admit date: 03/07/2014  Discharge date and time: 03/08/2014  Admitting Physician: Adin Hector  Discharge Physician: Adin Hector  Admission Diagnoses: gallstones  Discharge Diagnoses: Chronic cholecystitis with cholelithiasis Sleep apnea Bipolar disorder Hyperlipidemia Status post robotic hysterectomy   Operations: Procedure(s): LAPAROSCOPIC CHOLECYSTECTOMY WITH INTRAOPERATIVE CHOLANGIOGRAM  Admission Condition: good  Discharged Condition: good  Indication for Admission: Brittany [name] is a 58 y.o. female. She is referred by Dr. Cristina Gong for evaluation and management of gallstones and abdominal pain.  She may have had some prior episodes of upper abdominal pain. Last month she had an episode of epigastric pain and dizziness but no nausea or vomiting. She went to the emergency room and they told her she had gallbladder sludge and possible stones and abnormal liver function tests. She saw Dr. Cristina Gong who put her on ursadiol on July 30. She has felt better since that time.  Looking back, she had an MRA. On 11/20/2013 which showed a simple renal cyst gallbladder looked normal. Dr. Janice Norrie has evaluated her  On 01/13/2014 she had an ultrasound which showed gallbladder sludge and possible stones otherwise negative.  She subsequently had liver function test which showed normal bilirubin, but elevation of alk phos and transaminases. Lipase borderline elevated at 69.  Endoscopic ultrasound by Dr. Paulita Fujita showed a normal pancreas, no mass, positive for gallstones but no inflammation. CBD normal. She was then referred to me. Over the past 7 days she's been having daily pain and nausea and we rescheduled for surgery sooner, to be done today.  Comorbidities include obstructive sleep apnea, hyperlipidemia, bipolar disorder. She has had a robotic TAH and BSO in [place].   Hospital Course: On the day of admission the patient was  taken to the operating room and underwent a laparoscopic cholecystectomy with cholangiogram. The gallbladder was clearly chronically inflamed. The cholangiogram was normal. No other abnormalities were noted. She was observed overnight because of her sleep apnea and she did very well. She progressed in her diet and activities without difficulty. The following morning she was ready to go home. She was tolerating diet. Voiding uneventfully. Examination revealed the abdomen was soft nontender nondistended all the wounds looked good. She was given instructions in diet and activities. She was given and a prescription for Norco for pain. She was requested to return to see me in 3 weeks.  Consults: None  Significant Diagnostic Studies: Intraoperative cholangiogram, surgical pathology, pending  Treatments: surgery: Laparoscopic cholecystectomy with cholangiogram  Disposition: Home  Patient Instructions:    Medication List         aspirin 325 MG tablet  Take 325 mg by mouth once.     azelastine 0.1 % nasal spray  Commonly known as:  ASTELIN  Place 1 spray into both nostrils at bedtime. Use in each nostril as directed     buPROPion 300 MG 24 hr tablet  Commonly known as:  WELLBUTRIN XL  Take 300 mg by mouth every morning.     cetirizine 10 MG tablet  Commonly known as:  ZYRTEC  Take 10 mg by mouth daily.     EQUETRO 300 MG Cp12  Generic drug:  Carbamazepine  Take 600 mg by mouth at bedtime.     HAIR/SKIN/NAILS PO  Take 1 tablet by mouth daily.     HYDROcodone-acetaminophen 5-325 MG per tablet  Commonly known as:  NORCO  Take 1-2 tablets by mouth every 6 (six) hours as needed for moderate pain  or severe pain.     HYDROcodone-acetaminophen 5-325 MG per tablet  Commonly known as:  NORCO  Take 1-2 tablets by mouth every 6 (six) hours as needed for moderate pain or severe pain.     ibuprofen 200 MG tablet  Commonly known as:  ADVIL,MOTRIN  Take 400 mg by mouth every 6 (six) hours as  needed for headache or moderate pain.     lamoTRIgine 150 MG tablet  Commonly known as:  LAMICTAL  Take 300 mg by mouth daily.     methocarbamol 500 MG tablet  Commonly known as:  ROBAXIN  Take 500 mg by mouth every 8 (eight) hours as needed for muscle spasms.     multivitamin with minerals Tabs tablet  Take 1 tablet by mouth daily.     omeprazole 40 MG capsule  Commonly known as:  PRILOSEC  Take 40 mg by mouth daily.     sertraline 50 MG tablet  Commonly known as:  ZOLOFT  Take 50 mg by mouth every morning.     ursodiol 300 MG capsule  Commonly known as:  ACTIGALL  Take 300 mg by mouth 2 (two) times daily.        Activity: as instructed. No sports or lifting for 3 weeks. Diet: low fat, low cholesterol diet Wound Care: none needed  Follow-up:  With Dr. Dalbert Batman in 3 weeks.  Signed: Edsel Petrin. Dalbert Batman, M.D., FACS General and minimally invasive surgery Breast and Colorectal Surgery  03/08/2014, 6:09 AM

## 2014-03-09 ENCOUNTER — Encounter (HOSPITAL_COMMUNITY): Payer: Self-pay | Admitting: General Surgery

## 2014-03-16 ENCOUNTER — Other Ambulatory Visit (INDEPENDENT_AMBULATORY_CARE_PROVIDER_SITE_OTHER): Payer: Self-pay | Admitting: Surgery

## 2014-03-17 DIAGNOSIS — F319 Bipolar disorder, unspecified: Secondary | ICD-10-CM | POA: Diagnosis not present

## 2014-03-18 DIAGNOSIS — M19049 Primary osteoarthritis, unspecified hand: Secondary | ICD-10-CM | POA: Diagnosis not present

## 2014-04-09 DIAGNOSIS — L309 Dermatitis, unspecified: Secondary | ICD-10-CM | POA: Diagnosis not present

## 2014-04-09 DIAGNOSIS — L709 Acne, unspecified: Secondary | ICD-10-CM | POA: Diagnosis not present

## 2014-04-10 ENCOUNTER — Other Ambulatory Visit (INDEPENDENT_AMBULATORY_CARE_PROVIDER_SITE_OTHER): Payer: Self-pay

## 2014-04-10 DIAGNOSIS — R7989 Other specified abnormal findings of blood chemistry: Secondary | ICD-10-CM

## 2014-04-10 DIAGNOSIS — R799 Abnormal finding of blood chemistry, unspecified: Secondary | ICD-10-CM | POA: Diagnosis not present

## 2014-04-10 DIAGNOSIS — R945 Abnormal results of liver function studies: Secondary | ICD-10-CM

## 2014-05-12 ENCOUNTER — Telehealth: Payer: Self-pay | Admitting: Family

## 2014-05-12 NOTE — Telephone Encounter (Signed)
Pt stated she had her gallbladder removed that was root cause of her symptoms from last visit with NP. Pt went to [place] and had blood work done oct 2015 and her liver alkaline phosphatase and total chole was elevated. Pt would like to have blood work repeat here. Pt stated [facility] not [facility]  actual results just a letter stating fact.

## 2014-05-12 NOTE — Telephone Encounter (Signed)
Pt has been sch

## 2014-05-12 NOTE — Telephone Encounter (Signed)
Please advise 

## 2014-05-12 NOTE — Telephone Encounter (Signed)
Needs OV.  

## 2014-05-14 ENCOUNTER — Other Ambulatory Visit: Payer: Self-pay | Admitting: Family

## 2014-05-26 DIAGNOSIS — R748 Abnormal levels of other serum enzymes: Secondary | ICD-10-CM | POA: Diagnosis not present

## 2014-05-26 DIAGNOSIS — R221 Localized swelling, mass and lump, neck: Secondary | ICD-10-CM | POA: Diagnosis not present

## 2014-05-29 ENCOUNTER — Ambulatory Visit: Payer: Medicare Other | Admitting: Family

## 2014-06-08 ENCOUNTER — Encounter: Payer: Self-pay | Admitting: Family

## 2014-06-25 DIAGNOSIS — M25561 Pain in right knee: Secondary | ICD-10-CM | POA: Diagnosis not present

## 2014-06-25 DIAGNOSIS — M1711 Unilateral primary osteoarthritis, right knee: Secondary | ICD-10-CM | POA: Diagnosis not present

## 2014-07-07 DIAGNOSIS — F3170 Bipolar disorder, currently in remission, most recent episode unspecified: Secondary | ICD-10-CM | POA: Diagnosis not present

## 2014-07-14 NOTE — Assessment & Plan Note (Signed)
Good compliance and control. Pressure of 11 is comfortable for her and she reports sleeping well. Plan-appointment with sleep center technician for CPAP mask fitting assistance

## 2014-07-15 DIAGNOSIS — M1711 Unilateral primary osteoarthritis, right knee: Secondary | ICD-10-CM | POA: Diagnosis not present

## 2014-07-24 ENCOUNTER — Ambulatory Visit (INDEPENDENT_AMBULATORY_CARE_PROVIDER_SITE_OTHER): Payer: Medicare Other | Admitting: Family

## 2014-07-24 ENCOUNTER — Encounter: Payer: Self-pay | Admitting: Family

## 2014-07-24 VITALS — BP 142/88 | Temp 98.5°F | Wt 194.0 lb

## 2014-07-24 DIAGNOSIS — L209 Atopic dermatitis, unspecified: Secondary | ICD-10-CM | POA: Diagnosis not present

## 2014-07-24 DIAGNOSIS — H903 Sensorineural hearing loss, bilateral: Secondary | ICD-10-CM

## 2014-07-24 DIAGNOSIS — H905 Unspecified sensorineural hearing loss: Secondary | ICD-10-CM

## 2014-07-24 MED ORDER — DERMA-SMOOTHE/FS BODY 0.01 % EX OIL: TOPICAL_OIL | CUTANEOUS | Status: DC

## 2014-07-24 NOTE — Progress Notes (Signed)
Subjective:    Patient ID: Brittany [name], female    DOB: 1955-12-26, 59 y.o.   MRN: [id number]  HPI 59 y.o. African American female presents today with a complaint of hearing loss for past 6 months.  She has noticed needing to increase the volume on TV and not hearing conversation as she did previously.  Pt also request corticosteroid ointment refill for atopic dermatitis.  She is normally treated for this condition by dermatology.   Review of Systems  Constitutional: Negative.   HENT: Positive for hearing loss.   Eyes: Negative.   Respiratory: Negative.   Cardiovascular: Negative.   Gastrointestinal: Negative.   Endocrine: Negative.   Genitourinary: Negative.   Musculoskeletal: Negative.   Skin: Negative.   Allergic/Immunologic: Negative.   Neurological: Negative.   Hematological: Negative.   Psychiatric/Behavioral: Negative.    Past Medical History  Diagnosis Date  . HYPERLIPIDEMIA   . HEMORRHOIDS-INTERNAL   . URI   . OBSTRUCTIVE SLEEP APNEA   . DISORDER, BIPOLAR NEC   . DEPRESSION   . ALOPECIA   . ALLERGIC RHINITIS   . ACNE NEC   . Anal fissure   . Arthritis   . Depression   . Bowel obstruction   . GERD (gastroesophageal reflux disease)   . Seasonal allergies   . Anxiety     Panic Attack.  None in a long time  . Constipation     drinks tea with senna  . Chronic headaches     migraine- none in years    History   Social History  . Marital Status: Married    Spouse Name: N/A    Number of Children: 4  . Years of Education: N/A   Occupational History  . disabled    Social History Main Topics  . Smoking status: Former Smoker -- 0.10 packs/day for 7 years    Types: Cigarettes    Quit date: 06/19/1981  . Smokeless tobacco: Never Used  . Alcohol Use: No     Comment: rare  . Drug Use: No  . Sexual Activity: Not on file   Other Topics Concern  . Not on file   Social History Narrative    Past Surgical History  Procedure Laterality Date  . Knee  surgery Bilateral     right x 2 left x 1.  arthroscopy  . Radical hysterectomy    . Tonsillectomy    . Breast lumpectomy Right last done 2005    x 2  . Thumb and pinkie finger surgery Right 2014  . Eus N/A 01/27/2014    Procedure: ESOPHAGEAL ENDOSCOPIC ULTRASOUND (EUS) RADIAL;  Surgeon: Arta Silence, MD;  Location: [facility];  Service: Endoscopy;  Laterality: N/A;  . Hand surgery Bilateral     for arthritis  left 02/02/14- right approx 2014  . Cholecystectomy N/A 03/07/2014    Procedure: LAPAROSCOPIC CHOLECYSTECTOMY WITH INTRAOPERATIVE CHOLANGIOGRAM;  Surgeon: Fanny Skates, MD;  Location: [facility] OR;  Service: General;  Laterality: N/A;    Family History  Problem Relation Age of Onset  . Breast cancer    . Lung cancer    . Colon cancer Maternal Uncle   . Colon polyps Maternal Uncle   . Other Paternal Aunt     x 2, blood clotting disorder  . Heart disease Sister   . Irritable bowel syndrome Cousin   . Kidney disease Maternal Uncle   . Esophageal cancer Paternal Uncle   . Rectal cancer Neg Hx   . Stomach cancer  Neg Hx     Allergies  Allergen Reactions  . Flexeril [Cyclobenzaprine]     Unable to leave home when taking as she is incoherent/unsure where she is at times  . Hydrocodone     REACTION: GI upset    Current Outpatient Prescriptions on File Prior to Visit  Medication Sig Dispense Refill  . aspirin 325 MG tablet Take 325 mg by mouth once.    Marland Kitchen azelastine (ASTELIN) 0.1 % nasal spray Place 1 spray into both nostrils at bedtime. Use in each nostril as directed    . buPROPion (WELLBUTRIN XL) 300 MG 24 hr tablet Take 300 mg by mouth every morning.    . Carbamazepine (EQUETRO) 300 MG CP12 Take 600 mg by mouth at bedtime.     . cetirizine (ZYRTEC) 10 MG tablet Take 10 mg by mouth daily.    Marland Kitchen HYDROcodone-acetaminophen (NORCO) 5-325 MG per tablet Take 1-2 tablets by mouth every 6 (six) hours as needed for moderate pain or severe pain. 30 tablet 0  . HYDROcodone-acetaminophen  (NORCO) 5-325 MG per tablet Take 1-2 tablets by mouth every 6 (six) hours as needed for moderate pain or severe pain. 30 tablet 0  . ibuprofen (ADVIL,MOTRIN) 200 MG tablet Take 400 mg by mouth every 6 (six) hours as needed for headache or moderate pain.     Marland Kitchen lamoTRIgine (LAMICTAL) 150 MG tablet Take 300 mg by mouth daily.     . methocarbamol (ROBAXIN) 500 MG tablet Take 500 mg by mouth every 8 (eight) hours as needed for muscle spasms.    . Multiple Vitamin (MULTIVITAMIN WITH MINERALS) TABS Take 1 tablet by mouth daily.    . Multiple Vitamins-Minerals (HAIR/SKIN/NAILS PO) Take 1 tablet by mouth daily.    Marland Kitchen omeprazole (PRILOSEC) 40 MG capsule TAKE 1 CAPSULE DAILY 90 capsule 1  . sertraline (ZOLOFT) 50 MG tablet Take 50 mg by mouth every morning.     . ursodiol (ACTIGALL) 300 MG capsule Take 300 mg by mouth 2 (two) times daily.     Current Facility-Administered Medications on File Prior to Visit  Medication Dose Route Frequency Provider Last Rate Last Dose  . methylPREDNISolone acetate (DEPO-MEDROL) injection 80 mg  80 mg Intra-articular Once Timoteo Gaul, FNP        BP 142/88 mmHg  Temp(Src) 98.5 F (36.9 C) (Oral)  Wt 194 lb (87.998 kg)chart    Objective:   Physical Exam  Constitutional: She appears well-developed and well-nourished.  HENT:  Head: Normocephalic.  Right Ear: External ear normal.  Left Ear: External ear normal.  Nose: Nose normal.  Mouth/Throat: Oropharynx is clear and moist.  Ears of clear of cerumen.  TM visible and pearly grey.  Light reflex visible.  Whisper test perform. Right ear diminished hearing.  Eyes: Conjunctivae and EOM are normal. Pupils are equal, round, and reactive to light.  Neck: Normal range of motion. Neck supple.  Cardiovascular: Normal rate, regular rhythm, normal heart sounds and intact distal pulses.   Pulmonary/Chest: Effort normal.  Abdominal: Soft. Bowel sounds are normal.  Musculoskeletal: Normal range of motion.            Assessment & Plan:   Syeda was seen today for ear fullness.  Diagnoses and associated orders for this visit:  Inner ear hearing loss - Ambulatory referral to Audiology  Atopic dermatitis  Other Orders - Fluocinolone Acetonide (DERMA-SMOOTHE/FS BODY) 0.01 % OIL; Apply to scalp every day. Leave on overnight or greater than 4 hours before washing  out.    Call the office with any questions or concerns.

## 2014-07-24 NOTE — Patient Instructions (Signed)
Hydrocortisone skin cream, ointment, lotion, or solution What is this medicine? HYDROCORTISONE (hye droe KOR ti sone) is a corticosteroid. It is used on the skin to reduce swelling, redness, itching, and allergic reactions. This medicine may be used for other purposes; ask your health care provider or pharmacist if you have questions. COMMON BRAND NAME(S): Ala-Cort, Ala-Scalp, Aqua Glycolic HC, Balneol for Her, Caldecort, Cetacort, Cortaid, Cortaid Advanced, Cortaid Intensive Therapy, Cortaid Sensitive Skin, CortAlo, Corticaine, Corticool, Cortizone, Cortizone-10, Cortizone-10 Cooling Relief, Cortizone-10 Intensive Healing, Cortizone-10 Plus, Dermarest Dricort, Dermarest Eczema, DERMASORB HC Complete, Gly-Cort, Hycort, Hydroskin, Hytone, Instacort, Lacticare HC, Locoid, Locoid Lipocream, Neosporin Eczema, NuCort, Nutracort, NuZon, Pandel, Pediaderm HC, Penecort, Preparation H Hydrocortisone, Rederm, Sarnol-HC, Scalacort, Scalpicin Anti-Itch, Texacort, Tucks HC, Westcort What should I tell my health care provider before I take this medicine? They need to know if you have any of these conditions: -any active infection -diabetes -large areas of burned or damaged skin -skin wasting or thinning -an unusual or allergic reaction to hydrocortisone, corticosteroids, sulfites, other medicines, foods, dyes, or preservatives -pregnant or trying to get pregnant -breast-feeding How should I use this medicine? This medicine is for external use only. Do not take by mouth. Follow the directions on the prescription label. Wash your hands before and after use. Apply a thin film of medicine to the affected area. Do not cover with a bandage or dressing unless your doctor or health care professional tells you to. Do not use on healthy skin or over large areas of skin. Do not get this medicine in your eyes. If you do, rinse out with plenty of cool tap water. Do not to use more medicine than prescribed. Do not use your  medicine more often than directed or for more than 14 days. Talk to your pediatrician regarding the use of this medicine in children. Special care may be needed. While this drug may be prescribed for children as young as 17 years of age for selected conditions, precautions do apply. Do not use this medicine for the treatment of diaper rash unless directed to do so by your doctor or health care professional. If applying this medicine to the diaper area of a child, do not cover with tight-fitting diapers or plastic pants. This may increase the amount of medicine that passes through the skin and increase the risk of serious side effects. Elderly patients are more likely to have damaged skin through aging, and this may increase side effects. This medicine should only be used for brief periods and infrequently in older patients. Overdosage: If you think you have taken too much of this medicine contact a poison control center or emergency room at once. NOTE: This medicine is only for you. Do not share this medicine with others. What if I miss a dose? If you miss a dose, use it as soon as you can. If it is almost time for your next dose, use only that dose. Do not use double or extra doses. What may interact with this medicine? Interactions are not expected. Do not use any other skin products on the affected area without asking your doctor or health care professional. This list may not describe all possible interactions. Give your health care provider a list of all the medicines, herbs, non-prescription drugs, or dietary supplements you use. Also tell them if you smoke, drink alcohol, or use illegal drugs. Some items may interact with your medicine. What should I watch for while using this medicine? Tell your doctor or health care professional if  your symptoms do not start to get better within 7 days or if they get worse. Tell your doctor or health care professional if you are exposed to anyone with measles or  chickenpox, or if you develop sores or blisters that do not heal properly. What side effects may I notice from receiving this medicine? Side effects that you should report to your doctor or health care professional as soon as possible: -allergic reactions like skin rash, itching or hives, swelling of the face, lips, or tongue -burning feeling on the skin -dark red spots on the skin -infection -lack of healing of skin condition -painful, red, pus filled blisters in hair follicles -thinning of the skin Side effects that usually do not require medical attention (report to your doctor or health care professional if they continue or are bothersome): -dry skin, irritation -unusual increased growth of hair on the face or body This list may not describe all possible side effects. Call your doctor for medical advice about side effects. You may report side effects to FDA at 1-800-FDA-1088. Where should I keep my medicine? Keep out of the reach of children. Store at room temperature between 15 and 30 degrees C (59 and 86 degrees F). Do not freeze. Throw away any unused medicine after the expiration date. NOTE: This sheet is a summary. It may not cover all possible information. If you have questions about this medicine, talk to your doctor, pharmacist, or health care provider.  2015, Elsevier/Gold Standard. (2007-10-18 16:08:48)

## 2014-07-24 NOTE — Progress Notes (Signed)
Pre visit review using our clinic review tool, if applicable. No additional management support is needed unless otherwise documented below in the visit note. 

## 2014-08-24 DIAGNOSIS — M1711 Unilateral primary osteoarthritis, right knee: Secondary | ICD-10-CM | POA: Diagnosis not present

## 2014-08-26 DIAGNOSIS — R748 Abnormal levels of other serum enzymes: Secondary | ICD-10-CM | POA: Diagnosis not present

## 2014-09-17 ENCOUNTER — Telehealth: Payer: Self-pay | Admitting: Family

## 2014-09-17 DIAGNOSIS — M5416 Radiculopathy, lumbar region: Secondary | ICD-10-CM

## 2014-09-17 NOTE — Telephone Encounter (Signed)
Referral placed.

## 2014-09-17 NOTE — Telephone Encounter (Signed)
Pt said she contacted Dr Tat office and they told her she need a referral   She said she need to fine a doctor here to treat her for her back pain. She said she is having a lot of back spasm   Reason; back pain , and back spasm

## 2014-09-28 ENCOUNTER — Encounter: Payer: Self-pay | Admitting: Family

## 2014-09-28 ENCOUNTER — Ambulatory Visit (INDEPENDENT_AMBULATORY_CARE_PROVIDER_SITE_OTHER): Payer: Medicare Other | Admitting: Family

## 2014-09-28 VITALS — BP 132/84 | HR 73 | Temp 98.2°F | Resp 18 | Ht 62.0 in | Wt 189.8 lb

## 2014-09-28 DIAGNOSIS — G8929 Other chronic pain: Secondary | ICD-10-CM

## 2014-09-28 DIAGNOSIS — M549 Dorsalgia, unspecified: Secondary | ICD-10-CM | POA: Diagnosis not present

## 2014-09-28 MED ORDER — TRAMADOL HCL 50 MG PO TABS: 50.0000 mg | ORAL_TABLET | Freq: Two times a day (BID) | ORAL | Status: DC | PRN

## 2014-09-28 MED ORDER — METHOCARBAMOL 500 MG PO TABS: 500.0000 mg | ORAL_TABLET | Freq: Three times a day (TID) | ORAL | Status: DC | PRN

## 2014-09-28 MED ORDER — DICLOFENAC SODIUM 1 % TD GEL: 2.0000 g | Freq: Four times a day (QID) | TRANSDERMAL | Status: DC

## 2014-09-28 NOTE — Assessment & Plan Note (Signed)
Patient with chronic back pain.review of previous MRI reveals mild-moderate disc bulge at 3 different lumbar levels. Patient was previously referred to neurosurgery. Discussed keeping that referral this time. Refer to pain clinic for further management of pain. Start tramadol as needed for pain. Refill methocarbamol as needed for muscle spasm. Follow-up pending neurosurgery consult.

## 2014-09-28 NOTE — Patient Instructions (Addendum)
Thank you for choosing [facility].  Summary/Instructions:  Your prescription(s) have been submitted to your pharmacy or been printed and provided for you. Please take as directed and contact our office if you believe you are having problem(s) with the medication(s) or have any questions.  Referrals have been made during this visit. You should expect to hear back from our schedulers in about 7-10 days in regards to establishing an appointment with the specialists we discussed.   If your symptoms worsen or fail to improve, please contact our office for further instruction, or in case of emergency go directly to the emergency room at the closest medical facility.

## 2014-09-28 NOTE — Progress Notes (Signed)
Subjective:    Patient ID: Brittany [name], female    DOB: [birth year], 57 y.o.   MRN: [id number]  Chief Complaint  Patient presents with  . Establish Care    needs more methocarmanol, was diagnosed with back issues, her back has gotten worse, now wearing a brace, has not been to see a back specialist in years, probably needs a referral    HPI:  Brittany [name] is a 59 y.o. female  who presents today to establish care with this provider and discuss her back pain.   1)  Chronic back pain - Associated symptom of pain located in her her lumbar spine has been going on for nearly 38 years. She has previously been evaluated and determined to have low back pain with radiculopathy into both lower extremities. Indicates she has degenerative disc disease and a bulging disc. She indicates that she has been seen by Neurosurgery about a year ago and was told there was no surgery that was needed at the time.  Pain is described as constant, crampy and sharp with radiculopathy to both her legs front and back. Intensity varies from 7-10/10. Currently she takes methocarbamol to assist with her spasms and ibuprofen which help a [place]. She also currently wears a lumbar spine orthosis brace to help support her back. She is doing water therapy at [facility] and is doing as many exercises as she can.  Allergies  Allergen Reactions  . Flexeril [Cyclobenzaprine]     Unable to leave home when taking as she is incoherent/unsure where she is at times  . Hydrocodone     REACTION: GI upset    Current Outpatient Prescriptions on File Prior to Visit  Medication Sig Dispense Refill  . aspirin 325 MG tablet Take 325 mg by mouth once.    Marland Kitchen azelastine (ASTELIN) 0.1 % nasal spray Place 1 spray into both nostrils at bedtime. Use in each nostril as directed    . buPROPion (WELLBUTRIN XL) 300 MG 24 hr tablet Take 300 mg by mouth every morning.    . Carbamazepine (EQUETRO) 300 MG CP12 Take 600 mg by mouth at bedtime.      . cetirizine (ZYRTEC) 10 MG tablet Take 10 mg by mouth daily.    . Fluocinolone Acetonide (DERMA-SMOOTHE/FS BODY) 0.01 % OIL Apply to scalp every day. Leave on overnight or greater than 4 hours before washing out. 1 Bottle 0  . HYDROcodone-acetaminophen (NORCO) 5-325 MG per tablet Take 1-2 tablets by mouth every 6 (six) hours as needed for moderate pain or severe pain. 30 tablet 0  . HYDROcodone-acetaminophen (NORCO) 5-325 MG per tablet Take 1-2 tablets by mouth every 6 (six) hours as needed for moderate pain or severe pain. 30 tablet 0  . ibuprofen (ADVIL,MOTRIN) 200 MG tablet Take 400 mg by mouth every 6 (six) hours as needed for headache or moderate pain.     Marland Kitchen lamoTRIgine (LAMICTAL) 150 MG tablet Take 300 mg by mouth daily.     . methocarbamol (ROBAXIN) 500 MG tablet Take 500 mg by mouth every 8 (eight) hours as needed for muscle spasms.    . Multiple Vitamin (MULTIVITAMIN WITH MINERALS) TABS Take 1 tablet by mouth daily.    . Multiple Vitamins-Minerals (HAIR/SKIN/NAILS PO) Take 1 tablet by mouth daily.    Marland Kitchen omeprazole (PRILOSEC) 40 MG capsule TAKE 1 CAPSULE DAILY 90 capsule 1  . sertraline (ZOLOFT) 50 MG tablet Take 50 mg by mouth every morning.     . ursodiol (  ACTIGALL) 300 MG capsule Take 300 mg by mouth 2 (two) times daily.     Current Facility-Administered Medications on File Prior to Visit  Medication Dose Route Frequency Provider Last Rate Last Dose  . methylPREDNISolone acetate (DEPO-MEDROL) injection 80 mg  80 mg Intra-articular Once Timoteo Gaul, FNP        Past Medical History  Diagnosis Date  . HYPERLIPIDEMIA   . HEMORRHOIDS-INTERNAL   . URI   . OBSTRUCTIVE SLEEP APNEA   . DISORDER, BIPOLAR NEC   . DEPRESSION   . ALOPECIA   . ALLERGIC RHINITIS   . ACNE NEC   . Anal fissure   . Arthritis   . Depression   . Bowel obstruction   . GERD (gastroesophageal reflux disease)   . Seasonal allergies   . Anxiety     Panic Attack.  None in a long time  . Constipation      drinks tea with senna  . Chronic headaches     migraine- none in years     Review of Systems  Respiratory: Negative for chest tightness and shortness of breath.   Musculoskeletal: Positive for back pain.  Neurological: Negative for numbness.      Objective:    BP 132/84 mmHg  Pulse 73  Temp(Src) 98.2 F (36.8 C) (Oral)  Resp 18  Ht 5\' 2"  (1.575 m)  Wt 189 lb 12.8 oz (86.093 kg)  BMI 34.71 kg/m2  SpO2 94% Nursing note and vital signs reviewed.  Physical Exam  Constitutional: She is oriented to person, place, and time. She appears well-developed and well-nourished. No distress.  Seated in the chair with a back brace on. Dressed appropriately for the situation and appears her stated age.   Cardiovascular: Normal rate, regular rhythm, normal heart sounds and intact distal pulses.   Pulmonary/Chest: Effort normal and breath sounds normal.  Musculoskeletal:  No obvious deformity, discoloration, or edema of lower back noted. Palpable tenderness along the lower thoracic and lumbar spine. Palpable tenderness also elicited along bilateral sciatic nerve pathways. Straight leg raises positive. Range of motion testing deferred secondary to pain. Distal pulses and sensation,  are intact and appropriate bilaterally. Reflexes are equal bilaterally.  Neurological: She is alert and oriented to person, place, and time.  Skin: Skin is warm and dry.  Psychiatric: She has a normal mood and affect. Her behavior is normal. Judgment and thought content normal.       Assessment & Plan:

## 2014-09-28 NOTE — Progress Notes (Signed)
Pre visit review using our clinic review tool, if applicable. No additional management support is needed unless otherwise documented below in the visit note. 

## 2014-10-06 DIAGNOSIS — H538 Other visual disturbances: Secondary | ICD-10-CM | POA: Diagnosis not present

## 2014-10-06 DIAGNOSIS — H269 Unspecified cataract: Secondary | ICD-10-CM | POA: Diagnosis not present

## 2014-10-07 ENCOUNTER — Other Ambulatory Visit: Payer: Self-pay | Admitting: Family

## 2014-10-13 ENCOUNTER — Encounter: Payer: Self-pay | Admitting: Podiatry

## 2014-10-13 ENCOUNTER — Ambulatory Visit: Payer: Medicare Other

## 2014-10-13 ENCOUNTER — Ambulatory Visit (INDEPENDENT_AMBULATORY_CARE_PROVIDER_SITE_OTHER): Payer: Medicare Other | Admitting: Podiatry

## 2014-10-13 VITALS — BP 111/69 | HR 74 | Temp 96.4°F | Resp 14

## 2014-10-13 DIAGNOSIS — M79671 Pain in right foot: Secondary | ICD-10-CM

## 2014-10-13 DIAGNOSIS — M79672 Pain in left foot: Secondary | ICD-10-CM

## 2014-10-13 DIAGNOSIS — M722 Plantar fascial fibromatosis: Secondary | ICD-10-CM

## 2014-10-13 MED ORDER — TRIAMCINOLONE ACETONIDE 10 MG/ML IJ SUSP
10.0000 mg | Freq: Once | INTRAMUSCULAR | Status: AC
  Administered 2014-10-13: 10 mg

## 2014-10-13 NOTE — Progress Notes (Signed)
Subjective:     Patient ID: Brittany [name], female   DOB: [birth year], 59 y.o.   MRN: [id number]  HPI patient states I'm having a lot of pain in my right heel and that it started in the outside of my right foot and I'm getting some pain in my left arch. Stated that the pain in the right heel is been present for at least 3 months   Review of Systems  All other systems reviewed and are negative.      Objective:   Physical Exam  Constitutional: She is oriented to person, place, and time.  Cardiovascular: Intact distal pulses.   Musculoskeletal: Normal range of motion.  Neurological: She is oriented to person, place, and time.  Skin: Skin is warm.  Nursing note and vitals reviewed.  neurovascular status found to be intact with muscle strength adequate and range of motion subtalar midtarsal joint within normal limits. Good digital perfusion is noted patient's well oriented 3 and does have mild equinus condition noted. Patient is found to have discomfort in the plantar heel right at the insertion of the tendon into the calcaneus mild discomfort in the lateral side of the right foot and in the left arch which is probably due to change in gait     Assessment:     Plantar fasciitis right acute in nature with orthotics which have lost her ability to hold the arch and mild compensatory tendinitis    Plan:     H&P and x-rays reviewed. Today I injected the plantar fascia right 3 mg Kenalog 5 mg Xylocaine and dispensed fascial brace with instructions on usage along with instructions on physical therapy and shoe gear modifications. Scanned for custom orthotics to replace the pair which are no longer holding her arch is up adequately

## 2014-10-13 NOTE — Progress Notes (Signed)
   Subjective:    Patient ID: Brittany [name], female    DOB: 05-Jun-1956, 59 y.o.   MRN: [id number]  HPI Right heel and arch is so painful that she has a problem going to bathroom at night, left foot occasionally burning,started a month ago, did not injure foot to her knowledge. She has been soaking in epsom salt , warm and then cold water, did not help.  "right heel and arch pain, left foot burning"   Review of Systems  Constitutional: Positive for fatigue and unexpected weight change.        Sweating   HENT: Positive for hearing loss.        Ringing in ears  Eyes: Positive for visual disturbance.  Respiratory: Positive for shortness of breath.   Musculoskeletal: Positive for back pain and gait problem.       Muscle pain, joint pain  Neurological: Positive for headaches.  Hematological: Bruises/bleeds easily.       Objective:   Physical Exam        Assessment & Plan:

## 2014-10-13 NOTE — Patient Instructions (Signed)

## 2014-10-14 ENCOUNTER — Ambulatory Visit: Payer: Medicare Other | Admitting: Family

## 2014-10-26 DIAGNOSIS — M1711 Unilateral primary osteoarthritis, right knee: Secondary | ICD-10-CM | POA: Diagnosis not present

## 2014-10-27 DIAGNOSIS — F3170 Bipolar disorder, currently in remission, most recent episode unspecified: Secondary | ICD-10-CM | POA: Diagnosis not present

## 2014-10-29 ENCOUNTER — Ambulatory Visit: Payer: Medicare Other | Admitting: Podiatry

## 2014-10-30 ENCOUNTER — Ambulatory Visit: Payer: Medicare Other | Admitting: Podiatry

## 2014-11-03 ENCOUNTER — Ambulatory Visit (INDEPENDENT_AMBULATORY_CARE_PROVIDER_SITE_OTHER): Payer: Medicare Other | Admitting: Podiatry

## 2014-11-03 DIAGNOSIS — M722 Plantar fascial fibromatosis: Secondary | ICD-10-CM

## 2014-11-03 DIAGNOSIS — M79671 Pain in right foot: Secondary | ICD-10-CM | POA: Diagnosis not present

## 2014-11-03 NOTE — Progress Notes (Signed)
She presents today with a chief complaint of a painful right heel. She states that is centrally located on the plantar aspect of the right heel. States that she would like to have her orthotics today as well as possibly get an injection. She states this night time and first thing in the mornings seems to be her worst.  Objective: Vital signs are stable she is alert and oriented 3. Pulses are palpable bilateral. No Pain. She has pain on direct palpation of the antral lateral calcaneal tubercle right heel.  Assessment: Chronic proximal plantar fasciitis right.  Plan: Discussed etiology and pathology conservative versus surgical therapies. Discussed orthotics and dispense them today. I also injected Kenalog 20 mg to the medial calcaneal tubercle right. And dispensed a night splint. She will follow up with Dr. Jeris Penta in a month.

## 2014-11-03 NOTE — Patient Instructions (Signed)
WEARING INSTRUCTIONS FOR ORTHOTICS  Don't expect to be comfortable wearing your orthotic devices for the first time.  Like eyeglasses, you may be aware of them as time passes, they will not be uncomfortable and you will enjoy wearing them.  FOLLOW THESE INSTRUCTIONS EXACTLY!  1. Wear your orthotic devices for:       Not more than 1 hour the first day.       Not more than 2 hours the second day.       Not more than 3 hours the third day and so on.        Or wear them for as long as they feel comfortable.       If you experience discomfort in your feet or legs take them out.  When feet & legs feel       better, put them back in.  You do need to be consistent and wear them a [place]        everyday. 2.   If at any time the orthotic devices become acutely uncomfortable before the       time for that particular day, STOP WEARING THEM. 3.   On the next day, do not increase the wearing time. 4.   Subsequently, increase the wearing time by 15-30 minutes only if comfortable to do       so. 5.   You will be seen by your doctor about 2-4 weeks after you receive your orthotic       devices, at which time you will probably be wearing your devices comfortably        for about 8 hours or more a day. 6.   Some patients occasionally report mild aches or discomfort in other parts of the of       body such as the knees, hips or back after 3 or 4 consecutive hours of wear.  If this       is the case with you, do not extend your wearing time.  Instead, cut it back an hour or       two.  In all likelihood, these symptoms will disappear in a short period of time as your       body posture realigns itself and functions more efficiently. 7.   It is possible that your orthotic device may require some small changes or adjustment       to improve their function or make them more comfortable.   This is usually not done       before one to three months have elapsed.  These adjustments are made in        accordance  with the changed position your feet are assuming as a result of       improved biomechanical function. 8.   In women's shoes, it's not unusual for your heel to slip out of the shoe, particularly if       they are step-in-shoes.  If this is the case, try other shoes or other styles.  Try to       purchase shoes which have deeper heal seats or higher heel counters. 9.   Squeaking of orthotics devices in the shoes is due to the movement of the devices       when they are functioning normally.  To eliminate squeaking, simply dust some       baby powder into your shoes before inserting the devices.  If this does not work,          apply soap or wax to the edges of the orthotic devices or put a tissue into the shoes. 10. It is important that you follow these directions explicitly.  Failure to do so will simply       prolong the adjustment period or create problems which are easily avoided.  It makes       no difference if you are wearing your orthotic devices for only a few hours after        several months, so long as you are wearing them comfortably for those hours. 11. If you have any questions or complaints, contact our office.  We have no way of       knowing about your problems unless you tell us.  If we do not hear from you, we will       assume that you are proceeding well.  

## 2014-11-26 ENCOUNTER — Encounter: Payer: Self-pay | Admitting: Podiatry

## 2014-11-26 ENCOUNTER — Ambulatory Visit (INDEPENDENT_AMBULATORY_CARE_PROVIDER_SITE_OTHER): Payer: Medicare Other | Admitting: Podiatry

## 2014-11-26 DIAGNOSIS — M722 Plantar fascial fibromatosis: Secondary | ICD-10-CM | POA: Diagnosis not present

## 2014-11-26 MED ORDER — MELOXICAM 15 MG PO TABS: 15.0000 mg | ORAL_TABLET | Freq: Every day | ORAL | Status: DC

## 2014-11-27 NOTE — Progress Notes (Signed)
She presents today for follow-up of her plantar fasciitis.. She states that the orthotics are not quite the same as the ones that I had made previously and he still hurt.  Objective: Pain on palpation medial calcaneal tubercle bilateral.  Assessment: Chronic intractable plantar fasciitis bilateral.  Plan: I encouraged her to continue wearing her orthotics on a regular basis and wrote a prescription for meloxicam.

## 2014-12-28 DIAGNOSIS — M76822 Posterior tibial tendinitis, left leg: Secondary | ICD-10-CM | POA: Diagnosis not present

## 2014-12-28 DIAGNOSIS — M71571 Other bursitis, not elsewhere classified, right ankle and foot: Secondary | ICD-10-CM | POA: Diagnosis not present

## 2014-12-28 DIAGNOSIS — M76821 Posterior tibial tendinitis, right leg: Secondary | ICD-10-CM | POA: Diagnosis not present

## 2014-12-28 DIAGNOSIS — M71572 Other bursitis, not elsewhere classified, left ankle and foot: Secondary | ICD-10-CM | POA: Diagnosis not present

## 2014-12-28 DIAGNOSIS — M722 Plantar fascial fibromatosis: Secondary | ICD-10-CM | POA: Diagnosis not present

## 2015-01-04 DIAGNOSIS — M722 Plantar fascial fibromatosis: Secondary | ICD-10-CM | POA: Diagnosis not present

## 2015-01-04 DIAGNOSIS — M71571 Other bursitis, not elsewhere classified, right ankle and foot: Secondary | ICD-10-CM | POA: Diagnosis not present

## 2015-01-04 DIAGNOSIS — M71572 Other bursitis, not elsewhere classified, left ankle and foot: Secondary | ICD-10-CM | POA: Diagnosis not present

## 2015-01-05 DIAGNOSIS — Z1231 Encounter for screening mammogram for malignant neoplasm of breast: Secondary | ICD-10-CM | POA: Diagnosis not present

## 2015-01-05 DIAGNOSIS — Z803 Family history of malignant neoplasm of breast: Secondary | ICD-10-CM | POA: Diagnosis not present

## 2015-01-07 ENCOUNTER — Ambulatory Visit: Payer: Medicare Other | Admitting: Podiatry

## 2015-01-07 ENCOUNTER — Other Ambulatory Visit: Payer: Self-pay | Admitting: Family

## 2015-01-08 DIAGNOSIS — R7989 Other specified abnormal findings of blood chemistry: Secondary | ICD-10-CM | POA: Diagnosis not present

## 2015-01-08 DIAGNOSIS — R748 Abnormal levels of other serum enzymes: Secondary | ICD-10-CM | POA: Diagnosis not present

## 2015-01-11 DIAGNOSIS — R740 Nonspecific elevation of levels of transaminase and lactic acid dehydrogenase [LDH]: Secondary | ICD-10-CM | POA: Diagnosis not present

## 2015-01-14 DIAGNOSIS — M1711 Unilateral primary osteoarthritis, right knee: Secondary | ICD-10-CM | POA: Diagnosis not present

## 2015-01-27 ENCOUNTER — Ambulatory Visit (INDEPENDENT_AMBULATORY_CARE_PROVIDER_SITE_OTHER): Payer: Medicare Other | Admitting: Internal Medicine

## 2015-01-27 ENCOUNTER — Encounter: Payer: Self-pay | Admitting: Internal Medicine

## 2015-01-27 VITALS — BP 110/80 | HR 73 | Ht 62.0 in | Wt 192.6 lb

## 2015-01-27 DIAGNOSIS — M549 Dorsalgia, unspecified: Secondary | ICD-10-CM | POA: Diagnosis not present

## 2015-01-27 DIAGNOSIS — G4733 Obstructive sleep apnea (adult) (pediatric): Secondary | ICD-10-CM

## 2015-01-27 DIAGNOSIS — G8929 Other chronic pain: Secondary | ICD-10-CM

## 2015-01-27 MED ORDER — AZELASTINE HCL 0.1 % NA SOLN: 1.0000 | Freq: Every day | NASAL | Status: DC

## 2015-01-27 NOTE — Assessment & Plan Note (Signed)
She is describing good compliance and control. We need to get her to machines checked and compared. She thinks one is blowing stronger than the other although? Set the same. She prefers the one that blows a Regis stronger.

## 2015-01-27 NOTE — Assessment & Plan Note (Signed)
Chronic pain and the effects of her associated medications leave her somewhat tired during the day. I don't think this is a CPAP failure.

## 2015-01-27 NOTE — Progress Notes (Signed)
08/19/12- 68 yoF former smoker seeking to re-establish for hx OSA complicated by BiPolar/depression. Former patient-last seen 2010. Was using CAP 8/ SMS with good compliance and control. She lost weight, stopped snoring and dropped off CPAP then. Says she couldn't deal with water gurgling in the hose. Since then has regained weight, snoring loudly so husband leaves bedroom. Daytime sleepiness if sitting quietly. New problem- perennial rhinitis, intense around newly mowed grass and house dust. Never tested. Notes wheezing when she lies down. No hx ENT surgery or asthma. Smoked 1 ppw x 7 years. Family hx of COPD, food allergies. Bedtime 12MN-3AM, latency 2-3 hours, waking 8-10 x/ night before up 7-9 AM.  NPSG 08/25/08- AHI 10.8/ hr, weighed 190 lbs.  09/19/12- 28 yoF former smoker seeking to re-establish for hx OSA complicated by BiPolar/depression FOLLOWS FOR: review sleep study with patient. Has difficulty sleeping away from her familiar bedroom .NPSG 09/04/12- AHI 10.1 per hour, loud snoring, weight 190 pounds. Mild obstructive sleep apnea.  10/02/13- 48 yoF former smoker followed for OSA complicated by BiPolar/depression FOLLOWS FOR: review sleep study with patient. CPAP 11/ APS.compliance and control has been good. Can't sleep without it. Perennial nasal stuffiness. Occasional wheeze when she laughs  01/26/14- 57 yoF former smoker followed for OSA complicated by BiPolar/depression FOLLOWS FOR: wears cpap 11/ APS at least 5 hours nightly, also wears it when napping.  States her mask is making her nose bigger, but has no complaints with mask/supplies/pressure.  Got new CPAP machine after last office visit. She thinks the nasal pillows mask is dilating her nostrils. We talked about getting it refitted by her home care company. She sleeps well and says she cannot sleep without CPAP now.  01/27/15- 57 yoF former smoker followed for OSA complicated by BiPolar/depression cpap 11/ APS Follows For: Wears CPAP  at least 8 hrs nightly, also wears while taking a nap. Not feeling rested but does not feel like it's due to CPAP back pain wakes her up throughout the night.  We reviewed her medications and potential impact on sleep. She does nap when needed. CPAP was well tolerated Has to CPAP machines. She thinks both her set on 11 but the one she paid for out of pocket seems a Basaldua stronger and she likes that better. She uses it when she sleeps in a recliner downstairs. We will get DME to check pressure on both machines.  ROS-see HPI Constitutional:   No-   weight loss, night sweats, fevers, chills, +fatigue, lassitude. HEENT:   No-  headaches, difficulty swallowing, tooth/dental problems, sore throat,       No- sneezing, itching, ear ache, +nasal congestion, +post nasal drip,  CV:  No-   chest pain, orthopnea, PND, swelling in lower extremities, anasarca,                                                          dizziness, palpitations Resp: No-   shortness of breath with exertion or at rest.              No-   productive cough,  No non-productive cough,  No- coughing up of blood.              No-   change in color of mucus.   wheezing.   Skin: No-  rash or lesions. GI:  No-   heartburn, indigestion, abdominal pain, nausea, vomiting, GU: . MS:  + joint pain or swelling. + Back pain Neuro-     nothing unusual Psych:  No- change in mood or affect. + depression or anxiety.  No memory loss.  OBJ- Physical Exam General- Alert, Oriented, Affect-appropriate, Distress- none acute, overweight Skin- rash-none, lesions- none, excoriation- none Lymphadenopathy- none Head- atraumatic            Eyes- Gross vision intact, PERRLA, conjunctivae and secretions clear            Ears- Hearing, canals-normal            Nose-  +pale turbinate ededma, no-Septal dev, mucus, polyps, erosion, perforation             Throat- Mallampati III , mucosa clear , drainage- none, tonsils- atrophic Neck- flexible , trachea midline,  no stridor , thyroid nl, carotid no bruit Chest - symmetrical excursion , unlabored           Heart/CV- RRR , no murmur , no gallop  , no rub, nl s1 s2                           - JVD- none , edema- none, stasis changes- none, varices- none           Lung- clear to P&A, wheeze- none, cough- none , dullness-none, rub- none           Chest wall-  Abd-  Br/ Gen/ Rectal- Not done, not indicated Extrem- cyanosis- none, clubbing, none, atrophy- none, strength- nl. Cane Neuro- grossly intact to observation

## 2015-01-27 NOTE — Patient Instructions (Signed)
Order- DME [facility]PS- patient has two CPAP machines. Please check both for current pressure settings. One seems to work better.   Script sent refilling astelin nasal spray

## 2015-02-01 DIAGNOSIS — M7062 Trochanteric bursitis, left hip: Secondary | ICD-10-CM | POA: Diagnosis not present

## 2015-02-01 DIAGNOSIS — M545 Low back pain: Secondary | ICD-10-CM | POA: Diagnosis not present

## 2015-02-03 ENCOUNTER — Telehealth: Payer: Self-pay | Admitting: Family

## 2015-02-03 NOTE — Telephone Encounter (Signed)
Pt called in and said that she got a letter in the mail from [facility] that she as dince breast tissue and she need to get with primary for further treatment?     Best number [phone] ok to leave message

## 2015-02-04 ENCOUNTER — Encounter: Payer: Self-pay | Admitting: Physical Medicine & Rehabilitation

## 2015-02-05 NOTE — Telephone Encounter (Signed)
I am not sure that there is anything that needs to be done with this.

## 2015-02-05 NOTE — Telephone Encounter (Signed)
Unable to leave message for pt.

## 2015-02-08 ENCOUNTER — Ambulatory Visit (INDEPENDENT_AMBULATORY_CARE_PROVIDER_SITE_OTHER): Payer: Medicare Other | Admitting: Podiatry

## 2015-02-08 ENCOUNTER — Encounter: Payer: Self-pay | Admitting: Podiatry

## 2015-02-08 VITALS — BP 106/67 | HR 68 | Resp 16

## 2015-02-08 DIAGNOSIS — M722 Plantar fascial fibromatosis: Secondary | ICD-10-CM | POA: Diagnosis not present

## 2015-02-08 MED ORDER — OXYCODONE-ACETAMINOPHEN 10-325 MG PO TABS: 1.0000 | ORAL_TABLET | Freq: Three times a day (TID) | ORAL | Status: DC | PRN

## 2015-02-08 MED ORDER — DIAZEPAM 10 MG PO TABS: 10.0000 mg | ORAL_TABLET | Freq: Every evening | ORAL | Status: DC | PRN

## 2015-02-09 NOTE — Progress Notes (Signed)
Subjective:     Patient ID: Brittany [name], female   DOB: 07/23/1955, 59 y.o.   MRN: [id number]  HPI patient presents stating she still has a lot of pain in the bottom of her right heel and the orthotics are not currently making it better   Review of Systems     Objective:   Physical Exam Neurovascular status intact muscle strength was found to be adequate and patient's noted to have discomfort continuing in the plantar aspect of the right heel at the insertional point the tendon into the calcaneus with fluid buildup noted    Assessment:     Continued chronic plantar fasciitis right heel    Plan:     Advised patient on continuation of anti-inflammatory physical therapy and I've recommended shockwave therapy before we consider surgery. I went ahead and I explained what we would do and she wants this procedure and today I dispensed air fracture walker with advice on proper usage and to wear it prior to surgery to help calm the fascia down before we initiate shockwave

## 2015-02-10 ENCOUNTER — Ambulatory Visit (INDEPENDENT_AMBULATORY_CARE_PROVIDER_SITE_OTHER): Payer: Medicare Other | Admitting: Family

## 2015-02-10 ENCOUNTER — Encounter: Payer: Self-pay | Admitting: Family

## 2015-02-10 VITALS — BP 120/84 | HR 65 | Temp 98.4°F | Resp 18 | Ht 62.0 in | Wt 187.0 lb

## 2015-02-10 DIAGNOSIS — Z0000 Encounter for general adult medical examination without abnormal findings: Secondary | ICD-10-CM | POA: Insufficient documentation

## 2015-02-10 DIAGNOSIS — F329 Major depressive disorder, single episode, unspecified: Secondary | ICD-10-CM

## 2015-02-10 DIAGNOSIS — E785 Hyperlipidemia, unspecified: Secondary | ICD-10-CM

## 2015-02-10 DIAGNOSIS — Z113 Encounter for screening for infections with a predominantly sexual mode of transmission: Secondary | ICD-10-CM

## 2015-02-10 DIAGNOSIS — F32A Depression, unspecified: Secondary | ICD-10-CM

## 2015-02-10 DIAGNOSIS — R634 Abnormal weight loss: Secondary | ICD-10-CM

## 2015-02-10 DIAGNOSIS — E559 Vitamin D deficiency, unspecified: Secondary | ICD-10-CM

## 2015-02-10 DIAGNOSIS — Z9189 Other specified personal risk factors, not elsewhere classified: Secondary | ICD-10-CM

## 2015-02-10 NOTE — Patient Instructions (Addendum)
Thank you for choosing [facility].  Summary/Instructions:  Please stop by the lab on the basement level of the building for your blood work. Your results will be released to [place] (or called to you) after review, usually within 72 hours after test completion. If any changes need to be made, you will be notified at that same time.  If your symptoms worsen or fail to improve, please contact our office for further instruction, or in case of emergency go directly to the emergency room at the closest medical facility.    Health Maintenance  Topic Date Due  . Hepatitis C Screening  [birth year]  . HIV Screening  05/02/1972  . INFLUENZA VACCINE  01/18/2016 (Originally 01/18/2015)  . MAMMOGRAM  11/14/2015  . COLONOSCOPY  10/02/2021  . TETANUS/TDAP  01/23/2022    Health Maintenance Adopting a healthy lifestyle and getting preventive care can go a long way to promote health and wellness. Talk with your health care provider about what schedule of regular examinations is right for you. This is a good chance for you to check in with your provider about disease prevention and staying healthy. In between checkups, there are plenty of things you can do on your own. Experts have done a lot of research about which lifestyle changes and preventive measures are most likely to keep you healthy. Ask your health care provider for more information. WEIGHT AND DIET  Eat a healthy diet  Be sure to include plenty of vegetables, fruits, low-fat dairy products, and lean protein.  Do not eat a lot of foods high in solid fats, added sugars, or salt.  Get regular exercise. This is one of the most important things you can do for your health.  Most adults should exercise for at least 150 minutes each week. The exercise should increase your heart rate and make you sweat (moderate-intensity exercise).  Most adults should also do strengthening exercises at least twice a week. This is in addition to the  moderate-intensity exercise.  Maintain a healthy weight  Body mass index (BMI) is a measurement that can be used to identify possible weight problems. It estimates body fat based on height and weight. Your health care provider can help determine your BMI and help you achieve or maintain a healthy weight.  For females 82 years of age and older:   A BMI below 18.5 is considered underweight.  A BMI of 18.5 to 24.9 is normal.  A BMI of 25 to 29.9 is considered overweight.  A BMI of 30 and above is considered obese.  Watch levels of cholesterol and blood lipids  You should start having your blood tested for lipids and cholesterol at 58 years of age, then have this test every 5 years.  You may need to have your cholesterol levels checked more often if:  Your lipid or cholesterol levels are high.  You are older than 58 years of age.  You are at high risk for heart disease.  CANCER SCREENING   Lung Cancer  Lung cancer screening is recommended for adults 58-33 years old who are at high risk for lung cancer because of a history of smoking.  A yearly low-dose CT scan of the lungs is recommended for people who:  Currently smoke.  Have quit within the past 15 years.  Have at least a 30-pack-year history of smoking. A pack year is smoking an average of one pack of cigarettes a day for 1 year.  Yearly screening should continue until it has  been 15 years since you quit.  Yearly screening should stop if you develop a health problem that would prevent you from having lung cancer treatment.  Breast Cancer  Practice breast self-awareness. This means understanding how your breasts normally appear and feel.  It also means doing regular breast self-exams. Let your health care provider know about any changes, no matter how small.  If you are in your 20s or 30s, you should have a clinical breast exam (CBE) by a health care provider every 1-3 years as part of a regular health exam.  If  you are 22 or older, have a CBE every year. Also consider having a breast X-ray (mammogram) every year.  If you have a family history of breast cancer, talk to your health care provider about genetic screening.  If you are at high risk for breast cancer, talk to your health care provider about having an MRI and a mammogram every year.  Breast cancer gene (BRCA) assessment is recommended for women who have family members with BRCA-related cancers. BRCA-related cancers include:  Breast.  Ovarian.  Tubal.  Peritoneal cancers.  Results of the assessment will determine the need for genetic counseling and BRCA1 and BRCA2 testing. Cervical Cancer Routine pelvic examinations to screen for cervical cancer are no longer recommended for nonpregnant women who are considered low risk for cancer of the pelvic organs (ovaries, uterus, and vagina) and who do not have symptoms. A pelvic examination may be necessary if you have symptoms including those associated with pelvic infections. Ask your health care provider if a screening pelvic exam is right for you.   The Pap test is the screening test for cervical cancer for women who are considered at risk.  If you had a hysterectomy for a problem that was not cancer or a condition that could lead to cancer, then you no longer need Pap tests.  If you are older than 65 years, and you have had normal Pap tests for the past 10 years, you no longer need to have Pap tests.  If you have had past treatment for cervical cancer or a condition that could lead to cancer, you need Pap tests and screening for cancer for at least 20 years after your treatment.  If you no longer get a Pap test, assess your risk factors if they change (such as having a new sexual partner). This can affect whether you should start being screened again.  Some women have medical problems that increase their chance of getting cervical cancer. If this is the case for you, your health care  provider may recommend more frequent screening and Pap tests.  The human papillomavirus (HPV) test is another test that may be used for cervical cancer screening. The HPV test looks for the virus that can cause cell changes in the cervix. The cells collected during the Pap test can be tested for HPV.  The HPV test can be used to screen women 16 years of age and older. Getting tested for HPV can extend the interval between normal Pap tests from three to five years.  An HPV test also should be used to screen women of any age who have unclear Pap test results.  After 58 years of age, women should have HPV testing as often as Pap tests.  Colorectal Cancer  This type of cancer can be detected and often prevented.  Routine colorectal cancer screening usually begins at 58 years of age and continues through 58 years of age.  Your  health care provider may recommend screening at an earlier age if you have risk factors for colon cancer.  Your health care provider may also recommend using home test kits to check for hidden blood in the stool.  A small camera at the end of a tube can be used to examine your colon directly (sigmoidoscopy or colonoscopy). This is done to check for the earliest forms of colorectal cancer.  Routine screening usually begins at age 5.  Direct examination of the colon should be repeated every 5-10 years through 58 years of age. However, you may need to be screened more often if early forms of precancerous polyps or small growths are found. Skin Cancer  Check your skin from head to toe regularly.  Tell your health care provider about any new moles or changes in moles, especially if there is a change in a mole's shape or color.  Also tell your health care provider if you have a mole that is larger than the size of a pencil eraser.  Always use sunscreen. Apply sunscreen liberally and repeatedly throughout the day.  Protect yourself by wearing long sleeves, pants, a  wide-brimmed hat, and sunglasses whenever you are outside. HEART DISEASE, DIABETES, AND HIGH BLOOD PRESSURE   Have your blood pressure checked at least every 1-2 years. High blood pressure causes heart disease and increases the risk of stroke.  If you are between 25 years and 55 years old, ask your health care provider if you should take aspirin to prevent strokes.  Have regular diabetes screenings. This involves taking a blood sample to check your fasting blood sugar level.  If you are at a normal weight and have a low risk for diabetes, have this test once every three years after 57 years of age.  If you are overweight and have a high risk for diabetes, consider being tested at a younger age or more often. PREVENTING INFECTION  Hepatitis B  If you have a higher risk for hepatitis B, you should be screened for this virus. You are considered at high risk for hepatitis B if:  You were born in a country where hepatitis B is common. Ask your health care provider which countries are considered high risk.  Your parents were born in a high-risk country, and you have not been immunized against hepatitis B (hepatitis B vaccine).  You have HIV or AIDS.  You use needles to inject street drugs.  You live with someone who has hepatitis B.  You have had sex with someone who has hepatitis B.  You get hemodialysis treatment.  You take certain medicines for conditions, including cancer, organ transplantation, and autoimmune conditions. Hepatitis C  Blood testing is recommended for:  Everyone born from [birth year] through 1964.  Anyone with known risk factors for hepatitis C. Sexually transmitted infections (STIs)  You should be screened for sexually transmitted infections (STIs) including gonorrhea and chlamydia if:  You are sexually active and are younger than 58 years of age.  You are older than 58 years of age and your health care provider tells you that you are at risk for this type of  infection.  Your sexual activity has changed since you were last screened and you are at an increased risk for chlamydia or gonorrhea. Ask your health care provider if you are at risk.  If you do not have HIV, but are at risk, it may be recommended that you take a prescription medicine daily to prevent HIV infection. This is called  pre-exposure prophylaxis (PrEP). You are considered at risk if:  You are sexually active and do not regularly use condoms or know the HIV status of your partner(s).  You take drugs by injection.  You are sexually active with a partner who has HIV. Talk with your health care provider about whether you are at high risk of being infected with HIV. If you choose to begin PrEP, you should first be tested for HIV. You should then be tested every 3 months for as long as you are taking PrEP.  PREGNANCY   If you are premenopausal and you may become pregnant, ask your health care provider about preconception counseling.  If you may become pregnant, take 400 to 800 micrograms (mcg) of folic acid every day.  If you want to prevent pregnancy, talk to your health care provider about birth control (contraception). OSTEOPOROSIS AND MENOPAUSE   Osteoporosis is a disease in which the bones lose minerals and strength with aging. This can result in serious bone fractures. Your risk for osteoporosis can be identified using a bone density scan.  If you are 63 years of age or older, or if you are at risk for osteoporosis and fractures, ask your health care provider if you should be screened.  Ask your health care provider whether you should take a calcium or vitamin D supplement to lower your risk for osteoporosis.  Menopause may have certain physical symptoms and risks.  Hormone replacement therapy may reduce some of these symptoms and risks. Talk to your health care provider about whether hormone replacement therapy is right for you.  HOME CARE INSTRUCTIONS   Schedule regular  health, dental, and eye exams.  Stay current with your immunizations.   Do not use any tobacco products including cigarettes, chewing tobacco, or electronic cigarettes.  If you are pregnant, do not drink alcohol.  If you are breastfeeding, limit how much and how often you drink alcohol.  Limit alcohol intake to no more than 1 drink per day for nonpregnant women. One drink equals 12 ounces of beer, 5 ounces of wine, or 1 ounces of hard liquor.  Do not use street drugs.  Do not share needles.  Ask your health care provider for help if you need support or information about quitting drugs.  Tell your health care provider if you often feel depressed.  Tell your health care provider if you have ever been abused or do not feel safe at home. Document Released: 12/19/2010 Document Revised: 10/20/2013 Document Reviewed: 05/07/2013 [facility] Patient Information 2015 [place], [place]. This information is not intended to replace advice given to you by your health care provider. Make sure you discuss any questions you have with your health care provider.

## 2015-02-10 NOTE — Progress Notes (Signed)
Subjective:    Patient ID: Brittany [name], female    DOB: [birth year], 59 y.o.   MRN: [id number]  Chief Complaint  Patient presents with  . Follow-up    blood work to test HIV/Aids, Wants full blood work panel done, HPV testing, referral to MRI for breast     HPI:  Brittany [name] is a 59 y.o. female who presents today for an annual wellness visit.   1) Health Maintenance -   Diet - Averages about 2-3 meals per day consisting fruits, vegetables, chicken, deer, fish; Caffeine 1 cup daily  Exercise - No current exercise regimen - reports difficulty secondary to her back  2) Preventative Exams / Immunizations:  Dental -- Up to date  Vision -- Up to date   Health Maintenance  Topic Date Due  . Hepatitis C Screening  01/10/57  . HIV Screening  05/02/1972  . INFLUENZA VACCINE  01/18/2015  . MAMMOGRAM  11/14/2015  . COLONOSCOPY  10/02/2021  . TETANUS/TDAP  01/23/2022    Immunization History  Administered Date(s) Administered  . Tdap 01/24/2012    RISK FACTORS  Tobacco History  Smoking status  . Former Smoker -- 0.10 packs/day for 7 years  . Types: Cigarettes  . Quit date: 06/19/1981  Smokeless tobacco  . Never Used     Cardiac risk factors: obesity (BMI >= 30 kg/m2) and sedentary lifestyle.  Depression Screen  Q1: Over the past two weeks, have you felt down, depressed or hopeless? No  Q2: Over the past two weeks, have you felt [place] interest or pleasure in doing things? No  Have you lost interest or pleasure in daily life? No  Do you often feel hopeless? No  Do you cry easily over simple problems? No  Activities of Daily Living In your present state of health, do you have any difficulty performing the following activities?:  Driving? No Managing money?  No Feeding yourself? No Getting from bed to chair? No Climbing a flight of stairs? No Preparing food and eating?: No Bathing or showering? Yes - difficulty secondary to back pain Getting dressed:  No Getting to the toilet? No Using the toilet: Yes - difficulty seconary to back pain  Moving around from place to place: No In the past year have you fallen or had a near fall?:Yes - indicates that she falls all the time - due to back pain and scoliosis    Home Safety Has smoke detector and wears seat belts. No firearms. No excess sun exposure.  Are there smokers in your home (other than you)?  No Do you feel safe at home?  Yes  Hearing Difficulties: No Do you often ask people to speak up or repeat themselves? No Do you experience ringing or noises in your ears? No  Do you have difficulty understanding soft or whispered voices? No    Cognitive Testing  Alert? Yes   Normal Appearance? Yes  Oriented to person? Yes  Place? Yes   Time? Yes  Recall of three objects?  Yes  Can perform simple calculations? Yes  Displays appropriate judgment? Yes  Can read the correct time from a watch face? Yes  Do you feel that you have a problem with memory? No  Do you often misplace items? No   Advanced Directives have been discussed with the patient? Yes - POA, Living Will  Current Physicians/Providers and Suppliers  1. Dr. Johnney Killian - [facility]  2. Dr. Fuller Plan - Gastro 3. Terri Piedra, FNP - Primary  care 4. [facility] - Back care  Indicate any recent Medical Services you may have received from other than [facility] providers in the past year (date may be approximate).  All answers were reviewed with the patient and necessary referrals were made:  Mauricio Po, [place]   02/10/2015    Review of Systems  Constitutional: Denies fever, chills, fatigue, or significant weight gain/loss. HENT: Head: Denies headache or neck pain Ears: Denies changes in hearing, ringing in ears, earache, drainage Nose: Denies discharge, stuffiness, itching, nosebleed, sinus pain Throat: Denies sore throat, hoarseness, dry mouth, sores, thrush Eyes: Denies loss/changes in vision, pain, redness, blurry/double  vision, flashing lights Cardiovascular: Denies chest pain/discomfort, tightness, palpitations, shortness of breath with activity, difficulty lying down, swelling, sudden awakening with shortness of breath Respiratory: Denies shortness of breath, cough, sputum production, wheezing Gastrointestinal: Denies dysphasia, heartburn, change in appetite, nausea, change in bowel habits, rectal bleeding, constipation, diarrhea, yellow skin or eyes Genitourinary: Denies frequency, urgency, burning/pain, blood in urine, incontinence, change in urinary strength. Musculoskeletal: Denies muscle/joint pain, stiffness, back pain, redness or swelling of joints, trauma Scoliosis and chronic back pain Skin: Denies rashes, lumps, itching, dryness, color changes, or hair/nail changes Neurological: Denies dizziness, fainting, seizures, weakness, numbness, tingling, tremor Psychiatric - Denies nervousness, stress, depression or memory loss Endocrine: Denies heat or cold intolerance, sweating, frequent urination, excessive thirst, changes in appetite Hematologic: Denies ease of bruising or bleeding    Objective:    BP 120/84 mmHg  Pulse 65  Temp(Src) 98.4 F (36.9 C) (Oral)  Resp 18  Ht 5\' 2"  (1.575 m)  Wt 187 lb (84.823 kg)  BMI 34.19 kg/m2  SpO2 96% Nursing note and vital signs reviewed.  Physical Exam  Constitutional: She is oriented to person, place, and time. She appears well-developed and well-nourished. No distress.  Cardiovascular: Normal rate, regular rhythm, normal heart sounds and intact distal pulses.   Pulmonary/Chest: Effort normal and breath sounds normal.  Neurological: She is alert and oriented to person, place, and time.  Skin: Skin is warm and dry.  Psychiatric: She has a normal mood and affect. Her behavior is normal. Judgment and thought content normal.       Assessment & Plan:   During the course of the visit the patient was educated and counseled about appropriate screening and  preventive services including:    Influenza vaccine  Td vaccine  Colorectal cancer screening  Diabetes screening  Nutrition counseling   Smoking cessation counseling  Diet review for nutrition referral? Yes ____  Not Indicated _X___   Patient Instructions (the written plan) was given to the patient.  Medicare Attestation I have personally reviewed: The patient's medical and social history Their use of alcohol, tobacco or illicit drugs Their current medications and supplements The patient's functional ability including ADLs,fall risks, home safety risks, cognitive, and hearing and visual impairment Diet and physical activities Evidence for depression or mood disorders  The patient's weight, height, BMI,  have been recorded in the chart.  I have made referrals, counseling, and provided education to the patient based on review of the above and I have provided the patient with a written personalized care plan for preventive services.     Mauricio Po, [place]   02/10/2015    Problem List Items Addressed This Visit      Other   Hyperlipidemia LDL goal <100 - Primary   Relevant Orders   Basic metabolic panel   CBC   TSH   Depression   Medicare  annual wellness visit, subsequent    Other Visit Diagnoses    Screening for STDs (sexually transmitted diseases)        Vitamin D deficiency        Relevant Orders    Vitamin D 1,25 dihydroxy    Loss of weight        Relevant Orders    HIV antibody    At risk for breast cancer        Relevant Orders    MR Breast Bilateral Wo Contrast

## 2015-02-10 NOTE — Progress Notes (Signed)
Pre visit review using our clinic review tool, if applicable. No additional management support is needed unless otherwise documented below in the visit note. 

## 2015-02-15 ENCOUNTER — Other Ambulatory Visit (INDEPENDENT_AMBULATORY_CARE_PROVIDER_SITE_OTHER): Payer: Medicare Other

## 2015-02-15 DIAGNOSIS — E785 Hyperlipidemia, unspecified: Secondary | ICD-10-CM | POA: Diagnosis not present

## 2015-02-15 DIAGNOSIS — R634 Abnormal weight loss: Secondary | ICD-10-CM

## 2015-02-15 DIAGNOSIS — E559 Vitamin D deficiency, unspecified: Secondary | ICD-10-CM

## 2015-02-15 LAB — BASIC METABOLIC PANEL
BUN: 11 mg/dL (ref 6–23)
CALCIUM: 10.6 mg/dL — AB (ref 8.4–10.5)
CO2: 26 mEq/L (ref 19–32)
CREATININE: 0.72 mg/dL (ref 0.40–1.20)
Chloride: 108 mEq/L (ref 96–112)
GFR: 107.07 mL/min (ref >60.00)
Glucose, Bld: 107 mg/dL — ABNORMAL HIGH (ref 70–99)
POTASSIUM: 4.5 meq/L (ref 3.5–5.1)
Sodium: 141 mEq/L (ref 135–145)

## 2015-02-15 LAB — CBC
HCT: 42.8 % (ref 36.0–46.0)
HEMOGLOBIN: 14.2 g/dL (ref 12.0–15.0)
MCHC: 33.3 g/dL (ref 30.0–36.0)
MCV: 91.9 fl (ref 78.0–100.0)
Platelets: 356 10*3/uL (ref 150.0–400.0)
RBC: 4.65 Mil/uL (ref 3.87–5.11)
RDW: 13.1 % (ref 11.5–15.5)
WBC: 6.6 10*3/uL (ref 4.0–10.5)

## 2015-02-15 LAB — TSH: TSH: 0.97 u[IU]/mL (ref 0.35–4.50)

## 2015-02-16 DIAGNOSIS — F3170 Bipolar disorder, currently in remission, most recent episode unspecified: Secondary | ICD-10-CM | POA: Diagnosis not present

## 2015-02-16 LAB — HIV ANTIBODY (ROUTINE TESTING W REFLEX): HIV: NONREACTIVE

## 2015-02-18 LAB — VITAMIN D 1,25 DIHYDROXY
Vitamin D 1, 25 (OH)2 Total: 79 pg/mL — ABNORMAL HIGH (ref 18–72)
Vitamin D2 1, 25 (OH)2: <8 pg/mL
Vitamin D3 1, 25 (OH)2: 79 pg/mL

## 2015-02-23 ENCOUNTER — Telehealth: Payer: Self-pay | Admitting: Family

## 2015-02-23 NOTE — Telephone Encounter (Signed)
Pt aware of results. Results sent in the mail.

## 2015-02-23 NOTE — Telephone Encounter (Signed)
Please inform patient that her HIV was negative. Her Vitamin D is normal to slightly elevated. Otherwise her kidney function, electrolytes, thyroid function and white/red blood cells are within the normal limits. No additional action is needed at this time.

## 2015-02-25 ENCOUNTER — Ambulatory Visit (INDEPENDENT_AMBULATORY_CARE_PROVIDER_SITE_OTHER): Payer: Medicare Other | Admitting: Podiatry

## 2015-02-25 ENCOUNTER — Encounter: Payer: Self-pay | Admitting: Podiatry

## 2015-02-25 DIAGNOSIS — M79671 Pain in right foot: Secondary | ICD-10-CM

## 2015-02-25 DIAGNOSIS — M722 Plantar fascial fibromatosis: Secondary | ICD-10-CM

## 2015-02-25 NOTE — Progress Notes (Signed)
Subjective:     Patient ID: Brittany [name], female   DOB: 1955-07-01, 59 y.o.   MRN: [id number]  HPI patient presents stating I'm here for my shockwave   Review of Systems     Objective:   Physical Exam Neurovascular status intact with discomfort plantar aspect left heel    Assessment:     Plantar fasciitis    Plan:     Initiated shockwave 3.0 17 frequency 3000 shocks tolerated well

## 2015-03-02 ENCOUNTER — Telehealth: Payer: Self-pay | Admitting: Family

## 2015-03-02 NOTE — Telephone Encounter (Signed)
That’s fine with me

## 2015-03-02 NOTE — Telephone Encounter (Signed)
Patient would like to change her PCP to Dr. Vertell Novak because she wants a female doctor to do her pap smears.  Please advise.

## 2015-03-02 NOTE — Telephone Encounter (Signed)
If this is the only reason she is switching would recommend against as review of her chart reveals that she does not need any more PAP smears. Would recommend she stay with her current provider she otherwise wants to change.

## 2015-03-04 ENCOUNTER — Ambulatory Visit (INDEPENDENT_AMBULATORY_CARE_PROVIDER_SITE_OTHER): Payer: Medicare Other | Admitting: Podiatry

## 2015-03-04 DIAGNOSIS — M722 Plantar fascial fibromatosis: Secondary | ICD-10-CM

## 2015-03-05 NOTE — Progress Notes (Signed)
Subjective:     Patient ID: Brittany [name], female   DOB: April 18, 1956, 59 y.o.   MRN: [id number]  HPI patient presents stating I seem somewhat improved   Review of Systems     Objective:   Physical Exam Neurovascular status intact with discomfort still noted heel but improved    Assessment:     Improved plantar fasciitis    Plan:     Shockwave administered 3000 shocks 3.6 intensity 17 frequency tolerated well

## 2015-03-06 ENCOUNTER — Inpatient Hospital Stay: Admission: RE | Admit: 2015-03-06 | Payer: Medicare Other | Source: Ambulatory Visit

## 2015-03-08 ENCOUNTER — Other Ambulatory Visit: Payer: Medicare Other

## 2015-03-11 ENCOUNTER — Encounter: Payer: Self-pay | Admitting: Podiatry

## 2015-03-11 ENCOUNTER — Ambulatory Visit (INDEPENDENT_AMBULATORY_CARE_PROVIDER_SITE_OTHER): Payer: Medicare Other | Admitting: Podiatry

## 2015-03-11 DIAGNOSIS — M722 Plantar fascial fibromatosis: Secondary | ICD-10-CM

## 2015-03-11 NOTE — Progress Notes (Signed)
Subjective:     Patient ID: Brittany [name], female   DOB: January 23, 1956, 59 y.o.   MRN: [id number]  HPI patient states it seems to be feeling some better but still sore   Review of Systems     Objective:   Physical Exam Neurovascular status intact with discomfort in the plantar right heel that upon palpation is improved from previous    Assessment:     Improving plantar fasciitis right    Plan:     Shockwave administered 4.4 intensity 17 frequency for 3000 shocks and tolerated well and reappoint 4 weeks to reevaluate

## 2015-03-16 ENCOUNTER — Ambulatory Visit: Payer: Medicare Other | Admitting: Physical Medicine & Rehabilitation

## 2015-03-16 ENCOUNTER — Encounter: Payer: Medicare Other | Attending: Physical Medicine & Rehabilitation

## 2015-03-16 NOTE — Telephone Encounter (Signed)
To clarify patient has called back and she is wanting to switch to a female PCP and she is needing a pap to check for the HPV virus.  Will this change your answer?

## 2015-03-16 NOTE — Telephone Encounter (Signed)
As previously stated she does not need PAP smear and would recommend against changing just to get a PAP smear as she could easily go to a gynecologist for her pap smear if needed.

## 2015-03-17 NOTE — Telephone Encounter (Signed)
Pt informed

## 2015-03-19 ENCOUNTER — Ambulatory Visit
Admission: RE | Admit: 2015-03-19 | Discharge: 2015-03-19 | Disposition: A | Discharge status: Home / Self Care | Payer: Medicare Other | Source: Ambulatory Visit | Attending: Family | Admitting: Family

## 2015-03-19 DIAGNOSIS — N6489 Other specified disorders of breast: Secondary | ICD-10-CM | POA: Diagnosis not present

## 2015-03-19 DIAGNOSIS — Z9189 Other specified personal risk factors, not elsewhere classified: Secondary | ICD-10-CM

## 2015-03-19 DIAGNOSIS — Z803 Family history of malignant neoplasm of breast: Secondary | ICD-10-CM | POA: Diagnosis not present

## 2015-03-19 MED ORDER — GADOBENATE DIMEGLUMINE 529 MG/ML IV SOLN
18.0000 mL | Freq: Once | INTRAVENOUS | Status: AC | PRN
  Administered 2015-03-19: 18 mL via INTRAVENOUS

## 2015-03-22 ENCOUNTER — Telehealth: Payer: Self-pay | Admitting: Family

## 2015-03-22 NOTE — Telephone Encounter (Signed)
LVM asking pt to call back  ?

## 2015-03-22 NOTE — Telephone Encounter (Signed)
Please inform patient that her MRI of her breasts was normal with no evidence of malignancy (cancer).

## 2015-03-25 ENCOUNTER — Telehealth: Payer: Self-pay | Admitting: *Deleted

## 2015-03-25 NOTE — Telephone Encounter (Addendum)
Pt states the pain medication Dr. Paulla Dolly ordered has Acetaminophen, which she can not take since she has a liver problem, and the medication Dr. Milinda Pointer ordered previously is an ASAID and she can not take that either.  Pt states she needs some type of pain reliever.  Dr. Paulla Dolly order Codeine 30mg  #30 one tablet every 6 hours prn foot pain.  Informed pt the rx would need to be picked up in the [place] office.

## 2015-03-26 MED ORDER — CODEINE SULFATE 30 MG PO TABS: 30.0000 mg | ORAL_TABLET | Freq: Four times a day (QID) | ORAL | Status: DC | PRN

## 2015-03-26 NOTE — Telephone Encounter (Signed)
There should be a hydrocodone or codeine without either componet

## 2015-04-15 ENCOUNTER — Ambulatory Visit (INDEPENDENT_AMBULATORY_CARE_PROVIDER_SITE_OTHER): Payer: Medicare Other | Admitting: Podiatry

## 2015-04-15 DIAGNOSIS — M722 Plantar fascial fibromatosis: Secondary | ICD-10-CM

## 2015-04-16 NOTE — Progress Notes (Signed)
Subjective:     Patient ID: Brittany [name], female   DOB: [birth year], 59 y.o.   MRN: [id number]  HPI patient states doing much better   Review of Systems     Objective:   Physical Exam Neurovascular status unchanged with diminished discomfort right plantar heel    Assessment:     Improved plantar fasciitis    Plan:     Shockwave 3000 shocks at 4.2 intensity 17 frequency tolerated well and reappoint as needed which I instructed to patient

## 2015-04-20 ENCOUNTER — Ambulatory Visit (HOSPITAL_BASED_OUTPATIENT_CLINIC_OR_DEPARTMENT_OTHER): Payer: Medicare Other | Admitting: Physical Medicine & Rehabilitation

## 2015-04-20 ENCOUNTER — Encounter: Payer: Self-pay | Admitting: Physical Medicine & Rehabilitation

## 2015-04-20 ENCOUNTER — Encounter: Payer: Medicare Other | Attending: Physical Medicine & Rehabilitation

## 2015-04-20 VITALS — BP 109/76 | HR 67 | Resp 15

## 2015-04-20 DIAGNOSIS — M7062 Trochanteric bursitis, left hip: Secondary | ICD-10-CM | POA: Diagnosis not present

## 2015-04-20 DIAGNOSIS — G8929 Other chronic pain: Secondary | ICD-10-CM

## 2015-04-20 DIAGNOSIS — M791 Myalgia: Secondary | ICD-10-CM | POA: Diagnosis not present

## 2015-04-20 DIAGNOSIS — M545 Low back pain: Secondary | ICD-10-CM

## 2015-04-20 DIAGNOSIS — M7918 Myalgia, other site: Secondary | ICD-10-CM | POA: Insufficient documentation

## 2015-04-20 NOTE — Progress Notes (Signed)
Subjective:    Patient ID: Brittany [name], female    DOB: Sep 05, 1955, 59 y.o.   MRN: [id number]  HPI Chief complaint low back pain and thigh pain for many years  59 year old female who gives a history of low back pain since 1979. She has been taking Tylenol for this. More recently she was switched to nonsteroidal anti-inflammatories such as ibuprofen 800 mg 3 times per day and cause of elevated liver enzymes. This gives her good relief. She has not tried land-based physical therapy but did try aquatic physical therapy. She has been limited by plantar fasciitis but this has improved after sound wave treatments by her podiatrist. She has been evaluated by orthopedic surgeon Dr. Ninfa Linden who felt she had hip bursitis.  Patient has had no recent falls or other trauma. Past history significant for bipolar disorder and is on multiple medications including carbamazepine, Lamictal, Wellbutrin,Zoloft.  Complains of abdominal muscle spasms since hysterectomy, takes Robaxin for this however this makes her very drowsy. Was chronically taking Tylenol but this was stopped due to elevated liver enzymes. Last LFTs have been reviewed and still has elevated AST  Patient has had aquatic based physical therapy but not land-based. She has tried Pilates and has bought some [employer] research [employer], [employer].  Patient in addition has some upper back pain. This seems to be between the spine and the shoulder blade. No neck pain. No pain that travels down the arms. No numbness or tingling in the hands or in the feet.  Reviewed MRI lumbar spine from 2014 demonstrating L4-L5 disc protrusion rated as moderate. No significant nerve root compression seen.The actual films were reviewed with the patient and a spine model was used to correlate the findings.  Patient wears an orthosis for her low back. She states that this allows her to do housework such as vacuuming.  Social history she is concerned about her husband. He has end-stage  renal disease is on dialysis and is awaiting renal transplant Pain Inventory Average Pain 8 Pain Right Now NA My pain is sharp, burning, dull, stabbing and aching  In the last 24 hours, has pain interfered with the following? General activity 10 Relation with others 10 Enjoyment of life 10 What TIME of day is your pain at its worst? Morning and night Sleep (in general) Fair  Pain is worse with: walking, bending, sitting and standing Pain improves with: rest and medication Relief from Meds: NA  Mobility walk with assistance use a cane use a walker how many minutes can you walk? 5 ability to climb steps?  no do you drive?  no  Function disabled: date disabled 64 I need assistance with the following:  dressing, bathing, toileting, household duties and shopping  Neuro/Psych weakness tingling trouble walking spasms dizziness confusion depression anxiety  Prior Studies Any changes since last visit?  no  Physicians involved in your care Any changes since last visit?  no   Family History  Problem Relation Age of Onset  . Breast cancer    . Lung cancer    . Colon cancer Maternal Uncle   . Colon polyps Maternal Uncle   . Other Paternal Aunt     x 2, blood clotting disorder  . Heart disease Sister   . Irritable bowel syndrome Cousin   . Kidney disease Maternal Uncle   . Esophageal cancer Paternal Uncle   . Rectal cancer Neg Hx   . Stomach cancer Neg Hx    Social History   Social History  .  Marital Status: Married    Spouse Name: N/A  . Number of Children: 4  . Years of Education: N/A   Occupational History  . disabled    Social History Main Topics  . Smoking status: Former Smoker -- 0.10 packs/day for 7 years    Types: Cigarettes    Quit date: 06/19/1981  . Smokeless tobacco: Never Used  . Alcohol Use: No     Comment: rare  . Drug Use: No  . Sexual Activity: Not Asked   Other Topics Concern  . None   Social History Narrative   Past  Surgical History  Procedure Laterality Date  . Knee surgery Bilateral     right x 2 left x 1.  arthroscopy  . Radical hysterectomy    . Tonsillectomy    . Breast lumpectomy Right last done 2005    x 2  . Thumb and pinkie finger surgery Right 2014  . Eus N/A 01/27/2014    Procedure: ESOPHAGEAL ENDOSCOPIC ULTRASOUND (EUS) RADIAL;  Surgeon: Arta Silence, MD;  Location: [facility];  Service: Endoscopy;  Laterality: N/A;  . Hand surgery Bilateral     for arthritis  left 02/02/14- right approx 2014  . Cholecystectomy N/A 03/07/2014    Procedure: LAPAROSCOPIC CHOLECYSTECTOMY WITH INTRAOPERATIVE CHOLANGIOGRAM;  Surgeon: Fanny Skates, MD;  Location: [facility];  Service: General;  Laterality: N/A;   Past Medical History  Diagnosis Date  . HYPERLIPIDEMIA   . HEMORRHOIDS-INTERNAL   . URI   . OBSTRUCTIVE SLEEP APNEA   . DISORDER, BIPOLAR NEC   . DEPRESSION   . ALOPECIA   . ALLERGIC RHINITIS   . ACNE NEC   . Anal fissure   . Arthritis   . Depression   . Bowel obstruction ([place])   . GERD (gastroesophageal reflux disease)   . Seasonal allergies   . Anxiety     Panic Attack.  None in a long time  . Constipation     drinks tea with senna  . Chronic headaches     migraine- none in years   BP 109/76 mmHg  Pulse 67  Resp 15  SpO2 99%  Opioid Risk Score:   Fall Risk Score:  `1  Depression screen PHQ 2/9  Depression screen PHQ 2/9 04/20/2015  Decreased Interest 3  Down, Depressed, Hopeless 3  PHQ - 2 Score 6  Altered sleeping 3  Tired, decreased energy 3  Change in appetite 3  Feeling bad or failure about yourself  0  Trouble concentrating 3  Moving slowly or fidgety/restless 0  Suicidal thoughts 0  PHQ-9 Score 18  Difficult doing work/chores Extremely dIfficult     Review of Systems  Constitutional: Positive for diaphoresis and unexpected weight change.  Respiratory: Positive for apnea, cough and wheezing.   Gastrointestinal: Positive for constipation.  Genitourinary:        Urine Retention  Musculoskeletal:       Spasms  Skin: Positive for rash.  Neurological: Positive for dizziness and weakness.       Tingling Gait Instability  Psychiatric/Behavioral: Positive for confusion. The patient is nervous/anxious.        Depression  All other systems reviewed and are negative.      Objective:   Physical Exam  Constitutional: She is oriented to person, place, and time. She appears well-developed and well-nourished.  HENT:  Head: Normocephalic and atraumatic.  Eyes: Conjunctivae and EOM are normal. Pupils are equal, round, and reactive to light.  Neck: Normal range of motion.  Neck supple.  Musculoskeletal:       Right shoulder: Normal.       Left shoulder: Normal.       Right elbow: Normal.      Left elbow: Normal.       Right wrist: Normal.       Left wrist: Normal.       Right hip: Normal.       Left hip: She exhibits bony tenderness.       Right knee: Normal.       Left knee: Normal.       Right ankle: Normal.       Left ankle: Normal.       Cervical back: She exhibits tenderness. She exhibits normal range of motion and no deformity.       Thoracic back: She exhibits decreased range of motion and tenderness. She exhibits no deformity.       Lumbar back: She exhibits decreased range of motion and tenderness. She exhibits no deformity.  Neurological: She is alert and oriented to person, place, and time.  Reflex Scores:      Tricep reflexes are 3+ on the right side and 3+ on the left side.      Bicep reflexes are 3+ on the right side and 3+ on the left side.      Brachioradialis reflexes are 3+ on the right side and 3+ on the left side.      Patellar reflexes are 3+ on the right side and 3+ on the left side.      Achilles reflexes are 3+ on the right side and 3+ on the left side. Motor strength is 5/5 bilateral deltoids, biceps, triceps, grip, hip flexor, knee extensor, ankle dorsiflexor and plantar flexor  Psychiatric: She has a normal mood and  affect.  Nursing note and vitals reviewed.         Assessment & Plan:  1. Chronic low back pain. She has findings consistent with degenerative disc disease as well as lumbar facet arthropathy. She does not have any signs of nerve root compression on examination but does have some vague lower extremity symptoms. Her MRI did not show any compressive lesions.  Patient is already on many psychoactive medications and is sensitive to the sedating side effects of several medications. In addition she has elevated liver enzymes. At this point I feel the treatment should be mainly non-pharmacologic. We discussed lumbar stabilization exercises and the need to get formal training from physical therapy and then continue home exercise program on an ongoing basis. After several months of these exercises she may develop core strength to allow her to wean off of the ibuprofen as well as her lumbar spine orthosis  Elevated opioids risk of 10: Nonnarcotic treatment only  2. Left trochanteric bursitis. She can work on this with physical therapy with some TFL stretching and strengthening as well as hip stabilization exercises. She may benefit from corticosteroid injection however she gets very nervous about any type of injections and would like to think about this.  Return to clinic 6-8 weeks

## 2015-04-20 NOTE — Patient Instructions (Signed)

## 2015-05-05 ENCOUNTER — Ambulatory Visit: Payer: Medicare Other | Attending: Physical Medicine & Rehabilitation | Admitting: Physical Therapy

## 2015-05-05 DIAGNOSIS — M7918 Myalgia, other site: Secondary | ICD-10-CM

## 2015-05-05 DIAGNOSIS — M25561 Pain in right knee: Secondary | ICD-10-CM | POA: Insufficient documentation

## 2015-05-05 DIAGNOSIS — Z7409 Other reduced mobility: Secondary | ICD-10-CM | POA: Diagnosis not present

## 2015-05-05 DIAGNOSIS — G8929 Other chronic pain: Secondary | ICD-10-CM | POA: Diagnosis not present

## 2015-05-05 DIAGNOSIS — M2560 Stiffness of unspecified joint, not elsewhere classified: Secondary | ICD-10-CM

## 2015-05-05 DIAGNOSIS — M791 Myalgia: Secondary | ICD-10-CM | POA: Insufficient documentation

## 2015-05-05 DIAGNOSIS — M5440 Lumbago with sciatica, unspecified side: Secondary | ICD-10-CM | POA: Diagnosis not present

## 2015-05-05 DIAGNOSIS — M25562 Pain in left knee: Secondary | ICD-10-CM | POA: Diagnosis not present

## 2015-05-05 NOTE — Patient Instructions (Signed)

## 2015-05-05 NOTE — Therapy (Addendum)
[place], [place], [address] Phone: [phone]   Fax:  [phone]  Physical Therapy Evaluation  Patient Details  Name: Brittany [name] MRN: [id number] Date of Birth: 1955/11/29 Referring Provider: Letta Pate  Encounter Date: 05/05/2015      PT End of Session - 05/05/15 2138    Visit Number 1   Number of Visits 16   Date for PT Re-Evaluation 06/30/15   PT Start Time 1751   PT Stop Time 1500   PT Time Calculation (min) 45 min   Activity Tolerance Patient limited by pain   Behavior During Therapy Anxious      Past Medical History  Diagnosis Date  . HYPERLIPIDEMIA   . HEMORRHOIDS-INTERNAL   . URI   . OBSTRUCTIVE SLEEP APNEA   . DISORDER, BIPOLAR NEC   . DEPRESSION   . ALOPECIA   . ALLERGIC RHINITIS   . ACNE NEC   . Anal fissure   . Arthritis   . Depression   . Bowel obstruction ([place])   . GERD (gastroesophageal reflux disease)   . Seasonal allergies   . Anxiety     Panic Attack.  None in a long time  . Constipation     drinks tea with senna  . Chronic headaches     migraine- none in years    Past Surgical History  Procedure Laterality Date  . Knee surgery Bilateral     right x 2 left x 1.  arthroscopy  . Radical hysterectomy    . Tonsillectomy    . Breast lumpectomy Right last done 2005    x 2  . Thumb and pinkie finger surgery Right 2014  . Eus N/A 01/27/2014    Procedure: ESOPHAGEAL ENDOSCOPIC ULTRASOUND (EUS) RADIAL;  Surgeon: Arta Silence, MD;  Location: [facility];  Service: Endoscopy;  Laterality: N/A;  . Hand surgery Bilateral     for arthritis  left 02/02/14- right approx 2014  . Cholecystectomy N/A 03/07/2014    Procedure: LAPAROSCOPIC CHOLECYSTECTOMY WITH INTRAOPERATIVE CHOLANGIOGRAM;  Surgeon: Fanny Skates, MD;  Location: [facility];  Service: General;  Laterality: N/A;    There were no vitals filed for this visit.  Visit Diagnosis:  Midline low back pain with  sciatica, sciatica laterality unspecified  Chronic myofascial pain  Arthralgia of both lower legs  Decreased mobility and endurance  Joint stiffness of spine      Subjective Assessment - 05/05/15 1423    Subjective Patient reports pain in low back and leg pain since 1978 when she was involved in an incident while in the [employer].  Her pain has progressed to where muscele spasms interfere with her ability to walk, move at times.  She has muscle spasms, difficulty lifting, changing positions, weakness and difficulty even with simple ADLS.   She reports  poor memory and problem solving, all due to pain meds. She would like to try PT to see if she can have relief  of pain, lose weight and improve quality of life.    Pertinent History bipolar, DDD, plantar fascitiis   Limitations Sitting;Lifting;Standing;Walking;House hold activities   How long can you sit comfortably? depends on chair,    How long can you stand comfortably? 30 min    How long can you walk comfortably? can walk a couple of blocks then gets spasms   Diagnostic tests none recent    Patient Stated Goals to get off meds and have less pain    Currently in  Pain? Yes   Pain Score 6    Pain Location Back   Pain Orientation Posterior;Upper;Mid;Lower   Pain Descriptors / Indicators Cramping;Spasm   Pain Type Chronic pain   Pain Radiating Towards legs bilateral , sometimes ant sometimes post.    Pain Onset More than a month ago   Pain Frequency Constant   Aggravating Factors  activity    Pain Relieving Factors heat, back brace    Effect of Pain on Daily Activities limits all activity   Multiple Pain Sites Yes            [facility] PT Assessment - 05/05/15 1436    Assessment   Medical Diagnosis bilateral    Referring Provider Kirsteins   Onset Date/Surgical Date --  chronic   Prior Therapy Yes   Precautions   Precautions None   Required Braces or Orthoses --  has a back brace but has tried to stop wearing it    Restrictions   Weight Bearing Restrictions No   Balance Screen   Has the patient fallen in the past 6 months Yes   How many times? 1  down the stairs   Has the patient had a decrease in activity level because of a fear of falling?  Yes   Is the patient reluctant to leave their home because of a fear of falling?  Yes   [place] residence   Prior Function   Level of Independence Independent with household mobility with device;Independent with community mobility with device   Cognition   Overall Cognitive Status Within Functional Limits for tasks assessed   Observation/Other Assessments   Focus on Therapeutic Outcomes (FOTO)  86%   Sensation   Light Touch Appears Intact   Coordination   Gross Motor Movements are Fluid and Coordinated Not tested   Posture/Postural Control   Posture/Postural Control Postural limitations   Postural Limitations Rounded Shoulders;Forward head;Anterior pelvic tilt   Posture Comments genu valgus   AROM   Lumbar Flexion 30   Lumbar Extension 20   Lumbar - Right Side Bend 50%   Lumbar - Left Side Bend 75%   Lumbar - Right Rotation declined   Lumbar - Left Rotation declined   Strength   Right Hip Flexion 3-/5   Right Hip Extension 3/5   Right Hip ABduction 3+/5   Left Hip Flexion 3/5   Left Hip Extension 3+/5   Left Hip ABduction 3/5   Right Knee Flexion 4-/5   Right Knee Extension 4/5   Left Knee Flexion 4/5   Left Knee Extension 4+/5   Right/Left Ankle --  DF 4/5 bilat   Flexibility   Soft Tissue Assessment /Muscle Length yes   Hamstrings min tightness   Palpation   Spinal mobility not tol.    Palpation comment Pain grossly throughout lumabar spine and into thoracic spine, Rt. periscapular    Special Tests    Special Tests --  Neg SLR bilat.                            PT Education - 05/05/15 2137    Education provided Yes   Education Details PT/POC, HEP for trunk stretching (sink),  posture, body mechanics, pain scale   Person(s) Educated Patient   Methods Explanation;Handout   Comprehension Verbalized understanding;Need further instruction          PT Short Term Goals - 05/05/15 2149  PT SHORT TERM GOAL #1   Title Pt will be I with initial HEP   Time 4   Period Weeks   Status New   PT SHORT TERM GOAL #2   Title Pt will be able to lift and squat with good body mechanics to prevent further injury   Time 4   Period Weeks   Status New   PT SHORT TERM GOAL #3   Title Pt will be able to roll and transfer supine to sit with min increase in back pain.    Time 4   Period Weeks   Status New   PT SHORT TERM GOAL #4   Title Pt will complete Balance Screen and set goal if indicated   Time 4   Period Weeks   Status New           PT Long Term Goals - 06/03/2015 08-26-2149    PT LONG TERM GOAL #1   Title PT will be I with more advanced HEP as of last visit   Time 8   Period Weeks   Status New   PT LONG TERM GOAL #2   Title Pt will be able to sweep and do other light housework with min increase in back pain.    Time 8   Period Weeks   Status New   PT LONG TERM GOAL #3   Title Pt will be able to stand/walk in the community for 30 min and report min increase  LEback  pain.   Time 8   Period Weeks   Status New   PT LONG TERM GOAL #4   Title Balance Goal TBA   Time 8   Period Weeks   Status New   PT LONG TERM GOAL #5   Title Pt will score less than 60% limite don FOTO to indicate improved functional mobility.    Time 8   Period Weeks   Status New               Plan - June 03, 2015 08-26-38    Clinical Impression Statement This patient presents with chronic pain and fear of movement, muscle spasms and impaired functional mobility.  She has decreased sustained muscle contraction for MMT, denies red flag sx.  She seemed to tolerate supine mat work with min to no increase in pain and as open to developing an HEP for core strength and trunk flexibility.    Pt  will benefit from skilled therapeutic intervention in order to improve on the following deficits Decreased strength;Decreased mobility;Decreased cognition;Impaired flexibility;Improper body mechanics;Decreased activity tolerance;Pain;Obesity;Increased muscle spasms;Decreased endurance;Increased fascial restricitons;Decreased range of motion;Difficulty walking   Rehab Potential Fair   PT Frequency 2x / week   PT Duration 8 weeks  if progressing towards goals   PT Treatment/Interventions ADLs/Self Care Home Management;Ultrasound;Neuromuscular re-education;Patient/family education;Passive range of motion;Dry needling;Cryotherapy;Electrical Stimulation;Functional mobility training;Taping;Therapeutic activities;Therapeutic exercise;Moist Heat;Traction;Manual techniques   PT Next Visit Plan develop mat HEP, modailities (IFC, heat) and answer questions   PT Home Exercise Plan sink stretch/quadruped    Consulted and Agree with Plan of Care Patient          G-Codes - Jun 03, 2015 08-27-55    Functional Assessment Tool Used FOTO   Functional Limitation Mobility: Walking and moving around   Mobility: Walking and Moving Around Current Status [phone]) At least 80 percent but less than 100 percent impaired, limited or restricted   Mobility: Walking and Moving Around Goal Status ([id number]) At least 60 percent but less than 80  percent impaired, limited or restricted       Problem List Patient Active Problem List   Diagnosis Date Noted  . Chronic bilateral low back pain without sciatica 04/20/2015  . Trochanteric bursitis of left hip 04/20/2015  . Cervical myofascial pain syndrome 04/20/2015  . Medicare annual wellness visit, subsequent 02/10/2015  . Gallstones 02/16/2014  . Chest pain 09/15/2013  . Dyspnea 09/15/2013  . Hyperparathyroidism ([employer]) 01/29/2013  . Chronic back pain 10/21/2012  . ALOPECIA 03/05/2009  . Obstructive sleep apnea 10/06/2008  . HEMORRHOIDS-INTERNAL 04/28/2008  . COLONIC POLYPS,  HYPERPLASTIC, HX OF 04/24/2008  . Hyperlipidemia LDL goal <100 03/20/2007  . DISORDER, BIPOLAR NEC 03/20/2007  . Depression 03/20/2007    Miqueas Whilden 05/05/2015, 9:59 PM  [facility] [address] [place], [place], [address] Phone: [phone]   Fax:  [phone]  Name: Brittany [name] MRN: [id number] Date of Birth: January 17, 1956   Raeford Razor, PT 05/05/2015 9:59 PM Phone: [phone] Fax: [phone]   PHYSICAL THERAPY DISCHARGE SUMMARY  Visits from Start of Care: 1  Current functional level related to goals / functional outcomes: See above     Remaining deficits: See above   Education / Equipment: See above  Plan: Patient agrees to discharge.  Patient goals were not met. Patient is being discharged due to not returning since the last visit.  ?????    Raeford Razor, PT 09/06/2015 3:19 PM Phone: [phone] Fax: [phone]

## 2015-05-18 ENCOUNTER — Ambulatory Visit: Payer: Medicare Other | Admitting: Physical Therapy

## 2015-05-20 ENCOUNTER — Encounter: Payer: Medicare Other | Admitting: Physical Therapy

## 2015-05-24 ENCOUNTER — Encounter: Payer: Medicare Other | Admitting: Physical Therapy

## 2015-05-26 ENCOUNTER — Encounter: Payer: Medicare Other | Admitting: Physical Therapy

## 2015-05-31 ENCOUNTER — Encounter: Payer: Medicare Other | Admitting: Physical Therapy

## 2015-06-01 ENCOUNTER — Ambulatory Visit: Payer: Medicare Other | Admitting: Physical Medicine & Rehabilitation

## 2015-06-02 ENCOUNTER — Encounter: Payer: Medicare Other | Admitting: Physical Therapy

## 2015-06-07 ENCOUNTER — Encounter: Payer: Medicare Other | Admitting: Physical Therapy

## 2015-06-08 DIAGNOSIS — F3170 Bipolar disorder, currently in remission, most recent episode unspecified: Secondary | ICD-10-CM | POA: Diagnosis not present

## 2015-06-09 ENCOUNTER — Encounter: Payer: Medicare Other | Admitting: Physical Therapy

## 2015-06-24 DIAGNOSIS — R102 Pelvic and perineal pain: Secondary | ICD-10-CM | POA: Diagnosis not present

## 2015-06-24 DIAGNOSIS — R3129 Other microscopic hematuria: Secondary | ICD-10-CM | POA: Diagnosis not present

## 2015-06-24 DIAGNOSIS — N281 Cyst of kidney, acquired: Secondary | ICD-10-CM | POA: Diagnosis not present

## 2015-06-24 DIAGNOSIS — Z0000 Encounter for general adult medical examination without abnormal findings: Secondary | ICD-10-CM | POA: Diagnosis not present

## 2015-07-01 DIAGNOSIS — R3129 Other microscopic hematuria: Secondary | ICD-10-CM | POA: Diagnosis not present

## 2015-07-01 DIAGNOSIS — Z0000 Encounter for general adult medical examination without abnormal findings: Secondary | ICD-10-CM | POA: Diagnosis not present

## 2015-07-06 DIAGNOSIS — F3170 Bipolar disorder, currently in remission, most recent episode unspecified: Secondary | ICD-10-CM | POA: Diagnosis not present

## 2015-07-06 DIAGNOSIS — R5383 Other fatigue: Secondary | ICD-10-CM | POA: Diagnosis not present

## 2015-07-06 DIAGNOSIS — Z79899 Other long term (current) drug therapy: Secondary | ICD-10-CM | POA: Diagnosis not present

## 2015-07-06 DIAGNOSIS — F3175 Bipolar disorder, in partial remission, most recent episode depressed: Secondary | ICD-10-CM | POA: Diagnosis not present

## 2015-07-07 ENCOUNTER — Other Ambulatory Visit: Payer: Self-pay | Admitting: Family

## 2015-07-12 DIAGNOSIS — N63 Unspecified lump in breast: Secondary | ICD-10-CM | POA: Diagnosis not present

## 2015-07-12 DIAGNOSIS — N644 Mastodynia: Secondary | ICD-10-CM | POA: Diagnosis not present

## 2015-07-16 ENCOUNTER — Ambulatory Visit: Payer: Medicare Other | Admitting: Family

## 2015-07-20 DIAGNOSIS — M1711 Unilateral primary osteoarthritis, right knee: Secondary | ICD-10-CM | POA: Diagnosis not present

## 2015-07-26 DIAGNOSIS — Z77128 Contact with and (suspected) exposure to other hazards in the physical environment: Secondary | ICD-10-CM | POA: Diagnosis not present

## 2015-07-26 DIAGNOSIS — Z779 Other contact with and (suspected) exposures hazardous to health: Secondary | ICD-10-CM | POA: Diagnosis not present

## 2015-07-26 DIAGNOSIS — Z01419 Encounter for gynecological examination (general) (routine) without abnormal findings: Secondary | ICD-10-CM | POA: Diagnosis not present

## 2015-08-03 ENCOUNTER — Ambulatory Visit (INDEPENDENT_AMBULATORY_CARE_PROVIDER_SITE_OTHER): Payer: Medicare Other | Admitting: Internal Medicine

## 2015-08-03 ENCOUNTER — Ambulatory Visit: Payer: Medicare Other | Admitting: Internal Medicine

## 2015-08-03 ENCOUNTER — Telehealth: Payer: Self-pay | Admitting: Family

## 2015-08-03 ENCOUNTER — Encounter: Payer: Self-pay | Admitting: Internal Medicine

## 2015-08-03 VITALS — BP 134/60 | HR 76 | Temp 98.7°F | Ht 62.0 in | Wt 194.5 lb

## 2015-08-03 DIAGNOSIS — R059 Cough, unspecified: Secondary | ICD-10-CM | POA: Insufficient documentation

## 2015-08-03 DIAGNOSIS — R05 Cough: Secondary | ICD-10-CM | POA: Insufficient documentation

## 2015-08-03 DIAGNOSIS — J019 Acute sinusitis, unspecified: Secondary | ICD-10-CM | POA: Diagnosis not present

## 2015-08-03 MED ORDER — LEVOFLOXACIN 500 MG PO TABS: 500.0000 mg | ORAL_TABLET | Freq: Every day | ORAL | Status: DC

## 2015-08-03 MED ORDER — PROMETHAZINE-CODEINE 6.25-10 MG/5ML PO SYRP: 5.0000 mL | ORAL_SOLUTION | ORAL | Status: DC | PRN

## 2015-08-03 NOTE — Patient Instructions (Signed)
Please take all new medication as prescribed - the antibiotic, and cough medicine if needed  You can also take Mucinex D (or it's generic off brand) for congestion, and tylenol as needed for pain.  Please continue all other medications as before, and refills have been done if requested.  Please have the pharmacy call with any other refills you may need.  Please keep your appointments with your specialists as you may have planned

## 2015-08-03 NOTE — Progress Notes (Signed)
Pre visit review using our clinic review tool, if applicable. No additional management support is needed unless otherwise documented below in the visit note. 

## 2015-08-03 NOTE — Progress Notes (Signed)
Subjective:    Patient ID: Brittany [name], female    DOB: 24-Aug-1955, 60 y.o.   MRN: [id number]  HPI   Here with 2-3 days acute onset fever, right ear pain, and facial pain, pressure, headache, general weakness and malaise, and greenish d/c, with mild ST and cough, but pt denies chest pain, wheezing, increased sob or doe, orthopnea, PND, increased LE swelling, palpitations, dizziness or syncope.  Pt denies new neurological symptoms such as new headache, or facial or extremity weakness or numbness   Pt denies polydipsia, polyuria Past Medical History  Diagnosis Date  . HYPERLIPIDEMIA   . HEMORRHOIDS-INTERNAL   . URI   . OBSTRUCTIVE SLEEP APNEA   . DISORDER, BIPOLAR NEC   . DEPRESSION   . ALOPECIA   . ALLERGIC RHINITIS   . ACNE NEC   . Anal fissure   . Arthritis   . Depression   . Bowel obstruction ([place])   . GERD (gastroesophageal reflux disease)   . Seasonal allergies   . Anxiety     Panic Attack.  None in a long time  . Constipation     drinks tea with senna  . Chronic headaches     migraine- none in years   Past Surgical History  Procedure Laterality Date  . Knee surgery Bilateral     right x 2 left x 1.  arthroscopy  . Radical hysterectomy    . Tonsillectomy    . Breast lumpectomy Right last done 2005    x 2  . Thumb and pinkie finger surgery Right 2014  . Eus N/A 01/27/2014    Procedure: ESOPHAGEAL ENDOSCOPIC ULTRASOUND (EUS) RADIAL;  Surgeon: Arta Silence, MD;  Location: [facility];  Service: Endoscopy;  Laterality: N/A;  . Hand surgery Bilateral     for arthritis  left 02/02/14- right approx 2014  . Cholecystectomy N/A 03/07/2014    Procedure: LAPAROSCOPIC CHOLECYSTECTOMY WITH INTRAOPERATIVE CHOLANGIOGRAM;  Surgeon: Fanny Skates, MD;  Location: [facility];  Service: General;  Laterality: N/A;    reports that she quit smoking about 34 years ago. Her smoking use included Cigarettes. She has a .7 pack-year smoking history. She has never used smokeless tobacco. She  reports that she does not drink alcohol or use illicit drugs. family history includes Colon cancer in her maternal uncle; Colon polyps in her maternal uncle; Esophageal cancer in her paternal uncle; Heart disease in her sister; Irritable bowel syndrome in her cousin; Kidney disease in her maternal uncle; Other in her paternal aunt. There is no history of Rectal cancer or Stomach cancer. Allergies  Allergen Reactions  . Flexeril [Cyclobenzaprine]     Unable to leave home when taking as she is incoherent/unsure where she is at times  . Hydrocodone     REACTION: GI upset   Current Outpatient Prescriptions on File Prior to Visit  Medication Sig Dispense Refill  . aspirin 325 MG tablet Take 325 mg by mouth once.    Marland Kitchen buPROPion (WELLBUTRIN XL) 300 MG 24 hr tablet Take 300 mg by mouth every morning.    . Carbamazepine (EQUETRO) 300 MG CP12 Take 600 mg by mouth at bedtime.     . cetirizine (ZYRTEC) 10 MG tablet Take 10 mg by mouth daily.    . diclofenac sodium (VOLTAREN) 1 % GEL Apply 2 g topically 4 (four) times daily. 100 g 1  . ibuprofen (ADVIL,MOTRIN) 400 MG tablet Take 2 tablets by mouth as needed.    . lamoTRIgine (LAMICTAL) 150 MG  tablet Take 300 mg by mouth daily.     . meloxicam (MOBIC) 15 MG tablet Take 1 tablet (15 mg total) by mouth daily. 30 tablet 3  . methocarbamol (ROBAXIN) 500 MG tablet Take 1 tablet (500 mg total) by mouth every 8 (eight) hours as needed for muscle spasms. 90 tablet 0  . Multiple Vitamin (MULTIVITAMIN WITH MINERALS) TABS Take 1 tablet by mouth daily.    Marland Kitchen omeprazole (PRILOSEC) 40 MG capsule TAKE 1 CAPSULE DAILY 90 capsule 0  . oxyCODONE-acetaminophen (PERCOCET) 10-325 MG per tablet Take 1 tablet by mouth every 8 (eight) hours as needed for pain. 10 tablet 0  . sertraline (ZOLOFT) 50 MG tablet Take 50 mg by mouth every morning.      Current Facility-Administered Medications on File Prior to Visit  Medication Dose Route Frequency Provider Last Rate Last Dose  .  methylPREDNISolone acetate (DEPO-MEDROL) injection 80 mg  80 mg Intra-articular Once Kennyth Arnold, FNP       Review of Systems All otherwise neg per pt     Objective:   Physical Exam BP 134/60 mmHg  Pulse 76  Temp(Src) 98.7 F (37.1 C) (Oral)  Ht 5\' 2"  (1.575 m)  Wt 194 lb 8 oz (88.225 kg)  BMI 35.57 kg/m2  SpO2 96% VS noted, mild ill Constitutional: Pt appears in no significant distress HENT: Head: NCAT.  Right Ear: External ear normal.  Left Ear: External ear normal.  Bilat tm's with mild erythema.  Max sinus areas mild tender.  Pharynx with mild erythema, no exudate Eyes: . Pupils are equal, round, and reactive to light. Conjunctivae and EOM are normal Neck: Normal range of motion. Neck supple.  Cardiovascular: Normal rate and regular rhythm.   Pulmonary/Chest: Effort normal and breath sounds without rales or wheezing.  Neurological: Pt is alert. Not confused , motor grossly intact Skin: Skin is warm. No rash, no LE edema Psychiatric: Pt behavior is normal. No agitation.         Assessment & Plan:

## 2015-08-03 NOTE — Telephone Encounter (Signed)
Patient Name: Brittany [name]  DOB: 1956-04-21    Initial Comment Caller states she has cold symptoms, started having ear discomfort today   Nurse Assessment  Nurse: Orvan Seen, RN, Jacquilin Date/Time (Eastern Time): 08/03/2015 11:13:03 AM  Confirm and document reason for call. If symptomatic, describe symptoms. You must click the next button to save text entered. ---Caller states she has cold symptoms that started Saturday, started having ear discomfort today  Has the patient traveled out of the country within the last 30 days? ---No  Does the patient have any new or worsening symptoms? ---Yes  Will a triage be completed? ---Yes  Related visit to physician within the last 2 weeks? ---No  Does the PT have any chronic conditions? (i.e. diabetes, asthma, etc.) ---Yes  List chronic conditions. ---GERD, anxiety, arthritis, depression  Is this a behavioral health or substance abuse call? ---No     Guidelines    Guideline Title Affirmed Question Affirmed Notes  Common Cold Earache    Final Disposition User   See Physician within 24 Hours Atkins, RN, Jacquilin    Comments  appointment made with Dr. Jenny Reichmann at 1415- PCP didnt have any openings   Referrals  REFERRED TO PCP OFFICE   Disagree/Comply: Comply

## 2015-08-04 NOTE — Assessment & Plan Note (Signed)
Mild to mod, for antibx course,  to f/u any worsening symptoms or concerns 

## 2015-08-16 ENCOUNTER — Telehealth: Payer: Self-pay | Admitting: Family

## 2015-08-16 NOTE — Telephone Encounter (Signed)
Patient came in on 2/14 and seen Dr. Jenny Reichmann.  She was given an antibiotic, cough syrup and told to take mucinex.  She has finished the medication.  She states she still has cold symptoms and would like to know if she should have another round of medication and cough syrup sent in or if she should come back into office .

## 2015-08-17 NOTE — Telephone Encounter (Signed)
Please advise 

## 2015-08-17 NOTE — Telephone Encounter (Signed)
The levofloxacin is quite a strong antibiotic and I would doubt that she would continue to have a bacterial infection. Therefore I would recommend symptomatic treatment. What other symptoms does she continue to have?

## 2015-08-17 NOTE — Telephone Encounter (Signed)
Patient walked in today to follow up. Advised no update at this point. Please advise.   Ok to leave a vm- verified pharmacy is [facility] on [address]

## 2015-08-18 MED ORDER — PROMETHAZINE-CODEINE 6.25-10 MG/5ML PO SYRP: 5.0000 mL | ORAL_SOLUTION | ORAL | Status: DC | PRN

## 2015-08-18 NOTE — Telephone Encounter (Signed)
Can pt have refill of cough medication?

## 2015-08-18 NOTE — Addendum Note (Signed)
Addended by: Mauricio Po D on: 08/18/2015 05:02 PM   Modules accepted: Orders

## 2015-08-18 NOTE — Telephone Encounter (Signed)
Cough medicine refilled.

## 2015-08-18 NOTE — Telephone Encounter (Signed)
Pt call back left msg on triage stating that her she is still coughing up mucus & just a constant cough. The sore throat & ear ache has cleared up just keep coughing. She wanting a refill on the cough syrup...Brittany [name]

## 2015-08-18 NOTE — Telephone Encounter (Signed)
Pt left msg on triage stating came by office yesterday trying to get refill on her cough medicine. Called pt back no answer LMOM RTC...Brittany [name]

## 2015-08-19 NOTE — Telephone Encounter (Signed)
Notified pt rx ready for pick-up.../lmb 

## 2015-08-25 ENCOUNTER — Other Ambulatory Visit: Payer: Medicare Other

## 2015-08-25 ENCOUNTER — Ambulatory Visit (HOSPITAL_BASED_OUTPATIENT_CLINIC_OR_DEPARTMENT_OTHER): Payer: Medicare Other | Admitting: Genetic Counselor

## 2015-08-25 ENCOUNTER — Encounter: Payer: Self-pay | Admitting: Genetic Counselor

## 2015-08-25 DIAGNOSIS — Z315 Encounter for genetic counseling: Secondary | ICD-10-CM | POA: Diagnosis not present

## 2015-08-25 DIAGNOSIS — Z800 Family history of malignant neoplasm of digestive organs: Secondary | ICD-10-CM

## 2015-08-25 DIAGNOSIS — Z803 Family history of malignant neoplasm of breast: Secondary | ICD-10-CM

## 2015-08-25 NOTE — Progress Notes (Signed)
REFERRING PROVIDER: Golden Circle, FNP [place], [place] [address]   Dr. Chalmers Cater  PRIMARY PROVIDER:  Mauricio Po, FNP  PRIMARY REASON FOR VISIT:  1. Family history of breast cancer   2. Family history of pancreatic cancer      HISTORY OF PRESENT ILLNESS:   Brittany [name], a 60 y.o. female, was seen for a [facility] cancer genetics consultation at the request of Dr. Dellis Filbert due to a family history of cancer.  Brittany [name] presents to clinic today to discuss the possibility of a hereditary predisposition to cancer, genetic testing, and to further clarify her future cancer risks, as well as potential cancer risks for family members. Brittany [name] is a 60 y.o. female with no personal history of cancer.    CANCER HISTORY:   No history exists.     HORMONAL RISK FACTORS:  Menarche was at age 48.  First live birth at age 53.  OCP use for approximately 0 years.  Ovaries intact: no.  Hysterectomy: yes.  Menopausal status: postmenopausal.  HRT use: 0 years. Colonoscopy: yes; history of polyps. Mammogram within the last year: yes. Number of breast biopsies: 2. Up to date with pelvic exams:  yes. Any excessive radiation exposure in the past:  no  Past Medical History  Diagnosis Date  . HYPERLIPIDEMIA   . HEMORRHOIDS-INTERNAL   . URI   . OBSTRUCTIVE SLEEP APNEA   . DISORDER, BIPOLAR NEC   . DEPRESSION   . ALOPECIA   . ALLERGIC RHINITIS   . ACNE NEC   . Anal fissure   . Arthritis   . Depression   . Bowel obstruction ([place])   . GERD (gastroesophageal reflux disease)   . Seasonal allergies   . Anxiety     Panic Attack.  None in a long time  . Constipation     drinks tea with senna  . Chronic headaches     migraine- none in years  . Family history of breast cancer   . Family history of pancreatic cancer     Past Surgical History  Procedure Laterality Date  . Knee surgery Bilateral     right x 2 left x 1.  arthroscopy  . Radical hysterectomy    .  Tonsillectomy    . Breast lumpectomy Right last done 2005    x 2  . Thumb and pinkie finger surgery Right 2014  . Eus N/A 01/27/2014    Procedure: ESOPHAGEAL ENDOSCOPIC ULTRASOUND (EUS) RADIAL;  Surgeon: Arta Silence, MD;  Location: [facility];  Service: Endoscopy;  Laterality: N/A;  . Hand surgery Bilateral     for arthritis  left 02/02/14- right approx 2014  . Cholecystectomy N/A 03/07/2014    Procedure: LAPAROSCOPIC CHOLECYSTECTOMY WITH INTRAOPERATIVE CHOLANGIOGRAM;  Surgeon: Fanny Skates, MD;  Location: [facility];  Service: General;  Laterality: N/A;    Social History   Social History  . Marital Status: Married    Spouse Name: N/A  . Number of Children: 4  . Years of Education: N/A   Occupational History  . disabled    Social History Main Topics  . Smoking status: Former Smoker -- 0.10 packs/day for 7 years    Types: Cigarettes    Quit date: 06/19/1981  . Smokeless tobacco: Never Used  . Alcohol Use: No     Comment: rare  . Drug Use: No  . Sexual Activity: Not on file   Other Topics Concern  . Not on file   Social  History Narrative     FAMILY HISTORY:  We obtained a detailed, 4-generation family history.  Significant diagnoses are listed below: Family History  Problem Relation Age of Onset  . Colon cancer Maternal Uncle   . Colon polyps Maternal Uncle   . Other Paternal Aunt     x 2, blood clotting disorder  . Breast cancer Paternal Aunt 69  . Heart disease Sister   . Irritable bowel syndrome Cousin   . Kidney disease Maternal Uncle   . Esophageal cancer Paternal Uncle   . Pancreatic cancer Paternal Uncle   . Rectal cancer Neg Hx   . Stomach cancer Neg Hx   . COPD Father   . Breast cancer Mother 56  . Breast cancer Maternal Grandmother 42  . Stroke Maternal Grandfather   . Aneurysm Paternal Grandmother     brain  . Hypertension Brother   . Breast cancer Maternal Aunt     dx in her 59s  . Breast cancer Maternal Aunt     dx in her 56s  . Cancer  Maternal Uncle     NOS  . Cancer Maternal Uncle     tumor on his head  . Breast cancer Paternal Aunt     dx in her 63s  . Breast cancer Paternal Aunt     dx in her 71s  . Lung cancer Paternal Uncle   . Breast cancer Cousin 46    paternal first cousin    The patient does not have children.  She has one sister and three brothers.  One brother is deceased from HTN complications.  Both parents are deceased.  Her mother died at 6 from breast cancer.  Her mother had one twin sister who died at 69, and two sisters who have breast cancer, one diagnosed in her 59s and the other in her 15s.  The patient's mother had five brothers, one had a cancer NOS, another had liver cancer and a third had a tumor on his head.  The patient's MGM was diagnosed with breast cancer at age 30. The patient's father died of COPD.  HE had four sisters and five brothers, three sisters had breast cancer in their 68s, 46s and 8s.  One brother had pancreatic cancer and died.  This brother had a daughter with breast cancer at 46. Patient's maternal ancestors are of [place] and [place] descent, and paternal ancestors are of [place], Native [place], [place], [place] and [place] descent. There is no reported Ashkenazi Jewish ancestry. There is no known consanguinity.  GENETIC COUNSELING ASSESSMENT: Brittany [name] is a 60 y.o. female with a family history of breast and pancreatic cancer which is somewhat suggestive of a hereditary cancer syndrome and predisposition to cancer. We, therefore, discussed and recommended the following at today's visit.   DISCUSSION: We discussed that about 5-10% of breast cancer is hereditary, with BRCA mutations being the most common cause of hereditary breast cancer.  When thinking about breast and pancreatic cancer, BRCA2 mutations are the most common, but we can also see PALB2 and ATM mutations.  CHEK2 is another common cause of hereditary breast cancer but not associated with pancreatic  cancer.  We reviewed the characteristics, features and inheritance patterns of hereditary cancer syndromes. We also discussed genetic testing, including the appropriate family members to test, the process of testing, insurance coverage and turn-around-time for results. We discussed the implications of a negative, positive and/or variant of uncertain significant result. We recommended Brittany [name] pursue genetic  testing for the Breast/Ovarian cancer gene panel. The Breast/Ovarian gene panel offered by [facility] includes sequencing and rearrangement analysis for the following 20 genes:  ATM, BARD1, BRCA1, BRCA2, BRIP1, CDH1, CHEK2, EPCAM, FANCC, MLH1, MSH2, MSH6, NBN, PALB2, PMS2, PTEN, RAD51C, RAD51D, TP53, and XRCC2.     Based on Brittany [name]'s family history of cancer, she meets medical criteria for genetic testing. Despite that she meets criteria, she may still have an out of pocket cost. We discussed that if her out of pocket cost for testing is over $100, the laboratory will call and confirm whether she wants to proceed with testing.  If the out of pocket cost of testing is less than $100 she will be billed by the genetic testing laboratory.   In order to estimate her chance of having a BRCA mutation, we used statistical models (Tyrer Cusik, Myriad Risk Calculator, [place] [employer]) and laboratory data that take into account her personal medical history, family history and ancestry.  Because each model is different, there can be a lot of variability in the risks they give.  Therefore, these numbers must be considered a rough range and not a precise risk of having a BRCA mutation.  These models estimate that she has approximately a 3.8-10% chance of having a mutation. Based on this assessment of her family and personal history, genetic testing is recommended.  Based on the patient's personal and family history, statistical models (Tyrer Cusik)  and literature data were used to estimate her risk of developing breast cancer.  These estimate her lifetime risk of developing breast cancer to be approximately 46.9%. This estimation does not take into account any genetic testing results.  The patient's lifetime breast cancer risk is a preliminary estimate based on available information using one of several models endorsed by the [place] (ACS). The ACS recommends consideration of breast MRI screening as an adjunct to mammography for patients at high risk (defined as 20% or greater lifetime risk). A more detailed breast cancer risk assessment can be considered, if clinically indicated.   Brittany [name] has been determined to be at high risk for breast cancer.  Therefore, we recommend that annual screening with mammography and breast MRI begin at age 40, or 10 years prior to the age of breast cancer diagnosis in a relative (whichever is earlier).  We discussed that Brittany [name] should discuss her individual situation with her referring physician and determine a breast cancer screening plan with which they are both comfortable.    PLAN: After considering the risks, benefits, and limitations, Brittany [name]  provided informed consent to pursue genetic testing and the blood sample was sent to [facility] for analysis of the Breast/Ovarian cancer panel. Results should be available within approximately 2-3 weeks' time, at which point they will be disclosed by telephone to Brittany [name], as will any additional recommendations warranted by these results. Brittany [name] will receive a summary of her genetic counseling visit and a copy of her results once available. This information will also be available in Epic. We encouraged Brittany [name] to remain in contact with cancer genetics annually so that we can continuously update the family history and inform her of any changes in cancer genetics and testing that may be of benefit for her family. Brittany [name] questions were answered to her satisfaction today. Our contact information was provided  should additional questions or concerns arise.  Lastly, we encouraged Brittany [name] to remain in contact with cancer genetics annually so that we can  continuously update the family history and inform her of any changes in cancer genetics and testing that may be of benefit for this family.   Ms.  [name] questions were answered to her satisfaction today. Our contact information was provided should additional questions or concerns arise. Thank you for the referral and allowing [place] to share in the care of your patient.   Brittany [name], [place], [facility] Certified Genetic Counselor [email or web address] phone: [phone]  The patient was seen for a total of 90 minutes in face-to-face genetic counseling.  This patient was discussed with Drs. Magrinat, Lindi Adie and/or Burr Medico who agrees with the above.    _______________________________________________________________________ For Office Staff:  Number of people involved in session: 1 Was an Intern/ student involved with case: yes

## 2015-09-09 ENCOUNTER — Encounter: Payer: Self-pay | Admitting: Genetic Counselor

## 2015-09-09 ENCOUNTER — Telehealth: Payer: Self-pay | Admitting: Genetic Counselor

## 2015-09-09 DIAGNOSIS — M1711 Unilateral primary osteoarthritis, right knee: Secondary | ICD-10-CM | POA: Insufficient documentation

## 2015-09-09 DIAGNOSIS — M25561 Pain in right knee: Secondary | ICD-10-CM | POA: Diagnosis not present

## 2015-09-09 DIAGNOSIS — Z87891 Personal history of nicotine dependence: Secondary | ICD-10-CM | POA: Diagnosis not present

## 2015-09-09 DIAGNOSIS — Z1379 Encounter for other screening for genetic and chromosomal anomalies: Secondary | ICD-10-CM | POA: Insufficient documentation

## 2015-09-09 NOTE — Telephone Encounter (Signed)
LM with husband that her test results are back.  I will try to call her back at 4 PM.

## 2015-09-10 ENCOUNTER — Telehealth: Payer: Self-pay | Admitting: Genetic Counselor

## 2015-09-10 ENCOUNTER — Ambulatory Visit: Payer: Self-pay | Admitting: Genetic Counselor

## 2015-09-10 DIAGNOSIS — Z86010 Personal history of colonic polyps: Secondary | ICD-10-CM

## 2015-09-10 DIAGNOSIS — Z800 Family history of malignant neoplasm of digestive organs: Secondary | ICD-10-CM

## 2015-09-10 DIAGNOSIS — Z803 Family history of malignant neoplasm of breast: Secondary | ICD-10-CM

## 2015-09-10 DIAGNOSIS — Z1379 Encounter for other screening for genetic and chromosomal anomalies: Secondary | ICD-10-CM

## 2015-09-10 NOTE — Progress Notes (Signed)
HPI: Ms. Angulo was previously seen in the [place] clinic due to a family history of cancer and concerns regarding a hereditary predisposition to cancer. Please refer to our prior cancer genetics clinic note for more information regarding Ms. Brundidge's medical, social and family histories, and our assessment and recommendations, at the time. Ms. Chesmore recent genetic test results were disclosed to her, as were recommendations warranted by these results. These results and recommendations are discussed in more detail below.  FAMILY HISTORY:  We obtained a detailed, 4-generation family history.  Significant diagnoses are listed below: Family History  Problem Relation Age of Onset  . Colon cancer Maternal Uncle   . Colon polyps Maternal Uncle   . Other Paternal Aunt     x 2, blood clotting disorder  . Breast cancer Paternal Aunt 53  . Heart disease Sister   . Irritable bowel syndrome Cousin   . Kidney disease Maternal Uncle   . Esophageal cancer Paternal Uncle   . Pancreatic cancer Paternal Uncle   . Rectal cancer Neg Hx   . Stomach cancer Neg Hx   . COPD Father   . Breast cancer Mother 90  . Breast cancer Maternal Grandmother 20  . Stroke Maternal Grandfather   . Aneurysm Paternal Grandmother     brain  . Hypertension Brother   . Breast cancer Maternal Aunt     dx in her 23s  . Breast cancer Maternal Aunt     dx in her 11s  . Cancer Maternal Uncle     NOS  . Cancer Maternal Uncle     tumor on his head  . Breast cancer Paternal Aunt     dx in her 36s  . Breast cancer Paternal Aunt     dx in her 46s  . Lung cancer Paternal Uncle   . Breast cancer Cousin 19    paternal first cousin    The patient does not have children. She has one sister and three brothers. One brother is deceased from HTN complications. Both parents are deceased. Her mother died at 75 from breast cancer. Her mother had one twin sister who died at 19, and two sisters who have breast  cancer, one diagnosed in her 19s and the other in her 39s. The patient's mother had five brothers, one had a cancer NOS, another had liver cancer and a third had a tumor on his head. The patient's MGM was diagnosed with breast cancer at age 59. The patient's father died of COPD. HE had four sisters and five brothers, three sisters had breast cancer in their 27s, 29s and 85s. One brother had pancreatic cancer and died. This brother had a daughter with breast cancer at 20. Patient's maternal ancestors are of [place] and [place] descent, and paternal ancestors are of [place], Native [place], [place], [place] and [place] descent. There is no reported Ashkenazi Jewish ancestry. There is no known consanguinity.  GENETIC TEST RESULTS: At the time of Ms. Prows's visit, we recommended she pursue genetic testing of the Breast/Ovarian cancer gene panel. The Breast/Ovarian gene panel offered by [facility] includes sequencing and rearrangement analysis for the following 20 genes:  ATM, BARD1, BRCA1, BRCA2, BRIP1, CDH1, CHEK2, EPCAM, FANCC, MLH1, MSH2, MSH6, NBN, PALB2, PMS2, PTEN, RAD51C, RAD51D, TP53, and XRCC2.   The report date is September 07, 2015.  Genetic testing was normal, and did not reveal a deleterious mutation in these genes. The test report has been scanned into EPIC and is  located under the Molecular Pathology section of the Results Review tab.   We discussed with Ms. Fellner that since the current genetic testing is not perfect, it is possible there may be a gene mutation in one of these genes that current testing cannot detect, but that chance is small. We also discussed, that it is possible that another gene that has not yet been discovered, or that we have not yet tested, is responsible for the cancer diagnoses in the family, and it is, therefore, important to remain in touch with cancer genetics in the future so that we can continue to offer Ms. Schlup the most up to date genetic  testing.   CANCER SCREENING RECOMMENDATIONS: Given Ms. Picariello's personal and family histories, we must interpret these negative results with some caution.  Families with features suggestive of hereditary risk for cancer tend to have multiple family members with cancer, diagnoses in multiple generations and diagnoses before the age of 39. Ms. Falkner family exhibits some of these features. Thus this result may simply reflect our current inability to detect all mutations within these genes or there may be a different gene that has not yet been discovered or tested.   Based on the Ms. Mcquain's personal and family history of cancer, as well as her genetic test results, statistical models (Tyrer Cusik)  and literature data were used to estimate her risk of developing breast cancer. These estimate her lifetime risk of developing breast cancer to be approximately 47.3%.  The patient's lifetime breast cancer risk is a preliminary estimate based on available information using one of several models endorsed by the [place] (ACS). The ACS recommends consideration of breast MRI screening as an adjunct to mammography for patients at high risk (defined as 20% or greater lifetime risk). A more detailed breast cancer risk assessment can be considered, if clinically indicated.   Ms. Rog has been determined to be at high risk for breast cancer.  Therefore, we recommend that annual screening with mammography and breast MRI begin at age 71, or 10 years prior to the age of breast cancer diagnosis in a relative (whichever is earlier).  We discussed that Ms. Youse should discuss her individual situation with her referring physician and determine a breast cancer screening plan with which they are both comfortable.    RECOMMENDATIONS FOR FAMILY MEMBERS: Women in this family might be at some increased risk of developing cancer, over the general population risk, simply due to the family history of cancer. We  recommended women in this family have a yearly mammogram beginning at age 62, or 109 years younger than the earliest onset of cancer, an an annual clinical breast exam, and perform monthly breast self-exams. Women in this family should also have a gynecological exam as recommended by their primary provider. All family members should have a colonoscopy by age 42.  Based on Ms. Hineman's family history, we recommended her paternal aunt, who was diagnosed with breast cancer, have genetic counseling and testing. This aunt is in a nursing home, which complicates genetic testing.  Ms. Milius will let us know if we can be of any assistance in coordinating genetic counseling and/or testing for this family member.   FOLLOW-UP: Lastly, we discussed with Ms. Chamorro that cancer genetics is a rapidly advancing field and it is possible that new genetic tests will be appropriate for her and/or her family members in the future. We encouraged her to remain in contact with cancer genetics on an annual basis  so we can update her personal and family histories and let her know of advances in cancer genetics that may benefit this family.   Our contact number was provided. Ms. Nazir questions were answered to her satisfaction, and she knows she is welcome to call us at anytime with additional questions or concerns.   Roma Kayser, MS, [facility] Certified Genetic Counselor [email or web address]

## 2015-09-10 NOTE — Telephone Encounter (Signed)
LM on VM that we had good news on genetic testing.  Asked that she CB. Gave CB number.

## 2015-10-05 ENCOUNTER — Ambulatory Visit (INDEPENDENT_AMBULATORY_CARE_PROVIDER_SITE_OTHER): Payer: Medicare Other | Admitting: Family

## 2015-10-05 ENCOUNTER — Encounter: Payer: Self-pay | Admitting: Family

## 2015-10-05 ENCOUNTER — Other Ambulatory Visit: Payer: Self-pay | Admitting: Family

## 2015-10-05 VITALS — BP 108/68 | HR 56 | Temp 98.0°F | Resp 14 | Ht 62.0 in | Wt 197.0 lb

## 2015-10-05 DIAGNOSIS — M25512 Pain in left shoulder: Secondary | ICD-10-CM | POA: Diagnosis not present

## 2015-10-05 NOTE — Progress Notes (Signed)
Subjective:    Patient ID: Brittany [name], female    DOB: 02-19-1956, 60 y.o.   MRN: [id number]  Chief Complaint  Patient presents with  . Shoulder Pain    left shoulder pain, in the process of moving, felt like someone was pulling the muscle and twisting it    HPI:  Brittany [name] is a 60 y.o. female who  has a past medical history of HYPERLIPIDEMIA; HEMORRHOIDS-INTERNAL; URI; OBSTRUCTIVE SLEEP APNEA; DISORDER, BIPOLAR NEC; DEPRESSION; ALOPECIA; ALLERGIC RHINITIS; ACNE NEC; Anal fissure; Arthritis; Depression; Bowel obstruction ([place]); GERD (gastroesophageal reflux disease); Seasonal allergies; Anxiety; Constipation; Chronic headaches; Family history of breast cancer; and Family history of pancreatic cancer. and presents today for an acute visit.   This is a new problem. Associated symptom of pain located in her left shoulder has been going on for about 2 years but worsening lately. Pain is described as feeling like her shoulder is being twisted deep inside. Unable to lay on her left side. Endorses difficulty with picking things up. Modifying factors include Biofreeze and ibuprofen which has helped improve her symptoms but not the course. No trauma that she can recall. There is no numbness and tingling. Severity of the pain disturbs her sleep pattern.    Allergies  Allergen Reactions  . Flexeril [Cyclobenzaprine]     Unable to leave home when taking as she is incoherent/unsure where she is at times  . Hydrocodone     REACTION: GI upset     Current Outpatient Prescriptions on File Prior to Visit  Medication Sig Dispense Refill  . aspirin 325 MG tablet Take 325 mg by mouth once.    Marland Kitchen buPROPion (WELLBUTRIN XL) 300 MG 24 hr tablet Take 300 mg by mouth every morning.    . Carbamazepine (EQUETRO) 300 MG CP12 Take 600 mg by mouth at bedtime.     . cetirizine (ZYRTEC) 10 MG tablet Take 10 mg by mouth daily.    . diclofenac sodium (VOLTAREN) 1 % GEL Apply 2 g topically 4 (four) times daily.  100 g 1  . ibuprofen (ADVIL,MOTRIN) 400 MG tablet Take 2 tablets by mouth as needed.    . lamoTRIgine (LAMICTAL) 150 MG tablet Take 300 mg by mouth daily.     Marland Kitchen levofloxacin (LEVAQUIN) 500 MG tablet Take 1 tablet (500 mg total) by mouth daily. 10 tablet 0  . meloxicam (MOBIC) 15 MG tablet Take 1 tablet (15 mg total) by mouth daily. 30 tablet 3  . methocarbamol (ROBAXIN) 500 MG tablet Take 1 tablet (500 mg total) by mouth every 8 (eight) hours as needed for muscle spasms. 90 tablet 0  . Multiple Vitamin (MULTIVITAMIN WITH MINERALS) TABS Take 1 tablet by mouth daily.    Marland Kitchen oxyCODONE-acetaminophen (PERCOCET) 10-325 MG per tablet Take 1 tablet by mouth every 8 (eight) hours as needed for pain. 10 tablet 0  . promethazine-codeine (PHENERGAN WITH CODEINE) 6.25-10 MG/5ML syrup Take 5 mLs by mouth every 4 (four) hours as needed for cough. 180 mL 0  . sertraline (ZOLOFT) 50 MG tablet Take 50 mg by mouth every morning.      Current Facility-Administered Medications on File Prior to Visit  Medication Dose Route Frequency Provider Last Rate Last Dose  . methylPREDNISolone acetate (DEPO-MEDROL) injection 80 mg  80 mg Intra-articular Once Kennyth Arnold, FNP         Past Surgical History  Procedure Laterality Date  . Knee surgery Bilateral     right x 2 left  x 1.  arthroscopy  . Radical hysterectomy    . Tonsillectomy    . Breast lumpectomy Right last done 2005    x 2  . Thumb and pinkie finger surgery Right 2014  . Eus N/A 01/27/2014    Procedure: ESOPHAGEAL ENDOSCOPIC ULTRASOUND (EUS) RADIAL;  Surgeon: Arta Silence, MD;  Location: [facility];  Service: Endoscopy;  Laterality: N/A;  . Hand surgery Bilateral     for arthritis  left 02/02/14- right approx 2014  . Cholecystectomy N/A 03/07/2014    Procedure: LAPAROSCOPIC CHOLECYSTECTOMY WITH INTRAOPERATIVE CHOLANGIOGRAM;  Surgeon: Fanny Skates, MD;  Location: [facility];  Service: General;  Laterality: N/A;    Past Medical History  Diagnosis Date  .  HYPERLIPIDEMIA   . HEMORRHOIDS-INTERNAL   . URI   . OBSTRUCTIVE SLEEP APNEA   . DISORDER, BIPOLAR NEC   . DEPRESSION   . ALOPECIA   . ALLERGIC RHINITIS   . ACNE NEC   . Anal fissure   . Arthritis   . Depression   . Bowel obstruction ([place])   . GERD (gastroesophageal reflux disease)   . Seasonal allergies   . Anxiety     Panic Attack.  None in a long time  . Constipation     drinks tea with senna  . Chronic headaches     migraine- none in years  . Family history of breast cancer   . Family history of pancreatic cancer      Review of Systems  Constitutional: Negative for fever and chills.  Musculoskeletal:       Positive for left shoulder pain.   Neurological: Negative for weakness and numbness.      Objective:    BP 108/68 mmHg  Pulse 56  Temp(Src) 98 F (36.7 C) (Oral)  Resp 14  Ht 5\' 2"  (1.575 m)  Wt 197 lb (89.359 kg)  BMI 36.02 kg/m2  SpO2 98% Nursing note and vital signs reviewed.  Physical Exam  Constitutional: She is oriented to person, place, and time. She appears well-developed and well-nourished. No distress.  Cardiovascular: Normal rate, regular rhythm, normal heart sounds and intact distal pulses.   Pulmonary/Chest: Effort normal and breath sounds normal.  Musculoskeletal:  Left shoulder - no obvious deformity, discoloration, or edema. Tenderness elicited over subacromial space and supraspinatus tendon. Range of motion is slightly limited above 120 in flexion and normal and abduction. Strength is 3-4+ with patient discomfort noted in flexion and abduction. Positive empty can. Positive impingement. Distal pulses and sensation are intact and appropriate.  Neurological: She is alert and oriented to person, place, and time.  Skin: Skin is warm and dry.  Psychiatric: She has a normal mood and affect. Her behavior is normal. Judgment and thought content normal.   Procedure: Left shoulder joint injection.  Description: Informed consent was obtained with  discussion including risks and benefits of the procedure. Patient verbally wished to continued. Time out was performed. The left shoulder was marked and identified. It was cleansed with betadine using a concentric circular pattern. Skin anesthesia was applied using cold spray applied for 10 seconds. Injection of 1:4 of Kenalog to Sensoricaine was injected following aspiration with no return noted. Following the injection there was decreased pain confirming appropriate placement. A bandage was applied to the area and post care instructions were provided. The procedure was tolerated well with no complications.      Assessment & Plan:   Problem List Items Addressed This Visit      Other  Left shoulder pain - Primary    Left shoulder pain consistent with possible rotator cuff tear. Cortisone injection provided today. Obtain MRI. Treat conservatively with ice and home exercise therapy. Follow-up pending MRI results.      Relevant Orders   MR Shoulder Left Wo Contrast       I am having Ms. Netter maintain her lamoTRIgine, sertraline, multivitamin with minerals, cetirizine, Carbamazepine, buPROPion, aspirin, methocarbamol, diclofenac sodium, meloxicam, ibuprofen, oxyCODONE-acetaminophen, levofloxacin, and promethazine-codeine.   Follow-up: Return for Pending MRI results.  Mauricio Po, FNP

## 2015-10-05 NOTE — Assessment & Plan Note (Signed)
Left shoulder pain consistent with possible rotator cuff tear. Cortisone injection provided today. Obtain MRI. Treat conservatively with ice and home exercise therapy. Follow-up pending MRI results.

## 2015-10-05 NOTE — Patient Instructions (Signed)
Thank you for choosing [facility].  Summary/Instructions:  Ice 2-3 times per day and after activity. Decrease shoulder activity for the next 24 hours. Exercises daily. They will call to schedule your MRI.  If your symptoms worsen or fail to improve, please contact our office for further instruction, or in case of emergency go directly to the emergency room at the closest medical facility.   Impingement Syndrome, Rotator Cuff, Bursitis With Rehab Impingement syndrome is a condition that involves inflammation of the tendons of the rotator cuff and the subacromial bursa, that causes pain in the shoulder. The rotator cuff consists of four tendons and muscles that control much of the shoulder and upper arm function. The subacromial bursa is a fluid filled sac that helps reduce friction between the rotator cuff and one of the bones of the shoulder (acromion). Impingement syndrome is usually an overuse injury that causes swelling of the bursa (bursitis), swelling of the tendon (tendonitis), and/or a tear of the tendon (strain). Strains are classified into three categories. Grade 1 strains cause pain, but the tendon is not lengthened. Grade 2 strains include a lengthened ligament, due to the ligament being stretched or partially ruptured. With grade 2 strains there is still function, although the function may be decreased. Grade 3 strains include a complete tear of the tendon or muscle, and function is usually impaired. SYMPTOMS   Pain around the shoulder, often at the outer portion of the upper arm.  Pain that gets worse with shoulder function, especially when reaching overhead or lifting.  Sometimes, aching when not using the arm.  Pain that wakes you up at night.  Sometimes, tenderness, swelling, warmth, or redness over the affected area.  Loss of strength.  Limited motion of the shoulder, especially reaching behind the back (to the back pocket or to unhook bra) or across your  body.  Crackling sound (crepitation) when moving the arm.  Biceps tendon pain and inflammation (in the front of the shoulder). Worse when bending the elbow or lifting. CAUSES  Impingement syndrome is often an overuse injury, in which chronic (repetitive) motions cause the tendons or bursa to become inflamed. A strain occurs when a force is paced on the tendon or muscle that is greater than it can withstand. Common mechanisms of injury include: Stress from sudden increase in duration, frequency, or intensity of training.  Direct hit (trauma) to the shoulder.  Aging, erosion of the tendon with normal use.  Bony bump on shoulder (acromial spur). RISK INCREASES WITH:  Contact sports (football, wrestling, boxing).  Throwing sports (baseball, tennis, volleyball).  Weightlifting and bodybuilding.  Heavy labor.  Previous injury to the rotator cuff, including impingement.  Poor shoulder strength and flexibility.  Failure to warm up properly before activity.  Inadequate protective equipment.  Old age.  Bony bump on shoulder (acromial spur). PREVENTION   Warm up and stretch properly before activity.  Allow for adequate recovery between workouts.  Maintain physical fitness:  Strength, flexibility, and endurance.  Cardiovascular fitness.  Learn and use proper exercise technique. PROGNOSIS  If treated properly, impingement syndrome usually goes away within 6 weeks. Sometimes surgery is required.  RELATED COMPLICATIONS   Longer healing time if not properly treated, or if not given enough time to heal.  Recurring symptoms, that result in a chronic condition.  Shoulder stiffness, frozen shoulder, or loss of motion.  Rotator cuff tendon tear.  Recurring symptoms, especially if activity is resumed too soon, with overuse, with a direct blow, or when  using poor technique. TREATMENT  Treatment first involves the use of ice and medicine, to reduce pain and inflammation. The use  of strengthening and stretching exercises may help reduce pain with activity. These exercises may be performed at home or with a therapist. If non-surgical treatment is unsuccessful after more than 6 months, surgery may be advised. After surgery and rehabilitation, activity is usually possible in 3 months.  MEDICATION  If pain medicine is needed, nonsteroidal anti-inflammatory medicines (aspirin and ibuprofen), or other minor pain relievers (acetaminophen), are often advised.  Do not take pain medicine for 7 days before surgery.  Prescription pain relievers may be given, if your caregiver thinks they are needed. Use only as directed and only as much as you need.  Corticosteroid injections may be given by your caregiver. These injections should be reserved for the most serious cases, because they may only be given a certain number of times. HEAT AND COLD  Cold treatment (icing) should be applied for 10 to 15 minutes every 2 to 3 hours for inflammation and pain, and immediately after activity that aggravates your symptoms. Use ice packs or an ice massage.  Heat treatment may be used before performing stretching and strengthening activities prescribed by your caregiver, physical therapist, or athletic trainer. Use a heat pack or a warm water soak. SEEK MEDICAL CARE IF:   Symptoms get worse or do not improve in 4 to 6 weeks, despite treatment.  New, unexplained symptoms develop. (Drugs used in treatment may produce side effects.) EXERCISES  RANGE OF MOTION (ROM) AND STRETCHING EXERCISES - Impingement Syndrome (Rotator Cuff  Tendinitis, Bursitis) These exercises may help you when beginning to rehabilitate your injury. Your symptoms may go away with or without further involvement from your physician, physical therapist or athletic trainer. While completing these exercises, remember:   Restoring tissue flexibility helps normal motion to return to the joints. This allows healthier, less painful  movement and activity.  An effective stretch should be held for at least 30 seconds.  A stretch should never be painful. You should only feel a gentle lengthening or release in the stretched tissue. STRETCH - Flexion, Standing  Stand with good posture. With an underhand grip on your right / left hand, and an overhand grip on the opposite hand, grasp a broomstick or cane so that your hands are a [place] more than shoulder width apart.  Keeping your right / left elbow straight and shoulder muscles relaxed, push the stick with your opposite hand, to raise your right / left arm in front of your body and then overhead. Raise your arm until you feel a stretch in your right / left shoulder, but before you have increased shoulder pain.  Try to avoid shrugging your right / left shoulder as your arm rises, by keeping your shoulder blade tucked down and toward your mid-back spine. Hold for __________ seconds.  Slowly return to the starting position. Repeat __________ times. Complete this exercise __________ times per day. STRETCH - Abduction, Supine  Lie on your back. With an underhand grip on your right / left hand and an overhand grip on the opposite hand, grasp a broomstick or cane so that your hands are a [place] more than shoulder width apart.  Keeping your right / left elbow straight and your shoulder muscles relaxed, push the stick with your opposite hand, to raise your right / left arm out to the side of your body and then overhead. Raise your arm until you feel a stretch  in your right / left shoulder, but before you have increased shoulder pain.  Try to avoid shrugging your right / left shoulder as your arm rises, by keeping your shoulder blade tucked down and toward your mid-back spine. Hold for __________ seconds.  Slowly return to the starting position. Repeat __________ times. Complete this exercise __________ times per day. ROM - Flexion, Active-Assisted  Lie on your back. You may bend  your knees for comfort.  Grasp a broomstick or cane so your hands are about shoulder width apart. Your right / left hand should grip the end of the stick, so that your hand is positioned "thumbs-up," as if you were about to shake hands.  Using your healthy arm to lead, raise your right / left arm overhead, until you feel a gentle stretch in your shoulder. Hold for __________ seconds.  Use the stick to assist in returning your right / left arm to its starting position. Repeat __________ times. Complete this exercise __________ times per day.  ROM - Internal Rotation, Supine   Lie on your back on a firm surface. Place your right / left elbow about 60 degrees away from your side. Elevate your elbow with a folded towel, so that the elbow and shoulder are the same height.  Using a broomstick or cane and your strong arm, pull your right / left hand toward your body until you feel a gentle stretch, but no increase in your shoulder pain. Keep your shoulder and elbow in place throughout the exercise.  Hold for __________ seconds. Slowly return to the starting position. Repeat __________ times. Complete this exercise __________ times per day. STRETCH - Internal Rotation  Place your right / left hand behind your back, palm up.  Throw a towel or belt over your opposite shoulder. Grasp the towel with your right / left hand.  While keeping an upright posture, gently pull up on the towel, until you feel a stretch in the front of your right / left shoulder.  Avoid shrugging your right / left shoulder as your arm rises, by keeping your shoulder blade tucked down and toward your mid-back spine.  Hold for __________ seconds. Release the stretch, by lowering your healthy hand. Repeat __________ times. Complete this exercise __________ times per day. ROM - Internal Rotation   Using an underhand grip, grasp a stick behind your back with both hands.  While standing upright with good posture, slide the stick  up your back until you feel a mild stretch in the front of your shoulder.  Hold for __________ seconds. Slowly return to your starting position. Repeat __________ times. Complete this exercise __________ times per day.  STRETCH - Posterior Shoulder Capsule   Stand or sit with good posture. Grasp your right / left elbow and draw it across your chest, keeping it at the same height as your shoulder.  Pull your elbow, so your upper arm comes in closer to your chest. Pull until you feel a gentle stretch in the back of your shoulder.  Hold for __________ seconds. Repeat __________ times. Complete this exercise __________ times per day. STRENGTHENING EXERCISES - Impingement Syndrome (Rotator Cuff Tendinitis, Bursitis) These exercises may help you when beginning to rehabilitate your injury. They may resolve your symptoms with or without further involvement from your physician, physical therapist or athletic trainer. While completing these exercises, remember:  Muscles can gain both the endurance and the strength needed for everyday activities through controlled exercises.  Complete these exercises as instructed by your  physician, physical therapist or athletic trainer. Increase the resistance and repetitions only as guided.  You may experience muscle soreness or fatigue, but the pain or discomfort you are trying to eliminate should never worsen during these exercises. If this pain does get worse, stop and make sure you are following the directions exactly. If the pain is still present after adjustments, discontinue the exercise until you can discuss the trouble with your clinician.  During your recovery, avoid activity or exercises which involve actions that place your injured hand or elbow above your head or behind your back or head. These positions stress the tissues which you are trying to heal. STRENGTH - Scapular Depression and Adduction   With good posture, sit on a firm chair. Support your  arms in front of you, with pillows, arm rests, or on a table top. Have your elbows in line with the sides of your body.  Gently draw your shoulder blades down and toward your mid-back spine. Gradually increase the tension, without tensing the muscles along the top of your shoulders and the back of your neck.  Hold for __________ seconds. Slowly release the tension and relax your muscles completely before starting the next repetition.  After you have practiced this exercise, remove the arm support and complete the exercise in standing as well as sitting position. Repeat __________ times. Complete this exercise __________ times per day.  STRENGTH - Shoulder Abductors, Isometric  With good posture, stand or sit about 4-6 inches from a wall, with your right / left side facing the wall.  Bend your right / left elbow. Gently press your right / left elbow into the wall. Increase the pressure gradually, until you are pressing as hard as you can, without shrugging your shoulder or increasing any shoulder discomfort.  Hold for __________ seconds.  Release the tension slowly. Relax your shoulder muscles completely before you begin the next repetition. Repeat __________ times. Complete this exercise __________ times per day.  STRENGTH - External Rotators, Isometric  Keep your right / left elbow at your side and bend it 90 degrees.  Step into a door frame so that the outside of your right / left wrist can press against the door frame without your upper arm leaving your side.  Gently press your right / left wrist into the door frame, as if you were trying to swing the back of your hand away from your stomach. Gradually increase the tension, until you are pressing as hard as you can, without shrugging your shoulder or increasing any shoulder discomfort.  Hold for __________ seconds.  Release the tension slowly. Relax your shoulder muscles completely before you begin the next repetition. Repeat  __________ times. Complete this exercise __________ times per day.  STRENGTH - Supraspinatus   Stand or sit with good posture. Grasp a __________ weight, or an exercise band or tubing, so that your hand is "thumbs-up," like you are shaking hands.  Slowly lift your right / left arm in a "V" away from your thigh, diagonally into the space between your side and straight ahead. Lift your hand to shoulder height or as far as you can, without increasing any shoulder pain. At first, many people do not lift their hands above shoulder height.  Avoid shrugging your right / left shoulder as your arm rises, by keeping your shoulder blade tucked down and toward your mid-back spine.  Hold for __________ seconds. Control the descent of your hand, as you slowly return to your starting position. Repeat  __________ times. Complete this exercise __________ times per day.  STRENGTH - External Rotators  Secure a rubber exercise band or tubing to a fixed object (table, pole) so that it is at the same height as your right / left elbow when you are standing or sitting on a firm surface.  Stand or sit so that the secured exercise band is at your uninjured side.  Bend your right / left elbow 90 degrees. Place a folded towel or small pillow under your right / left arm, so that your elbow is a few inches away from your side.  Keeping the tension on the exercise band, pull it away from your body, as if pivoting on your elbow. Be sure to keep your body steady, so that the movement is coming only from your rotating shoulder.  Hold for __________ seconds. Release the tension in a controlled manner, as you return to the starting position. Repeat __________ times. Complete this exercise __________ times per day.  STRENGTH - Internal Rotators   Secure a rubber exercise band or tubing to a fixed object (table, pole) so that it is at the same height as your right / left elbow when you are standing or sitting on a firm  surface.  Stand or sit so that the secured exercise band is at your right / left side.  Bend your elbow 90 degrees. Place a folded towel or small pillow under your right / left arm so that your elbow is a few inches away from your side.  Keeping the tension on the exercise band, pull it across your body, toward your stomach. Be sure to keep your body steady, so that the movement is coming only from your rotating shoulder.  Hold for __________ seconds. Release the tension in a controlled manner, as you return to the starting position. Repeat __________ times. Complete this exercise __________ times per day.  STRENGTH - Scapular Protractors, Standing   Stand arms length away from a wall. Place your hands on the wall, keeping your elbows straight.  Begin by dropping your shoulder blades down and toward your mid-back spine.  To strengthen your protractors, keep your shoulder blades down, but slide them forward on your rib cage. It will feel as if you are lifting the back of your rib cage away from the wall. This is a subtle motion and can be challenging to complete. Ask your caregiver for further instruction, if you are not sure you are doing the exercise correctly.  Hold for __________ seconds. Slowly return to the starting position, resting the muscles completely before starting the next repetition. Repeat __________ times. Complete this exercise __________ times per day. STRENGTH - Scapular Protractors, Supine  Lie on your back on a firm surface. Extend your right / left arm straight into the air while holding a __________ weight in your hand.  Keeping your head and back in place, lift your shoulder off the floor.  Hold for __________ seconds. Slowly return to the starting position, and allow your muscles to relax completely before starting the next repetition. Repeat __________ times. Complete this exercise __________ times per day. STRENGTH - Scapular Protractors, Quadruped  Get onto  your hands and knees, with your shoulders directly over your hands (or as close as you can be, comfortably).  Keeping your elbows locked, lift the back of your rib cage up into your shoulder blades, so your mid-back rounds out. Keep your neck muscles relaxed.  Hold this position for __________ seconds. Slowly return to  the starting position and allow your muscles to relax completely before starting the next repetition. Repeat __________ times. Complete this exercise __________ times per day.  STRENGTH - Scapular Retractors  Secure a rubber exercise band or tubing to a fixed object (table, pole), so that it is at the height of your shoulders when you are either standing, or sitting on a firm armless chair.  With a palm down grip, grasp an end of the band in each hand. Straighten your elbows and lift your hands straight in front of you, at shoulder height. Step back, away from the secured end of the band, until it becomes tense.  Squeezing your shoulder blades together, draw your elbows back toward your sides, as you bend them. Keep your upper arms lifted away from your body throughout the exercise.  Hold for __________ seconds. Slowly ease the tension on the band, as you reverse the directions and return to the starting position. Repeat __________ times. Complete this exercise __________ times per day. STRENGTH - Shoulder Extensors   Secure a rubber exercise band or tubing to a fixed object (table, pole) so that it is at the height of your shoulders when you are either standing, or sitting on a firm armless chair.  With a thumbs-up grip, grasp an end of the band in each hand. Straighten your elbows and lift your hands straight in front of you, at shoulder height. Step back, away from the secured end of the band, until it becomes tense.  Squeezing your shoulder blades together, pull your hands down to the sides of your thighs. Do not allow your hands to go behind you.  Hold for __________  seconds. Slowly ease the tension on the band, as you reverse the directions and return to the starting position. Repeat __________ times. Complete this exercise __________ times per day.  STRENGTH - Scapular Retractors and External Rotators   Secure a rubber exercise band or tubing to a fixed object (table, pole) so that it is at the height as your shoulders, when you are either standing, or sitting on a firm armless chair.  With a palm down grip, grasp an end of the band in each hand. Bend your elbows 90 degrees and lift your elbows to shoulder height, at your sides. Step back, away from the secured end of the band, until it becomes tense.  Squeezing your shoulder blades together, rotate your shoulders so that your upper arms and elbows remain stationary, but your fists travel upward to head height.  Hold for __________ seconds. Slowly ease the tension on the band, as you reverse the directions and return to the starting position. Repeat __________ times. Complete this exercise __________ times per day.  STRENGTH - Scapular Retractors and External Rotators, Rowing   Secure a rubber exercise band or tubing to a fixed object (table, pole) so that it is at the height of your shoulders, when you are either standing, or sitting on a firm armless chair.  With a palm down grip, grasp an end of the band in each hand. Straighten your elbows and lift your hands straight in front of you, at shoulder height. Step back, away from the secured end of the band, until it becomes tense.  Step 1: Squeeze your shoulder blades together. Bending your elbows, draw your hands to your chest, as if you are rowing a boat. At the end of this motion, your hands and elbow should be at shoulder height and your elbows should be out to your  sides.  Step 2: Rotate your shoulders, to raise your hands above your head. Your forearms should be vertical and your upper arms should be horizontal.  Hold for __________ seconds. Slowly  ease the tension on the band, as you reverse the directions and return to the starting position. Repeat __________ times. Complete this exercise __________ times per day.  STRENGTH - Scapular Depressors  Find a sturdy chair without wheels, such as a dining room chair.  Keeping your feet on the floor, and your hands on the chair arms, lift your bottom up from the seat, and lock your elbows.  Keeping your elbows straight, allow gravity to pull your body weight down. Your shoulders will rise toward your ears.  Raise your body against gravity by drawing your shoulder blades down your back, shortening the distance between your shoulders and ears. Although your feet should always maintain contact with the floor, your feet should progressively support less body weight, as you get stronger.  Hold for __________ seconds. In a controlled and slow manner, lower your body weight to begin the next repetition. Repeat __________ times. Complete this exercise __________ times per day.    This information is not intended to replace advice given to you by your health care provider. Make sure you discuss any questions you have with your health care provider.   Document Released: 06/05/2005 Document Revised: 06/26/2014 Document Reviewed: 09/17/2008 Elsevier Interactive Patient Education [employer].

## 2015-10-12 ENCOUNTER — Telehealth: Payer: Self-pay | Admitting: Family

## 2015-10-12 ENCOUNTER — Ambulatory Visit
Admission: RE | Admit: 2015-10-12 | Discharge: 2015-10-12 | Disposition: A | Discharge status: Home / Self Care | Payer: Medicare Other | Source: Ambulatory Visit | Attending: Family | Admitting: Family

## 2015-10-12 DIAGNOSIS — M25512 Pain in left shoulder: Secondary | ICD-10-CM

## 2015-10-12 DIAGNOSIS — M75111 Incomplete rotator cuff tear or rupture of right shoulder, not specified as traumatic: Secondary | ICD-10-CM | POA: Diagnosis not present

## 2015-10-12 NOTE — Telephone Encounter (Signed)
Spoke with patient via phone with results and plan given. All questions answered.

## 2015-10-12 NOTE — Telephone Encounter (Signed)
Please inform patient that her MRI shows a tear of her rotator cuff. The good news is that is not a full thickness tear and therefore may improve without surgery. I have sent a referral to physical therapy. If this fails, we will send her to orthopedics for further evaluation.

## 2016-01-03 ENCOUNTER — Other Ambulatory Visit: Payer: Self-pay | Admitting: Family

## 2016-01-12 DIAGNOSIS — R740 Nonspecific elevation of levels of transaminase and lactic acid dehydrogenase [LDH]: Secondary | ICD-10-CM | POA: Diagnosis not present

## 2016-01-13 ENCOUNTER — Telehealth: Payer: Self-pay | Admitting: Internal Medicine

## 2016-01-13 DIAGNOSIS — L309 Dermatitis, unspecified: Secondary | ICD-10-CM | POA: Diagnosis not present

## 2016-01-13 DIAGNOSIS — L219 Seborrheic dermatitis, unspecified: Secondary | ICD-10-CM | POA: Diagnosis not present

## 2016-01-13 DIAGNOSIS — K130 Diseases of lips: Secondary | ICD-10-CM | POA: Diagnosis not present

## 2016-01-13 NOTE — Telephone Encounter (Signed)
Sorry CY is not in the office during that time; pt can be seen by NP. Thanks.

## 2016-01-13 NOTE — Telephone Encounter (Signed)
I called spoke with pt. She had to cancel her follow up on 01/27/16. She is requesting a work in appointment end of august 8/29-9/4. Please advise Joellen Jersey thanks.

## 2016-01-13 NOTE — Telephone Encounter (Signed)
Called spoke with pt. appt scheduled to see TP. Nothing further needed

## 2016-01-14 DIAGNOSIS — F3170 Bipolar disorder, currently in remission, most recent episode unspecified: Secondary | ICD-10-CM | POA: Diagnosis not present

## 2016-01-27 ENCOUNTER — Ambulatory Visit: Payer: Medicare Other | Admitting: Internal Medicine

## 2016-02-15 ENCOUNTER — Ambulatory Visit (INDEPENDENT_AMBULATORY_CARE_PROVIDER_SITE_OTHER): Payer: Medicare Other | Admitting: Adult Health

## 2016-02-15 ENCOUNTER — Encounter: Payer: Self-pay | Admitting: Adult Health

## 2016-02-15 DIAGNOSIS — J309 Allergic rhinitis, unspecified: Secondary | ICD-10-CM

## 2016-02-15 DIAGNOSIS — K625 Hemorrhage of anus and rectum: Secondary | ICD-10-CM | POA: Diagnosis not present

## 2016-02-15 DIAGNOSIS — G4733 Obstructive sleep apnea (adult) (pediatric): Secondary | ICD-10-CM

## 2016-02-15 DIAGNOSIS — K219 Gastro-esophageal reflux disease without esophagitis: Secondary | ICD-10-CM | POA: Diagnosis not present

## 2016-02-15 DIAGNOSIS — Z800 Family history of malignant neoplasm of digestive organs: Secondary | ICD-10-CM | POA: Diagnosis not present

## 2016-02-15 DIAGNOSIS — R748 Abnormal levels of other serum enzymes: Secondary | ICD-10-CM | POA: Diagnosis not present

## 2016-02-15 MED ORDER — CETIRIZINE HCL 10 MG PO TABS: 10.0000 mg | ORAL_TABLET | Freq: Every day | ORAL | 5 refills | Status: DC

## 2016-02-15 MED ORDER — MOMETASONE FUROATE 50 MCG/ACT NA SUSP: 2.0000 | Freq: Every day | NASAL | 5 refills | Status: DC

## 2016-02-15 MED ORDER — AZELASTINE HCL 0.1 % NA SOLN: 2.0000 | Freq: Two times a day (BID) | NASAL | 5 refills | Status: DC

## 2016-02-15 NOTE — Addendum Note (Signed)
Addended by: Osa Craver on: 02/15/2016 10:59 AM   Modules accepted: Orders

## 2016-02-15 NOTE — Patient Instructions (Addendum)
Begin Nasonex 2 puffs in am  Continue on Astelin Nasal 2 puffs At bedtime   Begin saline nasal spray 2-3 times a day  Restart Zyrtec 10mg  At bedtime   Use Saline nasal gel At bedtime   Continue on CPAP At bedtime  .  Do not drive if sleepy  Please contact office for sooner follow up if symptoms do not improve or worsen or seek emergency care  follow up Dr. Annamaria Boots  In 1 year and As needed

## 2016-02-15 NOTE — Progress Notes (Signed)
Subjective:    Patient ID: Brittany [name], female    DOB: [birth year], 60 y.o.   MRN: [id number]  HPI 60 year old female former smoker followed for obstructive sleep apnea on CPAP   02/15/2016 Follow up:  OSA  Returns for a one-year follow-up for sleep apnea. Patient remains on C Pap at bedtime. Patient says she is doing well on C Pap wears it on average of 8 hours each night. She denies any significant daytime sleepiness. Patient denies chest pain, orthopnea, PND, or increased leg swelling Her download is available at today's visit.   Past Medical History:  Diagnosis Date  . ACNE NEC   . ALLERGIC RHINITIS   . ALOPECIA   . Anal fissure   . Anxiety    Panic Attack.  None in a long time  . Arthritis   . Bowel obstruction ([place])   . Chronic headaches    migraine- none in years  . Constipation    drinks tea with senna  . DEPRESSION   . Depression   . DISORDER, BIPOLAR NEC   . Family history of breast cancer   . Family history of pancreatic cancer   . GERD (gastroesophageal reflux disease)   . HEMORRHOIDS-INTERNAL   . HYPERLIPIDEMIA   . OBSTRUCTIVE SLEEP APNEA   . Seasonal allergies   . URI        Review of Systems Constitutional:   No  weight loss, night sweats,  Fevers, chills,  +fatigue, or  lassitude.  HEENT:   No headaches,  Difficulty swallowing,  Tooth/dental problems, or  Sore throat,                No sneezing, itching, ear ache, nasal congestion, post nasal drip,   CV:  No chest pain,  Orthopnea, PND, swelling in lower extremities, anasarca, dizziness, palpitations, syncope.   GI  No heartburn, indigestion, abdominal pain, nausea, vomiting, diarrhea, change in bowel habits, loss of appetite, bloody stools.   Resp: No shortness of breath with exertion or at rest.  No excess mucus, no productive cough,  No non-productive cough,  No coughing up of blood.  No change in color of mucus.  No wheezing.  No chest wall deformity  Skin: no rash or lesions.  GU: no  dysuria, change in color of urine, no urgency or frequency.  No flank pain, no hematuria   MS:  No joint pain or swelling.  No decreased range of motion.  No back pain.  Psych:  No change in mood or affect. No depression or anxiety.  No memory loss.         Objective:   Physical Exam Vitals:   02/15/16 1023  BP: 106/74  Pulse: 65  Temp: 98 F (36.7 C)  TempSrc: Oral  SpO2: 98%  Weight: 188 lb (85.3 kg)  Height: 5\' 2"  (1.575 m)  Body mass index is 34.39 kg/m.   GEN: A/Ox3; pleasant , NAD   HEENT:  [place]/AT,  EACs-clear, TMs-wnl, NOSE-clear drainage, turbinate edema, pale mucosa. , THROAT-clear, no lesions, no postnasal drip or exudate noted. Class 3 MP airway   NECK:  Supple w/ fair ROM; no JVD; normal carotid impulses w/o bruits; no thyromegaly or nodules palpated; no lymphadenopathy.    RESP  Clear  P & A; w/o, wheezes/ rales/ or rhonchi. no accessory muscle use, no dullness to percussion  CARD:  RRR, no m/r/g  , no peripheral edema, pulses intact, no cyanosis or clubbing.  GI:   Soft &  nt; nml bowel sounds; no organomegaly or masses detected.   Musco: Warm bil, no deformities or joint swelling noted.   Neuro: alert, no focal deficits noted.    Skin: Warm, no lesions or rashes  Tresten Pantoja NP-C  [facility]  02/15/2016

## 2016-02-15 NOTE — Assessment & Plan Note (Signed)
Doing well on CPAP . Download requested   Plan  Patient Instructions  Begin Nasonex 2 puffs in am  Continue on Astelin Nasal 2 puffs At bedtime   Begin saline nasal spray 2-3 times a day  Restart Zyrtec 10mg  At bedtime   Use Saline nasal gel At bedtime   Continue on CPAP At bedtime  .  Do not drive if sleepy  Please contact office for sooner follow up if symptoms do not improve or worsen or seek emergency care  follow up Dr. Annamaria Boots  In 1 year and As needed

## 2016-02-15 NOTE — Assessment & Plan Note (Signed)
Flare   Plan  Patient Instructions  Begin Nasonex 2 puffs in am  Continue on Astelin Nasal 2 puffs At bedtime   Begin saline nasal spray 2-3 times a day  Restart Zyrtec 10mg  At bedtime   Use Saline nasal gel At bedtime   Continue on CPAP At bedtime  .  Do not drive if sleepy  Please contact office for sooner follow up if symptoms do not improve or worsen or seek emergency care  follow up Dr. Annamaria Boots  In 1 year and As needed

## 2016-02-18 ENCOUNTER — Other Ambulatory Visit: Payer: Self-pay | Admitting: Urology

## 2016-02-18 DIAGNOSIS — D3000 Benign neoplasm of unspecified kidney: Secondary | ICD-10-CM

## 2016-02-18 DIAGNOSIS — D49511 Neoplasm of unspecified behavior of right kidney: Secondary | ICD-10-CM | POA: Diagnosis not present

## 2016-02-18 DIAGNOSIS — D49512 Neoplasm of unspecified behavior of left kidney: Secondary | ICD-10-CM | POA: Diagnosis not present

## 2016-02-18 DIAGNOSIS — R3129 Other microscopic hematuria: Secondary | ICD-10-CM | POA: Diagnosis not present

## 2016-02-24 ENCOUNTER — Ambulatory Visit (HOSPITAL_COMMUNITY)
Admission: RE | Admit: 2016-02-24 | Discharge: 2016-02-24 | Disposition: A | Discharge status: Home / Self Care | Payer: Medicare Other | Source: Ambulatory Visit | Attending: Urology | Admitting: Urology

## 2016-02-24 DIAGNOSIS — D3000 Benign neoplasm of unspecified kidney: Secondary | ICD-10-CM

## 2016-02-24 DIAGNOSIS — N289 Disorder of kidney and ureter, unspecified: Secondary | ICD-10-CM | POA: Diagnosis not present

## 2016-02-24 DIAGNOSIS — D3001 Benign neoplasm of right kidney: Secondary | ICD-10-CM | POA: Diagnosis not present

## 2016-02-24 MED ORDER — GADOBENATE DIMEGLUMINE 529 MG/ML IV SOLN
20.0000 mL | Freq: Once | INTRAVENOUS | Status: AC | PRN
  Administered 2016-02-24: 18 mL via INTRAVENOUS

## 2016-04-02 ENCOUNTER — Other Ambulatory Visit: Payer: Self-pay | Admitting: Family

## 2016-04-03 ENCOUNTER — Other Ambulatory Visit (INDEPENDENT_AMBULATORY_CARE_PROVIDER_SITE_OTHER): Payer: Medicare Other

## 2016-04-03 ENCOUNTER — Ambulatory Visit (INDEPENDENT_AMBULATORY_CARE_PROVIDER_SITE_OTHER)
Admission: RE | Admit: 2016-04-03 | Discharge: 2016-04-03 | Disposition: A | Discharge status: Home / Self Care | Payer: Medicare Other | Source: Ambulatory Visit | Attending: Internal Medicine | Admitting: Internal Medicine

## 2016-04-03 ENCOUNTER — Telehealth: Payer: Self-pay | Admitting: Adult Health

## 2016-04-03 ENCOUNTER — Ambulatory Visit (INDEPENDENT_AMBULATORY_CARE_PROVIDER_SITE_OTHER): Payer: Medicare Other | Admitting: Adult Health

## 2016-04-03 ENCOUNTER — Encounter: Payer: Self-pay | Admitting: Adult Health

## 2016-04-03 VITALS — BP 118/84 | HR 63 | Temp 98.2°F | Ht 62.0 in | Wt 185.0 lb

## 2016-04-03 DIAGNOSIS — R079 Chest pain, unspecified: Secondary | ICD-10-CM | POA: Diagnosis not present

## 2016-04-03 DIAGNOSIS — R059 Cough, unspecified: Secondary | ICD-10-CM

## 2016-04-03 DIAGNOSIS — G4733 Obstructive sleep apnea (adult) (pediatric): Secondary | ICD-10-CM

## 2016-04-03 DIAGNOSIS — R05 Cough: Secondary | ICD-10-CM | POA: Diagnosis not present

## 2016-04-03 DIAGNOSIS — X3901XA Exposure to radon, initial encounter: Secondary | ICD-10-CM | POA: Diagnosis not present

## 2016-04-03 DIAGNOSIS — K921 Melena: Secondary | ICD-10-CM | POA: Diagnosis not present

## 2016-04-03 LAB — CBC WITH DIFFERENTIAL/PLATELET
BASOS PCT: 0.6 % (ref 0.0–3.0)
Basophils Absolute: 0 10*3/uL (ref 0.0–0.1)
EOS ABS: 0.2 10*3/uL (ref 0.0–0.7)
EOS PCT: 3.2 % (ref 0.0–5.0)
HEMATOCRIT: 40.5 % (ref 36.0–46.0)
HEMOGLOBIN: 13.6 g/dL (ref 12.0–15.0)
LYMPHS PCT: 37.1 % (ref 12.0–46.0)
Lymphs Abs: 2.5 10*3/uL (ref 0.7–4.0)
MCHC: 33.7 g/dL (ref 30.0–36.0)
MCV: 91.2 fl (ref 78.0–100.0)
MONOS PCT: 6.9 % (ref 3.0–12.0)
Monocytes Absolute: 0.5 10*3/uL (ref 0.1–1.0)
NEUTROS ABS: 3.5 10*3/uL (ref 1.4–7.7)
Neutrophils Relative %: 52.2 % (ref 43.0–77.0)
Platelets: 322 10*3/uL (ref 150.0–400.0)
RBC: 4.44 Mil/uL (ref 3.87–5.11)
RDW: 13.3 % (ref 11.5–15.5)
WBC: 6.6 10*3/uL (ref 4.0–10.5)

## 2016-04-03 LAB — BASIC METABOLIC PANEL
BUN: 12 mg/dL (ref 6–23)
CO2: 28 mEq/L (ref 19–32)
CREATININE: 0.72 mg/dL (ref 0.40–1.20)
Calcium: 10.7 mg/dL — ABNORMAL HIGH (ref 8.4–10.5)
Chloride: 106 mEq/L (ref 96–112)
GFR: 106.65 mL/min (ref >60.00)
GLUCOSE: 112 mg/dL — AB (ref 70–99)
POTASSIUM: 4.3 meq/L (ref 3.5–5.1)
Sodium: 141 mEq/L (ref 135–145)

## 2016-04-03 NOTE — Assessment & Plan Note (Signed)
Atypical CHest pain x 6 months , unclear etiology  Unable to perform EKG , advised that she needs to have this checked out  Advised on ER , she says it has been going on for 6 months  Agrees to cardiology referral  She is to follow up with PCP as well with multiple somatic complaints.

## 2016-04-03 NOTE — Patient Instructions (Addendum)
We will set you up for a CT chest with contrast.  Labs today .  Refer to PCP for black stools  Stop ibuprofen .  Refer to cardiology for chest pain for 6 months.  Continue on CPAP At bedtime  .  Do not drive if sleepy  Please contact office for sooner follow up if symptoms do not improve or worsen or seek emergency care  follow up Dr. Annamaria Boots  In 1 year and As needed

## 2016-04-03 NOTE — Assessment & Plan Note (Signed)
Cont on CPAP  

## 2016-04-03 NOTE — Assessment & Plan Note (Signed)
Refer to PCP  Check labs

## 2016-04-03 NOTE — Assessment & Plan Note (Signed)
?  radon exposure with elevated level on radon house screeing CXR is clear  Pt request for further evaluation  Set up for CT chest , former smoker as well.

## 2016-04-03 NOTE — Progress Notes (Signed)
Subjective:    Patient ID: Brittany [name], female    DOB: 01-31-1956, 60 y.o.   MRN: [id number]  HPI 60 year old female former smoker followed for obstructive sleep apnea on CPAP   04/03/2016 Acute OV : Walk in visit  Pt presents to clinic today , walked in today to be checked for radon exposure.  She is followed for sleep apnea by Dr. Annamaria Boots  .  Says she is doing well on CPAP at bedtime.   She is selling her house and the radon check showed levels were elevated at 5.3 (nml 4.0)  She denies weight loss, hemoptysis , cough or dyspnea.  Chest xray today is clear. We discussed her results . She wants to have "MRI of her chest " to make sure she does not have cancer.  We discussed that CT chest is usually the test we preform.   She also complains that she has had chest pain for last 6 months . episgastric to mid chest that comes and goes. No diaphoresis , radiating pain, nausea or back pain.  She had neg cardiac stress test in 2015 .  She says she has been under a lot of stress with moving to [place] . Husband has had critical illness.  EKG machine in office is out of service today in clinic .   She also complains that she is having black stools on/off . Taking a lot of motrin.  No n/v/d. No bright red blood.   We discussed that she has a lot of medical issues that she needs  A follow up with her PCP to discuss  All these issues and keep follow up .      Past Medical History:  Diagnosis Date  . ACNE NEC   . ALLERGIC RHINITIS   . ALOPECIA   . Anal fissure   . Anxiety    Panic Attack.  None in a long time  . Arthritis   . Bowel obstruction   . Chronic headaches    migraine- none in years  . Constipation    drinks tea with senna  . DEPRESSION   . Depression   . DISORDER, BIPOLAR NEC   . Family history of breast cancer   . Family history of pancreatic cancer   . GERD (gastroesophageal reflux disease)   . HEMORRHOIDS-INTERNAL   . HYPERLIPIDEMIA   . OBSTRUCTIVE SLEEP APNEA     . Seasonal allergies   . URI    Current Outpatient Prescriptions on File Prior to Visit  Medication Sig Dispense Refill  . aspirin 325 MG tablet Take 325 mg by mouth once.    Marland Kitchen azelastine (ASTELIN) 0.1 % nasal spray Place 2 sprays into both nostrils 2 (two) times daily. Use in each nostril as directed 30 mL 5  . buPROPion (WELLBUTRIN XL) 300 MG 24 hr tablet Take 300 mg by mouth every morning.    . Carbamazepine (EQUETRO) 300 MG CP12 Take 600 mg by mouth at bedtime.     . cetirizine (ZYRTEC) 10 MG tablet Take 1 tablet (10 mg total) by mouth daily. 30 tablet 5  . diclofenac sodium (VOLTAREN) 1 % GEL Apply 2 g topically 4 (four) times daily. 100 g 1  . ibuprofen (ADVIL,MOTRIN) 400 MG tablet Take 2 tablets by mouth as needed.    . lamoTRIgine (LAMICTAL) 150 MG tablet Take 300 mg by mouth daily.     . meloxicam (MOBIC) 15 MG tablet Take 1 tablet (15 mg total) by mouth daily.  30 tablet 3  . methocarbamol (ROBAXIN) 500 MG tablet Take 1 tablet (500 mg total) by mouth every 8 (eight) hours as needed for muscle spasms. 90 tablet 0  . mometasone (NASONEX) 50 MCG/ACT nasal spray Place 2 sprays into the nose daily. 17 g 5  . Multiple Vitamin (MULTIVITAMIN WITH MINERALS) TABS Take 1 tablet by mouth daily.    Marland Kitchen omeprazole (PRILOSEC) 40 MG capsule TAKE 1 CAPSULE DAILY, YEARLY PHYSICAL IS DUE, MUST SEE DOCTOR FOR REFILLS 90 capsule 0  . oxyCODONE-acetaminophen (PERCOCET) 10-325 MG per tablet Take 1 tablet by mouth every 8 (eight) hours as needed for pain. 10 tablet 0  . promethazine-codeine (PHENERGAN WITH CODEINE) 6.25-10 MG/5ML syrup Take 5 mLs by mouth every 4 (four) hours as needed for cough. 180 mL 0  . sertraline (ZOLOFT) 50 MG tablet Take 50 mg by mouth every morning.      Current Facility-Administered Medications on File Prior to Visit  Medication Dose Route Frequency Provider Last Rate Last Dose  . methylPREDNISolone acetate (DEPO-MEDROL) injection 80 mg  80 mg Intra-articular Once Kennyth Arnold,  FNP        Review of Systems  Constitutional:   No  weight loss, night sweats,  Fevers, chills,  +fatigue, or  lassitude.  HEENT:   No headaches,  Difficulty swallowing,  Tooth/dental problems, or  Sore throat,                No sneezing, itching, ear ache, nasal congestion, post nasal drip,   CV:  No  Orthopnea, PND, swelling in lower extremities, anasarca, dizziness, palpitations, syncope.   GI  No heartburn, indigestion, abdominal pain, nausea, vomiting, diarrhea,  loss of appetite, bloody stools.   Resp: No shortness of breath with exertion or at rest.  No excess mucus, no productive cough,  No non-productive cough,  No coughing up of blood.  No change in color of mucus.  No wheezing.  No chest wall deformity  Skin: no rash or lesions.  GU: no dysuria, change in color of urine, no urgency or frequency.  No flank pain, no hematuria   MS:  No joint pain or swelling.  No decreased range of motion.  No back pain.  Psych:  No change in mood or affect. No depression or anxiety.  No memory loss.          Objective:   Physical Exam Vitals:   04/03/16 1034  BP: 118/84  Pulse: 63  Temp: 98.2 F (36.8 C)  TempSrc: Oral  SpO2: 97%  Weight: 185 lb (83.9 kg)  Height: 5\' 2"  (1.575 m)   GEN: A/Ox3; pleasant , NAD, well nourished    HEENT:  /AT,  EACs-clear, TMs-wnl, NOSE-clear, THROAT-clear, no lesions, no postnasal drip or exudate noted.   NECK:  Supple w/ fair ROM; no JVD; normal carotid impulses w/o bruits; no thyromegaly or nodules palpated; no lymphadenopathy.    RESP  Clear  P & A; w/o, wheezes/ rales/ or rhonchi. no accessory muscle use, no dullness to percussion  CARD:  RRR, no m/r/g  , no peripheral edema, pulses intact, no cyanosis or clubbing.  GI:   Soft & nt; nml bowel sounds; no organomegaly or masses detected.   Musco: Warm bil, no deformities or joint swelling noted.   Neuro: alert, no focal deficits noted.    Skin: Warm, no lesions or rashes  Tammy  Parrett NP-C  [facility]  04/03/2016  Assessment & Plan:

## 2016-04-03 NOTE — Telephone Encounter (Signed)
Pt in lobby, requesting a CT/MRI asap Pt having cough and chest pain for the last few weeks. Pt thinks it might be stress related but could be something abnormal. Patient is concerned that d/t her Radon level in her home being so high that something might be wrong. Pt states that she is selling her house and the radon levels read at 5.3. Pt denies SOB, wheezing, chest tightness, mucus production or hemoptysis. Pt states that CP is constant and is worse with exertion; center chest, never radiates.  Per Dr Annamaria Boots, patient has not had a CXR since 2014 so therefore will need a follow up cxr prior to having a CT/MRI.  Pt understands this and has been sent down for a cxr now and is seeing Polly Cobia, NP at 10:30 today to discuss follow up scanning if necessary.   Will send to TP as FYI of patient coming in today.

## 2016-04-04 ENCOUNTER — Encounter: Payer: Self-pay | Admitting: Physician Assistant

## 2016-04-04 NOTE — Progress Notes (Signed)
Called spoke with pt. Reviewed results and recs. Pt voiced understanding and had no further questions.

## 2016-04-05 ENCOUNTER — Ambulatory Visit (INDEPENDENT_AMBULATORY_CARE_PROVIDER_SITE_OTHER)
Admission: RE | Admit: 2016-04-05 | Discharge: 2016-04-05 | Disposition: A | Discharge status: Home / Self Care | Payer: Medicare Other | Source: Ambulatory Visit | Attending: Adult Health | Admitting: Adult Health

## 2016-04-05 ENCOUNTER — Encounter: Payer: Self-pay | Admitting: Physician Assistant

## 2016-04-05 ENCOUNTER — Ambulatory Visit (INDEPENDENT_AMBULATORY_CARE_PROVIDER_SITE_OTHER): Payer: Medicare Other | Admitting: Physician Assistant

## 2016-04-05 VITALS — BP 100/60 | HR 62 | Ht 62.0 in | Wt 187.8 lb

## 2016-04-05 DIAGNOSIS — K219 Gastro-esophageal reflux disease without esophagitis: Secondary | ICD-10-CM | POA: Diagnosis not present

## 2016-04-05 DIAGNOSIS — X3901XA Exposure to radon, initial encounter: Secondary | ICD-10-CM

## 2016-04-05 DIAGNOSIS — G4733 Obstructive sleep apnea (adult) (pediatric): Secondary | ICD-10-CM | POA: Diagnosis not present

## 2016-04-05 DIAGNOSIS — H527 Unspecified disorder of refraction: Secondary | ICD-10-CM | POA: Diagnosis not present

## 2016-04-05 DIAGNOSIS — R0789 Other chest pain: Secondary | ICD-10-CM | POA: Diagnosis not present

## 2016-04-05 DIAGNOSIS — H02831 Dermatochalasis of right upper eyelid: Secondary | ICD-10-CM | POA: Diagnosis not present

## 2016-04-05 DIAGNOSIS — H25813 Combined forms of age-related cataract, bilateral: Secondary | ICD-10-CM | POA: Diagnosis not present

## 2016-04-05 DIAGNOSIS — R079 Chest pain, unspecified: Secondary | ICD-10-CM | POA: Diagnosis not present

## 2016-04-05 MED ORDER — IOPAMIDOL (ISOVUE-300) INJECTION 61%
100.0000 mL | Freq: Once | INTRAVENOUS | Status: AC | PRN
  Administered 2016-04-05: 80 mL via INTRAVENOUS

## 2016-04-05 NOTE — Patient Instructions (Signed)
Medication Instructions:  No changes.  See your medication list.  Labwork: None   Testing/Procedures: 1. Get your chest CT done today as scheduled by Rexene Edison, NP 2. Schedule exercise treadmill test.  Follow-Up: Dr. Jenkins Rouge as needed.   Any Other Special Instructions Will Be Listed Below (If Applicable).  If you need a refill on your cardiac medications before your next appointment, please call your pharmacy.

## 2016-04-05 NOTE — Progress Notes (Signed)
Cardiology Office Note:    Date:  04/05/2016   ID:  Brittany [name], DOB 15-Aug-1955, MRN [id number]  PCP:  Mauricio Po, FNP  Cardiologist:  Dr. Jenkins Rouge   Electrophysiologist:  n/a  Referring MD: Golden Circle, FNP   Chief Complaint  Patient presents with  . Chest Pain    History of Present Illness:    Brittany [name] is a 60 y.o. female with a hx of prior tobacco use and OSA.  She was seen by Rexene Edison, NP at Pulmonology this week with complaints of chest pain.  She also noted Radon exposure and black stools.  She is scheduled for a chest CTA today.  Recent Hgb is normal.  She notes chest pain since her husband became sick several months ago.  He was hospitalized in [place]. He was in the ICU for 2 months.  She had to move there and is in the process of selling her home in [place].  She prefers to see providers here.  She denies exertional chest pain.  She denies dyspnea on exertion, syncope, orthopnea, PND, edema.  She wears CPAP at night.  She seems to only feel chest pain when she is under stress.  She feels better with relaxation techniques. She feels that her GERD is controlled.  She does not smoke. She does not have diabetes.     Prior CV studies that were reviewed today include:    Myoview 4/15 Overall Impression:  Low risk stress nuclear study with a small area of distal anterior breast attenuation artifact. No ischemia.  LV Ejection Fraction: 63%.  LV Wall Motion:  NL LV Function; NL Wall Motion  Echo 4/15 Left ventricle: The cavity size was normal. Systolic function was normal. The estimated ejection fraction was in the range of 55% to 60%. Wall motion was normal; there were no regional wall motion abnormalities. Left ventricular diastolic function parameters were normal. Transthoracic echocardiography. M-mode, complete 2D, spectral Doppler, and color Doppler. Height: Height: 157.5cm. Height: 62in. Weight: Weight: 90.7kg. Weight: 199.6lb. Body mass  index: BMI: 36.6kg/m^2. Body surface area:  BSA: 1.20m^2. Blood pressure:   110/70. Patient status: Outpatient. Location: [facility]  ------------------------------------------------------------  ------------------------------------------------------------ Left ventricle: The cavity size was normal. Systolic function was normal. The estimated ejection fraction was in the range of 55% to 60%. Wall motion was normal; there were no regional wall motion abnormalities. The transmitral flow pattern was normal. The deceleration time of the early transmitral flow velocity was normal. The pulmonary vein flow pattern was normal. The tissue Doppler parameters were normal. Left ventricular diastolic function parameters were normal.  ------------------------------------------------------------ Aortic valve:  Trileaflet; normal thickness leaflets. Mobility was not restricted. Doppler: Transvalvular velocity was within the normal range. There was no stenosis. No regurgitation.  ETT-Echo 11/12 With stress there is normal increase in thickening and contractility in all segments. There is decrease in cavity size. This is a normal stress echo study.  Past Medical History:  Diagnosis Date  . ACNE NEC   . ALLERGIC RHINITIS   . ALOPECIA   . Anal fissure   . Anxiety    Panic Attack.  None in a long time  . Arthritis   . Bowel obstruction   . Chronic headaches    migraine- none in years  . Constipation    drinks tea with senna  . DEPRESSION   . Depression   . DISORDER, BIPOLAR NEC   . Family history of breast cancer   .  Family history of pancreatic cancer   . GERD (gastroesophageal reflux disease)   . HEMORRHOIDS-INTERNAL   . HYPERLIPIDEMIA   . OBSTRUCTIVE SLEEP APNEA   . Seasonal allergies   . URI     Past Surgical History:  Procedure Laterality Date  . BREAST LUMPECTOMY Right last done 2005   x 2  . CHOLECYSTECTOMY N/A 03/07/2014   Procedure:  LAPAROSCOPIC CHOLECYSTECTOMY WITH INTRAOPERATIVE CHOLANGIOGRAM;  Surgeon: Fanny Skates, MD;  Location: [facility];  Service: General;  Laterality: N/A;  . EUS N/A 01/27/2014   Procedure: ESOPHAGEAL ENDOSCOPIC ULTRASOUND (EUS) RADIAL;  Surgeon: Arta Silence, MD;  Location: [facility];  Service: Endoscopy;  Laterality: N/A;  . HAND SURGERY Bilateral    for arthritis  left 02/02/14- right approx 2014  . KNEE SURGERY Bilateral    right x 2 left x 1.  arthroscopy  . RADICAL HYSTERECTOMY    . thumb and pinkie finger surgery Right 2014  . TONSILLECTOMY      Current Medications: Current Meds  Medication Sig  . azelastine (ASTELIN) 0.1 % nasal spray Place 2 sprays into both nostrils 2 (two) times daily. Use in each nostril as directed  . buPROPion (WELLBUTRIN XL) 300 MG 24 hr tablet Take 300 mg by mouth every morning.  . Carbamazepine (EQUETRO) 300 MG CP12 Take 600 mg by mouth at bedtime.   . cetirizine (ZYRTEC) 10 MG tablet Take 1 tablet (10 mg total) by mouth daily.  . diclofenac sodium (VOLTAREN) 1 % GEL Apply 2 g topically 4 (four) times daily.  Marland Kitchen ibuprofen (ADVIL,MOTRIN) 400 MG tablet Take 2 tablets by mouth as needed for mild pain or moderate pain.   Marland Kitchen lamoTRIgine (LAMICTAL) 150 MG tablet Take 300 mg by mouth daily.   . meloxicam (MOBIC) 15 MG tablet Take 1 tablet (15 mg total) by mouth daily.  . methocarbamol (ROBAXIN) 500 MG tablet Take 1 tablet (500 mg total) by mouth every 8 (eight) hours as needed for muscle spasms.  . mometasone (NASONEX) 50 MCG/ACT nasal spray Place 2 sprays into the nose daily.  . Multiple Vitamin (MULTIVITAMIN WITH MINERALS) TABS Take 1 tablet by mouth daily.  Marland Kitchen omeprazole (PRILOSEC) 40 MG capsule Take 40 mg by mouth daily.  . promethazine-codeine (PHENERGAN WITH CODEINE) 6.25-10 MG/5ML syrup Take 5 mLs by mouth every 4 (four) hours as needed for cough.  . sertraline (ZOLOFT) 50 MG tablet Take 50 mg by mouth every morning.      Allergies:   Flexeril  [cyclobenzaprine] and Hydrocodone   Social History   Social History  . Marital status: Married    Spouse name: N/A  . Number of children: 4  . Years of education: N/A   Occupational History  . disabled Unemployed   Social History Main Topics  . Smoking status: Former Smoker    Packs/day: 0.10    Years: 7.00    Types: Cigarettes    Quit date: 06/19/1981  . Smokeless tobacco: Never Used  . Alcohol use No     Comment: rare  . Drug use: No  . Sexual activity: Not Asked   Other Topics Concern  . None   Social History Narrative  . None     Family History:  The patient's family history includes Aneurysm in her paternal grandmother; Breast cancer in her maternal aunt, maternal aunt, paternal aunt, and paternal aunt; Breast cancer (age of onset: 20) in her cousin; Breast cancer (age of onset: 51) in her maternal grandmother; Breast cancer (age of  onset: 37) in her mother; Breast cancer (age of onset: 31) in her paternal aunt; COPD in her father; Cancer in her maternal uncle and maternal uncle; Colon cancer in her maternal uncle; Colon polyps in her maternal uncle; Esophageal cancer in her paternal uncle; Heart disease in her sister; Hypertension in her brother; Irritable bowel syndrome in her cousin; Kidney disease in her maternal uncle; Lung cancer in her paternal uncle; Other in her paternal aunt; Pancreatic cancer in her paternal uncle; Stroke in her maternal grandfather.   ROS:   Please see the history of present illness.    ROS All other systems reviewed and are negative.   EKGs/Labs/Other Test Reviewed:    EKG:  EKG is  ordered today.  The ekg ordered today demonstrates NSR, HR 62, normal axis, QTc 416 ms, no change from last tracing 09/15/13.  Recent Labs: 04/03/2016: BUN 12; Creatinine, Ser 0.72; Hemoglobin 13.6; Platelets 322.0; Potassium 4.3; Sodium 141   Recent Lipid Panel    Component Value Date/Time   CHOL 214 (H) 10/21/2012 0953   TRIG 119.0 10/21/2012 0953   HDL  58.20 10/21/2012 0953   CHOLHDL 4 10/21/2012 0953   VLDL 23.8 10/21/2012 0953   LDLCALC 121 (H) 03/20/2007 1334   LDLDIRECT 134.9 10/21/2012 0953     Physical Exam:    VS:  BP 100/60   Pulse 62   Ht 5\' 2"  (1.575 m)   Wt 187 lb 12.8 oz (85.2 kg)   BMI 34.35 kg/m     Wt Readings from Last 3 Encounters:  04/05/16 187 lb 12.8 oz (85.2 kg)  04/03/16 185 lb (83.9 kg)  02/15/16 188 lb (85.3 kg)     Physical Exam  Constitutional: She is oriented to person, place, and time. She appears well-developed and well-nourished. No distress.  HENT:  Head: Normocephalic and atraumatic.  Eyes: No scleral icterus.  Neck: No JVD present.  Cardiovascular: Normal rate, regular rhythm and normal heart sounds.   No murmur heard. Pulmonary/Chest: Effort normal. She has no wheezes. She has no rales.  Abdominal: There is no tenderness.  Musculoskeletal: She exhibits no edema.  Neurological: She is alert and oriented to person, place, and time.  Skin: Skin is warm and dry.  Psychiatric: She has a normal mood and affect.    ASSESSMENT:    1. Other chest pain   2. OSA (obstructive sleep apnea)   3. Gastroesophageal reflux disease without esophagitis    PLAN:    In order of problems listed above:  1. Chest pain - Atypical symptoms.  ECG normal.  She has few risk factors (prior smoking).  She denies exertional symptoms. I suspect her chest pain is all related to anxiety/stress.  She feels she can walk on a treadmill.   -  Arrange ETT to screen for ischemic heart disease  2. OSA - Continue CPAP.  3. GERD - No symptoms to suggest poor control of GERD.  Continue proton pump inhibitor.   Medication Adjustments/Labs and Tests Ordered: Current medicines are reviewed at length with the patient today.  Concerns regarding medicines are outlined above.  Medication changes, Labs and Tests ordered today are outlined in the Patient Instructions noted below. Patient Instructions  Medication Instructions:   No changes.  See your medication list.  Labwork: None   Testing/Procedures: 1. Get your chest CT done today as scheduled by Rexene Edison, NP 2. Schedule exercise treadmill test.  Follow-Up: Dr. Jenkins Rouge as needed.   Any Other Special Instructions  Will Be Listed Below (If Applicable).  If you need a refill on your cardiac medications before your next appointment, please call your pharmacy.   Signed, Richardson Dopp, PA-C  04/05/2016 3:51 PM    [facility] [address], [place], [place]  [address] Phone: [phone]; Fax: [phone]

## 2016-04-06 ENCOUNTER — Inpatient Hospital Stay: Admission: RE | Admit: 2016-04-06 | Payer: Medicare Other | Source: Ambulatory Visit

## 2016-04-06 ENCOUNTER — Encounter: Payer: Self-pay | Admitting: Family

## 2016-04-06 ENCOUNTER — Ambulatory Visit (INDEPENDENT_AMBULATORY_CARE_PROVIDER_SITE_OTHER): Payer: Medicare Other | Admitting: Family

## 2016-04-06 VITALS — BP 124/72 | HR 73 | Temp 98.4°F | Resp 16 | Ht 62.0 in | Wt 184.0 lb

## 2016-04-06 DIAGNOSIS — M545 Low back pain, unspecified: Secondary | ICD-10-CM

## 2016-04-06 DIAGNOSIS — G8929 Other chronic pain: Secondary | ICD-10-CM | POA: Diagnosis not present

## 2016-04-06 DIAGNOSIS — R195 Other fecal abnormalities: Secondary | ICD-10-CM | POA: Diagnosis not present

## 2016-04-06 NOTE — Progress Notes (Signed)
Called spoke with pt. Reviewed results and recs. Pt voiced understanding and had no further questions.

## 2016-04-06 NOTE — Progress Notes (Signed)
Subjective:    Patient ID: Brittany [name], female    DOB: 1955/09/29, 60 y.o.   MRN: [id number]  Chief Complaint  Patient presents with  . Back Pain    is here to get something to take for her back pain other than ibuprofen which was causing her to have black stools    HPI:  Brittany [name] is a 60 y.o. female who  has a past medical history of ACNE NEC; ALLERGIC RHINITIS; ALOPECIA; Anal fissure; Anxiety; Arthritis; Bowel obstruction; Chronic headaches; Constipation; DEPRESSION; Depression; DISORDER, BIPOLAR NEC; Family history of breast cancer; Family history of pancreatic cancer; GERD (gastroesophageal reflux disease); HEMORRHOIDS-INTERNAL; HYPERLIPIDEMIA; OBSTRUCTIVE SLEEP APNEA; Seasonal allergies; and URI. and presents today for an office visit.  1.) Dark stools - This is a new problem. Associated symptom of dark, tarry stools has been going on for about 1 month. Denies abdominal pain. She has had some bright red blood which she is possibly believes related to hemorrhoids. Modifying factors include stopping ibuprofen which she was taking about 1200 mg daily which has helped to resolve her symptoms. Since stopping the ibuprofen the stools have returned to normal. Previous hemoglobin appears stable.  Lab Results  Component Value Date   HGB 13.6 04/03/2016     2.) Chronic back pain - Continues to experience the associated symptom of back pain located throughout her back that ranges from her neck to her lumbar spine. Has had previous treatment failure with Tramadol. Modifying factors include ibuprofen, however secondary to dark, tarry stools the ibuprofen has since been discontinued. Describes that she has scoliosis and hurt her back in the [employer]. She has seen neurosurgery in the past with no surgical intervention needed.     Allergies  Allergen Reactions  . Flexeril [Cyclobenzaprine]     Unable to leave home when taking as she is incoherent/unsure where she is at times  .  Hydrocodone     REACTION: GI upset      Outpatient Medications Prior to Visit  Medication Sig Dispense Refill  . azelastine (ASTELIN) 0.1 % nasal spray Place 2 sprays into both nostrils 2 (two) times daily. Use in each nostril as directed 30 mL 5  . buPROPion (WELLBUTRIN XL) 300 MG 24 hr tablet Take 300 mg by mouth every morning.    . Carbamazepine (EQUETRO) 300 MG CP12 Take 600 mg by mouth at bedtime.     . cetirizine (ZYRTEC) 10 MG tablet Take 1 tablet (10 mg total) by mouth daily. 30 tablet 5  . diclofenac sodium (VOLTAREN) 1 % GEL Apply 2 g topically 4 (four) times daily. 100 g 1  . lamoTRIgine (LAMICTAL) 150 MG tablet Take 300 mg by mouth daily.     . methocarbamol (ROBAXIN) 500 MG tablet Take 1 tablet (500 mg total) by mouth every 8 (eight) hours as needed for muscle spasms. 90 tablet 0  . mometasone (NASONEX) 50 MCG/ACT nasal spray Place 2 sprays into the nose daily. 17 g 5  . Multiple Vitamin (MULTIVITAMIN WITH MINERALS) TABS Take 1 tablet by mouth daily.    Marland Kitchen omeprazole (PRILOSEC) 40 MG capsule Take 40 mg by mouth daily.    . sertraline (ZOLOFT) 50 MG tablet Take 50 mg by mouth every morning.     Marland Kitchen ibuprofen (ADVIL,MOTRIN) 400 MG tablet Take 2 tablets by mouth as needed for mild pain or moderate pain.     . meloxicam (MOBIC) 15 MG tablet Take 1 tablet (15 mg total) by mouth daily.  30 tablet 3  . promethazine-codeine (PHENERGAN WITH CODEINE) 6.25-10 MG/5ML syrup Take 5 mLs by mouth every 4 (four) hours as needed for cough. 180 mL 0  . methylPREDNISolone acetate (DEPO-MEDROL) injection 80 mg      No facility-administered medications prior to visit.       Past Surgical History:  Procedure Laterality Date  . BREAST LUMPECTOMY Right last done 2005   x 2  . CHOLECYSTECTOMY N/A 03/07/2014   Procedure: LAPAROSCOPIC CHOLECYSTECTOMY WITH INTRAOPERATIVE CHOLANGIOGRAM;  Surgeon: Fanny Skates, MD;  Location: [facility];  Service: General;  Laterality: N/A;  . EUS N/A 01/27/2014    Procedure: ESOPHAGEAL ENDOSCOPIC ULTRASOUND (EUS) RADIAL;  Surgeon: Arta Silence, MD;  Location: [facility];  Service: Endoscopy;  Laterality: N/A;  . HAND SURGERY Bilateral    for arthritis  left 02/02/14- right approx 2014  . KNEE SURGERY Bilateral    right x 2 left x 1.  arthroscopy  . RADICAL HYSTERECTOMY    . thumb and pinkie finger surgery Right 2014  . TONSILLECTOMY        Past Medical History:  Diagnosis Date  . ACNE NEC   . ALLERGIC RHINITIS   . ALOPECIA   . Anal fissure   . Anxiety    Panic Attack.  None in a long time  . Arthritis   . Bowel obstruction   . Chronic headaches    migraine- none in years  . Constipation    drinks tea with senna  . DEPRESSION   . Depression   . DISORDER, BIPOLAR NEC   . Family history of breast cancer   . Family history of pancreatic cancer   . GERD (gastroesophageal reflux disease)   . HEMORRHOIDS-INTERNAL   . HYPERLIPIDEMIA   . OBSTRUCTIVE SLEEP APNEA   . Seasonal allergies   . URI       Review of Systems  Constitutional: Negative for chills and fever.  Respiratory: Negative for chest tightness and shortness of breath.   Cardiovascular: Negative for chest pain, palpitations and leg swelling.  Gastrointestinal: Positive for blood in stool. Negative for abdominal pain, anal bleeding, constipation, diarrhea, nausea and vomiting.      Objective:    BP 124/72 (BP Location: Left Arm, Patient Position: Sitting, Cuff Size: Normal)   Pulse 73   Temp 98.4 F (36.9 C) (Oral)   Resp 16   Ht 5\' 2"  (1.575 m)   Wt 184 lb (83.5 kg)   SpO2 96%   BMI 33.65 kg/m  Nursing note and vital signs reviewed.  Physical Exam  Constitutional: She is oriented to person, place, and time. She appears well-developed and well-nourished. No distress.  Cardiovascular: Normal rate, regular rhythm, normal heart sounds and intact distal pulses.   Pulmonary/Chest: Effort normal and breath sounds normal.  Abdominal: Soft. Normal appearance. There  is no hepatosplenomegaly. There is tenderness in the epigastric area. There is no rigidity, no guarding, no tenderness at McBurney's point and negative Murphy's sign.  Musculoskeletal:  Spine - no obvious deformity or discoloration. There is diffuse tenderness along the spinous processes and paraspinal musculature. Range of motion slightly limited secondary to discomfort. Distal pulses and sensation are intact and appropriate for upper and lower extremities.  Neurological: She is alert and oriented to person, place, and time.  Skin: Skin is warm and dry.  Psychiatric: She has a normal mood and affect. Her behavior is normal. Judgment and thought content normal.       Assessment & Plan:  Problem List Items Addressed This Visit      Other   Chronic bilateral low back pain without sciatica - Primary    Continues to experience low back pain with treatment failure with tramadol and no surgery recommended per neurosurgery. Continue conservative treatment with nonpharmacological therapies. Consider referral to nonsurgical orthopedics for possible cortisone injections. Patient wishes to continue solely with nonpharmacological treatment at this time. Continue to monitor.      Dark stools    Dark stools with concern for possible upper gastrointestinal bleed secondary to nonsteroidal anti-inflammatories which appears stable with most recent hemoglobin of 13.6. Recommended avoidance of nonsteroidal anti-inflammatories and aspirin. Obtain Hemoccult cards. Stools have returned to normal. Continue to monitor pending Hemoccult results.      Relevant Orders   Hemoccult Cards (X3 cards)    Other Visit Diagnoses   None.      I have discontinued Ms. Staebell's meloxicam, ibuprofen, and promethazine-codeine. I am also having her maintain her lamoTRIgine, sertraline, multivitamin with minerals, Carbamazepine, buPROPion, methocarbamol, diclofenac sodium, cetirizine, mometasone, azelastine, and omeprazole. We  will stop administering methylPREDNISolone acetate.   Follow-up: Return in about 1 month (around 05/07/2016), or if symptoms worsen or fail to improve.  Mauricio Po, FNP

## 2016-04-06 NOTE — Assessment & Plan Note (Signed)
Dark stools with concern for possible upper gastrointestinal bleed secondary to nonsteroidal anti-inflammatories which appears stable with most recent hemoglobin of 13.6. Recommended avoidance of nonsteroidal anti-inflammatories and aspirin. Obtain Hemoccult cards. Stools have returned to normal. Continue to monitor pending Hemoccult results.

## 2016-04-06 NOTE — Patient Instructions (Addendum)
Thank you for choosing [facility].  SUMMARY AND INSTRUCTIONS:  Please bring the stool cards back to the lab for evaluation.   Avoid ibuprofen, Aleve, and aspirin.   Continue non-pharmacological methods for pain including ice/moist heat, icy hots, TENS unit, and Silonpas.    Labs:  Please stop by the lab on the lower level of the building for your blood work. Your results will be released to [place] (or called to you) after review, usually within 72 hours after test completion. If any changes need to be made, you will be notified at that same time.  1.) The lab is open from 7:30am to 5:30 pm Monday-Friday 2.) No appointment is necessary 3.) Fasting (if needed) is 6-8 hours after food and drink; black coffee and water are okay    Follow up:  If your symptoms worsen or fail to improve, please contact our office for further instruction, or in case of emergency go directly to the emergency room at the closest medical facility.

## 2016-04-06 NOTE — Assessment & Plan Note (Signed)
Continues to experience low back pain with treatment failure with tramadol and no surgery recommended per neurosurgery. Continue conservative treatment with nonpharmacological therapies. Consider referral to nonsurgical orthopedics for possible cortisone injections. Patient wishes to continue solely with nonpharmacological treatment at this time. Continue to monitor.

## 2016-04-10 ENCOUNTER — Encounter: Payer: Self-pay | Admitting: Physician Assistant

## 2016-04-11 ENCOUNTER — Other Ambulatory Visit (INDEPENDENT_AMBULATORY_CARE_PROVIDER_SITE_OTHER): Payer: Medicare Other

## 2016-04-11 DIAGNOSIS — R195 Other fecal abnormalities: Secondary | ICD-10-CM

## 2016-04-11 LAB — HEMOCCULT SLIDES (X 3 CARDS)
FECAL OCCULT BLD: NEGATIVE
OCCULT 1: NEGATIVE
OCCULT 2: NEGATIVE
OCCULT 3: NEGATIVE
OCCULT 4: NEGATIVE
OCCULT 5: NEGATIVE

## 2016-04-13 ENCOUNTER — Ambulatory Visit (INDEPENDENT_AMBULATORY_CARE_PROVIDER_SITE_OTHER): Payer: Medicare Other

## 2016-04-13 ENCOUNTER — Encounter: Payer: Self-pay | Admitting: Physician Assistant

## 2016-04-13 DIAGNOSIS — R0789 Other chest pain: Secondary | ICD-10-CM

## 2016-04-13 LAB — EXERCISE TOLERANCE TEST
CSEPED: 7 min
CSEPEW: 8.5 METS
CSEPHR: 88 %
CSEPPHR: 144 {beats}/min
Exercise duration (sec): 0 s
MPHR: 162 {beats}/min
RPE: 17
Rest HR: 70 {beats}/min

## 2016-05-01 ENCOUNTER — Telehealth: Payer: Self-pay | Admitting: Adult Health

## 2016-05-01 NOTE — Telephone Encounter (Signed)
Called and spoke with pt and she stated that [facility] will not get her supplies sent to her.  Pt stated that she did not know why they would not send her these.    I called and spoke with [facility]---KIM  And she stated that they cannot bill the pt since she lives in [place] and not in this area.  They will have to set her up with someone closer to where the pt lives, not to mention that if her machine were to break, she would not have anyone there to service it.  I called and advised the pt of this and she was ok with this.  She is aware that they will have someone contact her to set up her supplies being sent to her.

## 2016-05-01 NOTE — Telephone Encounter (Signed)
lmomtcb x1 

## 2016-05-04 ENCOUNTER — Telehealth: Payer: Self-pay | Admitting: Internal Medicine

## 2016-05-04 DIAGNOSIS — G4733 Obstructive sleep apnea (adult) (pediatric): Secondary | ICD-10-CM

## 2016-05-04 NOTE — Telephone Encounter (Signed)
Lmtcb. Will await call back 

## 2016-05-04 NOTE — Telephone Encounter (Signed)
Ok to order replacement mask of choice, supplies   Dx [facility]

## 2016-05-04 NOTE — Telephone Encounter (Signed)
CDY- please advise if okay to order new CPAP supplies  She wants orders sent to [facility] in [place]  She moved there but still wants to see you, and has scheduled rov for Oct 2018 Thanks

## 2016-05-04 NOTE — Telephone Encounter (Signed)
Patient called with [facility] at her home in [place]. [employer] rep Clarene Critchley states pt needs order to be faxed for cpap supplies- pt needs mask that cover face,nose, and mouth. Fax number is [phone], Theresa's phone number is [phone]. -pr

## 2016-05-05 NOTE — Telephone Encounter (Signed)
Patient called back - she can be reached at [phone] -pr

## 2016-05-05 NOTE — Telephone Encounter (Signed)
Spoke with pt. She is aware that we will send this order to [facility] in [place]. Order has been placed. Nothing further was needed.

## 2016-05-16 DIAGNOSIS — F3170 Bipolar disorder, currently in remission, most recent episode unspecified: Secondary | ICD-10-CM | POA: Diagnosis not present

## 2016-05-26 DIAGNOSIS — Z8719 Personal history of other diseases of the digestive system: Secondary | ICD-10-CM | POA: Diagnosis not present

## 2016-05-26 DIAGNOSIS — J069 Acute upper respiratory infection, unspecified: Secondary | ICD-10-CM | POA: Diagnosis not present

## 2016-05-26 DIAGNOSIS — Z6834 Body mass index (BMI) 34.0-34.9, adult: Secondary | ICD-10-CM | POA: Diagnosis not present

## 2016-07-12 DIAGNOSIS — F319 Bipolar disorder, unspecified: Secondary | ICD-10-CM | POA: Diagnosis not present

## 2016-07-12 DIAGNOSIS — E669 Obesity, unspecified: Secondary | ICD-10-CM | POA: Diagnosis not present

## 2016-07-12 DIAGNOSIS — G4733 Obstructive sleep apnea (adult) (pediatric): Secondary | ICD-10-CM | POA: Diagnosis not present

## 2016-07-12 DIAGNOSIS — Z6841 Body Mass Index (BMI) 40.0 and over, adult: Secondary | ICD-10-CM | POA: Diagnosis not present

## 2016-07-13 ENCOUNTER — Telehealth: Payer: Self-pay | Admitting: *Deleted

## 2016-07-13 NOTE — Telephone Encounter (Signed)
"  I need to speak with the genetic counselor.  I had testing several years back.  Need help because my mammograms always can't see anything."  Call transferred to genetic counselor line.

## 2016-07-14 DIAGNOSIS — Z6834 Body mass index (BMI) 34.0-34.9, adult: Secondary | ICD-10-CM | POA: Diagnosis not present

## 2016-07-14 DIAGNOSIS — M545 Low back pain: Secondary | ICD-10-CM | POA: Diagnosis not present

## 2016-07-14 DIAGNOSIS — F329 Major depressive disorder, single episode, unspecified: Secondary | ICD-10-CM | POA: Diagnosis not present

## 2016-07-14 DIAGNOSIS — K219 Gastro-esophageal reflux disease without esophagitis: Secondary | ICD-10-CM | POA: Diagnosis not present

## 2016-07-14 DIAGNOSIS — Z7689 Persons encountering health services in other specified circumstances: Secondary | ICD-10-CM | POA: Diagnosis not present

## 2016-07-18 DIAGNOSIS — M545 Low back pain: Secondary | ICD-10-CM | POA: Diagnosis not present

## 2016-07-21 DIAGNOSIS — M545 Low back pain: Secondary | ICD-10-CM | POA: Diagnosis not present

## 2016-07-21 DIAGNOSIS — M542 Cervicalgia: Secondary | ICD-10-CM | POA: Diagnosis not present

## 2016-07-24 DIAGNOSIS — N281 Cyst of kidney, acquired: Secondary | ICD-10-CM | POA: Diagnosis not present

## 2016-07-24 DIAGNOSIS — R351 Nocturia: Secondary | ICD-10-CM | POA: Diagnosis not present

## 2016-07-25 DIAGNOSIS — M545 Low back pain: Secondary | ICD-10-CM | POA: Diagnosis not present

## 2016-07-25 DIAGNOSIS — M542 Cervicalgia: Secondary | ICD-10-CM | POA: Diagnosis not present

## 2016-08-01 DIAGNOSIS — M545 Low back pain: Secondary | ICD-10-CM | POA: Diagnosis not present

## 2016-08-01 DIAGNOSIS — M542 Cervicalgia: Secondary | ICD-10-CM | POA: Diagnosis not present

## 2016-08-03 DIAGNOSIS — M542 Cervicalgia: Secondary | ICD-10-CM | POA: Diagnosis not present

## 2016-08-03 DIAGNOSIS — M545 Low back pain: Secondary | ICD-10-CM | POA: Diagnosis not present

## 2016-08-08 DIAGNOSIS — Z1231 Encounter for screening mammogram for malignant neoplasm of breast: Secondary | ICD-10-CM | POA: Diagnosis not present

## 2016-08-25 DIAGNOSIS — M545 Low back pain: Secondary | ICD-10-CM | POA: Diagnosis not present

## 2016-08-25 DIAGNOSIS — M542 Cervicalgia: Secondary | ICD-10-CM | POA: Diagnosis not present

## 2016-09-08 ENCOUNTER — Other Ambulatory Visit: Payer: Self-pay | Admitting: Obstetrics

## 2016-09-08 DIAGNOSIS — Z9189 Other specified personal risk factors, not elsewhere classified: Secondary | ICD-10-CM

## 2016-09-27 ENCOUNTER — Ambulatory Visit
Admission: RE | Admit: 2016-09-27 | Discharge: 2016-09-27 | Disposition: A | Discharge status: Home / Self Care | Payer: Medicare Other | Source: Ambulatory Visit | Attending: Obstetrics | Admitting: Obstetrics

## 2016-09-27 DIAGNOSIS — N6489 Other specified disorders of breast: Secondary | ICD-10-CM | POA: Diagnosis not present

## 2016-09-27 DIAGNOSIS — Z9189 Other specified personal risk factors, not elsewhere classified: Secondary | ICD-10-CM

## 2016-09-27 MED ORDER — GADOBENATE DIMEGLUMINE 529 MG/ML IV SOLN
18.0000 mL | Freq: Once | INTRAVENOUS | Status: AC | PRN
  Administered 2016-09-27: 18 mL via INTRAVENOUS

## 2016-09-28 DIAGNOSIS — Z779 Other contact with and (suspected) exposures hazardous to health: Secondary | ICD-10-CM | POA: Diagnosis not present

## 2016-09-28 DIAGNOSIS — R3129 Other microscopic hematuria: Secondary | ICD-10-CM | POA: Diagnosis not present

## 2016-09-29 DIAGNOSIS — F3170 Bipolar disorder, currently in remission, most recent episode unspecified: Secondary | ICD-10-CM | POA: Diagnosis not present

## 2016-10-12 ENCOUNTER — Telehealth: Payer: Self-pay | Admitting: Internal Medicine

## 2016-10-12 MED ORDER — CETIRIZINE HCL 10 MG PO TABS: 10.0000 mg | ORAL_TABLET | Freq: Every day | ORAL | 0 refills | Status: DC

## 2016-10-12 NOTE — Telephone Encounter (Signed)
Called pharmacy and called in pt's refill. They had no further questions. Nothing further is needed

## 2016-10-25 DIAGNOSIS — M6281 Muscle weakness (generalized): Secondary | ICD-10-CM | POA: Diagnosis not present

## 2016-10-25 DIAGNOSIS — R262 Difficulty in walking, not elsewhere classified: Secondary | ICD-10-CM | POA: Diagnosis not present

## 2016-10-25 DIAGNOSIS — E042 Nontoxic multinodular goiter: Secondary | ICD-10-CM | POA: Diagnosis not present

## 2016-10-25 DIAGNOSIS — R278 Other lack of coordination: Secondary | ICD-10-CM | POA: Diagnosis not present

## 2016-10-25 DIAGNOSIS — M549 Dorsalgia, unspecified: Secondary | ICD-10-CM | POA: Diagnosis not present

## 2016-10-30 DIAGNOSIS — M6281 Muscle weakness (generalized): Secondary | ICD-10-CM | POA: Diagnosis not present

## 2016-10-30 DIAGNOSIS — R262 Difficulty in walking, not elsewhere classified: Secondary | ICD-10-CM | POA: Diagnosis not present

## 2016-10-30 DIAGNOSIS — M549 Dorsalgia, unspecified: Secondary | ICD-10-CM | POA: Diagnosis not present

## 2016-10-30 DIAGNOSIS — R278 Other lack of coordination: Secondary | ICD-10-CM | POA: Diagnosis not present

## 2016-11-01 DIAGNOSIS — R262 Difficulty in walking, not elsewhere classified: Secondary | ICD-10-CM | POA: Diagnosis not present

## 2016-11-01 DIAGNOSIS — R278 Other lack of coordination: Secondary | ICD-10-CM | POA: Diagnosis not present

## 2016-11-01 DIAGNOSIS — M6281 Muscle weakness (generalized): Secondary | ICD-10-CM | POA: Diagnosis not present

## 2016-11-01 DIAGNOSIS — M549 Dorsalgia, unspecified: Secondary | ICD-10-CM | POA: Diagnosis not present

## 2016-11-07 ENCOUNTER — Encounter: Payer: Self-pay | Admitting: Family

## 2016-11-07 ENCOUNTER — Other Ambulatory Visit (INDEPENDENT_AMBULATORY_CARE_PROVIDER_SITE_OTHER): Payer: Medicare Other

## 2016-11-07 ENCOUNTER — Ambulatory Visit (INDEPENDENT_AMBULATORY_CARE_PROVIDER_SITE_OTHER): Payer: Medicare Other | Admitting: Family

## 2016-11-07 VITALS — BP 126/80 | HR 71 | Temp 98.5°F | Resp 16 | Ht 62.0 in | Wt 192.1 lb

## 2016-11-07 DIAGNOSIS — Z5181 Encounter for therapeutic drug level monitoring: Secondary | ICD-10-CM

## 2016-11-07 DIAGNOSIS — M542 Cervicalgia: Secondary | ICD-10-CM | POA: Diagnosis not present

## 2016-11-07 DIAGNOSIS — E213 Hyperparathyroidism, unspecified: Secondary | ICD-10-CM

## 2016-11-07 LAB — COMPREHENSIVE METABOLIC PANEL
ALK PHOS: 103 U/L (ref 39–117)
ALT: 37 U/L — ABNORMAL HIGH (ref 0–35)
AST: 20 U/L (ref 0–37)
Albumin: 4.4 g/dL (ref 3.5–5.2)
BUN: 8 mg/dL (ref 6–23)
CO2: 28 meq/L (ref 19–32)
CREATININE: 0.69 mg/dL (ref 0.40–1.20)
Calcium: 10.3 mg/dL (ref 8.4–10.5)
Chloride: 107 mEq/L (ref 96–112)
GFR: 111.79 mL/min (ref >60.00)
Glucose, Bld: 104 mg/dL — ABNORMAL HIGH (ref 70–99)
POTASSIUM: 3.9 meq/L (ref 3.5–5.1)
Sodium: 142 mEq/L (ref 135–145)
Total Bilirubin: 0.2 mg/dL (ref 0.2–1.2)
Total Protein: 7.2 g/dL (ref 6.0–8.3)

## 2016-11-07 MED ORDER — CETIRIZINE HCL 10 MG PO TABS: 10.0000 mg | ORAL_TABLET | Freq: Every day | ORAL | 1 refills | Status: DC

## 2016-11-07 NOTE — Progress Notes (Signed)
Subjective:    Patient ID: Brittany [name], female    DOB: February 01, 1956, 61 y.o.   MRN: [id number]  Chief Complaint  Patient presents with  . Follow-up    states when she was in [place] a couple of weeks ago she had a US done of her thyroid and was told it was a cyst, wants an MRI done to make sure that it is nothing else, wants calcium checked said it was high in [place]    HPI:  Brittany [name] is a 61 y.o. female who  has a past medical history of ACNE NEC; ALLERGIC RHINITIS; ALOPECIA; Anal fissure; Anxiety; Arthritis; Bowel obstruction ([place]); Chronic headaches; Constipation; DEPRESSION; Depression; DISORDER, BIPOLAR NEC; Family history of breast cancer; Family history of pancreatic cancer; GERD (gastroesophageal reflux disease); HEMORRHOIDS-INTERNAL; History of stress test; HYPERLIPIDEMIA; OBSTRUCTIVE SLEEP APNEA; and Seasonal allergies. and presents today for a follow up office visit.   1.) Neck pain - Evaluated in [place] at the [facility] with new onset neck pain. Was having symptoms radiating down bilateral arms and legs. Per patient an MRI was performed and there was a cyst located on her thyroid. She was instructed to follow up for repeat ultrasound in 6 months. Reports not available from previous provider. Continues to experience the associated symptom of pain. Does not recall what the results were but has started physical therapy.   2.) Elevated calcium - During her office visit her calcium was noted to be elevated with 24 urinary calcium being normal. Does have muscle twitching/spasm of the left upper back most likely unrelated.   Allergies  Allergen Reactions  . Flexeril [Cyclobenzaprine]     Unable to leave home when taking as she is incoherent/unsure where she is at times  . Hydrocodone     REACTION: GI upset      Outpatient Medications Prior to Visit  Medication Sig Dispense Refill  . azelastine (ASTELIN) 0.1 % nasal spray Place 2 sprays into both nostrils 2  (two) times daily. Use in each nostril as directed 30 mL 5  . buPROPion (WELLBUTRIN XL) 300 MG 24 hr tablet Take 300 mg by mouth every morning.    . Carbamazepine (EQUETRO) 300 MG CP12 Take 600 mg by mouth at bedtime.     . diclofenac sodium (VOLTAREN) 1 % GEL Apply 2 g topically 4 (four) times daily. 100 g 1  . lamoTRIgine (LAMICTAL) 150 MG tablet Take 300 mg by mouth daily.     . methocarbamol (ROBAXIN) 500 MG tablet Take 1 tablet (500 mg total) by mouth every 8 (eight) hours as needed for muscle spasms. 90 tablet 0  . mometasone (NASONEX) 50 MCG/ACT nasal spray Place 2 sprays into the nose daily. 17 g 5  . Multiple Vitamin (MULTIVITAMIN WITH MINERALS) TABS Take 1 tablet by mouth daily.    Marland Kitchen omeprazole (PRILOSEC) 40 MG capsule Take 40 mg by mouth daily.    . sertraline (ZOLOFT) 50 MG tablet Take 50 mg by mouth every morning.     . cetirizine (ZYRTEC) 10 MG tablet Take 1 tablet (10 mg total) by mouth daily. 90 tablet 0   No facility-administered medications prior to visit.      Review of Systems  Constitutional: Negative for chills and fever.  Respiratory: Negative for chest tightness and shortness of breath.   Cardiovascular: Negative for chest pain, palpitations and leg swelling.  Neurological: Negative for dizziness, tremors, weakness, light-headedness and numbness.      Objective:  BP 126/80 (BP Location: Left Arm, Patient Position: Sitting, Cuff Size: Large)   Pulse 71   Temp 98.5 F (36.9 C) (Oral)   Resp 16   Ht 5\' 2"  (1.575 m)   Wt 192 lb 1.9 oz (87.1 kg)   SpO2 96%   BMI 35.14 kg/m  Nursing note and vital signs reviewed.  Physical Exam  Constitutional: She is oriented to person, place, and time. She appears well-developed and well-nourished. No distress.  Neck: Neck supple. No tracheal deviation present. No thyromegaly present.  Cardiovascular: Normal rate, regular rhythm, normal heart sounds and intact distal pulses.   Pulmonary/Chest: Effort normal and breath  sounds normal.  Lymphadenopathy:    She has no cervical adenopathy.  Neurological: She is alert and oriented to person, place, and time.  Skin: Skin is warm and dry.  Psychiatric: She has a normal mood and affect. Her behavior is normal. Judgment and thought content normal.       Assessment & Plan:   Problem List Items Addressed This Visit      Endocrine   Hyperparathyroidism ([place]) - Primary    Noted to have elevated calcium during most recent blood work with urinary calcium being negative. Obtain BMET and PTH. Continue to monitor pending lab work results.       Relevant Orders   Comprehensive metabolic panel (Completed)   PTH, Intact and Calcium     Other   Neck pain    New onset neck pain appears musculoskeletal in nature with incidental findings of a cyst located on her thyroid. Per patient it was recommended to follow up with ultrasound monitoring in 6 months. Records will be requested. Continue to work with physical therapy. Follow up if symptoms worsen or do not improve.        Other Visit Diagnoses    Therapeutic drug monitoring       Relevant Orders   Comprehensive metabolic panel (Completed)   PTH, Intact and Calcium       I am having Ms. Pollok maintain her lamoTRIgine, sertraline, multivitamin with minerals, Carbamazepine, buPROPion, methocarbamol, diclofenac sodium, mometasone, azelastine, omeprazole, and cetirizine.   Meds ordered this encounter  Medications  . cetirizine (ZYRTEC) 10 MG tablet    Sig: Take 1 tablet (10 mg total) by mouth daily.    Dispense:  90 tablet    Refill:  1     Follow-up: Return in about 3 months (around 02/07/2017), or if symptoms worsen or fail to improve.  Mauricio Po, FNP

## 2016-11-07 NOTE — Patient Instructions (Signed)
Thank you for choosing [facility].  SUMMARY AND INSTRUCTIONS:  Medication:  Continue to take your medication as prescribed.   Labs:  Please stop by the lab on the lower level of the building for your blood work. Your results will be released to [place] (or called to you) after review, usually within 72 hours after test completion. If any changes need to be made, you will be notified at that same time.  1.) The lab is open from 7:30am to 5:30 pm Monday-Friday 2.) No appointment is necessary 3.) Fasting (if needed) is 6-8 hours after food and drink; black coffee and water are okay   Follow up:  If your symptoms worsen or fail to improve, please contact our office for further instruction, or in case of emergency go directly to the emergency room at the closest medical facility.

## 2016-11-07 NOTE — Assessment & Plan Note (Signed)
Noted to have elevated calcium during most recent blood work with urinary calcium being negative. Obtain BMET and PTH. Continue to monitor pending lab work results.

## 2016-11-07 NOTE — Assessment & Plan Note (Signed)
New onset neck pain appears musculoskeletal in nature with incidental findings of a cyst located on her thyroid. Per patient it was recommended to follow up with ultrasound monitoring in 6 months. Records will be requested. Continue to work with physical therapy. Follow up if symptoms worsen or do not improve.

## 2016-11-08 LAB — PTH, INTACT AND CALCIUM
CALCIUM: 10.1 mg/dL (ref 8.6–10.4)
PTH: 53 pg/mL (ref 14–64)

## 2016-11-23 DIAGNOSIS — R262 Difficulty in walking, not elsewhere classified: Secondary | ICD-10-CM | POA: Diagnosis not present

## 2016-11-23 DIAGNOSIS — M549 Dorsalgia, unspecified: Secondary | ICD-10-CM | POA: Diagnosis not present

## 2016-11-23 DIAGNOSIS — M6281 Muscle weakness (generalized): Secondary | ICD-10-CM | POA: Diagnosis not present

## 2016-11-23 DIAGNOSIS — R278 Other lack of coordination: Secondary | ICD-10-CM | POA: Diagnosis not present

## 2016-11-28 DIAGNOSIS — M549 Dorsalgia, unspecified: Secondary | ICD-10-CM | POA: Diagnosis not present

## 2016-11-28 DIAGNOSIS — M6281 Muscle weakness (generalized): Secondary | ICD-10-CM | POA: Diagnosis not present

## 2016-11-28 DIAGNOSIS — R262 Difficulty in walking, not elsewhere classified: Secondary | ICD-10-CM | POA: Diagnosis not present

## 2016-11-28 DIAGNOSIS — R278 Other lack of coordination: Secondary | ICD-10-CM | POA: Diagnosis not present

## 2016-11-30 DIAGNOSIS — R278 Other lack of coordination: Secondary | ICD-10-CM | POA: Diagnosis not present

## 2016-11-30 DIAGNOSIS — R262 Difficulty in walking, not elsewhere classified: Secondary | ICD-10-CM | POA: Diagnosis not present

## 2016-11-30 DIAGNOSIS — M549 Dorsalgia, unspecified: Secondary | ICD-10-CM | POA: Diagnosis not present

## 2016-11-30 DIAGNOSIS — M6281 Muscle weakness (generalized): Secondary | ICD-10-CM | POA: Diagnosis not present

## 2016-12-05 DIAGNOSIS — M6281 Muscle weakness (generalized): Secondary | ICD-10-CM | POA: Diagnosis not present

## 2016-12-05 DIAGNOSIS — R278 Other lack of coordination: Secondary | ICD-10-CM | POA: Diagnosis not present

## 2016-12-05 DIAGNOSIS — R262 Difficulty in walking, not elsewhere classified: Secondary | ICD-10-CM | POA: Diagnosis not present

## 2016-12-05 DIAGNOSIS — M549 Dorsalgia, unspecified: Secondary | ICD-10-CM | POA: Diagnosis not present

## 2016-12-26 DIAGNOSIS — M549 Dorsalgia, unspecified: Secondary | ICD-10-CM | POA: Diagnosis not present

## 2016-12-26 DIAGNOSIS — R262 Difficulty in walking, not elsewhere classified: Secondary | ICD-10-CM | POA: Diagnosis not present

## 2016-12-26 DIAGNOSIS — R278 Other lack of coordination: Secondary | ICD-10-CM | POA: Diagnosis not present

## 2016-12-26 DIAGNOSIS — M6281 Muscle weakness (generalized): Secondary | ICD-10-CM | POA: Diagnosis not present

## 2016-12-28 DIAGNOSIS — M6281 Muscle weakness (generalized): Secondary | ICD-10-CM | POA: Diagnosis not present

## 2016-12-28 DIAGNOSIS — R278 Other lack of coordination: Secondary | ICD-10-CM | POA: Diagnosis not present

## 2016-12-28 DIAGNOSIS — M549 Dorsalgia, unspecified: Secondary | ICD-10-CM | POA: Diagnosis not present

## 2016-12-28 DIAGNOSIS — R262 Difficulty in walking, not elsewhere classified: Secondary | ICD-10-CM | POA: Diagnosis not present

## 2017-01-02 DIAGNOSIS — R278 Other lack of coordination: Secondary | ICD-10-CM | POA: Diagnosis not present

## 2017-01-02 DIAGNOSIS — R262 Difficulty in walking, not elsewhere classified: Secondary | ICD-10-CM | POA: Diagnosis not present

## 2017-01-02 DIAGNOSIS — M549 Dorsalgia, unspecified: Secondary | ICD-10-CM | POA: Diagnosis not present

## 2017-01-02 DIAGNOSIS — M6281 Muscle weakness (generalized): Secondary | ICD-10-CM | POA: Diagnosis not present

## 2017-01-09 DIAGNOSIS — M6281 Muscle weakness (generalized): Secondary | ICD-10-CM | POA: Diagnosis not present

## 2017-01-09 DIAGNOSIS — R262 Difficulty in walking, not elsewhere classified: Secondary | ICD-10-CM | POA: Diagnosis not present

## 2017-01-09 DIAGNOSIS — M549 Dorsalgia, unspecified: Secondary | ICD-10-CM | POA: Diagnosis not present

## 2017-01-09 DIAGNOSIS — R278 Other lack of coordination: Secondary | ICD-10-CM | POA: Diagnosis not present

## 2017-01-16 DIAGNOSIS — R262 Difficulty in walking, not elsewhere classified: Secondary | ICD-10-CM | POA: Diagnosis not present

## 2017-01-16 DIAGNOSIS — M6281 Muscle weakness (generalized): Secondary | ICD-10-CM | POA: Diagnosis not present

## 2017-01-16 DIAGNOSIS — M549 Dorsalgia, unspecified: Secondary | ICD-10-CM | POA: Diagnosis not present

## 2017-01-16 DIAGNOSIS — R278 Other lack of coordination: Secondary | ICD-10-CM | POA: Diagnosis not present

## 2017-01-23 ENCOUNTER — Ambulatory Visit: Payer: Medicare Other

## 2017-01-23 NOTE — Progress Notes (Addendum)
Subjective:   Brittany [name] is a 61 y.o. female who presents for Medicare Annual (Subsequent) preventive examination.  Review of Systems:  No ROS.  Medicare Wellness Visit. Additional risk factors are reflected in the social history.  Cardiac Risk Factors include: advanced age (>58men, >60 women);dyslipidemia Sleep patterns: feels rested on waking, gets up 1 times nightly to void and sleeps 7-8 hours nightly.    Home Safety/Smoke Alarms: Feels safe in home. Smoke alarms in place.  Living environment; residence and Firearm Safety: 1-story house/ trailer, no firearms. Lives with husband, no needs for DME, good support system Seat Belt Safety/Bike Helmet: Wears seat belt.   Counseling:   Eye Exam- appointment yearly Dental- appointment every 6 months  Female:   Pap- Last 08/17/16 per patient      Mammo- Last 09/27/16,  BI-RADS category 1: negative      Dexa scan- Last 12/25/13      CCS- Last 10/03/11, recall 10 years     Objective:     Vitals: BP 112/72   Pulse 70   Resp 20   Ht 5\' 2"  (1.575 m)   Wt 191 lb (86.6 kg)   SpO2 99%   BMI 34.93 kg/m   Body mass index is 34.93 kg/m.   Tobacco History  Smoking Status  . Former Smoker  . Packs/day: 0.10  . Years: 7.00  . Types: Cigarettes  . Quit date: 06/19/1981  Smokeless Tobacco  . Never Used     Counseling given: Not Answered   Past Medical History:  Diagnosis Date  . ACNE NEC   . ALLERGIC RHINITIS   . ALOPECIA   . Anal fissure   . Anxiety    Panic Attack.  None in a long time  . Arthritis   . Bowel obstruction ([place])   . Chronic headaches    migraine- none in years  . Constipation    drinks tea with senna  . DEPRESSION   . Depression   . DISORDER, BIPOLAR NEC   . Family history of breast cancer   . Family history of pancreatic cancer   . GERD (gastroesophageal reflux disease)   . HEMORRHOIDS-INTERNAL   . History of stress test    ETT 10/17: Ex 7'; no chest pain, no ST changes, Duke Treadmill Score 7  .  HYPERLIPIDEMIA   . OBSTRUCTIVE SLEEP APNEA   . Seasonal allergies    Past Surgical History:  Procedure Laterality Date  . BREAST LUMPECTOMY Right last done 2005   x 2  . CHOLECYSTECTOMY N/A 03/07/2014   Procedure: LAPAROSCOPIC CHOLECYSTECTOMY WITH INTRAOPERATIVE CHOLANGIOGRAM;  Surgeon: Fanny Skates, MD;  Location: [facility];  Service: General;  Laterality: N/A;  . EUS N/A 01/27/2014   Procedure: ESOPHAGEAL ENDOSCOPIC ULTRASOUND (EUS) RADIAL;  Surgeon: Arta Silence, MD;  Location: [facility];  Service: Endoscopy;  Laterality: N/A;  . HAND SURGERY Bilateral    for arthritis  left 02/02/14- right approx 2014  . KNEE SURGERY Bilateral    right x 2 left x 1.  arthroscopy  . RADICAL HYSTERECTOMY    . thumb and pinkie finger surgery Right 2014  . TONSILLECTOMY     Family History  Problem Relation Age of Onset  . Other Paternal Aunt        x 2, blood clotting disorder  . Breast cancer Paternal Aunt 81  . Heart disease Sister   . Kidney disease Maternal Uncle   . Esophageal cancer Paternal Uncle   . Pancreatic cancer  Paternal Uncle   . COPD Father   . Breast cancer Mother 38  . Breast cancer Maternal Grandmother 46  . Stroke Maternal Grandfather   . Aneurysm Paternal Grandmother        brain  . Hypertension Brother   . Breast cancer Maternal Aunt        dx in her 56s  . Breast cancer Maternal Aunt        dx in her 48s  . Cancer Maternal Uncle        NOS  . Cancer Maternal Uncle        tumor on his head  . Breast cancer Paternal Aunt        dx in her 79s  . Breast cancer Paternal Aunt        dx in her 59s  . Lung cancer Paternal Uncle   . Breast cancer Cousin 15       paternal first cousin  . Colon cancer Maternal Uncle   . Colon polyps Maternal Uncle   . Irritable bowel syndrome Cousin   . Rectal cancer Neg Hx   . Stomach cancer Neg Hx    History  Sexual Activity  . Sexual activity: Not on file    Outpatient Encounter Prescriptions as of 01/24/2017  Medication Sig    . azelastine (ASTELIN) 0.1 % nasal spray Place 2 sprays into both nostrils 2 (two) times daily. Use in each nostril as directed  . buPROPion (WELLBUTRIN XL) 300 MG 24 hr tablet Take 300 mg by mouth every morning.  . Carbamazepine (EQUETRO) 300 MG CP12 Take 600 mg by mouth at bedtime.   . cetirizine (ZYRTEC) 10 MG tablet Take 1 tablet (10 mg total) by mouth daily.  . diclofenac sodium (VOLTAREN) 1 % GEL Apply 2 g topically 4 (four) times daily.  Marland Kitchen lamoTRIgine (LAMICTAL) 150 MG tablet Take 300 mg by mouth daily.   . methocarbamol (ROBAXIN) 500 MG tablet Take 1 tablet (500 mg total) by mouth every 8 (eight) hours as needed for muscle spasms.  . mometasone (NASONEX) 50 MCG/ACT nasal spray Place 2 sprays into the nose daily.  . Multiple Vitamin (MULTIVITAMIN WITH MINERALS) TABS Take 1 tablet by mouth daily.  Marland Kitchen omeprazole (PRILOSEC) 40 MG capsule Take 40 mg by mouth daily.  . sertraline (ZOLOFT) 50 MG tablet Take 50 mg by mouth every morning.    No facility-administered encounter medications on file as of 01/24/2017.     Activities of Daily Living In your present state of health, do you have any difficulty performing the following activities: 01/24/2017  Hearing? Y  Vision? N  Difficulty concentrating or making decisions? N  Walking or climbing stairs? Y  Dressing or bathing? N  Doing errands, shopping? N  Preparing Food and eating ? N  Using the Toilet? N  In the past six months, have you accidently leaked urine? N  Do you have problems with loss of bowel control? N  Managing your Medications? N  Managing your Finances? N  Housekeeping or managing your Housekeeping? N  Some recent data might be hidden    Patient Care Team: Golden Circle, FNP as PCP - General (Family Medicine)    Assessment:    Physical assessment deferred to PCP.  Exercise Activities and Dietary recommendations Current Exercise Habits: The patient does not participate in regular exercise at present;Home exercise  routine, Type of exercise: walking, Time (Minutes): 30, Frequency (Times/Week): 6, Weekly Exercise (Minutes/Week): 180, Intensity: Mild, Exercise limited  by: orthopedic condition(s)  Diet (meal preparation, eat out, water intake, caffeinated beverages, dairy products, fruits and vegetables): in general, a "healthy" diet  , well balanced, eats a variety of fruits and vegetables daily, limits salt, fat/cholesterol, sugar, caffeine, drinks 6-8 glasses of water daily.  Reviewed heart healthy, portion control, weight loss tips. Relevant patient education assigned to patient using Emmi.     Goals    . lose weight          Continue to walk and exercise as tolerated due to my severe back issues, and eat healthy      Fall Risk Fall Risk  01/24/2017 04/20/2015  Falls in the past year? Yes No  Number falls in past yr: 1 -  Injury with Fall? Yes -  Follow up Falls prevention discussed -   Depression Screen PHQ 2/9 Scores 01/24/2017 04/20/2015  PHQ - 2 Score 1 6  PHQ- 9 Score 1 18     Cognitive Function       Ad8 score reviewed for issues:  Issues making decisions: no  Less interest in hobbies / activities: no  Repeats questions, stories (family complaining): no  Trouble using ordinary gadgets (microwave, computer, phone):no  Forgets the month or year: no  Mismanaging finances: no  Remembering appts: no  Daily problems with thinking and/or memory: no Ad8 score is= 0  Immunization History  Administered Date(s) Administered  . Tdap 01/24/2012   Screening Tests Health Maintenance  Topic Date Due  . Hepatitis C Screening  1956-04-22  . PAP SMEAR  09/13/2014  . MAMMOGRAM  11/14/2015  . INFLUENZA VACCINE  01/17/2017  . COLONOSCOPY  10/02/2021  . TETANUS/TDAP  01/23/2022  . HIV Screening  Completed      Plan:     Scheduled appointment with PCP 01/25/17 to discuss medical concerns.  Continue doing brain stimulating activities (puzzles, reading, adult coloring books, staying  active) to keep memory sharp.    Continue to eat heart healthy diet (full of fruits, vegetables, whole grains, lean protein, water--limit salt, fat, and sugar intake) and increase physical activity as tolerated.  I have personally reviewed and noted the following in the patient's chart:   . Medical and social history . Use of alcohol, tobacco or illicit drugs  . Current medications and supplements . Functional ability and status . Nutritional status . Physical activity . Advanced directives . List of other physicians . Vitals . Screenings to include cognitive, depression, and falls . Referrals and appointments  In addition, I have reviewed and discussed with patient certain preventive protocols, quality metrics, and best practice recommendations. A written personalized care plan for preventive services as well as general preventive health recommendations were provided to patient.     Michiel Cowboy, RN  01/24/2017   Medical screening examination/treatment/procedure(s) were performed by non-physician practitioner and as supervising provider I was immediately available for consultation/collaboration. I agree with treatment plan. Mauricio Po, FNP

## 2017-01-23 NOTE — Progress Notes (Signed)
Pre visit review using our clinic review tool, if applicable. No additional management support is needed unless otherwise documented below in the visit note. 

## 2017-01-24 ENCOUNTER — Ambulatory Visit (INDEPENDENT_AMBULATORY_CARE_PROVIDER_SITE_OTHER): Payer: Medicare Other | Admitting: *Deleted

## 2017-01-24 VITALS — BP 112/72 | HR 70 | Resp 20 | Ht 62.0 in | Wt 191.0 lb

## 2017-01-24 DIAGNOSIS — Z0000 Encounter for general adult medical examination without abnormal findings: Secondary | ICD-10-CM

## 2017-01-24 NOTE — Patient Instructions (Signed)
Continue doing brain stimulating activities (puzzles, reading, adult coloring books, staying active) to keep memory sharp.    Continue to eat heart healthy diet (full of fruits, vegetables, whole grains, lean protein, water--limit salt, fat, and sugar intake) and increase physical activity as tolerated.   Brittany [name] , Thank you for taking time to come for your Medicare Wellness Visit. I appreciate your ongoing commitment to your health goals. Please review the following plan we discussed and let me know if I can assist you in the future.   These are the goals we discussed: Goals    . lose weight          Continue to walk and exercise as tolerated due to my severe back issues, and eat healthy       This is a list of the screening recommended for you and due dates:  Health Maintenance  Topic Date Due  .  Hepatitis C: One time screening is recommended by Center for Disease Control  (CDC) for  adults born from [birth year] through 1964.   [birth year]  . Pap Smear  09/13/2014  . Mammogram  11/14/2015  . Flu Shot  01/17/2017  . Colon Cancer Screening  10/02/2021  . Tetanus Vaccine  01/23/2022  . HIV Screening  Completed

## 2017-01-25 ENCOUNTER — Ambulatory Visit: Payer: Medicare Other | Admitting: Family

## 2017-01-25 ENCOUNTER — Encounter: Payer: Self-pay | Admitting: *Deleted

## 2017-01-26 ENCOUNTER — Encounter: Payer: Self-pay | Admitting: Family

## 2017-01-26 ENCOUNTER — Other Ambulatory Visit (INDEPENDENT_AMBULATORY_CARE_PROVIDER_SITE_OTHER): Payer: Medicare Other

## 2017-01-26 ENCOUNTER — Ambulatory Visit (INDEPENDENT_AMBULATORY_CARE_PROVIDER_SITE_OTHER): Payer: Medicare Other | Admitting: Family

## 2017-01-26 VITALS — BP 118/82 | HR 78 | Temp 98.7°F | Resp 16 | Ht 62.0 in | Wt 196.1 lb

## 2017-01-26 DIAGNOSIS — G44209 Tension-type headache, unspecified, not intractable: Secondary | ICD-10-CM

## 2017-01-26 DIAGNOSIS — E213 Hyperparathyroidism, unspecified: Secondary | ICD-10-CM

## 2017-01-26 DIAGNOSIS — R51 Headache: Secondary | ICD-10-CM

## 2017-01-26 DIAGNOSIS — R519 Headache, unspecified: Secondary | ICD-10-CM | POA: Insufficient documentation

## 2017-01-26 LAB — COMPREHENSIVE METABOLIC PANEL
ALBUMIN: 4.4 g/dL (ref 3.5–5.2)
ALT: 52 U/L — AB (ref 0–35)
AST: 26 U/L (ref 0–37)
Alkaline Phosphatase: 117 U/L (ref 39–117)
BILIRUBIN TOTAL: 0.3 mg/dL (ref 0.2–1.2)
BUN: 7 mg/dL (ref 6–23)
CALCIUM: 10 mg/dL (ref 8.4–10.5)
CHLORIDE: 108 meq/L (ref 96–112)
CO2: 28 mEq/L (ref 19–32)
CREATININE: 0.65 mg/dL (ref 0.40–1.20)
GFR: 119.68 mL/min (ref >60.00)
Glucose, Bld: 94 mg/dL (ref 70–99)
Potassium: 4.1 mEq/L (ref 3.5–5.1)
SODIUM: 140 meq/L (ref 135–145)
Total Protein: 7.3 g/dL (ref 6.0–8.3)

## 2017-01-26 LAB — SEDIMENTATION RATE: Sed Rate: 13 mm/hr (ref 0–30)

## 2017-01-26 LAB — C-REACTIVE PROTEIN: CRP: 0.4 mg/dL — ABNORMAL LOW (ref 0.5–20.0)

## 2017-01-26 NOTE — Patient Instructions (Addendum)
Thank you for choosing [facility].  SUMMARY AND INSTRUCTIONS:  Please continue to take your medication as prescribed.  A referral to Endocrinology for the hyperparathyroid has been sent and you should hear soon.  Continue with Tylenol as needed for headaches.  We will check your blood work to check your calcium and for any inflammation.   If your headaches worsen please let us know.   Labs:  Please stop by the lab on the lower level of the building for your blood work. Your results will be released to [facility] (or called to you) after review, usually within 72 hours after test completion. If any changes need to be made, you will be notified at that same time.  1.) The lab is open from 7:30am to 5:30 pm Monday-Friday 2.) No appointment is necessary 3.) Fasting (if needed) is 6-8 hours after food and drink; black coffee and water are okay   Follow up:  If your symptoms worsen or fail to improve, please contact our office for further instruction, or in case of emergency go directly to the emergency room at the closest medical facility.

## 2017-01-26 NOTE — Assessment & Plan Note (Signed)
New onset headaches most likely related to stress with normal neurological exam. Minimal symptoms concerning for arteritis. Will check CRP and ESR. Continue conservative treatment with OTC medications as needed for symptom relief and follow up if frequency or intensity increase.

## 2017-01-26 NOTE — Assessment & Plan Note (Signed)
Previously diagnosed with hyperparathyroidism and has remained stable with no medications. Obtain PTH and calcium. Refer to endocrinology for further assessment and treatment.

## 2017-01-26 NOTE — Progress Notes (Signed)
Subjective:    Patient ID: Brittany [name], female    DOB: [birth year], 61 y.o.   MRN: [id number]  Chief Complaint  Patient presents with  . Follow-up    wants an MRI done of thyroid     HPI:  Brittany [name] is a 61 y.o. female who  has a past medical history of ACNE NEC; ALLERGIC RHINITIS; ALOPECIA; Anal fissure; Anxiety; Arthritis; Bowel obstruction ([place]); Chronic headaches; Constipation; DEPRESSION; Depression; DISORDER, BIPOLAR NEC; Family history of breast cancer; Family history of pancreatic cancer; GERD (gastroesophageal reflux disease); HEMORRHOIDS-INTERNAL; History of stress test; HYPERLIPIDEMIA; OBSTRUCTIVE SLEEP APNEA; and Seasonal allergies. and presents today for a follow up office visit.   1.) Headaches - This is a new problem. Associated symptom of a headache located towards the right side of her head around her temple has been going on for about 4-5 months and described as sharp at times with the timing of the symptoms worse in the mornings that gradually improves. Ensoreses that she is under a significant amount of stress. Denies sensitivity to light or sound, or associated nausea or vomiting. Denies worst headache of life, changes to vision, or jaw claudication.   2.) Hyperparathyroidism - Previously diagnosed with hyperparathyroidism with elevated calcium levels with most recent blood work showing normal calcium and PTH levels. No current symptoms. She has concerns for her thyroid and would like an MRI of her thyroid.   Allergies  Allergen Reactions  . Flexeril [Cyclobenzaprine]     Unable to leave home when taking as she is incoherent/unsure where she is at times  . Hydrocodone     REACTION: GI upset      Outpatient Medications Prior to Visit  Medication Sig Dispense Refill  . azelastine (ASTELIN) 0.1 % nasal spray Place 2 sprays into both nostrils 2 (two) times daily. Use in each nostril as directed 30 mL 5  . buPROPion (WELLBUTRIN XL) 300 MG 24 hr tablet Take  300 mg by mouth every morning.    . Carbamazepine (EQUETRO) 300 MG CP12 Take 600 mg by mouth at bedtime.     . cetirizine (ZYRTEC) 10 MG tablet Take 1 tablet (10 mg total) by mouth daily. 90 tablet 1  . diclofenac sodium (VOLTAREN) 1 % GEL Apply 2 g topically 4 (four) times daily. 100 g 1  . lamoTRIgine (LAMICTAL) 150 MG tablet Take 300 mg by mouth daily.     . methocarbamol (ROBAXIN) 500 MG tablet Take 1 tablet (500 mg total) by mouth every 8 (eight) hours as needed for muscle spasms. 90 tablet 0  . mometasone (NASONEX) 50 MCG/ACT nasal spray Place 2 sprays into the nose daily. 17 g 5  . Multiple Vitamin (MULTIVITAMIN WITH MINERALS) TABS Take 1 tablet by mouth daily.    Marland Kitchen omeprazole (PRILOSEC) 40 MG capsule Take 40 mg by mouth daily.    . sertraline (ZOLOFT) 50 MG tablet Take 50 mg by mouth every morning.      No facility-administered medications prior to visit.     Past Medical History:  Diagnosis Date  . ACNE NEC   . ALLERGIC RHINITIS   . ALOPECIA   . Anal fissure   . Anxiety    Panic Attack.  None in a long time  . Arthritis   . Bowel obstruction ([place])   . Chronic headaches    migraine- none in years  . Constipation    drinks tea with senna  . DEPRESSION   . Depression   .  DISORDER, BIPOLAR NEC   . Family history of breast cancer   . Family history of pancreatic cancer   . GERD (gastroesophageal reflux disease)   . HEMORRHOIDS-INTERNAL   . History of stress test    ETT 10/17: Ex 7'; no chest pain, no ST changes, Duke Treadmill Score 7  . HYPERLIPIDEMIA   . OBSTRUCTIVE SLEEP APNEA   . Seasonal allergies       Review of Systems  Constitutional: Negative for appetite change, chills, fatigue, fever and unexpected weight change.  Respiratory: Negative for chest tightness, shortness of breath and wheezing.   Cardiovascular: Negative for chest pain, palpitations and leg swelling.  Neurological: Positive for headaches. Negative for dizziness, weakness and numbness.        Objective:    BP 118/82 (BP Location: Left Arm, Patient Position: Sitting, Cuff Size: Large)   Pulse 78   Temp 98.7 F (37.1 C) (Oral)   Resp 16   Ht 5' 2"  (1.575 m)   Wt 196 lb 1.9 oz (89 kg)   SpO2 96%   BMI 35.87 kg/m  Nursing note and vital signs reviewed.  Physical Exam  Constitutional: She is oriented to person, place, and time. She appears well-developed and well-nourished. No distress.  HENT:  Right Ear: Hearing, tympanic membrane, external ear and ear canal normal.  Left Ear: Hearing, tympanic membrane, external ear and ear canal normal.  Nose: Nose normal.  Mouth/Throat: Uvula is midline, oropharynx is clear and moist and mucous membranes are normal.  Eyes: Pupils are equal, round, and reactive to light. Conjunctivae and EOM are normal.  Neck: Normal range of motion. Neck supple. No thyromegaly present.  Cardiovascular: Normal rate, regular rhythm, normal heart sounds and intact distal pulses.   Pulmonary/Chest: Effort normal and breath sounds normal.  Lymphadenopathy:    She has no cervical adenopathy.  Neurological: She is alert and oriented to person, place, and time. No cranial nerve deficit.  Skin: Skin is warm and dry.  Psychiatric: She has a normal mood and affect. Her behavior is normal. Judgment and thought content normal.       Assessment & Plan:   Problem List Items Addressed This Visit      Endocrine   Hyperparathyroidism ([place])    Previously diagnosed with hyperparathyroidism and has remained stable with no medications. Obtain PTH and calcium. Refer to endocrinology for further assessment and treatment.       Relevant Orders   Ambulatory referral to Endocrinology   PTH, Intact and Calcium     Other   Headache - Primary    New onset headaches most likely related to stress with normal neurological exam. Minimal symptoms concerning for arteritis. Will check CRP and ESR. Continue conservative treatment with OTC medications as needed for symptom  relief and follow up if frequency or intensity increase.       Relevant Orders   Sed Rate (ESR) (Completed)   C-reactive protein (Completed)   Comp Met (CMET) (Completed)       I am having Ms. Schreier maintain her lamoTRIgine, sertraline, multivitamin with minerals, Carbamazepine, buPROPion, methocarbamol, diclofenac sodium, mometasone, azelastine, omeprazole, and cetirizine.   Follow-up: Return in about 3 months (around 04/28/2017), or if symptoms worsen or fail to improve.  Mauricio Po, FNP

## 2017-01-29 LAB — PTH, INTACT AND CALCIUM
Calcium: 10.2 mg/dL (ref 8.6–10.4)
PTH: 65 pg/mL — ABNORMAL HIGH (ref 14–64)

## 2017-01-31 ENCOUNTER — Telehealth: Payer: Self-pay | Admitting: Family

## 2017-01-31 NOTE — Telephone Encounter (Signed)
Pt called for her results from 8/10 Please call back

## 2017-01-31 NOTE — Telephone Encounter (Signed)
Please inform patient that her kidney function, liver function, electrolytes and calcium are normal. Her parathyroid hormone level is 1 point elevated which is not significant. Her inflammation markers are normal.

## 2017-02-01 NOTE — Telephone Encounter (Signed)
Pt informed expressed understanding, and requested mail copy, will print and mail today

## 2017-02-16 DIAGNOSIS — R262 Difficulty in walking, not elsewhere classified: Secondary | ICD-10-CM | POA: Diagnosis not present

## 2017-02-16 DIAGNOSIS — M6281 Muscle weakness (generalized): Secondary | ICD-10-CM | POA: Diagnosis not present

## 2017-02-16 DIAGNOSIS — M549 Dorsalgia, unspecified: Secondary | ICD-10-CM | POA: Diagnosis not present

## 2017-02-16 DIAGNOSIS — R278 Other lack of coordination: Secondary | ICD-10-CM | POA: Diagnosis not present

## 2017-03-28 ENCOUNTER — Other Ambulatory Visit: Payer: Self-pay | Admitting: Adult Health

## 2017-04-05 ENCOUNTER — Ambulatory Visit: Payer: Medicare Other | Admitting: Internal Medicine

## 2017-04-16 ENCOUNTER — Ambulatory Visit: Payer: Medicare Other | Admitting: Endocrinology

## 2017-04-27 DIAGNOSIS — F3170 Bipolar disorder, currently in remission, most recent episode unspecified: Secondary | ICD-10-CM | POA: Diagnosis not present

## 2017-05-14 DIAGNOSIS — M7062 Trochanteric bursitis, left hip: Secondary | ICD-10-CM | POA: Diagnosis not present

## 2017-05-14 DIAGNOSIS — M1711 Unilateral primary osteoarthritis, right knee: Secondary | ICD-10-CM | POA: Diagnosis not present

## 2017-06-05 DIAGNOSIS — Z9079 Acquired absence of other genital organ(s): Secondary | ICD-10-CM | POA: Insufficient documentation

## 2017-06-21 DIAGNOSIS — E7849 Other hyperlipidemia: Secondary | ICD-10-CM | POA: Insufficient documentation

## 2017-07-03 ENCOUNTER — Ambulatory Visit: Payer: Medicare Other | Admitting: Family Medicine

## 2017-07-03 DIAGNOSIS — S4352XA Sprain of left acromioclavicular joint, initial encounter: Secondary | ICD-10-CM | POA: Diagnosis not present

## 2017-07-03 DIAGNOSIS — S199XXA Unspecified injury of neck, initial encounter: Secondary | ICD-10-CM | POA: Diagnosis not present

## 2017-07-03 DIAGNOSIS — R51 Headache: Secondary | ICD-10-CM | POA: Diagnosis not present

## 2017-07-03 DIAGNOSIS — M25711 Osteophyte, right shoulder: Secondary | ICD-10-CM | POA: Diagnosis not present

## 2017-07-03 DIAGNOSIS — G4489 Other headache syndrome: Secondary | ICD-10-CM | POA: Diagnosis not present

## 2017-07-03 DIAGNOSIS — S0990XA Unspecified injury of head, initial encounter: Secondary | ICD-10-CM | POA: Diagnosis not present

## 2017-07-03 DIAGNOSIS — M542 Cervicalgia: Secondary | ICD-10-CM | POA: Diagnosis not present

## 2017-07-03 DIAGNOSIS — M19012 Primary osteoarthritis, left shoulder: Secondary | ICD-10-CM | POA: Diagnosis not present

## 2017-07-03 DIAGNOSIS — M19011 Primary osteoarthritis, right shoulder: Secondary | ICD-10-CM | POA: Diagnosis not present

## 2017-07-03 DIAGNOSIS — M25512 Pain in left shoulder: Secondary | ICD-10-CM | POA: Diagnosis not present

## 2017-07-03 DIAGNOSIS — S4992XA Unspecified injury of left shoulder and upper arm, initial encounter: Secondary | ICD-10-CM | POA: Diagnosis not present

## 2017-07-05 DIAGNOSIS — M1711 Unilateral primary osteoarthritis, right knee: Secondary | ICD-10-CM | POA: Diagnosis not present

## 2017-07-06 ENCOUNTER — Other Ambulatory Visit: Payer: Self-pay | Admitting: Urology

## 2017-07-06 DIAGNOSIS — D49511 Neoplasm of unspecified behavior of right kidney: Secondary | ICD-10-CM

## 2017-07-18 ENCOUNTER — Ambulatory Visit: Payer: Medicare Other | Admitting: Internal Medicine

## 2017-07-19 ENCOUNTER — Ambulatory Visit: Payer: Medicare Other | Admitting: Internal Medicine

## 2017-07-19 ENCOUNTER — Ambulatory Visit (HOSPITAL_COMMUNITY): Payer: Medicare Other

## 2017-07-19 NOTE — Progress Notes (Deleted)
Patient ID: Brittany [name], female   DOB: 06/18/1956, 62 y.o.   MRN: [id number]    HPI  Brittany [name] is a 62 y.o.-year-old female, referred by her PCP, Golden Circle, FNP, for evaluation for hypercalcemia/hyperparathyroidism.  She saw Dr. Dwyane Dee in the past, in 2014.  Pt has a h/o hypercalcemia at least since 2014. I reviewed pt's pertinent labs: Lab Results  Component Value Date   PTH 65 (H) 01/26/2017   PTH 53 11/07/2016   PTH 42.0 01/15/2013   PTH 39.9 11/04/2012   CALCIUM 10.2 01/26/2017   CALCIUM 10.0 01/26/2017   CALCIUM 10.3 11/07/2016   CALCIUM 10.1 11/07/2016   CALCIUM 10.7 (H) 04/03/2016   CALCIUM 10.6 (H) 02/15/2015   CALCIUM 10.1 03/07/2014   CALCIUM 10.7 (H) 01/13/2014   CALCIUM 11.1 (H) 06/05/2013   CALCIUM 10.6 (H) 01/15/2013   I reviewed pt's DEXA scans: Date L1-L4 T score FN T score 33% distal Radius  11/13/2013 ([facility]) -0.9 RFN: -0.3 LFN: -0.9  -1.2   No fractures or falls.   No h/o kidney stones.  No h/o CKD. Last BUN/Cr: Lab Results  Component Value Date   BUN 7 01/26/2017   BUN 8 11/07/2016   CREATININE 0.65 01/26/2017   CREATININE 0.69 11/07/2016   Pt is not on HCTZ.  She is on lithium.  No h/o vitamin D deficiency. Reviewed vit D levels: No results found for: VD25OH   She had an increase calcitriol level at 79 (upper limit of normal 72) in 02/15/2015.  24-hour urine calcium was normal per PCP note, but I cannot find the absolute result in the chart.  Pt is on calcium and vitamin D; she also eats dairy and green, leafy, vegetables.  Patient's mother has MEN sd.  (Hyperparathyroidism and questionable pituitary tumor).  Pt. also has a history of thyroid cyst observed on neck MRI.  ROS: Constitutional: no weight gain/loss, no fatigue, no subjective hyperthermia/hypothermia Eyes: no blurry vision, no xerophthalmia ENT: no sore throat, no nodules palpated in throat, no dysphagia/odynophagia, no hoarseness Cardiovascular: no  CP/SOB/palpitations/leg swelling Respiratory: no cough/SOB Gastrointestinal: no N/V/D/C Musculoskeletal: no muscle/joint aches Skin: no rashes Neurological: no tremors/numbness/tingling/dizziness Psychiatric: no depression/anxiety  PE: There were no vitals taken for this visit. Wt Readings from Last 3 Encounters:  01/26/17 196 lb 1.9 oz (89 kg)  01/24/17 191 lb (86.6 kg)  11/07/16 192 lb 1.9 oz (87.1 kg)   Constitutional: overweight, in NAD. No kyphosis. Eyes: PERRLA, EOMI, no exophthalmos ENT: moist mucous membranes, no thyromegaly, no cervical lymphadenopathy Cardiovascular: RRR, No MRG Respiratory: CTA B Gastrointestinal: abdomen soft, NT, ND, BS+ Musculoskeletal: no deformities, strength intact in all 4 Skin: moist, warm, no rashes Neurological: no tremor with outstretched hands, DTR normal in all 4  Assessment: 1. Hypercalcemia/hyperparathyroidism  Plan: Patient has had several instances of elevated calcium, with the highest level being at ***. A corresponding intact PTH level was also high, at ***.  - Patient also  has vitamin D deficiency,  with the last level being ***.  - No apparent complications from hypercalcemia: no h/o nephrolithiasis, no osteoporosis, no fractures. No abdominal pain, depression, bone pain. - I discussed with the patient about the physiology of calcium and parathyroid hormone, and possible side effects from increased PTH, including kidney stones, osteoporosis, abdominal pain, etc.  - We discussed that we need to check whether her hyperparathyroidism is primary (Familial hypercalcemic hypocalciuria or parathyroid adenoma) or secondary (to conditions like: vitamin D deficiency, calcium malabsorption, hypercalciuria,  renal insufficiency, etc.). - I discussed with her that we first need to bring her vitamin D level to normal so we can further investigate the parathyroid status. I explained that in the setting of a low vitamin D, the parathyroid hormone can  be elevated, which is not a pathologic finding. However, if the PTH is elevated in the setting of a normal vitamin D, we will further need to investigate her for primary or secondary hyperparathyroidism. - after we normalize the vitamin D level, we'll need to check: calcium level intact PTH ([facility]) Magnesium Phosphorus vitamin D- 25 HO and 1,25 HO 24h urinary calcium/creatinine ratio - given instructions for urine collection - We discussed possible consequences of hyperparathyroidism: ~1/3 pts will develop complications over 15 years (OP, nephrolithiasis).  - If the tests indicate a parathyroid adenoma, she agrees with a referral to surgery.  - we discussed about criteria for parathyroid surgery:  Increased calcium by more than 1 mg/dL above the upper limit of normal  Kidney ds.  Osteoporosis (or Vb fx) Age <33 years old Newer criteria (2013): High UCa >400 mg/d and increased stone risk by biochemical stone risk analysis Presence of nephrolithiasis or nephrocalcinosis Pt's preference!  - We may need to check a DEXA scan to see if she has osteoporosis (+ add a 33% distal radius for evaluation of cortical bone, which is predominantly affected by hyperparathyroidism).  - I will advise her about vitamin D supplement dose when the results of the vitamin D level are back. - I will see the patient back in 4 months  Philemon Kingdom, MD PhD [facility] Endocrinology

## 2017-09-06 DIAGNOSIS — N281 Cyst of kidney, acquired: Secondary | ICD-10-CM | POA: Diagnosis not present

## 2017-09-06 DIAGNOSIS — R3129 Other microscopic hematuria: Secondary | ICD-10-CM | POA: Diagnosis not present

## 2017-09-06 DIAGNOSIS — R3915 Urgency of urination: Secondary | ICD-10-CM | POA: Diagnosis not present

## 2017-09-06 DIAGNOSIS — Z713 Dietary counseling and surveillance: Secondary | ICD-10-CM | POA: Diagnosis not present

## 2017-09-12 DIAGNOSIS — Z90710 Acquired absence of both cervix and uterus: Secondary | ICD-10-CM | POA: Diagnosis not present

## 2017-09-12 DIAGNOSIS — D3502 Benign neoplasm of left adrenal gland: Secondary | ICD-10-CM | POA: Diagnosis not present

## 2017-09-12 DIAGNOSIS — Z713 Dietary counseling and surveillance: Secondary | ICD-10-CM | POA: Diagnosis not present

## 2017-09-12 DIAGNOSIS — R3915 Urgency of urination: Secondary | ICD-10-CM | POA: Diagnosis not present

## 2017-09-12 DIAGNOSIS — Z9049 Acquired absence of other specified parts of digestive tract: Secondary | ICD-10-CM | POA: Diagnosis not present

## 2017-09-12 DIAGNOSIS — N281 Cyst of kidney, acquired: Secondary | ICD-10-CM | POA: Diagnosis not present

## 2017-09-12 DIAGNOSIS — R3129 Other microscopic hematuria: Secondary | ICD-10-CM | POA: Diagnosis not present

## 2017-10-03 DIAGNOSIS — Z803 Family history of malignant neoplasm of breast: Secondary | ICD-10-CM | POA: Diagnosis not present

## 2017-10-03 DIAGNOSIS — N6489 Other specified disorders of breast: Secondary | ICD-10-CM | POA: Diagnosis not present

## 2017-10-03 DIAGNOSIS — R922 Inconclusive mammogram: Secondary | ICD-10-CM | POA: Diagnosis not present

## 2017-10-11 DIAGNOSIS — M545 Low back pain: Secondary | ICD-10-CM | POA: Diagnosis not present

## 2017-10-11 DIAGNOSIS — M5124 Other intervertebral disc displacement, thoracic region: Secondary | ICD-10-CM | POA: Diagnosis not present

## 2017-10-11 DIAGNOSIS — M546 Pain in thoracic spine: Secondary | ICD-10-CM | POA: Diagnosis not present

## 2017-10-11 DIAGNOSIS — M5126 Other intervertebral disc displacement, lumbar region: Secondary | ICD-10-CM | POA: Diagnosis not present

## 2017-10-16 DIAGNOSIS — E559 Vitamin D deficiency, unspecified: Secondary | ICD-10-CM | POA: Diagnosis not present

## 2017-10-16 DIAGNOSIS — F3170 Bipolar disorder, currently in remission, most recent episode unspecified: Secondary | ICD-10-CM | POA: Diagnosis not present

## 2017-10-16 DIAGNOSIS — M549 Dorsalgia, unspecified: Secondary | ICD-10-CM | POA: Diagnosis not present

## 2017-10-17 ENCOUNTER — Ambulatory Visit (HOSPITAL_COMMUNITY)
Admission: RE | Admit: 2017-10-17 | Discharge: 2017-10-17 | Disposition: A | Discharge status: Home / Self Care | Payer: Medicare Other | Source: Ambulatory Visit | Attending: Urology | Admitting: Urology

## 2017-10-17 DIAGNOSIS — I517 Cardiomegaly: Secondary | ICD-10-CM | POA: Insufficient documentation

## 2017-10-17 DIAGNOSIS — D49511 Neoplasm of unspecified behavior of right kidney: Secondary | ICD-10-CM | POA: Insufficient documentation

## 2017-10-17 DIAGNOSIS — M47816 Spondylosis without myelopathy or radiculopathy, lumbar region: Secondary | ICD-10-CM | POA: Insufficient documentation

## 2017-10-17 DIAGNOSIS — N281 Cyst of kidney, acquired: Secondary | ICD-10-CM | POA: Diagnosis not present

## 2017-10-17 DIAGNOSIS — M5136 Other intervertebral disc degeneration, lumbar region: Secondary | ICD-10-CM | POA: Insufficient documentation

## 2017-10-17 DIAGNOSIS — M47814 Spondylosis without myelopathy or radiculopathy, thoracic region: Secondary | ICD-10-CM | POA: Insufficient documentation

## 2017-10-17 DIAGNOSIS — M5134 Other intervertebral disc degeneration, thoracic region: Secondary | ICD-10-CM | POA: Insufficient documentation

## 2017-10-17 LAB — POCT I-STAT CREATININE: CREATININE: 0.6 mg/dL (ref 0.44–1.00)

## 2017-10-17 MED ORDER — GADOBENATE DIMEGLUMINE 529 MG/ML IV SOLN
20.0000 mL | Freq: Once | INTRAVENOUS | Status: AC | PRN
  Administered 2017-10-17: 18 mL via INTRAVENOUS

## 2017-10-24 DIAGNOSIS — M4727 Other spondylosis with radiculopathy, lumbosacral region: Secondary | ICD-10-CM | POA: Diagnosis not present

## 2017-10-24 DIAGNOSIS — M50123 Cervical disc disorder at C6-C7 level with radiculopathy: Secondary | ICD-10-CM | POA: Diagnosis not present

## 2017-10-26 DIAGNOSIS — M4724 Other spondylosis with radiculopathy, thoracic region: Secondary | ICD-10-CM | POA: Diagnosis not present

## 2017-11-01 ENCOUNTER — Telehealth: Payer: Self-pay | Admitting: Genetic Counselor

## 2017-11-01 NOTE — Telephone Encounter (Signed)
Patient had called to see if someone from our office was trying to get more information for further genetic testing.  I let her know that our office did not call, and as far as I could see from her chart, nobody from [facility] called. She stated that a Female called asking her for information about her genetic testing and wanted to get information from her including her SSN.    I let her know that we would not call and get that information from her, and to please be careful when people like this call.  She stated that if she wanted more genetic testing she would come back to [facility] and get that and she will not provide information to this individual.

## 2017-11-08 DIAGNOSIS — M5124 Other intervertebral disc displacement, thoracic region: Secondary | ICD-10-CM | POA: Diagnosis not present

## 2017-11-10 ENCOUNTER — Encounter (HOSPITAL_COMMUNITY): Payer: Self-pay | Admitting: Oncology

## 2017-12-14 DIAGNOSIS — M545 Low back pain: Secondary | ICD-10-CM | POA: Diagnosis not present

## 2017-12-14 DIAGNOSIS — M546 Pain in thoracic spine: Secondary | ICD-10-CM | POA: Diagnosis not present

## 2017-12-14 DIAGNOSIS — M5124 Other intervertebral disc displacement, thoracic region: Secondary | ICD-10-CM | POA: Diagnosis not present

## 2017-12-14 DIAGNOSIS — M542 Cervicalgia: Secondary | ICD-10-CM | POA: Diagnosis not present

## 2017-12-14 DIAGNOSIS — R262 Difficulty in walking, not elsewhere classified: Secondary | ICD-10-CM | POA: Diagnosis not present

## 2017-12-17 DIAGNOSIS — R262 Difficulty in walking, not elsewhere classified: Secondary | ICD-10-CM | POA: Diagnosis not present

## 2017-12-17 DIAGNOSIS — M542 Cervicalgia: Secondary | ICD-10-CM | POA: Diagnosis not present

## 2017-12-17 DIAGNOSIS — M545 Low back pain: Secondary | ICD-10-CM | POA: Diagnosis not present

## 2017-12-17 DIAGNOSIS — M546 Pain in thoracic spine: Secondary | ICD-10-CM | POA: Diagnosis not present

## 2017-12-17 DIAGNOSIS — M5124 Other intervertebral disc displacement, thoracic region: Secondary | ICD-10-CM | POA: Diagnosis not present

## 2017-12-19 DIAGNOSIS — M546 Pain in thoracic spine: Secondary | ICD-10-CM | POA: Diagnosis not present

## 2017-12-19 DIAGNOSIS — M542 Cervicalgia: Secondary | ICD-10-CM | POA: Diagnosis not present

## 2017-12-19 DIAGNOSIS — M5124 Other intervertebral disc displacement, thoracic region: Secondary | ICD-10-CM | POA: Diagnosis not present

## 2017-12-19 DIAGNOSIS — M545 Low back pain: Secondary | ICD-10-CM | POA: Diagnosis not present

## 2017-12-19 DIAGNOSIS — R262 Difficulty in walking, not elsewhere classified: Secondary | ICD-10-CM | POA: Diagnosis not present

## 2017-12-25 DIAGNOSIS — R3129 Other microscopic hematuria: Secondary | ICD-10-CM | POA: Diagnosis not present

## 2017-12-25 DIAGNOSIS — N281 Cyst of kidney, acquired: Secondary | ICD-10-CM | POA: Diagnosis not present

## 2017-12-26 ENCOUNTER — Encounter: Payer: Self-pay | Admitting: Nurse Practitioner

## 2017-12-26 ENCOUNTER — Ambulatory Visit: Payer: Medicare Other | Admitting: Nurse Practitioner

## 2017-12-26 ENCOUNTER — Ambulatory Visit (INDEPENDENT_AMBULATORY_CARE_PROVIDER_SITE_OTHER): Payer: Medicare Other | Admitting: Nurse Practitioner

## 2017-12-26 VITALS — BP 116/72 | HR 95 | Temp 98.4°F | Resp 16 | Ht 62.0 in | Wt 196.0 lb

## 2017-12-26 DIAGNOSIS — R05 Cough: Secondary | ICD-10-CM

## 2017-12-26 DIAGNOSIS — R059 Cough, unspecified: Secondary | ICD-10-CM

## 2017-12-26 NOTE — Patient Instructions (Addendum)
Please resume flonase nasal spray- you may start with 2 sprays in each nostril daily then reduce to 1 spray in each nostril daily when your symptoms improve. Saline nasal spray or saline irrigation used frequently. For drainage may use Allegra, Claritin or Zyrtec. If you need stronger medicine to stop drainage may take Chlor-Trimeton 2-4 mg every 4 hours. This may cause drowsiness. Drink plenty of fluids and stay well-hydrated.  Please follow up for fevers >101, worsening symptoms, or if your symptoms are no better in 1 week.   Cough, Adult A cough helps to clear your throat and lungs. A cough may last only 2-3 weeks (acute), or it may last longer than 8 weeks (chronic). Many different things can cause a cough. A cough may be a sign of an illness or another medical condition. Follow these instructions at home:  Pay attention to any changes in your cough.  Take medicines only as told by your doctor. ? If you were prescribed an antibiotic medicine, take it as told by your doctor. Do not stop taking it even if you start to feel better. ? Talk with your doctor before you try using a cough medicine.  Drink enough fluid to keep your pee (urine) clear or pale yellow.  If the air is dry, use a cold steam vaporizer or humidifier in your home.  Stay away from things that make you cough at work or at home.  If your cough is worse at night, try using extra pillows to raise your head up higher while you sleep.  Do not smoke, and try not to be around smoke. If you need help quitting, ask your doctor.  Do not have caffeine.  Do not drink alcohol.  Rest as needed. Contact a doctor if:  You have new problems (symptoms).  You cough up yellow fluid (pus).  Your cough does not get better after 2-3 weeks, or your cough gets worse.  Medicine does not help your cough and you are not sleeping well.  You have pain that gets worse or pain that is not helped with medicine.  You have a fever.  You  are losing weight and you do not know why.  You have night sweats. Get help right away if:  You cough up blood.  You have trouble breathing.  Your heartbeat is very fast. This information is not intended to replace advice given to you by your health care provider. Make sure you discuss any questions you have with your health care provider. Document Released: 02/16/2011 Document Revised: 11/11/2015 Document Reviewed: 08/12/2014 Elsevier Interactive Patient Education  [employer].

## 2017-12-26 NOTE — Progress Notes (Signed)
Name: Brittany [name]   MRN: [id number]    DOB: 1955/09/03   Date:12/26/2017       Progress Note  Subjective  Chief Complaint  Chief Complaint  Patient presents with  . Cough    cough, congestion, sore throat    HPI  Brittany [name] is here today for evaluation of acute complaint of cough. She reports she began coughing about three days ago, has noticed some light yellow sputum She reports headache, sinus congestion, sneezing, watery eyes. She denies weakness, fevers, chills, heartburn, chest pain, wheezing, abdominal pain, nausea, vomiting. She has a history of allergic rhinitis and tells me that she has been maintained on many nasal sprays and oral anithistamines in the past for her allergies but is currently not taking any medication for allergies because she does not like the side effects of dryness caused by the medications. She did take an ibuprofen this morning and has felt much better today, and has noticed her cough seems better today as well She overall feels well today    Patient Active Problem List   Diagnosis Date Noted  . Headache 01/26/2017  . Neck pain 11/07/2016  . Dark stools 04/06/2016  . Radon exposure 04/03/2016  . Black tarry stools 04/03/2016  . Allergic rhinitis 02/15/2016  . Left shoulder pain 10/05/2015  . Genetic testing 09/09/2015  . Family history of breast cancer   . Family history of pancreatic cancer   . Acute sinus infection 08/03/2015  . Cough 08/03/2015  . Chronic bilateral low back pain without sciatica 04/20/2015  . Trochanteric bursitis of left hip 04/20/2015  . Cervical myofascial pain syndrome 04/20/2015  . Medicare annual wellness visit, subsequent 02/10/2015  . Gallstones 02/16/2014  . Chest pain 09/15/2013  . Dyspnea 09/15/2013  . Hyperparathyroidism ([place]) 01/29/2013  . Chronic back pain 10/21/2012  . ALOPECIA 03/05/2009  . Obstructive sleep apnea 10/06/2008  . HEMORRHOIDS-INTERNAL 04/28/2008  . COLONIC POLYPS, HYPERPLASTIC, HX OF  04/24/2008  . Hyperlipidemia LDL goal <100 03/20/2007  . DISORDER, BIPOLAR NEC 03/20/2007  . Depression 03/20/2007    Social History   Tobacco Use  . Smoking status: Former Smoker    Packs/day: 0.10    Years: 7.00    Pack years: 0.70    Types: Cigarettes    Last attempt to quit: 06/19/1981    Years since quitting: 36.5  . Smokeless tobacco: Never Used  Substance Use Topics  . Alcohol use: No    Comment: rare     Current Outpatient Medications:  .  azelastine (ASTELIN) 0.1 % nasal spray, USE 2 SPRAYS IN EACH NOSTRIL TWICE A DAY AS DIRECTED, Disp: 30 mL, Rfl: 5 .  buPROPion (WELLBUTRIN XL) 300 MG 24 hr tablet, Take 300 mg by mouth every morning., Disp: , Rfl:  .  Carbamazepine (EQUETRO) 300 MG CP12, Take 600 mg by mouth at bedtime. , Disp: , Rfl:  .  cetirizine (ZYRTEC) 10 MG tablet, Take 1 tablet (10 mg total) by mouth daily., Disp: 90 tablet, Rfl: 1 .  diclofenac sodium (VOLTAREN) 1 % GEL, Apply 2 g topically 4 (four) times daily., Disp: 100 g, Rfl: 1 .  lamoTRIgine (LAMICTAL) 150 MG tablet, Take 300 mg by mouth daily. , Disp: , Rfl:  .  methocarbamol (ROBAXIN) 500 MG tablet, Take 1 tablet (500 mg total) by mouth every 8 (eight) hours as needed for muscle spasms., Disp: 90 tablet, Rfl: 0 .  mometasone (NASONEX) 50 MCG/ACT nasal spray, Place 2 sprays into the nose  daily., Disp: 17 g, Rfl: 5 .  Multiple Vitamin (MULTIVITAMIN WITH MINERALS) TABS, Take 1 tablet by mouth daily., Disp: , Rfl:  .  omeprazole (PRILOSEC) 40 MG capsule, Take 40 mg by mouth daily., Disp: , Rfl:  .  sertraline (ZOLOFT) 50 MG tablet, Take 50 mg by mouth every morning. , Disp: , Rfl:   Allergies  Allergen Reactions  . Flexeril [Cyclobenzaprine]     Unable to leave home when taking as she is incoherent/unsure where she is at times  . Hydrocodone     REACTION: GI upset    ROS  No other specific complaints in a complete review of systems (except as listed in HPI above).  Objective  Vitals:   12/26/17  1524  BP: 116/72  Pulse: 95  Resp: 16  Temp: 98.4 F (36.9 C)  TempSrc: Oral  SpO2: 96%  Weight: 196 lb (88.9 kg)  Height: 5\' 2"  (1.575 m)    Body mass index is 35.85 kg/m.  Nursing Note and Vital Signs reviewed.  Physical Exam  Constitutional: Patient appears well-developed and well-nourished.  No distress.  HEENT: head atraumatic, normocephalic, pupils equal and reactive to light, EOM's intact, TM's without erythema or bulging, no maxillary or frontal sinus tenderness , neck supple without lymphadenopathy, oropharynx pink and moist without exudate Cardiovascular: Normal rate, regular rhythm, S1/S2 present.  Distal pulses intact Pulmonary/Chest: Effort normal and breath sounds clear. No respiratory distress or retractions. Neurological: She is alert and oriented to person, place, and time. No cranial nerve deficit. Coordination, balance, strength, speech and gait are normal.  Skin: Skin is warm and dry. No rash noted. No erythema.  Psychiatric: Patient has a normal mood and affect. behavior is normal. Judgment and thought content normal.   Assessment & Plan  Cough Improvement today per patient Suspect cough could be related to her allergies which we discussed and recommend she resume flonase and oral antihistamine for at least 1 week for allergy symptoms  We also discussed home management of cough, Red flags and when to present for emergency care or RTC including fever >101.52F, chest pain, shortness of breath, new/worsening/un-resolving symptoms during visit. Follow up and care instructions discussed and provided in AVS.

## 2017-12-28 DIAGNOSIS — H25813 Combined forms of age-related cataract, bilateral: Secondary | ICD-10-CM | POA: Diagnosis not present

## 2017-12-28 DIAGNOSIS — H02831 Dermatochalasis of right upper eyelid: Secondary | ICD-10-CM | POA: Diagnosis not present

## 2017-12-28 DIAGNOSIS — H43813 Vitreous degeneration, bilateral: Secondary | ICD-10-CM | POA: Diagnosis not present

## 2018-01-21 DIAGNOSIS — M542 Cervicalgia: Secondary | ICD-10-CM | POA: Diagnosis not present

## 2018-02-05 DIAGNOSIS — L720 Epidermal cyst: Secondary | ICD-10-CM | POA: Diagnosis not present

## 2018-02-05 DIAGNOSIS — L659 Nonscarring hair loss, unspecified: Secondary | ICD-10-CM | POA: Diagnosis not present

## 2018-02-05 DIAGNOSIS — L304 Erythema intertrigo: Secondary | ICD-10-CM | POA: Diagnosis not present

## 2018-02-25 DIAGNOSIS — M542 Cervicalgia: Secondary | ICD-10-CM | POA: Diagnosis not present

## 2018-02-26 DIAGNOSIS — M4696 Unspecified inflammatory spondylopathy, lumbar region: Secondary | ICD-10-CM | POA: Diagnosis not present

## 2018-02-26 DIAGNOSIS — G8929 Other chronic pain: Secondary | ICD-10-CM | POA: Diagnosis not present

## 2018-02-26 DIAGNOSIS — M4306 Spondylolysis, lumbar region: Secondary | ICD-10-CM | POA: Diagnosis not present

## 2018-03-04 DIAGNOSIS — M542 Cervicalgia: Secondary | ICD-10-CM | POA: Diagnosis not present

## 2018-03-11 DIAGNOSIS — M542 Cervicalgia: Secondary | ICD-10-CM | POA: Diagnosis not present

## 2018-03-14 DIAGNOSIS — M542 Cervicalgia: Secondary | ICD-10-CM | POA: Diagnosis not present

## 2018-03-18 ENCOUNTER — Encounter: Payer: Medicare Other | Admitting: Nurse Practitioner

## 2018-03-18 DIAGNOSIS — M542 Cervicalgia: Secondary | ICD-10-CM | POA: Diagnosis not present

## 2018-03-26 DIAGNOSIS — H02423 Myogenic ptosis of bilateral eyelids: Secondary | ICD-10-CM | POA: Diagnosis not present

## 2018-03-26 DIAGNOSIS — H02413 Mechanical ptosis of bilateral eyelids: Secondary | ICD-10-CM | POA: Diagnosis not present

## 2018-03-28 ENCOUNTER — Other Ambulatory Visit: Payer: Self-pay | Admitting: Psychiatry

## 2018-04-01 DIAGNOSIS — L720 Epidermal cyst: Secondary | ICD-10-CM | POA: Diagnosis not present

## 2018-04-01 DIAGNOSIS — M542 Cervicalgia: Secondary | ICD-10-CM | POA: Diagnosis not present

## 2018-04-01 DIAGNOSIS — L649 Androgenic alopecia, unspecified: Secondary | ICD-10-CM | POA: Diagnosis not present

## 2018-04-08 ENCOUNTER — Other Ambulatory Visit: Payer: Self-pay | Admitting: Psychiatry

## 2018-04-15 DIAGNOSIS — M4690 Unspecified inflammatory spondylopathy, site unspecified: Secondary | ICD-10-CM | POA: Diagnosis not present

## 2018-04-15 DIAGNOSIS — M47816 Spondylosis without myelopathy or radiculopathy, lumbar region: Secondary | ICD-10-CM | POA: Diagnosis not present

## 2018-04-15 DIAGNOSIS — M47817 Spondylosis without myelopathy or radiculopathy, lumbosacral region: Secondary | ICD-10-CM | POA: Diagnosis not present

## 2018-04-15 DIAGNOSIS — M545 Low back pain: Secondary | ICD-10-CM | POA: Diagnosis not present

## 2018-05-27 DIAGNOSIS — M4300 Spondylolysis, site unspecified: Secondary | ICD-10-CM | POA: Insufficient documentation

## 2018-05-27 DIAGNOSIS — M4696 Unspecified inflammatory spondylopathy, lumbar region: Secondary | ICD-10-CM | POA: Insufficient documentation

## 2018-05-27 DIAGNOSIS — M4306 Spondylolysis, lumbar region: Secondary | ICD-10-CM | POA: Diagnosis not present

## 2018-06-05 ENCOUNTER — Encounter: Payer: Self-pay | Admitting: Emergency Medicine

## 2018-06-24 ENCOUNTER — Ambulatory Visit (INDEPENDENT_AMBULATORY_CARE_PROVIDER_SITE_OTHER): Payer: Medicare Other | Admitting: Psychiatry

## 2018-06-24 ENCOUNTER — Ambulatory Visit (INDEPENDENT_AMBULATORY_CARE_PROVIDER_SITE_OTHER): Payer: Medicare Other | Admitting: Nurse Practitioner

## 2018-06-24 ENCOUNTER — Encounter: Payer: Self-pay | Admitting: Nurse Practitioner

## 2018-06-24 ENCOUNTER — Encounter: Payer: Self-pay | Admitting: Psychiatry

## 2018-06-24 VITALS — BP 100/60 | HR 71 | Ht 62.0 in | Wt 187.0 lb

## 2018-06-24 DIAGNOSIS — M545 Low back pain, unspecified: Secondary | ICD-10-CM

## 2018-06-24 DIAGNOSIS — G8929 Other chronic pain: Secondary | ICD-10-CM | POA: Diagnosis not present

## 2018-06-24 DIAGNOSIS — F319 Bipolar disorder, unspecified: Secondary | ICD-10-CM | POA: Diagnosis not present

## 2018-06-24 MED ORDER — MELOXICAM 15 MG PO TABS: 15.0000 mg | ORAL_TABLET | Freq: Every day | ORAL | 1 refills | Status: AC

## 2018-06-24 MED ORDER — N-ACETYL-L-CYSTEINE 600 MG PO CAPS: 600.0000 mg | ORAL_CAPSULE | Freq: Every day | ORAL | 3 refills | Status: DC

## 2018-06-24 MED ORDER — DICLOFENAC SODIUM 1 % TD GEL: 2.0000 g | Freq: Four times a day (QID) | TRANSDERMAL | 1 refills | Status: AC

## 2018-06-24 NOTE — Patient Instructions (Signed)
I will plan to see you back in 1 year, or sooner if needed.

## 2018-06-24 NOTE — Progress Notes (Signed)
Brittany [name] is a 63 y.o. female with the following history as recorded in [employer]:  Patient Active Problem List   Diagnosis Date Noted  . Headache 01/26/2017  . Neck pain 11/07/2016  . Dark stools 04/06/2016  . Radon exposure 04/03/2016  . Black tarry stools 04/03/2016  . Allergic rhinitis 02/15/2016  . Left shoulder pain 10/05/2015  . Genetic testing 09/09/2015  . Family history of breast cancer   . Family history of pancreatic cancer   . Acute sinus infection 08/03/2015  . Cough 08/03/2015  . Chronic bilateral low back pain without sciatica 04/20/2015  . Trochanteric bursitis of left hip 04/20/2015  . Cervical myofascial pain syndrome 04/20/2015  . Medicare annual wellness visit, subsequent 02/10/2015  . Gallstones 02/16/2014  . Chest pain 09/15/2013  . Dyspnea 09/15/2013  . Hyperparathyroidism ([place]) 01/29/2013  . Chronic back pain 10/21/2012  . ALOPECIA 03/05/2009  . Obstructive sleep apnea 10/06/2008  . HEMORRHOIDS-INTERNAL 04/28/2008  . COLONIC POLYPS, HYPERPLASTIC, HX OF 04/24/2008  . Hyperlipidemia LDL goal <100 03/20/2007  . DISORDER, BIPOLAR NEC 03/20/2007  . Depression 03/20/2007    Current Outpatient Medications  Medication Sig Dispense Refill  . Acetylcysteine (N-ACETYL-L-CYSTEINE) 600 MG CAPS Take 600 mg by mouth daily. 90 capsule 3  . azelastine (ASTELIN) 0.1 % nasal spray USE 2 SPRAYS IN EACH NOSTRIL TWICE A DAY AS DIRECTED 30 mL 5  . buPROPion (WELLBUTRIN XL) 300 MG 24 hr tablet Take 300 mg by mouth every morning.    . Carbamazepine (EQUETRO) 300 MG CP12 2 capsules daily at 4 pm 180 capsule 0  . cetirizine (ZYRTEC) 10 MG tablet Take 1 tablet (10 mg total) by mouth daily. 90 tablet 1  . diclofenac sodium (VOLTAREN) 1 % GEL Apply 2 g topically 4 (four) times daily. 100 g 1  . lamoTRIgine (LAMICTAL) 150 MG tablet Take 300 mg by mouth daily.     . meloxicam (MOBIC) 15 MG tablet Take 1 tablet (15 mg total) by mouth daily. 30 tablet 1  . mometasone (NASONEX)  50 MCG/ACT nasal spray Place 2 sprays into the nose daily. 17 g 5  . Multiple Vitamin (MULTIVITAMIN WITH MINERALS) TABS Take 1 tablet by mouth daily.    . naltrexone (DEPADE) 50 MG tablet TAKE ONE-HALF (1/2) TABLET TWICE A DAY 90 tablet 0  . omeprazole (PRILOSEC) 40 MG capsule Take 40 mg by mouth daily.    . sertraline (ZOLOFT) 50 MG tablet Take 50 mg by mouth every morning.      No current facility-administered medications for this visit.     Allergies: Flexeril [cyclobenzaprine]; Hydrocodone; Motrin [ibuprofen]; and Robaxin [methocarbamol]  Past Medical History:  Diagnosis Date  . ACNE NEC   . ALLERGIC RHINITIS   . ALOPECIA   . Anal fissure   . Anxiety    Panic Attack.  None in a long time  . Arthritis   . Bowel obstruction ([place])   . Chronic headaches    migraine- none in years  . Constipation    drinks tea with senna  . DEPRESSION   . Depression   . DISORDER, BIPOLAR NEC   . Family history of breast cancer   . Family history of pancreatic cancer   . GERD (gastroesophageal reflux disease)   . HEMORRHOIDS-INTERNAL   . History of stress test    ETT 10/17: Ex 7'; no chest pain, no ST changes, [facility] Treadmill Score 7  . HYPERLIPIDEMIA   . OBSTRUCTIVE SLEEP APNEA   . Seasonal  allergies     Past Surgical History:  Procedure Laterality Date  . BREAST LUMPECTOMY Right last done 2005   x 2  . CHOLECYSTECTOMY N/A 03/07/2014   Procedure: LAPAROSCOPIC CHOLECYSTECTOMY WITH INTRAOPERATIVE CHOLANGIOGRAM;  Surgeon: Fanny Skates, MD;  Location: [facility];  Service: General;  Laterality: N/A;  . EUS N/A 01/27/2014   Procedure: ESOPHAGEAL ENDOSCOPIC ULTRASOUND (EUS) RADIAL;  Surgeon: Arta Silence, MD;  Location: [facility];  Service: Endoscopy;  Laterality: N/A;  . HAND SURGERY Bilateral    for arthritis  left 02/02/14- right approx 2014  . KNEE SURGERY Bilateral    right x 2 left x 1.  arthroscopy  . RADICAL HYSTERECTOMY    . thumb and pinkie finger surgery Right 2014  . TONSILLECTOMY       Family History  Problem Relation Age of Onset  . Other Paternal Aunt        x 2, blood clotting disorder  . Breast cancer Paternal Aunt 50  . Heart disease Sister   . Kidney disease Maternal Uncle   . Esophageal cancer Paternal Uncle   . Pancreatic cancer Paternal Uncle   . COPD Father   . Breast cancer Mother 51  . Breast cancer Maternal Grandmother 68  . Stroke Maternal Grandfather   . Aneurysm Paternal Grandmother        brain  . Hypertension Brother   . Breast cancer Maternal Aunt        dx in her 1s  . Breast cancer Maternal Aunt        dx in her 42s  . Cancer Maternal Uncle        NOS  . Cancer Maternal Uncle        tumor on his head  . Breast cancer Paternal Aunt        dx in her 15s  . Breast cancer Paternal Aunt        dx in her 26s  . Lung cancer Paternal Uncle   . Breast cancer Cousin 92       paternal first cousin  . Colon cancer Maternal Uncle   . Colon polyps Maternal Uncle   . Irritable bowel syndrome Cousin   . Rectal cancer Neg Hx   . Stomach cancer Neg Hx     Social History   Tobacco Use  . Smoking status: Former Smoker    Packs/day: 0.10    Years: 7.00    Pack years: 0.70    Types: Cigarettes    Last attempt to quit: 06/19/1981    Years since quitting: 37.0  . Smokeless tobacco: Never Used  Substance Use Topics  . Alcohol use: No    Comment: rare     Subjective:  Brittany [name] is here today to establish care as transfer from prior provider in the same clinic. Aside from primary care, she is routinely followed by [facility] for routine primary care, orthopedics for osteoarthritis, bursitis; pulmonology for allergic rhinitis, OSA; BH for bipolar I disorder; dermatology for skin checks. She is not currently maintained on any daily medications by prior PCP in this practice, she tells me that all of her daily medications come from [place] [facility], and that she recently had full set of annual labs there including  blood counts, liver and kidney function,  cholesterol, diabetes testing, and was told all the labs were WNL. She gets mammograms [facility] [place] where she lives and tells me these are up to date. She is requesting  a refill on her topical diclofenac and oral mobic today, both of which she uses as needed for back pain. She reports chronic lower back pain, which she is followed by neurosurgeon, PT, and pain clinic in [place] for. She says she otherwise feels well today, no complaints, no fevers, chills, weakness, syncope, cp, sob, abd pain, n/v.  No LMP recorded. Patient has had a hysterectomy.   ROS- See HPI  Objective:  Vitals:   06/24/18 1439  BP: 100/60  Pulse: 71  SpO2: 94%  Weight: 187 lb (84.8 kg)  Height: 5\' 2"  (1.575 m)    General: Well developed, well nourished, in no acute distress  Skin : Warm and dry.  Head: Normocephalic and atraumatic  Eyes: Sclera and conjunctiva clear; pupils round and reactive to light; extraocular movements intact   Oropharynx: Pink, supple. No suspicious lesions  Neck: Supple Lungs: Respirations unlabored; clear to auscultation bilaterally without wheeze, rales, rhonchi  CVS exam: normal rate and regular rhythm, S1 and S2 normal.  Musculoskeletal: No deformities; no active joint inflammation  Extremities: No edema, cyanosis, clubbing  Vessels: Symmetric bilaterally  Neurologic: Alert and oriented; speech intact; face symmetrical; moves all extremities well; CNII-XII intact without focal deficit  Psychiatric: Normal mood and affect.  Assessment:  1. Chronic bilateral low back pain, unspecified whether sciatica present     Plan:  We will send request for her medical records including recent labs, imaging  Return in about 1 year (around 06/25/2019).  No orders of the defined types were placed in this encounter.   Requested Prescriptions   Signed Prescriptions Disp Refills  . diclofenac sodium (VOLTAREN) 1 % GEL 100 g 1    Sig: Apply 2 g topically 4 (four) times  daily.  . meloxicam (MOBIC) 15 MG tablet 30 tablet 1    Sig: Take 1 tablet (15 mg total) by mouth daily.

## 2018-06-24 NOTE — Progress Notes (Addendum)
Brittany [name] [id number] 03/09/1956 63 y.o.  Subjective:   Patient ID:  Brittany [name] is a 63 y.o. (DOB Jun 19, 1956) female.  Chief Complaint:  Chief Complaint  Patient presents with  . Follow-up    Medication management    HPI Nabilah G Cortner presents to the office today for follow-up of bipolar disorder.    [place] working on self care with walking and watching caffeine and doesn't want to take more meds.  Still living in [place].  Upset over politics.    Occ periods of racing thoughts and periods of irrritability or periods of isolation.  Overall though doing well. Patient reports stable mood and denies depressed or irritable moods.  Patient denies any recent difficulty with anxiety.  Patient denies difficulty with sleep initiation or maintenance. Denies appetite disturbance.  Patient reports that energy and motivation have been good.  Patient denies any difficulty with concentration.  Patient denies any suicidal ideation.  Protects sleeep 8-4AM.  Active at church and that helps.  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  No partying.  Holding into longterm friendships.  Doesn't go to store at [employer].  H health not as good.  He still goes out alone and does things.  Still in dialysis.    Review of Systems:  Review of Systems  Musculoskeletal: Positive for back pain.  Neurological: Negative for tremors and weakness.  Hematological: Negative for adenopathy. Does not bruise/bleed easily.  Psychiatric/Behavioral: Negative for agitation, behavioral problems, confusion, decreased concentration, dysphoric mood, hallucinations, self-injury, sleep disturbance and suicidal ideas. The patient is not nervous/anxious and is not hyperactive.     Medications: I have reviewed the patient's current medications.  Current Outpatient Medications  Medication Sig Dispense Refill  . azelastine (ASTELIN) 0.1 % nasal spray USE 2 SPRAYS IN EACH NOSTRIL TWICE A DAY AS DIRECTED 30 mL 5  . buPROPion  (WELLBUTRIN XL) 300 MG 24 hr tablet Take 300 mg by mouth every morning.    . Carbamazepine (EQUETRO) 300 MG CP12 2 capsules daily at 4 pm 180 capsule 0  . cetirizine (ZYRTEC) 10 MG tablet Take 1 tablet (10 mg total) by mouth daily. 90 tablet 1  . diclofenac sodium (VOLTAREN) 1 % GEL Apply 2 g topically 4 (four) times daily. 100 g 1  . lamoTRIgine (LAMICTAL) 150 MG tablet Take 300 mg by mouth daily.     . meloxicam (MOBIC) 15 MG tablet Take 15 mg by mouth daily.    . mometasone (NASONEX) 50 MCG/ACT nasal spray Place 2 sprays into the nose daily. 17 g 5  . Multiple Vitamin (MULTIVITAMIN WITH MINERALS) TABS Take 1 tablet by mouth daily.    Marland Kitchen omeprazole (PRILOSEC) 40 MG capsule Take 40 mg by mouth daily.    . sertraline (ZOLOFT) 50 MG tablet Take 50 mg by mouth every morning.     . Acetylcysteine (N-ACETYL-L-CYSTEINE) 600 MG CAPS Take 600 mg by mouth daily. 90 capsule 3  . methocarbamol (ROBAXIN) 500 MG tablet Take 1 tablet (500 mg total) by mouth every 8 (eight) hours as needed for muscle spasms. (Patient not taking: Reported on 06/24/2018) 90 tablet 0  . naltrexone (DEPADE) 50 MG tablet TAKE ONE-HALF (1/2) TABLET TWICE A DAY (Patient not taking: Reported on 06/24/2018) 90 tablet 0   No current facility-administered medications for this visit.     Medication Side Effects: None  Allergies:  Allergies  Allergen Reactions  . Flexeril [Cyclobenzaprine]     Unable to leave home when taking  as she is incoherent/unsure where she is at times  . Hydrocodone     REACTION: GI upset  . Motrin [Ibuprofen]   . Robaxin [Methocarbamol]     Impairs judgement    Past Medical History:  Diagnosis Date  . ACNE NEC   . ALLERGIC RHINITIS   . ALOPECIA   . Anal fissure   . Anxiety    Panic Attack.  None in a long time  . Arthritis   . Bowel obstruction ([place])   . Chronic headaches    migraine- none in years  . Constipation    drinks tea with senna  . DEPRESSION   . Depression   . DISORDER, BIPOLAR NEC    . Family history of breast cancer   . Family history of pancreatic cancer   . GERD (gastroesophageal reflux disease)   . HEMORRHOIDS-INTERNAL   . History of stress test    ETT 10/17: Ex 7'; no chest pain, no ST changes, [facility] Treadmill Score 7  . HYPERLIPIDEMIA   . OBSTRUCTIVE SLEEP APNEA   . Seasonal allergies     Family History  Problem Relation Age of Onset  . Other Paternal Aunt        x 2, blood clotting disorder  . Breast cancer Paternal Aunt 80  . Heart disease Sister   . Kidney disease Maternal Uncle   . Esophageal cancer Paternal Uncle   . Pancreatic cancer Paternal Uncle   . COPD Father   . Breast cancer Mother 77  . Breast cancer Maternal Grandmother 18  . Stroke Maternal Grandfather   . Aneurysm Paternal Grandmother        brain  . Hypertension Brother   . Breast cancer Maternal Aunt        dx in her 78s  . Breast cancer Maternal Aunt        dx in her 23s  . Cancer Maternal Uncle        NOS  . Cancer Maternal Uncle        tumor on his head  . Breast cancer Paternal Aunt        dx in her 89s  . Breast cancer Paternal Aunt        dx in her 24s  . Lung cancer Paternal Uncle   . Breast cancer Cousin 71       paternal first cousin  . Colon cancer Maternal Uncle   . Colon polyps Maternal Uncle   . Irritable bowel syndrome Cousin   . Rectal cancer Neg Hx   . Stomach cancer Neg Hx     Social History   Socioeconomic History  . Marital status: Married    Spouse name: Not on file  . Number of children: 4  . Years of education: Not on file  . Highest education level: Not on file  Occupational History  . Occupation: disabled    Fish farm manager: UNEMPLOYED  Social Needs  . Financial resource strain: Not on file  . Food insecurity:    Worry: Not on file    Inability: Not on file  . Transportation needs:    Medical: Not on file    Non-medical: Not on file  Tobacco Use  . Smoking status: Former Smoker    Packs/day: 0.10    Years: 7.00    Pack years: 0.70     Types: Cigarettes    Last attempt to quit: 06/19/1981    Years since quitting: 37.0  . Smokeless tobacco: Never Used  Substance and Sexual Activity  . Alcohol use: No    Comment: rare  . Drug use: No  . Sexual activity: Not on file  Lifestyle  . Physical activity:    Days per week: Not on file    Minutes per session: Not on file  . Stress: Not on file  Relationships  . Social connections:    Talks on phone: Not on file    Gets together: Not on file    Attends religious service: Not on file    Active member of club or organization: Not on file    Attends meetings of clubs or organizations: Not on file    Relationship status: Not on file  . Intimate partner violence:    Fear of current or ex partner: Not on file    Emotionally abused: Not on file    Physically abused: Not on file    Forced sexual activity: Not on file  Other Topics Concern  . Not on file  Social History Narrative  . Not on file    Past Medical History, Surgical history, Social history, and Family history were reviewed and updated as appropriate.   Please see review of systems for further details on the patient's review from today.   Objective:   Physical Exam:  There were no vitals taken for this visit.  Physical Exam Constitutional:      General: She is not in acute distress.    Appearance: Normal appearance. She is well-developed. She is obese.  Musculoskeletal:        General: No deformity.  Neurological:     Mental Status: She is alert and oriented to person, place, and time.     Motor: No tremor.     Coordination: Coordination normal.     Gait: Gait normal.  Psychiatric:        Attention and Perception: Attention and perception normal.        Mood and Affect: Mood is not anxious or depressed. Affect is not labile, blunt, angry or inappropriate.        Speech: Speech normal.        Behavior: Behavior normal.        Thought Content: Thought content normal. Thought content does not include  homicidal or suicidal ideation. Thought content does not include homicidal or suicidal plan.        Cognition and Memory: Cognition normal.        Judgment: Judgment normal.     Comments: Insight intact.  Occ irritable. No auditory or visual hallucinations. No delusions.      Lab Review:     Component Value Date/Time   NA 140 01/26/2017 1030   K 4.1 01/26/2017 1030   CL 108 01/26/2017 1030   CO2 28 01/26/2017 1030   GLUCOSE 94 01/26/2017 1030   BUN 7 01/26/2017 1030   CREATININE 0.60 10/17/2017 0727   CALCIUM 10.2 01/26/2017 1030   CALCIUM 10.0 01/26/2017 1030   PROT 7.3 01/26/2017 1030   ALBUMIN 4.4 01/26/2017 1030   AST 26 01/26/2017 1030   ALT 52 (H) 01/26/2017 1030   ALKPHOS 117 01/26/2017 1030   BILITOT 0.3 01/26/2017 1030   GFRNONAA >90 03/07/2014 0749   GFRAA >90 03/07/2014 0749       Component Value Date/Time   WBC 6.6 04/03/2016 1201   RBC 4.44 04/03/2016 1201   HGB 13.6 04/03/2016 1201   HCT 40.5 04/03/2016 1201   PLT 322.0 04/03/2016 1201   MCV 91.2  04/03/2016 1201   MCH 30.7 03/07/2014 0749   MCHC 33.7 04/03/2016 1201   RDW 13.3 04/03/2016 1201   LYMPHSABS 2.5 04/03/2016 1201   MONOABS 0.5 04/03/2016 1201   EOSABS 0.2 04/03/2016 1201   BASOSABS 0.0 04/03/2016 1201    Lithium Lvl  Date Value Ref Range Status  06/05/2013 0.59 (L) 0.80 - 1.40 mEq/L Final     No results found for: PHENYTOIN, PHENOBARB, VALPROATE, CBMZ   .res Assessment: Plan:    Bipolar I disorder with seasonal pattern ([place]) Mild cognitive complaints could possibly be medication side effects or unrelated.   Lives in [place] but has friends here and doesn't want to change psychiatrists.  Mrs. Wunder has some mild mood cycling which is chronic.  She manages to function well physically and socially.  She is somewhat anxious in certain settings around people she does not know.  Disc low dose mood stabilizer and option to raise the dosage to help with mild cycling.  She doesn't want to  change out of fear of SE. the sertraline has seemed to help with her irritability though she understands there is some risk of mood cycling from that medication.  At this time she feels like the benefits exceed the side effects of the current regimen.  N-acetylcysteine 600 mg off label for cognitive complaints.  Cont meds.  This appt was 30 mins.  FU 6 mos  Lynder Parents, MD, DFAPA   Please see After Visit Summary for patient specific instructions.  Future Appointments  Date Time Provider [place]  06/24/2018  3:00 PM Lance Sell, NP [id number]  06/25/2018  9:00 AM Dillingham, Loel Lofty, DO [facility] None    No orders of the defined types were placed in this encounter.     -------------------------------

## 2018-06-24 NOTE — Assessment & Plan Note (Signed)
Discussed risks of long term NSAID use including CV, GI, she would like to continue medications prn She will continue planned follow up with specialists or follow up sooner for new, worsening symptoms

## 2018-06-25 ENCOUNTER — Institutional Professional Consult (permissible substitution): Payer: Medicare Other | Admitting: Plastic Surgery

## 2018-06-25 DIAGNOSIS — R3129 Other microscopic hematuria: Secondary | ICD-10-CM | POA: Diagnosis not present

## 2018-06-26 ENCOUNTER — Other Ambulatory Visit: Payer: Self-pay | Admitting: Psychiatry

## 2018-06-28 ENCOUNTER — Telehealth: Payer: Self-pay | Admitting: Nurse Practitioner

## 2018-06-28 NOTE — Telephone Encounter (Signed)
ROI fax to [facility] for patient records

## 2018-07-24 ENCOUNTER — Other Ambulatory Visit: Payer: Self-pay | Admitting: Internal Medicine

## 2018-07-26 ENCOUNTER — Telehealth: Payer: Self-pay | Admitting: Psychiatry

## 2018-07-26 NOTE — Telephone Encounter (Signed)
Asking for adderall 30mg , didn't see it documented it was discussed.  Also requesting a 90 day be sent to [facility] explained since this was the first prescription it may not be beneficial in case it's not correct dose. Said then send to [facility] on [address] [place]

## 2018-07-26 NOTE — Telephone Encounter (Signed)
Left voicemail to call back with a pharmacy

## 2018-07-26 NOTE — Telephone Encounter (Signed)
Pt left v-mail. Stated she had talked with Dr. Clovis Pu about taking Adderall. She would like to get a Rx 30mg  sent to Pharm.

## 2018-07-26 NOTE — Telephone Encounter (Signed)
No rush on this return call.  I realize she has discussed concerns about her focus with me, but I have never prescribed a stimulant to her.  Stimulants are controlled substances and have a lot of side effect risks.  Stimulants are also risky in people who have any history of mood swings.  I think this is much too risky for me to prescribe to her at this time.  We could discuss alternatives at an appointment but I could never prescribe a controlled substance like this over the phone.  Lynder Parents, MD, DFAPA

## 2018-07-29 NOTE — Telephone Encounter (Signed)
Informed pt of information and she would like to schedule office visit with provider to discuss

## 2018-08-19 DIAGNOSIS — M7062 Trochanteric bursitis, left hip: Secondary | ICD-10-CM | POA: Diagnosis not present

## 2018-08-19 DIAGNOSIS — M25552 Pain in left hip: Secondary | ICD-10-CM | POA: Diagnosis not present

## 2018-08-19 DIAGNOSIS — M7602 Gluteal tendinitis, left hip: Secondary | ICD-10-CM | POA: Diagnosis not present

## 2018-08-19 DIAGNOSIS — M7632 Iliotibial band syndrome, left leg: Secondary | ICD-10-CM | POA: Diagnosis not present

## 2018-08-20 DIAGNOSIS — Z79899 Other long term (current) drug therapy: Secondary | ICD-10-CM | POA: Diagnosis not present

## 2018-08-20 DIAGNOSIS — G8929 Other chronic pain: Secondary | ICD-10-CM | POA: Diagnosis not present

## 2018-08-20 DIAGNOSIS — M545 Low back pain: Secondary | ICD-10-CM | POA: Diagnosis not present

## 2018-08-20 DIAGNOSIS — M25552 Pain in left hip: Secondary | ICD-10-CM | POA: Diagnosis not present

## 2018-08-29 ENCOUNTER — Other Ambulatory Visit: Payer: Self-pay | Admitting: Psychiatry

## 2018-09-02 DIAGNOSIS — M7062 Trochanteric bursitis, left hip: Secondary | ICD-10-CM | POA: Diagnosis not present

## 2018-09-02 DIAGNOSIS — M25552 Pain in left hip: Secondary | ICD-10-CM | POA: Diagnosis not present

## 2018-09-02 DIAGNOSIS — M7602 Gluteal tendinitis, left hip: Secondary | ICD-10-CM | POA: Diagnosis not present

## 2018-09-02 DIAGNOSIS — M7632 Iliotibial band syndrome, left leg: Secondary | ICD-10-CM | POA: Diagnosis not present

## 2018-09-04 DIAGNOSIS — M7062 Trochanteric bursitis, left hip: Secondary | ICD-10-CM | POA: Diagnosis not present

## 2018-09-04 DIAGNOSIS — M25552 Pain in left hip: Secondary | ICD-10-CM | POA: Diagnosis not present

## 2018-09-04 DIAGNOSIS — M7632 Iliotibial band syndrome, left leg: Secondary | ICD-10-CM | POA: Diagnosis not present

## 2018-09-04 DIAGNOSIS — M7602 Gluteal tendinitis, left hip: Secondary | ICD-10-CM | POA: Diagnosis not present

## 2018-09-09 DIAGNOSIS — M7062 Trochanteric bursitis, left hip: Secondary | ICD-10-CM | POA: Diagnosis not present

## 2018-09-09 DIAGNOSIS — M7602 Gluteal tendinitis, left hip: Secondary | ICD-10-CM | POA: Diagnosis not present

## 2018-09-09 DIAGNOSIS — M7632 Iliotibial band syndrome, left leg: Secondary | ICD-10-CM | POA: Diagnosis not present

## 2018-09-09 DIAGNOSIS — M25552 Pain in left hip: Secondary | ICD-10-CM | POA: Diagnosis not present

## 2018-09-11 DIAGNOSIS — M7632 Iliotibial band syndrome, left leg: Secondary | ICD-10-CM | POA: Diagnosis not present

## 2018-09-11 DIAGNOSIS — M7602 Gluteal tendinitis, left hip: Secondary | ICD-10-CM | POA: Diagnosis not present

## 2018-09-11 DIAGNOSIS — M7062 Trochanteric bursitis, left hip: Secondary | ICD-10-CM | POA: Diagnosis not present

## 2018-09-11 DIAGNOSIS — M25552 Pain in left hip: Secondary | ICD-10-CM | POA: Diagnosis not present

## 2018-09-16 DIAGNOSIS — M7602 Gluteal tendinitis, left hip: Secondary | ICD-10-CM | POA: Diagnosis not present

## 2018-09-16 DIAGNOSIS — M25552 Pain in left hip: Secondary | ICD-10-CM | POA: Diagnosis not present

## 2018-09-16 DIAGNOSIS — M7062 Trochanteric bursitis, left hip: Secondary | ICD-10-CM | POA: Diagnosis not present

## 2018-09-16 DIAGNOSIS — M7632 Iliotibial band syndrome, left leg: Secondary | ICD-10-CM | POA: Diagnosis not present

## 2018-09-18 DIAGNOSIS — M7062 Trochanteric bursitis, left hip: Secondary | ICD-10-CM | POA: Diagnosis not present

## 2018-09-18 DIAGNOSIS — M7632 Iliotibial band syndrome, left leg: Secondary | ICD-10-CM | POA: Diagnosis not present

## 2018-09-18 DIAGNOSIS — M7602 Gluteal tendinitis, left hip: Secondary | ICD-10-CM | POA: Diagnosis not present

## 2018-09-18 DIAGNOSIS — M25552 Pain in left hip: Secondary | ICD-10-CM | POA: Diagnosis not present

## 2018-09-23 ENCOUNTER — Ambulatory Visit: Payer: Medicare Other | Admitting: Psychiatry

## 2018-09-30 DIAGNOSIS — M7602 Gluteal tendinitis, left hip: Secondary | ICD-10-CM | POA: Diagnosis not present

## 2018-09-30 DIAGNOSIS — M7632 Iliotibial band syndrome, left leg: Secondary | ICD-10-CM | POA: Diagnosis not present

## 2018-09-30 DIAGNOSIS — M25552 Pain in left hip: Secondary | ICD-10-CM | POA: Diagnosis not present

## 2018-09-30 DIAGNOSIS — M7062 Trochanteric bursitis, left hip: Secondary | ICD-10-CM | POA: Diagnosis not present

## 2018-10-07 DIAGNOSIS — M7602 Gluteal tendinitis, left hip: Secondary | ICD-10-CM | POA: Diagnosis not present

## 2018-10-07 DIAGNOSIS — M7632 Iliotibial band syndrome, left leg: Secondary | ICD-10-CM | POA: Diagnosis not present

## 2018-10-07 DIAGNOSIS — M7062 Trochanteric bursitis, left hip: Secondary | ICD-10-CM | POA: Diagnosis not present

## 2018-10-07 DIAGNOSIS — M25552 Pain in left hip: Secondary | ICD-10-CM | POA: Diagnosis not present

## 2018-10-09 DIAGNOSIS — M7602 Gluteal tendinitis, left hip: Secondary | ICD-10-CM | POA: Diagnosis not present

## 2018-10-09 DIAGNOSIS — M7632 Iliotibial band syndrome, left leg: Secondary | ICD-10-CM | POA: Diagnosis not present

## 2018-10-09 DIAGNOSIS — M7062 Trochanteric bursitis, left hip: Secondary | ICD-10-CM | POA: Diagnosis not present

## 2018-10-09 DIAGNOSIS — M25552 Pain in left hip: Secondary | ICD-10-CM | POA: Diagnosis not present

## 2018-10-14 DIAGNOSIS — M545 Low back pain: Secondary | ICD-10-CM | POA: Diagnosis not present

## 2018-10-14 DIAGNOSIS — M9985 Other biomechanical lesions of pelvic region: Secondary | ICD-10-CM | POA: Diagnosis not present

## 2018-10-14 DIAGNOSIS — M6281 Muscle weakness (generalized): Secondary | ICD-10-CM | POA: Diagnosis not present

## 2018-10-14 DIAGNOSIS — M4055 Lordosis, unspecified, thoracolumbar region: Secondary | ICD-10-CM | POA: Diagnosis not present

## 2018-10-21 ENCOUNTER — Telehealth: Payer: Self-pay | Admitting: Psychiatry

## 2018-10-21 ENCOUNTER — Other Ambulatory Visit: Payer: Self-pay

## 2018-10-21 DIAGNOSIS — M545 Low back pain: Secondary | ICD-10-CM | POA: Diagnosis not present

## 2018-10-21 MED ORDER — LAMOTRIGINE 150 MG PO TABS: 300.0000 mg | ORAL_TABLET | Freq: Every day | ORAL | 0 refills | Status: DC

## 2018-10-21 MED ORDER — LAMOTRIGINE 150 MG PO TABS: 300.0000 mg | ORAL_TABLET | Freq: Every day | ORAL | 1 refills | Status: DC

## 2018-10-21 NOTE — Telephone Encounter (Signed)
Patient called and said that she needs a refill of her lamictal 300 mg [facility] a to the [facility] in [phone]. She only has two pills left so a local will be need to be called in. She said that she will pay for it to [facility] exped[facility]. She has an appointment on Friday 5/8

## 2018-10-21 NOTE — Telephone Encounter (Signed)
rx's submitted for mail order and her local [place]

## 2018-10-23 DIAGNOSIS — M4055 Lordosis, unspecified, thoracolumbar region: Secondary | ICD-10-CM | POA: Diagnosis not present

## 2018-10-23 DIAGNOSIS — M9985 Other biomechanical lesions of pelvic region: Secondary | ICD-10-CM | POA: Diagnosis not present

## 2018-10-23 DIAGNOSIS — M545 Low back pain: Secondary | ICD-10-CM | POA: Diagnosis not present

## 2018-10-23 DIAGNOSIS — M6281 Muscle weakness (generalized): Secondary | ICD-10-CM | POA: Diagnosis not present

## 2018-10-25 ENCOUNTER — Other Ambulatory Visit: Payer: Self-pay

## 2018-10-25 ENCOUNTER — Encounter: Payer: Self-pay | Admitting: Psychiatry

## 2018-10-25 ENCOUNTER — Ambulatory Visit (INDEPENDENT_AMBULATORY_CARE_PROVIDER_SITE_OTHER): Payer: Medicare Other | Admitting: Psychiatry

## 2018-10-25 DIAGNOSIS — F319 Bipolar disorder, unspecified: Secondary | ICD-10-CM | POA: Diagnosis not present

## 2018-10-25 DIAGNOSIS — F411 Generalized anxiety disorder: Secondary | ICD-10-CM | POA: Diagnosis not present

## 2018-10-25 MED ORDER — BUPROPION HCL ER (XL) 450 MG PO TB24: 450.0000 mg | ORAL_TABLET | Freq: Every morning | ORAL | 0 refills | Status: DC

## 2018-10-25 NOTE — Progress Notes (Signed)
Brittany [name] [id number] 16-Jan-1956 63 y.o.   Virtual Visit via Telephone Note  I connected with pt by telephone and verified that I am speaking with the correct person using two identifiers.   I discussed the limitations, risks, security and privacy concerns of performing an evaluation and management service by telephone and the availability of in person appointments. I also discussed with the patient that there may be a patient responsible charge related to this service. The patient expressed understanding and agreed to proceed.  I discussed the assessment and treatment plan with the patient. The patient was provided an opportunity to ask questions and all were answered. The patient agreed with the plan and demonstrated an understanding of the instructions.   The patient was advised to call back or seek an in-person evaluation if the symptoms worsen or if the condition fails to improve as anticipated.  I provided 23 minutes of non-face-to-face time during this encounter. The call started at 1000 and ended at 1023. The patient was located at home and the provider was located office.   Subjective:   Patient ID:  Brittany [name] is a 63 y.o. (DOB [birth year]) female.  Chief Complaint:  Chief Complaint  Patient presents with  . Follow-up    Medication Management  . Other    Trouble concentrating    HPI Brittany [name] presents to the office today for follow-up of bipolar disorder.    Last seen in November.  No meds were changed at that time.   Has back pain that interferes with normal function.  House is a mess bc can't do much.  Hurt her back in there [employer]  NAC has helped after a couple of months.  Also feels more mellow and nice.  Still problems with concentration.  Asks about meds to help with attention span. Asks about Adderall.  Intermittent problems with forgetfulness and other days she drops the ball.  [place] working on self care with walking and watching caffeine and  doesn't want to take more meds.  Still living in [place].  Upset over politics.    Occ periods of racing thoughts and periods of irrritability or periods of isolation.  Overall though doing well. Patient reports stable mood and denies depressed or irritable moods.  Patient denies any recent difficulty with anxiety.  Patient denies difficulty with sleep initiation or maintenance. Denies appetite disturbance.  Patient reports that energy and motivation have been good.  Patient denies any difficulty with concentration.  Patient denies any suicidal ideation.  Protects sleeep 8-4AM.  Active at church and that helps.  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  No partying.  Holding into longterm friendships.  Doesn't go to store at [employer].  H health not as good.  He still goes out alone and does things.  Still in dialysis.  Past Psychiatric Medication Trials: Equetro, naltrexone, sertraline 50, hydroxyzine SE jerks, wellbutrin 300, lamotrigine 300, lithium, Geodon, doxepin   Review of Systems:  Review of Systems  Musculoskeletal: Positive for back pain.  Neurological: Negative for tremors and weakness.  Hematological: Negative for adenopathy. Does not bruise/bleed easily.  Psychiatric/Behavioral: Negative for agitation, behavioral problems, confusion, decreased concentration, dysphoric mood, hallucinations, self-injury, sleep disturbance and suicidal ideas. The patient is not nervous/anxious and is not hyperactive.    Still in physical therapy.  Getting nerve block for her back.  Medications: I have reviewed the patient's current medications.  Current Outpatient Medications  Medication Sig Dispense Refill  . Acetylcysteine (N-ACETYL-L-CYSTEINE)  600 MG CAPS Take 600 mg by mouth daily. 90 capsule 3  . buPROPion 450 MG TB24 Take 450 mg by mouth every morning. 90 tablet 0  . Carbamazepine (EQUETRO) 300 MG CP12 TAKE 2 CAPSULES DAILY AT 4 P.M. 180 capsule 0  . diclofenac sodium (VOLTAREN) 1 % GEL Apply 2  g topically 4 (four) times daily. 100 g 1  . lamoTRIgine (LAMICTAL) 150 MG tablet Take 2 tablets (300 mg total) by mouth daily. 60 tablet 0  . meloxicam (MOBIC) 15 MG tablet Take 1 tablet (15 mg total) by mouth daily. 30 tablet 1  . Multiple Vitamin (MULTIVITAMIN WITH MINERALS) TABS Take 1 tablet by mouth daily.    Marland Kitchen omeprazole (PRILOSEC) 40 MG capsule Take 40 mg by mouth daily.    . sertraline (ZOLOFT) 50 MG tablet Take 50 mg by mouth every morning.     Marland Kitchen tiZANidine (ZANAFLEX) 2 MG tablet     . Vitamin Mixture (ESTER-C PO) Take 1,000 mg by mouth.     No current facility-administered medications for this visit.     Medication Side Effects: None  Allergies:  Allergies  Allergen Reactions  . Flexeril [Cyclobenzaprine]     Unable to leave home when taking as she is incoherent/unsure where she is at times  . Hydrocodone     REACTION: GI upset  . Motrin [Ibuprofen]   . Robaxin [Methocarbamol]     Impairs judgement    Past Medical History:  Diagnosis Date  . ACNE NEC   . ALLERGIC RHINITIS   . ALOPECIA   . Anal fissure   . Anxiety    Panic Attack.  None in a long time  . Arthritis   . Bowel obstruction ([place])   . Chronic headaches    migraine- none in years  . Constipation    drinks tea with senna  . DEPRESSION   . Depression   . DISORDER, BIPOLAR NEC   . Family history of breast cancer   . Family history of pancreatic cancer   . GERD (gastroesophageal reflux disease)   . HEMORRHOIDS-INTERNAL   . History of stress test    ETT 10/17: Ex 7'; no chest pain, no ST changes, [facility] Treadmill Score 7  . HYPERLIPIDEMIA   . OBSTRUCTIVE SLEEP APNEA   . Seasonal allergies     Family History  Problem Relation Age of Onset  . Other Paternal Aunt        x 2, blood clotting disorder  . Breast cancer Paternal Aunt 91  . Heart disease Sister   . Kidney disease Maternal Uncle   . Esophageal cancer Paternal Uncle   . Pancreatic cancer Paternal Uncle   . COPD Father   . Breast  cancer Mother 30  . Breast cancer Maternal Grandmother 79  . Stroke Maternal Grandfather   . Aneurysm Paternal Grandmother        brain  . Hypertension Brother   . Breast cancer Maternal Aunt        dx in her 74s  . Breast cancer Maternal Aunt        dx in her 55s  . Cancer Maternal Uncle        NOS  . Cancer Maternal Uncle        tumor on his head  . Breast cancer Paternal Aunt        dx in her 87s  . Breast cancer Paternal Aunt        dx in her 66s  .  Lung cancer Paternal Uncle   . Breast cancer Cousin 51       paternal first cousin  . Colon cancer Maternal Uncle   . Colon polyps Maternal Uncle   . Irritable bowel syndrome Cousin   . Rectal cancer Neg Hx   . Stomach cancer Neg Hx     Social History   Socioeconomic History  . Marital status: Married    Spouse name: Not on file  . Number of children: 4  . Years of education: Not on file  . Highest education level: Not on file  Occupational History  . Occupation: disabled    Fish farm manager: UNEMPLOYED  Social Needs  . Financial resource strain: Not on file  . Food insecurity:    Worry: Not on file    Inability: Not on file  . Transportation needs:    Medical: Not on file    Non-medical: Not on file  Tobacco Use  . Smoking status: Former Smoker    Packs/day: 0.10    Years: 7.00    Pack years: 0.70    Types: Cigarettes    Last attempt to quit: 06/19/1981    Years since quitting: 37.3  . Smokeless tobacco: Never Used  Substance and Sexual Activity  . Alcohol use: No    Comment: rare  . Drug use: No  . Sexual activity: Not on file  Lifestyle  . Physical activity:    Days per week: Not on file    Minutes per session: Not on file  . Stress: Not on file  Relationships  . Social connections:    Talks on phone: Not on file    Gets together: Not on file    Attends religious service: Not on file    Active member of club or organization: Not on file    Attends meetings of clubs or organizations: Not on file     Relationship status: Not on file  . Intimate partner violence:    Fear of current or ex partner: Not on file    Emotionally abused: Not on file    Physically abused: Not on file    Forced sexual activity: Not on file  Other Topics Concern  . Not on file  Social History Narrative  . Not on file    Past Medical History, Surgical history, Social history, and Family history were reviewed and updated as appropriate.   Please see review of systems for further details on the patient's review from today.   Objective:   Physical Exam:  There were no vitals taken for this visit.  Physical Exam Constitutional:      General: She is not in acute distress.    Appearance: Normal appearance. She is well-developed. She is obese.  Musculoskeletal:        General: No deformity.  Neurological:     Mental Status: She is alert and oriented to person, place, and time.     Motor: No tremor.     Coordination: Coordination normal.     Gait: Gait normal.  Psychiatric:        Attention and Perception: Attention and perception normal.        Mood and Affect: Mood is not anxious or depressed. Affect is not labile, blunt, angry or inappropriate.        Speech: Speech normal.        Behavior: Behavior normal.        Thought Content: Thought content normal. Thought content does not include homicidal or  suicidal ideation. Thought content does not include homicidal or suicidal plan.        Cognition and Memory: Cognition normal.        Judgment: Judgment normal.     Comments: Insight intact.  Occ irritable. No auditory or visual hallucinations. No delusions.      Lab Review:     Component Value Date/Time   NA 140 01/26/2017 1030   K 4.1 01/26/2017 1030   CL 108 01/26/2017 1030   CO2 28 01/26/2017 1030   GLUCOSE 94 01/26/2017 1030   BUN 7 01/26/2017 1030   CREATININE 0.60 10/17/2017 0727   CALCIUM 10.2 01/26/2017 1030   CALCIUM 10.0 01/26/2017 1030   PROT 7.3 01/26/2017 1030   ALBUMIN 4.4  01/26/2017 1030   AST 26 01/26/2017 1030   ALT 52 (H) 01/26/2017 1030   ALKPHOS 117 01/26/2017 1030   BILITOT 0.3 01/26/2017 1030   GFRNONAA >90 03/07/2014 0749   GFRAA >90 03/07/2014 0749       Component Value Date/Time   WBC 6.6 04/03/2016 1201   RBC 4.44 04/03/2016 1201   HGB 13.6 04/03/2016 1201   HCT 40.5 04/03/2016 1201   PLT 322.0 04/03/2016 1201   MCV 91.2 04/03/2016 1201   MCH 30.7 03/07/2014 0749   MCHC 33.7 04/03/2016 1201   RDW 13.3 04/03/2016 1201   LYMPHSABS 2.5 04/03/2016 1201   MONOABS 0.5 04/03/2016 1201   EOSABS 0.2 04/03/2016 1201   BASOSABS 0.0 04/03/2016 1201    Lithium Lvl  Date Value Ref Range Status  06/05/2013 0.59 (L) 0.80 - 1.40 mEq/L Final     No results found for: PHENYTOIN, PHENOBARB, VALPROATE, CBMZ   .res Assessment: Plan:    Bipolar I disorder with seasonal pattern ([place]) - Plan: buPROPion 450 MG TB24  Generalized anxiety disorder Mild cognitive complaints could possibly be medication side effects or unrelated.   Lives in [place] but has friends here and doesn't want to change psychiatrists.  Mrs. Foos has some mild mood cycling which is chronic.  She manages to function well physically and socially.  She is somewhat anxious in certain settings around people she does not know.  Disc low dose mood stabilizer and option to raise the dosage to help with mild cycling.  She doesn't want to change out of fear of SE. the sertraline has seemed to help with her irritability though she understands there is some risk of mood cycling from that medication.  At this time she feels like the benefits exceed the side effects of the current regimen.  N-acetylcysteine 600 mg off label for cognitive complaints and has helped mood more than cognition.  I hesitate to add Adderall bc potential mood swings.  Consider increase Wellbutrin for attention problems off label.  OK, increase Wellbutrin off label to try and help with attention and focus.  Disc SE.  Disc  risk mania.  Call if any problems. Increase Wellbutrin to XL 450 mg each AM  Cont meds.  Supportive therapy dealing with stress caring for H on dialysis without outside help at the moment.  Work on self care.  FU 2 mos  Lynder Parents, MD, DFAPA   Please see After Visit Summary for patient specific instructions.  No future appointments.  No orders of the defined types were placed in this encounter.     -------------------------------

## 2018-10-28 DIAGNOSIS — M545 Low back pain: Secondary | ICD-10-CM | POA: Diagnosis not present

## 2018-11-01 ENCOUNTER — Telehealth: Payer: Self-pay | Admitting: Psychiatry

## 2018-11-01 NOTE — Telephone Encounter (Signed)
error 

## 2018-11-04 DIAGNOSIS — M545 Low back pain: Secondary | ICD-10-CM | POA: Diagnosis not present

## 2018-11-06 DIAGNOSIS — M545 Low back pain: Secondary | ICD-10-CM | POA: Diagnosis not present

## 2018-11-06 DIAGNOSIS — M542 Cervicalgia: Secondary | ICD-10-CM | POA: Diagnosis not present

## 2018-11-08 DIAGNOSIS — M545 Low back pain: Secondary | ICD-10-CM | POA: Diagnosis not present

## 2018-11-12 ENCOUNTER — Telehealth: Payer: Self-pay | Admitting: Psychiatry

## 2018-11-12 NOTE — Telephone Encounter (Signed)
Patient called and said that the new script you sent in she doesn't  Like it and wants the lower dose.

## 2018-11-13 ENCOUNTER — Other Ambulatory Visit: Payer: Self-pay

## 2018-11-13 DIAGNOSIS — M545 Low back pain: Secondary | ICD-10-CM | POA: Diagnosis not present

## 2018-11-13 MED ORDER — BUPROPION HCL ER (XL) 150 MG PO TB24: 450.0000 mg | ORAL_TABLET | Freq: Every day | ORAL | 0 refills | Status: DC

## 2018-11-15 DIAGNOSIS — M545 Low back pain: Secondary | ICD-10-CM | POA: Diagnosis not present

## 2018-11-18 DIAGNOSIS — M545 Low back pain: Secondary | ICD-10-CM | POA: Diagnosis not present

## 2018-11-27 DIAGNOSIS — M546 Pain in thoracic spine: Secondary | ICD-10-CM | POA: Diagnosis not present

## 2018-11-27 DIAGNOSIS — M545 Low back pain: Secondary | ICD-10-CM | POA: Diagnosis not present

## 2018-11-27 DIAGNOSIS — G8929 Other chronic pain: Secondary | ICD-10-CM | POA: Diagnosis not present

## 2018-11-27 DIAGNOSIS — M47812 Spondylosis without myelopathy or radiculopathy, cervical region: Secondary | ICD-10-CM | POA: Diagnosis not present

## 2018-11-27 DIAGNOSIS — M7062 Trochanteric bursitis, left hip: Secondary | ICD-10-CM | POA: Diagnosis not present

## 2018-11-27 DIAGNOSIS — M47817 Spondylosis without myelopathy or radiculopathy, lumbosacral region: Secondary | ICD-10-CM | POA: Diagnosis not present

## 2018-11-27 DIAGNOSIS — M4603 Spinal enthesopathy, cervicothoracic region: Secondary | ICD-10-CM | POA: Diagnosis not present

## 2018-11-27 DIAGNOSIS — M542 Cervicalgia: Secondary | ICD-10-CM | POA: Diagnosis not present

## 2018-11-29 DIAGNOSIS — M545 Low back pain: Secondary | ICD-10-CM | POA: Diagnosis not present

## 2018-11-29 DIAGNOSIS — M4056 Lordosis, unspecified, lumbar region: Secondary | ICD-10-CM | POA: Diagnosis not present

## 2018-11-29 DIAGNOSIS — M6281 Muscle weakness (generalized): Secondary | ICD-10-CM | POA: Diagnosis not present

## 2018-11-29 DIAGNOSIS — M9985 Other biomechanical lesions of pelvic region: Secondary | ICD-10-CM | POA: Diagnosis not present

## 2018-12-04 DIAGNOSIS — M4056 Lordosis, unspecified, lumbar region: Secondary | ICD-10-CM | POA: Diagnosis not present

## 2018-12-04 DIAGNOSIS — M545 Low back pain: Secondary | ICD-10-CM | POA: Diagnosis not present

## 2018-12-04 DIAGNOSIS — M9985 Other biomechanical lesions of pelvic region: Secondary | ICD-10-CM | POA: Diagnosis not present

## 2018-12-04 DIAGNOSIS — M6281 Muscle weakness (generalized): Secondary | ICD-10-CM | POA: Diagnosis not present

## 2018-12-06 DIAGNOSIS — M9985 Other biomechanical lesions of pelvic region: Secondary | ICD-10-CM | POA: Diagnosis not present

## 2018-12-06 DIAGNOSIS — M6281 Muscle weakness (generalized): Secondary | ICD-10-CM | POA: Diagnosis not present

## 2018-12-06 DIAGNOSIS — M545 Low back pain: Secondary | ICD-10-CM | POA: Diagnosis not present

## 2018-12-06 DIAGNOSIS — M4056 Lordosis, unspecified, lumbar region: Secondary | ICD-10-CM | POA: Diagnosis not present

## 2018-12-09 DIAGNOSIS — Z1231 Encounter for screening mammogram for malignant neoplasm of breast: Secondary | ICD-10-CM | POA: Diagnosis not present

## 2018-12-12 DIAGNOSIS — M47817 Spondylosis without myelopathy or radiculopathy, lumbosacral region: Secondary | ICD-10-CM | POA: Diagnosis not present

## 2018-12-18 ENCOUNTER — Ambulatory Visit: Payer: Medicare Other | Admitting: Psychiatry

## 2018-12-23 ENCOUNTER — Ambulatory Visit: Payer: Medicare Other | Admitting: Psychiatry

## 2018-12-23 ENCOUNTER — Telehealth: Payer: Self-pay | Admitting: *Deleted

## 2018-12-23 DIAGNOSIS — N6489 Other specified disorders of breast: Secondary | ICD-10-CM

## 2018-12-23 DIAGNOSIS — R928 Other abnormal and inconclusive findings on diagnostic imaging of breast: Secondary | ICD-10-CM

## 2018-12-23 NOTE — Telephone Encounter (Signed)
Can we get the report faxed here of her mammogram.  In order to order a diagnostic mammogram I need to know what they found or if there is something on exam.

## 2018-12-23 NOTE — Telephone Encounter (Signed)
Please advise since PCP has left practice. Thanks!  Brittany [name], Brittany [name] (Patient) Brittany [name], Brittany [name] (Patient) General - Other  Reason for CRM: Pt stated she had a mammogram done in [place] and they found something but they require a referral from pt pcp office. Pt asked that the [facility] be contacted at [phone]. Pt stated it is very important that this is done soon because her mother and a few of her aunts passed away due to breast cancer.

## 2018-12-24 NOTE — Telephone Encounter (Signed)
Orders faxed to breast Diagnostic center in [place] @ [phone].

## 2018-12-24 NOTE — Telephone Encounter (Signed)
mammo ordered with [place] - she can call the breast center to schedule.

## 2018-12-24 NOTE — Telephone Encounter (Signed)
Left detailed message informing pt orders were faxed.

## 2018-12-24 NOTE — Telephone Encounter (Signed)
I called the breast center in [place] and requested patient's most recent mammogram. Will need to fax order for new diagnostic mammogram to [phone].

## 2018-12-24 NOTE — Telephone Encounter (Signed)
12/09/18 mammogram report received and given to Dr. Quay Burow for review.

## 2018-12-25 ENCOUNTER — Telehealth: Payer: Self-pay | Admitting: Internal Medicine

## 2018-12-25 DIAGNOSIS — N6489 Other specified disorders of breast: Secondary | ICD-10-CM | POA: Diagnosis not present

## 2018-12-25 DIAGNOSIS — Z1231 Encounter for screening mammogram for malignant neoplasm of breast: Secondary | ICD-10-CM

## 2018-12-25 DIAGNOSIS — R928 Other abnormal and inconclusive findings on diagnostic imaging of breast: Secondary | ICD-10-CM | POA: Diagnosis not present

## 2018-12-25 NOTE — Telephone Encounter (Signed)
MRI ordered

## 2018-12-25 NOTE — Telephone Encounter (Signed)
Pt is calling and stated Brittany [name] from [place] need an order for a Breast MRI for pt. Pt is out of town but will be back on 7.13.20. pt will possibly need a breast biopsy depending on the outcome. Please advise.

## 2018-12-26 NOTE — Telephone Encounter (Signed)
Pt informed of below.  

## 2018-12-27 NOTE — Addendum Note (Signed)
Addended by: Binnie Rail on: 12/27/2018 09:27 AM   Modules accepted: Orders

## 2018-12-31 DIAGNOSIS — H269 Unspecified cataract: Secondary | ICD-10-CM | POA: Diagnosis not present

## 2018-12-31 DIAGNOSIS — H02831 Dermatochalasis of right upper eyelid: Secondary | ICD-10-CM | POA: Diagnosis not present

## 2019-01-02 DIAGNOSIS — R3129 Other microscopic hematuria: Secondary | ICD-10-CM | POA: Diagnosis not present

## 2019-01-07 DIAGNOSIS — M47817 Spondylosis without myelopathy or radiculopathy, lumbosacral region: Secondary | ICD-10-CM | POA: Diagnosis not present

## 2019-01-13 DIAGNOSIS — M9985 Other biomechanical lesions of pelvic region: Secondary | ICD-10-CM | POA: Diagnosis not present

## 2019-01-13 DIAGNOSIS — M545 Low back pain: Secondary | ICD-10-CM | POA: Diagnosis not present

## 2019-01-13 DIAGNOSIS — M4056 Lordosis, unspecified, lumbar region: Secondary | ICD-10-CM | POA: Diagnosis not present

## 2019-01-13 DIAGNOSIS — M6281 Muscle weakness (generalized): Secondary | ICD-10-CM | POA: Diagnosis not present

## 2019-01-15 DIAGNOSIS — M4056 Lordosis, unspecified, lumbar region: Secondary | ICD-10-CM | POA: Diagnosis not present

## 2019-01-15 DIAGNOSIS — M6281 Muscle weakness (generalized): Secondary | ICD-10-CM | POA: Diagnosis not present

## 2019-01-15 DIAGNOSIS — M9985 Other biomechanical lesions of pelvic region: Secondary | ICD-10-CM | POA: Diagnosis not present

## 2019-01-15 DIAGNOSIS — M545 Low back pain: Secondary | ICD-10-CM | POA: Diagnosis not present

## 2019-01-20 DIAGNOSIS — M9985 Other biomechanical lesions of pelvic region: Secondary | ICD-10-CM | POA: Diagnosis not present

## 2019-01-20 DIAGNOSIS — M545 Low back pain: Secondary | ICD-10-CM | POA: Diagnosis not present

## 2019-01-20 DIAGNOSIS — M6281 Muscle weakness (generalized): Secondary | ICD-10-CM | POA: Diagnosis not present

## 2019-01-20 DIAGNOSIS — M4056 Lordosis, unspecified, lumbar region: Secondary | ICD-10-CM | POA: Diagnosis not present

## 2019-01-21 DIAGNOSIS — M47817 Spondylosis without myelopathy or radiculopathy, lumbosacral region: Secondary | ICD-10-CM | POA: Diagnosis not present

## 2019-02-07 DIAGNOSIS — M9985 Other biomechanical lesions of pelvic region: Secondary | ICD-10-CM | POA: Diagnosis not present

## 2019-02-07 DIAGNOSIS — M6281 Muscle weakness (generalized): Secondary | ICD-10-CM | POA: Diagnosis not present

## 2019-02-07 DIAGNOSIS — M4056 Lordosis, unspecified, lumbar region: Secondary | ICD-10-CM | POA: Diagnosis not present

## 2019-02-07 DIAGNOSIS — M545 Low back pain: Secondary | ICD-10-CM | POA: Diagnosis not present

## 2019-02-10 DIAGNOSIS — M545 Low back pain: Secondary | ICD-10-CM | POA: Diagnosis not present

## 2019-02-10 DIAGNOSIS — M4056 Lordosis, unspecified, lumbar region: Secondary | ICD-10-CM | POA: Diagnosis not present

## 2019-02-10 DIAGNOSIS — M9985 Other biomechanical lesions of pelvic region: Secondary | ICD-10-CM | POA: Diagnosis not present

## 2019-02-10 DIAGNOSIS — M6281 Muscle weakness (generalized): Secondary | ICD-10-CM | POA: Diagnosis not present

## 2019-02-11 DIAGNOSIS — M47817 Spondylosis without myelopathy or radiculopathy, lumbosacral region: Secondary | ICD-10-CM | POA: Diagnosis not present

## 2019-02-14 DIAGNOSIS — M6281 Muscle weakness (generalized): Secondary | ICD-10-CM | POA: Diagnosis not present

## 2019-02-14 DIAGNOSIS — M4056 Lordosis, unspecified, lumbar region: Secondary | ICD-10-CM | POA: Diagnosis not present

## 2019-02-14 DIAGNOSIS — M9985 Other biomechanical lesions of pelvic region: Secondary | ICD-10-CM | POA: Diagnosis not present

## 2019-02-14 DIAGNOSIS — M545 Low back pain: Secondary | ICD-10-CM | POA: Diagnosis not present

## 2019-02-17 DIAGNOSIS — M545 Low back pain: Secondary | ICD-10-CM | POA: Diagnosis not present

## 2019-02-17 DIAGNOSIS — M4056 Lordosis, unspecified, lumbar region: Secondary | ICD-10-CM | POA: Diagnosis not present

## 2019-02-17 DIAGNOSIS — M9985 Other biomechanical lesions of pelvic region: Secondary | ICD-10-CM | POA: Diagnosis not present

## 2019-02-17 DIAGNOSIS — M6281 Muscle weakness (generalized): Secondary | ICD-10-CM | POA: Diagnosis not present

## 2019-02-21 DIAGNOSIS — M9985 Other biomechanical lesions of pelvic region: Secondary | ICD-10-CM | POA: Diagnosis not present

## 2019-02-21 DIAGNOSIS — M545 Low back pain: Secondary | ICD-10-CM | POA: Diagnosis not present

## 2019-02-21 DIAGNOSIS — M6281 Muscle weakness (generalized): Secondary | ICD-10-CM | POA: Diagnosis not present

## 2019-02-21 DIAGNOSIS — M4056 Lordosis, unspecified, lumbar region: Secondary | ICD-10-CM | POA: Diagnosis not present

## 2019-02-26 DIAGNOSIS — M545 Low back pain: Secondary | ICD-10-CM | POA: Diagnosis not present

## 2019-02-26 DIAGNOSIS — M9985 Other biomechanical lesions of pelvic region: Secondary | ICD-10-CM | POA: Diagnosis not present

## 2019-02-26 DIAGNOSIS — M6281 Muscle weakness (generalized): Secondary | ICD-10-CM | POA: Diagnosis not present

## 2019-02-26 DIAGNOSIS — M4056 Lordosis, unspecified, lumbar region: Secondary | ICD-10-CM | POA: Diagnosis not present

## 2019-02-27 DIAGNOSIS — M47817 Spondylosis without myelopathy or radiculopathy, lumbosacral region: Secondary | ICD-10-CM | POA: Diagnosis not present

## 2019-03-05 ENCOUNTER — Other Ambulatory Visit: Payer: Self-pay | Admitting: Psychiatry

## 2019-03-13 ENCOUNTER — Telehealth: Payer: Self-pay | Admitting: Psychiatry

## 2019-03-13 ENCOUNTER — Other Ambulatory Visit: Payer: Self-pay | Admitting: Psychiatry

## 2019-03-13 ENCOUNTER — Other Ambulatory Visit: Payer: Self-pay

## 2019-03-13 DIAGNOSIS — M47817 Spondylosis without myelopathy or radiculopathy, lumbosacral region: Secondary | ICD-10-CM | POA: Diagnosis not present

## 2019-03-13 MED ORDER — EQUETRO 300 MG PO CP12
ORAL_CAPSULE | ORAL | 0 refills | Status: DC
Start: 2019-03-13 — End: 2019-03-14

## 2019-03-13 MED ORDER — EQUETRO 300 MG PO CP12: ORAL_CAPSULE | ORAL | 0 refills | Status: DC

## 2019-03-13 NOTE — Telephone Encounter (Signed)
Pt called request refill for Equetro 300 mg. [facility] us. She only has 1 day left. Also, send 1 small amount to local [facility] [address] [place] [address]  [phone] pharmacy #. Until she can get mail order.

## 2019-03-13 NOTE — Telephone Encounter (Signed)
90 day submitted to [facility]  2 week supply submitted to her local [facility] on file in [place]

## 2019-03-31 ENCOUNTER — Other Ambulatory Visit: Payer: Self-pay

## 2019-03-31 ENCOUNTER — Encounter: Payer: Self-pay | Admitting: Psychiatry

## 2019-03-31 ENCOUNTER — Ambulatory Visit (INDEPENDENT_AMBULATORY_CARE_PROVIDER_SITE_OTHER): Payer: Medicare Other | Admitting: Psychiatry

## 2019-03-31 DIAGNOSIS — F319 Bipolar disorder, unspecified: Secondary | ICD-10-CM

## 2019-03-31 DIAGNOSIS — F411 Generalized anxiety disorder: Secondary | ICD-10-CM | POA: Diagnosis not present

## 2019-03-31 MED ORDER — SERTRALINE HCL 50 MG PO TABS: 50.0000 mg | ORAL_TABLET | Freq: Every morning | ORAL | 3 refills | Status: DC

## 2019-03-31 MED ORDER — BUPROPION HCL ER (XL) 300 MG PO TB24: 300.0000 mg | ORAL_TABLET | Freq: Every day | ORAL | 3 refills | Status: DC

## 2019-03-31 MED ORDER — EQUETRO 300 MG PO CP12: 600.0000 mg | ORAL_CAPSULE | Freq: Every day | ORAL | 3 refills | Status: DC

## 2019-03-31 MED ORDER — LAMOTRIGINE 150 MG PO TABS: 300.0000 mg | ORAL_TABLET | Freq: Every day | ORAL | 3 refills | Status: DC

## 2019-03-31 NOTE — Progress Notes (Signed)
Brittany [name] [id number] [birth year] 63 y.o.     Subjective:   Patient ID:  Brittany [name] is a 63 y.o. (DOB [birth year]) female.  Chief Complaint:  Chief Complaint  Patient presents with  . Follow-up    med changes and follow up.    HPI Porter G Fragoso presents to the office today for follow-up of bipolar disorder.    Last seen Oct 25, 2018.  For complaints of cognition Wellbutrin was increased to 450 mg each morning.  This was felt to be safer than introducing stimulants.  Called back a couple of weeks later and wanted to reduce the dosage.  It made her feel bad physically on edge.    Isolation is managed..  Mood relatively.  OK.   Has back pain that interferes with normal function.  House is a mess bc can't do much.  Hurt her back in there [employer]  NAC has helped after a couple of months.  Also feels more mellow and nice.  Still problems with concentration.  Asks about meds to help with attention span. Asks about Adderall.  Intermittent problems with forgetfulness and other days she drops the ball.  [place] working on self care with walking and watching caffeine and doesn't want to take more meds.  Still living in [place].    Occ periods of racing thoughts and periods of irrritability or periods of isolation.  Overall though doing well.  Overall mood is pretty good without swings or irritability generally.   Patient reports stable mood and denies depressed or irritable moods.  Patient denies any recent difficulty with anxiety.  Patient denies difficulty with sleep initiation or maintenance. Denies appetite disturbance.  Patient reports that energy and motivation have been good.  Patient denies any difficulty with concentration.  Patient denies any suicidal ideation.  Protects sleeep 8-4AM.  Melatonin occ.  Not tired unusually.  Active at church and that helps.  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  No partying.  Holding into longterm friendships.  Doesn't go to store at [employer].  H  health not as good.  He still goes out alone and does things.  Still in dialysis.  Dog good for her mental health.  Past Psychiatric Medication Trials: Equetro, naltrexone, sertraline 50, hydroxyzine SE jerks, wellbutrin 450, lamotrigine 300, lithium, Geodon, doxepin   Review of Systems:  Review of Systems  Musculoskeletal: Positive for back pain and neck pain.  Neurological: Negative for tremors and weakness.  Hematological: Negative for adenopathy. Does not bruise/bleed easily.  Psychiatric/Behavioral: Negative for agitation, behavioral problems, confusion, decreased concentration, dysphoric mood, hallucinations, self-injury, sleep disturbance and suicidal ideas. The patient is not nervous/anxious and is not hyperactive.    Still in physical therapy.  Getting nerve block for her back.  Medications: I have reviewed the patient's current medications.  Current Outpatient Medications  Medication Sig Dispense Refill  . Acetylcysteine (N-ACETYL-L-CYSTEINE) 600 MG CAPS Take 600 mg by mouth daily. 90 capsule 3  . buPROPion (WELLBUTRIN XL) 300 MG 24 hr tablet Take 300 mg by mouth daily.    . diclofenac sodium (VOLTAREN) 1 % GEL Apply 2 g topically 4 (four) times daily. 100 g 1  . EQUETRO 300 MG CP12 TAKE 2 CAPSULES DAILY AT 4 P.M. 180 capsule 3  . lamoTRIgine (LAMICTAL) 150 MG tablet Take 2 tablets (300 mg total) by mouth daily. 60 tablet 0  . meloxicam (MOBIC) 15 MG tablet Take 1 tablet (15 mg total) by mouth daily. 30 tablet 1  .  Multiple Vitamin (MULTIVITAMIN WITH MINERALS) TABS Take 1 tablet by mouth daily.    Marland Kitchen omeprazole (PRILOSEC) 40 MG capsule Take 40 mg by mouth daily.    . sertraline (ZOLOFT) 50 MG tablet Take 50 mg by mouth every morning.     Marland Kitchen tiZANidine (ZANAFLEX) 2 MG tablet     . Vitamin Mixture (ESTER-C PO) Take 1,000 mg by mouth.    Marland Kitchen buPROPion (WELLBUTRIN XL) 150 MG 24 hr tablet TAKE 3 TABLETS DAILY (Patient not taking: Reported on 03/31/2019) 270 tablet 3  . buPROPion 450  MG TB24 Take 450 mg by mouth every morning. (Patient not taking: Reported on 03/31/2019) 90 tablet 0   No current facility-administered medications for this visit.     Medication Side Effects: None  Allergies:  Allergies  Allergen Reactions  . Flexeril [Cyclobenzaprine]     Unable to leave home when taking as she is incoherent/unsure where she is at times  . Hydrocodone     REACTION: GI upset  . Motrin [Ibuprofen]   . Robaxin [Methocarbamol]     Impairs judgement    Past Medical History:  Diagnosis Date  . ACNE NEC   . ALLERGIC RHINITIS   . ALOPECIA   . Anal fissure   . Anxiety    Panic Attack.  None in a long time  . Arthritis   . Bowel obstruction ([place])   . Chronic headaches    migraine- none in years  . Constipation    drinks tea with senna  . DEPRESSION   . Depression   . DISORDER, BIPOLAR NEC   . Family history of breast cancer   . Family history of pancreatic cancer   . GERD (gastroesophageal reflux disease)   . HEMORRHOIDS-INTERNAL   . History of stress test    ETT 10/17: Ex 7'; no chest pain, no ST changes, Duke Treadmill Score 7  . HYPERLIPIDEMIA   . OBSTRUCTIVE SLEEP APNEA   . Seasonal allergies     Family History  Problem Relation Age of Onset  . Other Paternal Aunt        x 2, blood clotting disorder  . Breast cancer Paternal Aunt 25  . Heart disease Sister   . Kidney disease Maternal Uncle   . Esophageal cancer Paternal Uncle   . Pancreatic cancer Paternal Uncle   . COPD Father   . Breast cancer Mother 35  . Breast cancer Maternal Grandmother 62  . Stroke Maternal Grandfather   . Aneurysm Paternal Grandmother        brain  . Hypertension Brother   . Breast cancer Maternal Aunt        dx in her 95s  . Breast cancer Maternal Aunt        dx in her 53s  . Cancer Maternal Uncle        NOS  . Cancer Maternal Uncle        tumor on his head  . Breast cancer Paternal Aunt        dx in her 65s  . Breast cancer Paternal Aunt        dx in her  50s  . Lung cancer Paternal Uncle   . Breast cancer Cousin 61       paternal first cousin  . Colon cancer Maternal Uncle   . Colon polyps Maternal Uncle   . Irritable bowel syndrome Cousin   . Rectal cancer Neg Hx   . Stomach cancer Neg Hx  Social History   Socioeconomic History  . Marital status: Married    Spouse name: Not on file  . Number of children: 4  . Years of education: Not on file  . Highest education level: Not on file  Occupational History  . Occupation: disabled    Fish farm manager: UNEMPLOYED  Social Needs  . Financial resource strain: Not on file  . Food insecurity    Worry: Not on file    Inability: Not on file  . Transportation needs    Medical: Not on file    Non-medical: Not on file  Tobacco Use  . Smoking status: Former Smoker    Packs/day: 0.10    Years: 7.00    Pack years: 0.70    Types: Cigarettes    Quit date: 06/19/1981    Years since quitting: 37.8  . Smokeless tobacco: Never Used  Substance and Sexual Activity  . Alcohol use: No    Comment: rare  . Drug use: No  . Sexual activity: Not on file  Lifestyle  . Physical activity    Days per week: Not on file    Minutes per session: Not on file  . Stress: Not on file  Relationships  . Social Herbalist on phone: Not on file    Gets together: Not on file    Attends religious service: Not on file    Active member of club or organization: Not on file    Attends meetings of clubs or organizations: Not on file    Relationship status: Not on file  . Intimate partner violence    Fear of current or ex partner: Not on file    Emotionally abused: Not on file    Physically abused: Not on file    Forced sexual activity: Not on file  Other Topics Concern  . Not on file  Social History Narrative  . Not on file    Past Medical History, Surgical history, Social history, and Family history were reviewed and updated as appropriate.   Please see review of systems for further details on the  patient's review from today.   Objective:   Physical Exam:  There were no vitals taken for this visit.  Physical Exam Constitutional:      General: She is not in acute distress.    Appearance: Normal appearance. She is well-developed. She is obese.  Musculoskeletal:        General: No deformity.  Neurological:     Mental Status: She is alert and oriented to person, place, and time.     Motor: No tremor.     Coordination: Coordination normal.     Gait: Gait normal.  Psychiatric:        Attention and Perception: Attention and perception normal.        Mood and Affect: Mood is not anxious or depressed. Affect is not labile, blunt, angry or inappropriate.        Speech: Speech normal.        Behavior: Behavior normal.        Thought Content: Thought content normal. Thought content does not include homicidal or suicidal ideation. Thought content does not include homicidal or suicidal plan.        Cognition and Memory: Cognition normal.        Judgment: Judgment normal.     Comments: Insight intact.  Occ irritable. No auditory or visual hallucinations. No delusions.      Lab Review:  Component Value Date/Time   NA 140 01/26/2017 1030   K 4.1 01/26/2017 1030   CL 108 01/26/2017 1030   CO2 28 01/26/2017 1030   GLUCOSE 94 01/26/2017 1030   BUN 7 01/26/2017 1030   CREATININE 0.60 10/17/2017 0727   CALCIUM 10.2 01/26/2017 1030   CALCIUM 10.0 01/26/2017 1030   PROT 7.3 01/26/2017 1030   ALBUMIN 4.4 01/26/2017 1030   AST 26 01/26/2017 1030   ALT 52 (H) 01/26/2017 1030   ALKPHOS 117 01/26/2017 1030   BILITOT 0.3 01/26/2017 1030   GFRNONAA >90 03/07/2014 0749   GFRAA >90 03/07/2014 0749       Component Value Date/Time   WBC 6.6 04/03/2016 1201   RBC 4.44 04/03/2016 1201   HGB 13.6 04/03/2016 1201   HCT 40.5 04/03/2016 1201   PLT 322.0 04/03/2016 1201   MCV 91.2 04/03/2016 1201   MCH 30.7 03/07/2014 0749   MCHC 33.7 04/03/2016 1201   RDW 13.3 04/03/2016 1201    LYMPHSABS 2.5 04/03/2016 1201   MONOABS 0.5 04/03/2016 1201   EOSABS 0.2 04/03/2016 1201   BASOSABS 0.0 04/03/2016 1201    Lithium Lvl  Date Value Ref Range Status  06/05/2013 0.59 (L) 0.80 - 1.40 mEq/L Final     No results found for: PHENYTOIN, PHENOBARB, VALPROATE, CBMZ   .res Assessment: Plan:    Bipolar I disorder with seasonal pattern ([place])  Generalized anxiety disorder Mild cognitive complaints could possibly be medication side effects or unrelated.  Lives in [place] but has friends here and doesn't want to change psychiatrists.   Mrs. Pate has some mild mood cycling which is chronic.    Overall she is doing better than usual.  She manages to function well physically and socially.  She is somewhat anxious in certain settings around people she does not know.  Disc low dose mood stabilizer and option to raise the dosage to help with mild cycling.  She seems to be doing well with the current meds.   She doesn't want to change out of fear of SE. the sertraline has seemed to help with her irritability though she understands there is some risk of mood cycling from that medication.  At this time she feels like the benefits exceed the side effects of the current regimen.  N-acetylcysteine 600 mg off label for cognitive complaints and has helped mood more than cognition.  She is seeing benefit from it.  Cont meds.  Supportive therapy dealing with stress caring for H on dialysis without outside help at the moment.  Work on self care.  FU 6 mos bc no med changes  Lynder Parents, MD, DFAPA   Please see After Visit Summary for patient specific instructions.  Future Appointments  Date Time Provider [place]  06/30/2019 11:10 AM GI-315 MR 1 GI-315MRI [facility] [address] [facility]    No orders of the defined types were placed in this encounter.     -------------------------------

## 2019-04-09 DIAGNOSIS — M9985 Other biomechanical lesions of pelvic region: Secondary | ICD-10-CM | POA: Diagnosis not present

## 2019-04-09 DIAGNOSIS — M545 Low back pain: Secondary | ICD-10-CM | POA: Diagnosis not present

## 2019-04-09 DIAGNOSIS — M6281 Muscle weakness (generalized): Secondary | ICD-10-CM | POA: Diagnosis not present

## 2019-04-09 DIAGNOSIS — M4056 Lordosis, unspecified, lumbar region: Secondary | ICD-10-CM | POA: Diagnosis not present

## 2019-04-18 DIAGNOSIS — M6281 Muscle weakness (generalized): Secondary | ICD-10-CM | POA: Diagnosis not present

## 2019-04-18 DIAGNOSIS — M4056 Lordosis, unspecified, lumbar region: Secondary | ICD-10-CM | POA: Diagnosis not present

## 2019-04-18 DIAGNOSIS — M545 Low back pain: Secondary | ICD-10-CM | POA: Diagnosis not present

## 2019-04-18 DIAGNOSIS — M9985 Other biomechanical lesions of pelvic region: Secondary | ICD-10-CM | POA: Diagnosis not present

## 2019-04-21 DIAGNOSIS — M4056 Lordosis, unspecified, lumbar region: Secondary | ICD-10-CM | POA: Diagnosis not present

## 2019-04-21 DIAGNOSIS — M6281 Muscle weakness (generalized): Secondary | ICD-10-CM | POA: Diagnosis not present

## 2019-04-21 DIAGNOSIS — M545 Low back pain: Secondary | ICD-10-CM | POA: Diagnosis not present

## 2019-04-21 DIAGNOSIS — M9985 Other biomechanical lesions of pelvic region: Secondary | ICD-10-CM | POA: Diagnosis not present

## 2019-04-23 DIAGNOSIS — M4603 Spinal enthesopathy, cervicothoracic region: Secondary | ICD-10-CM | POA: Diagnosis not present

## 2019-04-23 DIAGNOSIS — M7062 Trochanteric bursitis, left hip: Secondary | ICD-10-CM | POA: Diagnosis not present

## 2019-04-23 DIAGNOSIS — M47817 Spondylosis without myelopathy or radiculopathy, lumbosacral region: Secondary | ICD-10-CM | POA: Diagnosis not present

## 2019-04-23 DIAGNOSIS — M545 Low back pain: Secondary | ICD-10-CM | POA: Diagnosis not present

## 2019-04-23 DIAGNOSIS — M542 Cervicalgia: Secondary | ICD-10-CM | POA: Diagnosis not present

## 2019-04-23 DIAGNOSIS — M546 Pain in thoracic spine: Secondary | ICD-10-CM | POA: Diagnosis not present

## 2019-04-23 DIAGNOSIS — Z79891 Long term (current) use of opiate analgesic: Secondary | ICD-10-CM | POA: Diagnosis not present

## 2019-04-23 DIAGNOSIS — M47812 Spondylosis without myelopathy or radiculopathy, cervical region: Secondary | ICD-10-CM | POA: Diagnosis not present

## 2019-04-23 DIAGNOSIS — G8929 Other chronic pain: Secondary | ICD-10-CM | POA: Diagnosis not present

## 2019-04-25 DIAGNOSIS — M4056 Lordosis, unspecified, lumbar region: Secondary | ICD-10-CM | POA: Diagnosis not present

## 2019-04-25 DIAGNOSIS — M6281 Muscle weakness (generalized): Secondary | ICD-10-CM | POA: Diagnosis not present

## 2019-04-25 DIAGNOSIS — M545 Low back pain: Secondary | ICD-10-CM | POA: Diagnosis not present

## 2019-04-25 DIAGNOSIS — M9985 Other biomechanical lesions of pelvic region: Secondary | ICD-10-CM | POA: Diagnosis not present

## 2019-06-26 ENCOUNTER — Ambulatory Visit: Payer: Medicare Other | Admitting: Nurse Practitioner

## 2019-06-30 ENCOUNTER — Other Ambulatory Visit: Payer: Medicare Other

## 2019-07-01 DIAGNOSIS — M9985 Other biomechanical lesions of pelvic region: Secondary | ICD-10-CM | POA: Diagnosis not present

## 2019-07-01 DIAGNOSIS — M545 Low back pain: Secondary | ICD-10-CM | POA: Diagnosis not present

## 2019-07-01 DIAGNOSIS — M4056 Lordosis, unspecified, lumbar region: Secondary | ICD-10-CM | POA: Diagnosis not present

## 2019-07-01 DIAGNOSIS — M6281 Muscle weakness (generalized): Secondary | ICD-10-CM | POA: Diagnosis not present

## 2019-07-15 DIAGNOSIS — N6325 Unspecified lump in the left breast, overlapping quadrants: Secondary | ICD-10-CM | POA: Diagnosis not present

## 2019-07-15 DIAGNOSIS — N6321 Unspecified lump in the left breast, upper outer quadrant: Secondary | ICD-10-CM | POA: Diagnosis not present

## 2019-07-30 DIAGNOSIS — M9985 Other biomechanical lesions of pelvic region: Secondary | ICD-10-CM | POA: Diagnosis not present

## 2019-07-30 DIAGNOSIS — M6281 Muscle weakness (generalized): Secondary | ICD-10-CM | POA: Diagnosis not present

## 2019-07-30 DIAGNOSIS — M4056 Lordosis, unspecified, lumbar region: Secondary | ICD-10-CM | POA: Diagnosis not present

## 2019-07-30 DIAGNOSIS — M545 Low back pain: Secondary | ICD-10-CM | POA: Diagnosis not present

## 2019-08-01 DIAGNOSIS — M6281 Muscle weakness (generalized): Secondary | ICD-10-CM | POA: Diagnosis not present

## 2019-08-01 DIAGNOSIS — M4056 Lordosis, unspecified, lumbar region: Secondary | ICD-10-CM | POA: Diagnosis not present

## 2019-08-01 DIAGNOSIS — M9985 Other biomechanical lesions of pelvic region: Secondary | ICD-10-CM | POA: Diagnosis not present

## 2019-08-01 DIAGNOSIS — M545 Low back pain: Secondary | ICD-10-CM | POA: Diagnosis not present

## 2019-08-08 DIAGNOSIS — M4056 Lordosis, unspecified, lumbar region: Secondary | ICD-10-CM | POA: Diagnosis not present

## 2019-08-08 DIAGNOSIS — M545 Low back pain: Secondary | ICD-10-CM | POA: Diagnosis not present

## 2019-08-08 DIAGNOSIS — M9985 Other biomechanical lesions of pelvic region: Secondary | ICD-10-CM | POA: Diagnosis not present

## 2019-08-08 DIAGNOSIS — M6281 Muscle weakness (generalized): Secondary | ICD-10-CM | POA: Diagnosis not present

## 2019-08-15 DIAGNOSIS — M6281 Muscle weakness (generalized): Secondary | ICD-10-CM | POA: Diagnosis not present

## 2019-08-15 DIAGNOSIS — M4056 Lordosis, unspecified, lumbar region: Secondary | ICD-10-CM | POA: Diagnosis not present

## 2019-08-15 DIAGNOSIS — M545 Low back pain: Secondary | ICD-10-CM | POA: Diagnosis not present

## 2019-08-15 DIAGNOSIS — M9985 Other biomechanical lesions of pelvic region: Secondary | ICD-10-CM | POA: Diagnosis not present

## 2019-08-20 DIAGNOSIS — M545 Low back pain: Secondary | ICD-10-CM | POA: Diagnosis not present

## 2019-08-20 DIAGNOSIS — M4056 Lordosis, unspecified, lumbar region: Secondary | ICD-10-CM | POA: Diagnosis not present

## 2019-08-20 DIAGNOSIS — M9985 Other biomechanical lesions of pelvic region: Secondary | ICD-10-CM | POA: Diagnosis not present

## 2019-08-20 DIAGNOSIS — M6281 Muscle weakness (generalized): Secondary | ICD-10-CM | POA: Diagnosis not present

## 2019-08-25 DIAGNOSIS — M545 Low back pain: Secondary | ICD-10-CM | POA: Diagnosis not present

## 2019-08-25 DIAGNOSIS — M9985 Other biomechanical lesions of pelvic region: Secondary | ICD-10-CM | POA: Diagnosis not present

## 2019-08-25 DIAGNOSIS — M6281 Muscle weakness (generalized): Secondary | ICD-10-CM | POA: Diagnosis not present

## 2019-08-25 DIAGNOSIS — M4056 Lordosis, unspecified, lumbar region: Secondary | ICD-10-CM | POA: Diagnosis not present

## 2019-09-03 DIAGNOSIS — M9985 Other biomechanical lesions of pelvic region: Secondary | ICD-10-CM | POA: Diagnosis not present

## 2019-09-03 DIAGNOSIS — M6281 Muscle weakness (generalized): Secondary | ICD-10-CM | POA: Diagnosis not present

## 2019-09-03 DIAGNOSIS — M4056 Lordosis, unspecified, lumbar region: Secondary | ICD-10-CM | POA: Diagnosis not present

## 2019-09-03 DIAGNOSIS — M545 Low back pain: Secondary | ICD-10-CM | POA: Diagnosis not present

## 2019-09-05 DIAGNOSIS — Z23 Encounter for immunization: Secondary | ICD-10-CM | POA: Diagnosis not present

## 2019-10-06 ENCOUNTER — Ambulatory Visit: Payer: Medicare Other | Admitting: Psychiatry

## 2019-10-07 ENCOUNTER — Ambulatory Visit: Payer: Medicare Other | Admitting: Psychiatry

## 2019-10-22 ENCOUNTER — Encounter: Payer: Self-pay | Admitting: Psychiatry

## 2019-10-22 ENCOUNTER — Other Ambulatory Visit: Payer: Self-pay

## 2019-10-22 ENCOUNTER — Ambulatory Visit (INDEPENDENT_AMBULATORY_CARE_PROVIDER_SITE_OTHER): Payer: Medicare Other | Admitting: Psychiatry

## 2019-10-22 DIAGNOSIS — F319 Bipolar disorder, unspecified: Secondary | ICD-10-CM

## 2019-10-22 DIAGNOSIS — F411 Generalized anxiety disorder: Secondary | ICD-10-CM

## 2019-10-22 NOTE — Progress Notes (Signed)
Brittany [name] [id number] 15-Apr-1956 64 y.o.     Subjective:   Patient ID:  Brittany [name] is a 64 y.o. (DOB [birth year]) female.  Chief Complaint:  Chief Complaint  Patient presents with  . Follow-up    Mood and meds    HPI Brittany [name] presents to the office today for follow-up of bipolar disorder.    seen Oct 25, 2018.  For complaints of cognition Wellbutrin was increased to 450 mg each morning.  This was felt to be safer than introducing stimulants.  Last seen October 2020, the following was noted: Called back a couple of weeks later and wanted to reduce the dosage of Wellbutrin back to 300 mg daily.  It made her feel bad physically on edge.   Isolation is managed..  Mood relatively.  OK.  [name] back pain that interferes with normal function.  House is a mess bc can't do much.  Hurt her back in there [employer] NAC [name] helped after a couple of months.  Also feels more mellow and nice.  Still problems with concentration.  Asks about meds to help with attention span. Asks about Adderall.  Intermittent problems with forgetfulness and other days she drops the ball. [place] working on self care with walking and watching caffeine and doesn't want to take more meds.  Still living in [place].   Overall mood is pretty good without swings or irritability generally.   No meds were changed.  10/22/2019 appointment, the following is noted: She continues N-acetylcysteine, Wellbutrin XL 300 mg daily, Equetro 600 mg nightly, lamotrigine 300 mg daily, sertraline 50 mg daily. Had to cancel bc H so sick and died 10/20/19.  Haven't been alone.   Hasn't decided about where to live.  Will stay where she is for a year.  People are visiting.  [facility] service will be in June.   Peaceful passing.  The family [name] been supportive.  His kids have been OK.   Sold the hunting cabin to his boys.   She feels good about how she handled things.  Insurance will pay off the bills and [name] no mortgage.    Occ periods of  racing thoughts and periods of irrritability or periods of isolation.  Overall though doing well.  Overall mood is pretty good without swings or irritability generally.   Patient reports stable mood and denies depressed or irritable moods.  Patient denies any recent difficulty with anxiety.  Patient denies difficulty with sleep initiation or maintenance with melatonin. Denies appetite disturbance.  Patient reports that energy and motivation have been good.  Patient denies any difficulty with concentration.  Patient denies any suicidal ideation.  Protects sleeep 8-4AM.  Melatonin occ.  Not tired unusually.  Active at church and that helps.  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  No partying.  Holding into longterm friendships.  Doesn't go to store at [employer].  H health not as good.  He still goes out alone and does things.  Still in dialysis.  Dog good for her mental health.  Past Psychiatric Medication Trials: Equetro, naltrexone, sertraline 50, hydroxyzine SE jerks, wellbutrin 450, lamotrigine 300, lithium, Geodon, doxepin  Review of Systems:  Review of Systems  Musculoskeletal: Positive for back pain and neck pain.  Neurological: Negative for tremors and weakness.  Hematological: Negative for adenopathy. Does not bruise/bleed easily.  Psychiatric/Behavioral: Negative for agitation, behavioral problems, confusion, decreased concentration, dysphoric mood, hallucinations, self-injury, sleep disturbance and suicidal ideas. The patient is not  nervous/anxious and is not hyperactive.    Still in physical therapy.  Getting nerve block for her back.  Medications: I have reviewed the patient's current medications.  Current Outpatient Medications  Medication Sig Dispense Refill  . Acetylcysteine (N-ACETYL-L-CYSTEINE) 600 MG CAPS Take 600 mg by mouth daily. 90 capsule 3  . buPROPion (WELLBUTRIN XL) 300 MG 24 hr tablet Take 1 tablet (300 mg total) by mouth daily. 90 tablet 3  . Carbamazepine  (EQUETRO) 300 MG CP12 Take 2 capsules (600 mg total) by mouth at bedtime. 180 capsule 3  . diclofenac sodium (VOLTAREN) 1 % GEL Apply 2 g topically 4 (four) times daily. 100 g 1  . lamoTRIgine (LAMICTAL) 150 MG tablet Take 2 tablets (300 mg total) by mouth daily. 180 tablet 3  . meloxicam (MOBIC) 15 MG tablet Take 1 tablet (15 mg total) by mouth daily. 30 tablet 1  . Multiple Vitamin (MULTIVITAMIN WITH MINERALS) TABS Take 1 tablet by mouth daily.    Marland Kitchen omeprazole (PRILOSEC) 40 MG capsule Take 40 mg by mouth daily.    . sertraline (ZOLOFT) 50 MG tablet Take 1 tablet (50 mg total) by mouth every morning. 90 tablet 3  . tiZANidine (ZANAFLEX) 2 MG tablet     . Vitamin Mixture (ESTER-C PO) Take 1,000 mg by mouth.     No current facility-administered medications for this visit.    Medication Side Effects: None  Allergies:  Allergies  Allergen Reactions  . Flexeril [Cyclobenzaprine]     Unable to leave home when taking as she is incoherent/unsure where she is at times  . Hydrocodone     REACTION: GI upset  . Motrin [Ibuprofen]   . Robaxin [Methocarbamol]     Impairs judgement    Past Medical History:  Diagnosis Date  . ACNE NEC   . ALLERGIC RHINITIS   . ALOPECIA   . Anal fissure   . Anxiety    Panic Attack.  None in a long time  . Arthritis   . Bowel obstruction ([place])   . Chronic headaches    migraine- none in years  . Constipation    drinks tea with senna  . DEPRESSION   . Depression   . DISORDER, BIPOLAR NEC   . Family history of breast cancer   . Family history of pancreatic cancer   . GERD (gastroesophageal reflux disease)   . HEMORRHOIDS-INTERNAL   . History of stress test    ETT 10/17: Ex 7'; no chest pain, no ST changes, Duke Treadmill Score 7  . HYPERLIPIDEMIA   . OBSTRUCTIVE SLEEP APNEA   . Seasonal allergies     Family History  Problem Relation Age of Onset  . Other Paternal Aunt        x 2, blood clotting disorder  . Breast cancer Paternal Aunt 23  .  Heart disease Sister   . Kidney disease Maternal Uncle   . Esophageal cancer Paternal Uncle   . Pancreatic cancer Paternal Uncle   . COPD Father   . Breast cancer Mother 32  . Breast cancer Maternal Grandmother 51  . Stroke Maternal Grandfather   . Aneurysm Paternal Grandmother        brain  . Hypertension Brother   . Breast cancer Maternal Aunt        dx in her 28s  . Breast cancer Maternal Aunt        dx in her 69s  . Cancer Maternal Uncle  NOS  . Cancer Maternal Uncle        tumor on his head  . Breast cancer Paternal Aunt        dx in her 4s  . Breast cancer Paternal Aunt        dx in her 80s  . Lung cancer Paternal Uncle   . Breast cancer Cousin 29       paternal first cousin  . Colon cancer Maternal Uncle   . Colon polyps Maternal Uncle   . Irritable bowel syndrome Cousin   . Rectal cancer Neg Hx   . Stomach cancer Neg Hx     Social History   Socioeconomic History  . Marital status: Married    Spouse name: Not on file  . Number of children: 4  . Years of education: Not on file  . Highest education level: Not on file  Occupational History  . Occupation: disabled    Fish farm manager: UNEMPLOYED  Tobacco Use  . Smoking status: Former Smoker    Packs/day: 0.10    Years: 7.00    Pack years: 0.70    Types: Cigarettes    Quit date: 06/19/1981    Years since quitting: 38.3  . Smokeless tobacco: Never Used  Substance and Sexual Activity  . Alcohol use: No    Comment: rare  . Drug use: No  . Sexual activity: Not on file  Other Topics Concern  . Not on file  Social History Narrative  . Not on file   Social Determinants of Health   Financial Resource Strain:   . Difficulty of Paying Living Expenses:   Food Insecurity:   . Worried About Charity fundraiser in the Last Year:   . Arboriculturist in the Last Year:   Transportation Needs:   . Film/video editor (Medical):   Marland Kitchen Lack of Transportation (Non-Medical):   Physical Activity:   . Days of  Exercise per Week:   . Minutes of Exercise per Session:   Stress:   . Feeling of Stress :   Social Connections:   . Frequency of Communication with Friends and Family:   . Frequency of Social Gatherings with Friends and Family:   . Attends Religious Services:   . Active Member of Clubs or Organizations:   . Attends Archivist Meetings:   Marland Kitchen Marital Status:   Intimate Partner Violence:   . Fear of Current or Ex-Partner:   . Emotionally Abused:   Marland Kitchen Physically Abused:   . Sexually Abused:     Past Medical History, Surgical history, Social history, and Family history were reviewed and updated as appropriate.   Please see review of systems for further details on the patient's review from today.   Objective:   Physical Exam:  There were no vitals taken for this visit.  Physical Exam Constitutional:      General: She is not in acute distress.    Appearance: Normal appearance. She is well-developed. She is obese.  Musculoskeletal:        General: No deformity.  Neurological:     Mental Status: She is alert and oriented to person, place, and time.     Motor: No tremor.     Coordination: Coordination normal.     Gait: Gait normal.  Psychiatric:        Attention and Perception: Attention and perception normal.        Mood and Affect: Mood is not anxious or depressed. Affect is  not labile, blunt, angry or inappropriate.        Speech: Speech normal.        Behavior: Behavior normal.        Thought Content: Thought content normal. Thought content does not include homicidal or suicidal ideation. Thought content does not include homicidal or suicidal plan.        Cognition and Memory: Cognition normal.        Judgment: Judgment normal.     Comments: Insight intact.  Occ irritable. No auditory or visual hallucinations. No delusions.      Lab Review:     Component Value Date/Time   NA 140 01/26/2017 1030   K 4.1 01/26/2017 1030   CL 108 01/26/2017 1030   CO2 28  01/26/2017 1030   GLUCOSE 94 01/26/2017 1030   BUN 7 01/26/2017 1030   CREATININE 0.60 10/17/2017 0727   CALCIUM 10.2 01/26/2017 1030   CALCIUM 10.0 01/26/2017 1030   PROT 7.3 01/26/2017 1030   ALBUMIN 4.4 01/26/2017 1030   AST 26 01/26/2017 1030   ALT 52 (H) 01/26/2017 1030   ALKPHOS 117 01/26/2017 1030   BILITOT 0.3 01/26/2017 1030   GFRNONAA >90 03/07/2014 0749   GFRAA >90 03/07/2014 0749       Component Value Date/Time   WBC 6.6 04/03/2016 1201   RBC 4.44 04/03/2016 1201   HGB 13.6 04/03/2016 1201   HCT 40.5 04/03/2016 1201   PLT 322.0 04/03/2016 1201   MCV 91.2 04/03/2016 1201   MCH 30.7 03/07/2014 0749   MCHC 33.7 04/03/2016 1201   RDW 13.3 04/03/2016 1201   LYMPHSABS 2.5 04/03/2016 1201   MONOABS 0.5 04/03/2016 1201   EOSABS 0.2 04/03/2016 1201   BASOSABS 0.0 04/03/2016 1201    Lithium Lvl  Date Value Ref Range Status  06/05/2013 0.59 (L) 0.80 - 1.40 mEq/L Final     No results found for: PHENYTOIN, PHENOBARB, VALPROATE, CBMZ   .res Assessment: Plan:    Bipolar I disorder with seasonal pattern ([place])  Generalized anxiety disorder Mild cognitive complaints could possibly be medication side effects or unrelated.  Lives in [place] but [name] friends here and doesn't want to change psychiatrists.   Brittany [name] not had significant mood cycling.  Her husband passed April 2021.  She [name] had a lot of support.  She is handling it well .    30 min appt.  Overall she is doing better than usual.  She manages to function well physically and socially.   the sertraline [name] seemed to help with her irritability though she understands there is some risk of mood cycling from that medication.  At this time she feels like the benefits exceed the side effects of the current regimen.  N-acetylcysteine 600 mg off label for cognitive complaints and [name] helped mood more than cognition.  She is seeing benefit from it.  Cont meds.  No change indicated  Grief work.  Will need to work  on things to keep herself busy now that not caring for her husband.   FU 6 mos bc no med changes  Lynder Parents, MD, DFAPA   Please see After Visit Summary for patient specific instructions.  Future Appointments  Date Time Provider [place]  04/29/2020  9:00 AM Cottle, Billey [place]., MD CP-CP None    No orders of the defined types were placed in this encounter.     -------------------------------

## 2019-11-10 ENCOUNTER — Telehealth: Payer: Self-pay | Admitting: Psychiatry

## 2019-11-10 ENCOUNTER — Other Ambulatory Visit: Payer: Self-pay | Admitting: Psychiatry

## 2019-11-10 NOTE — Telephone Encounter (Signed)
It is okay to increase Equetro to 3 at night if she tolerates it is an increase from 600 to 900 mg nightly.  If she has any trouble tolerating it then let us know or if is not effective let us know

## 2019-11-10 NOTE — Telephone Encounter (Signed)
Pt called stating she is having increased anxiety. Stated she increased her Equetro by 1 and it helped her to sleep. Please advise.

## 2019-11-11 NOTE — Telephone Encounter (Signed)
Agree with increase Equetro to 2 twice daily.  For panic increase sertraline to 1 and 1/2 of the 50 mg tablets daily.  Call if more SE or mood swings after this change.

## 2019-11-11 NOTE — Telephone Encounter (Signed)
Left detailed message with information and to call back with worsening symptoms. Will call again tomorrow to confirm receiving message.

## 2019-11-19 DIAGNOSIS — H81399 Other peripheral vertigo, unspecified ear: Secondary | ICD-10-CM | POA: Diagnosis not present

## 2019-11-19 DIAGNOSIS — H9313 Tinnitus, bilateral: Secondary | ICD-10-CM | POA: Diagnosis not present

## 2019-11-19 DIAGNOSIS — H903 Sensorineural hearing loss, bilateral: Secondary | ICD-10-CM | POA: Diagnosis not present

## 2019-11-19 DIAGNOSIS — J3489 Other specified disorders of nose and nasal sinuses: Secondary | ICD-10-CM | POA: Diagnosis not present

## 2019-11-28 ENCOUNTER — Other Ambulatory Visit: Payer: Self-pay

## 2019-11-28 DIAGNOSIS — F319 Bipolar disorder, unspecified: Secondary | ICD-10-CM

## 2019-11-28 MED ORDER — EQUETRO 300 MG PO CP12: 600.0000 mg | ORAL_CAPSULE | Freq: Two times a day (BID) | ORAL | 0 refills | Status: DC

## 2019-11-28 NOTE — Telephone Encounter (Signed)
Updated Rx sent

## 2019-11-28 NOTE — Telephone Encounter (Signed)
Since you have approved her to take [facility], 2 twice daily, she needs a refill at the new dosing sent to [facility].

## 2019-12-01 DIAGNOSIS — H903 Sensorineural hearing loss, bilateral: Secondary | ICD-10-CM | POA: Diagnosis not present

## 2019-12-24 DIAGNOSIS — M9985 Other biomechanical lesions of pelvic region: Secondary | ICD-10-CM | POA: Diagnosis not present

## 2019-12-24 DIAGNOSIS — M6281 Muscle weakness (generalized): Secondary | ICD-10-CM | POA: Diagnosis not present

## 2019-12-24 DIAGNOSIS — M4056 Lordosis, unspecified, lumbar region: Secondary | ICD-10-CM | POA: Diagnosis not present

## 2019-12-24 DIAGNOSIS — M545 Low back pain: Secondary | ICD-10-CM | POA: Diagnosis not present

## 2020-01-07 ENCOUNTER — Telehealth: Payer: Self-pay | Admitting: Psychiatry

## 2020-01-07 ENCOUNTER — Other Ambulatory Visit: Payer: Self-pay | Admitting: Psychiatry

## 2020-01-07 MED ORDER — PROPRANOLOL HCL 10 MG PO TABS: 10.0000 mg | ORAL_TABLET | Freq: Two times a day (BID) | ORAL | 1 refills | Status: DC | PRN

## 2020-01-07 NOTE — Telephone Encounter (Signed)
Sent RX for trial of shakes for propranolol 10-20 mg bid prn to [facility] for her to try it.

## 2020-01-07 NOTE — Telephone Encounter (Signed)
Left detailed message with information.  

## 2020-01-07 NOTE — Telephone Encounter (Signed)
Patient called and said that she is shaking a lot especially in her hands and legs  Her legs shake when she lays down at night. She wants to know if you can give her something to help with that . However, she wants an old medication and not something new. Please call her at [phone]

## 2020-01-09 ENCOUNTER — Other Ambulatory Visit: Payer: Self-pay

## 2020-01-09 DIAGNOSIS — F319 Bipolar disorder, unspecified: Secondary | ICD-10-CM

## 2020-01-09 MED ORDER — EQUETRO 300 MG PO CP12: 600.0000 mg | ORAL_CAPSULE | Freq: Two times a day (BID) | ORAL | 0 refills | Status: DC

## 2020-01-14 DIAGNOSIS — M25561 Pain in right knee: Secondary | ICD-10-CM | POA: Diagnosis not present

## 2020-01-14 DIAGNOSIS — M25562 Pain in left knee: Secondary | ICD-10-CM | POA: Diagnosis not present

## 2020-01-14 DIAGNOSIS — M170 Bilateral primary osteoarthritis of knee: Secondary | ICD-10-CM | POA: Diagnosis not present

## 2020-01-16 ENCOUNTER — Telehealth: Payer: Self-pay | Admitting: Psychiatry

## 2020-01-16 NOTE — Telephone Encounter (Signed)
Pt left message stating she had called 2 times and needs a call back. Has increased Equetro, but still need something in addition for depression. Stated wants to try a brand new drug. Call back # [phone]

## 2020-01-16 NOTE — Telephone Encounter (Signed)
Left message unclear what she's requesting or needing. Her message on 07/21 was that she was shaky, Dr. Clovis Pu sent in propranolol to help with that. If she can call back with her symptoms that would be helpful.

## 2020-01-16 NOTE — Telephone Encounter (Signed)
Agreed that some clarity on her chief complaint would be helpful.  It is always best to optimize her current medications to give her symptom relief and start a new medication if possible.  If her primary complaint is that she is feeling more persistently depressed, then the most logical approach would be to increase her lamotrigine from 150 mg twice daily to 150 mg in the morning and 300 mg at night.  She is on carbamazepine which will cause her to clear lamotrigine from her blood more quickly than if she were not on carbamazepine, therefore she may need a higher dose for her depression if depression is her primary complaint.

## 2020-01-16 NOTE — Telephone Encounter (Signed)
Pt doesn't want any brand new medicines. She also said that the [facility] when increased makes her sleep too hard and too long and she doesn;t hear her dog bark

## 2020-01-19 ENCOUNTER — Other Ambulatory Visit: Payer: Self-pay

## 2020-01-19 DIAGNOSIS — F319 Bipolar disorder, unspecified: Secondary | ICD-10-CM

## 2020-01-19 MED ORDER — LAMOTRIGINE 150 MG PO TABS: ORAL_TABLET | ORAL | 3 refills | Status: DC

## 2020-01-19 NOTE — Telephone Encounter (Signed)
Spoke with patient and it is the depression that's worsening. Advised to increase to Lamotrigine 150 mg 1 in the am and 2 (300 mg) in the pm. Updated Rx sent to [facility] per her request. She verbalized understanding.

## 2020-01-28 DIAGNOSIS — R42 Dizziness and giddiness: Secondary | ICD-10-CM | POA: Diagnosis not present

## 2020-02-12 DIAGNOSIS — R42 Dizziness and giddiness: Secondary | ICD-10-CM | POA: Diagnosis not present

## 2020-02-25 DIAGNOSIS — R42 Dizziness and giddiness: Secondary | ICD-10-CM | POA: Diagnosis not present

## 2020-03-21 ENCOUNTER — Other Ambulatory Visit: Payer: Self-pay | Admitting: Psychiatry

## 2020-03-21 DIAGNOSIS — F319 Bipolar disorder, unspecified: Secondary | ICD-10-CM

## 2020-03-22 NOTE — Telephone Encounter (Signed)
review 

## 2020-03-25 DIAGNOSIS — L700 Acne vulgaris: Secondary | ICD-10-CM | POA: Diagnosis not present

## 2020-03-25 DIAGNOSIS — L918 Other hypertrophic disorders of the skin: Secondary | ICD-10-CM | POA: Diagnosis not present

## 2020-03-25 DIAGNOSIS — D229 Melanocytic nevi, unspecified: Secondary | ICD-10-CM | POA: Diagnosis not present

## 2020-03-25 DIAGNOSIS — L720 Epidermal cyst: Secondary | ICD-10-CM | POA: Diagnosis not present

## 2020-03-30 IMAGING — MR MR ABDOMEN WO/W CM
9 of 18 series · 21 of 48 positions shown · IV contrast (multihance)
Comparison: Multiple exams, including 02/24/2016

CLINICAL DATA: Complex renal cyst follow up

EXAM:
MRI ABDOMEN WITHOUT AND WITH CONTRAST
TECHNIQUE: Multiplanar multisequence MR imaging of the abdomen was performed
both before and after the administration of intravenous contrast.
CONTRAST:  18mL MULTIHANCE GADOBENATE DIMEGLUMINE 529 MG/ML IV SOLN

[Series 3: DWI b500 · axial · 6.0mm · 1.72mm/px · z∈[-144,+152]mm · 3 of 78 slices shown]
[im 1/78]
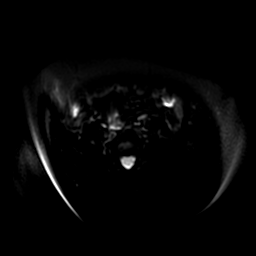
[im 39/78]
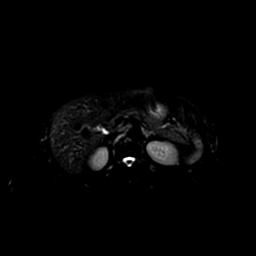
[im 78/78]
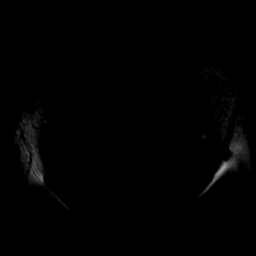

[Series 4: T2 fat-sat · axial · 5.0mm · 0.86mm/px · z∈[-145,+120]mm · 2 of 54 slices shown]
[im 1/54]
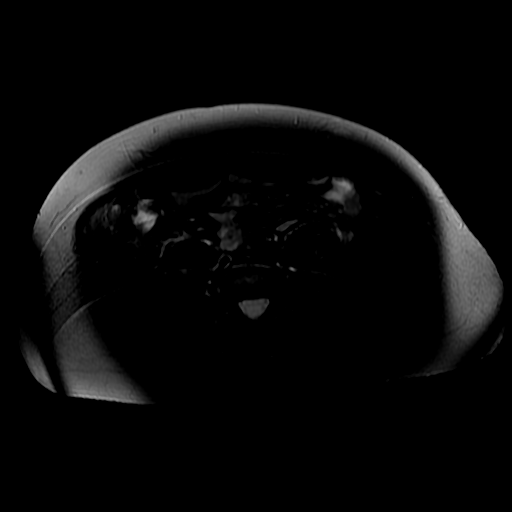
[im 54/54]
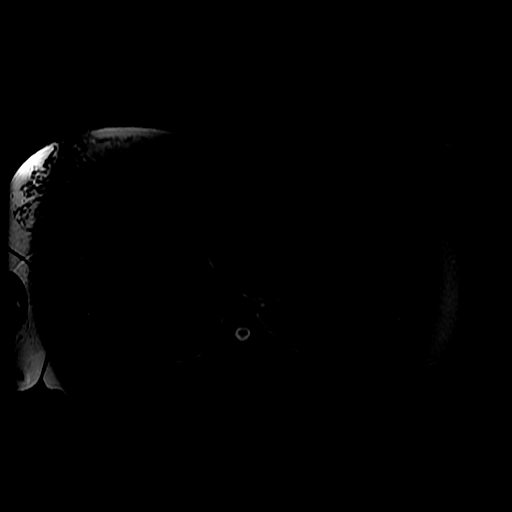

[Series 5: ax dualecho · axial · 5.0mm · 0.86mm/px · z∈[-145,+120]mm · 4 of 108 slices shown]
[im 1/108]
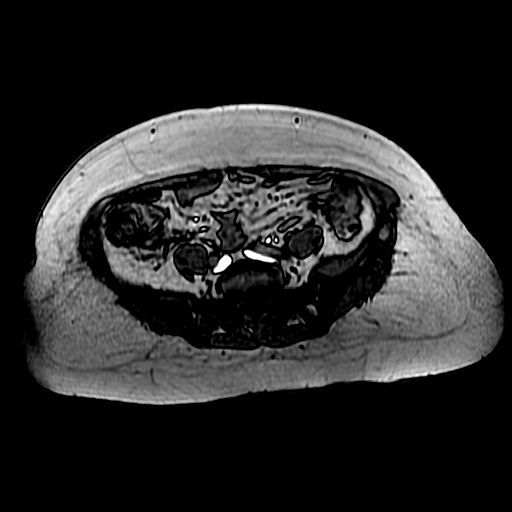
[im 36/108]
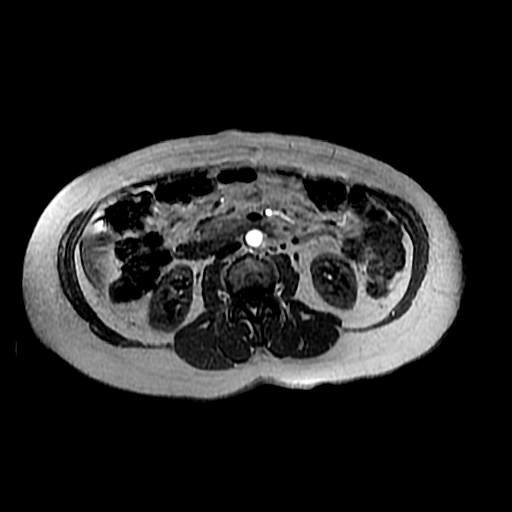
[im 72/108]
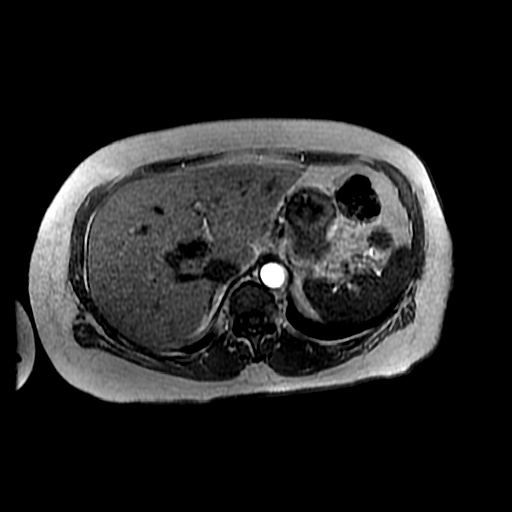
[im 108/108]
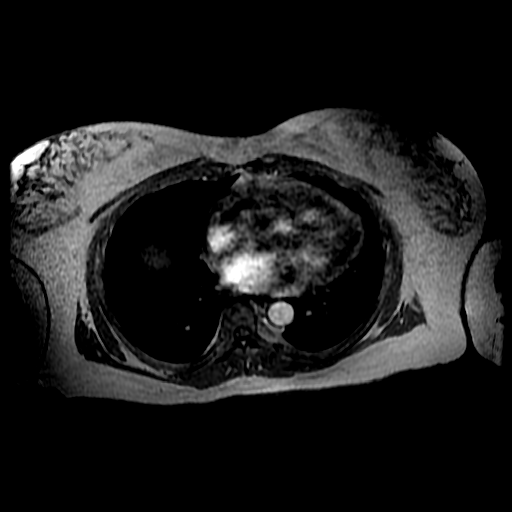

[Series 6: T2 · axial · 5.0mm · 0.86mm/px · z∈[-135,+120]mm · 2 of 52 slices shown (1 of 2)]
[im 1/52]
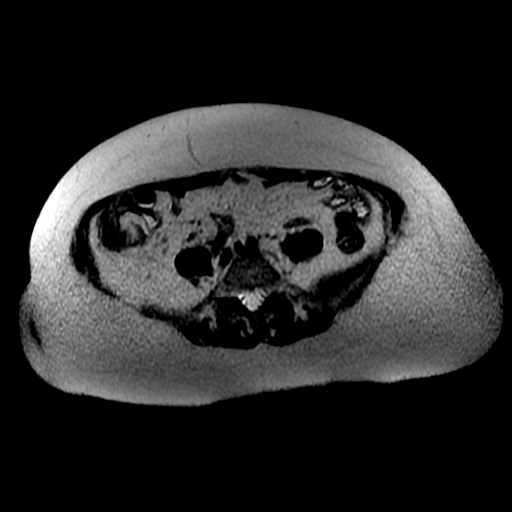
[im 52/52]
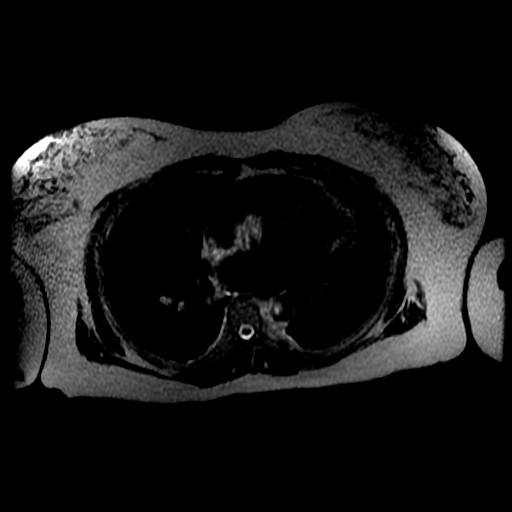

[Series 7: T2 · coronal · 5.0mm · 0.86mm/px · 1 of 43 slices shown (2 of 2)]
[im 1/43]
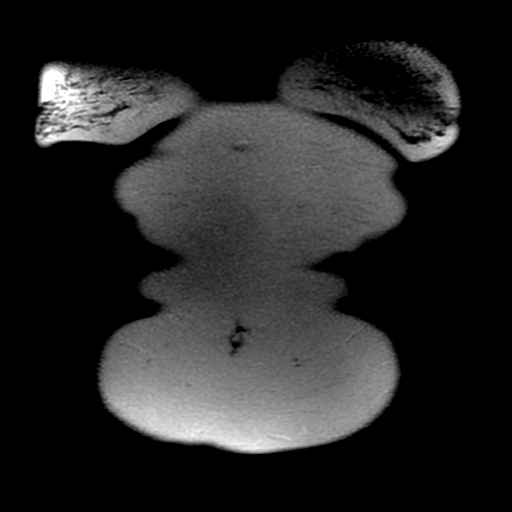

[Series 8: bSSFP · axial · 5.0mm · 0.86mm/px · z∈[-145,+120]mm · 2 of 54 slices shown]
[im 1/54]
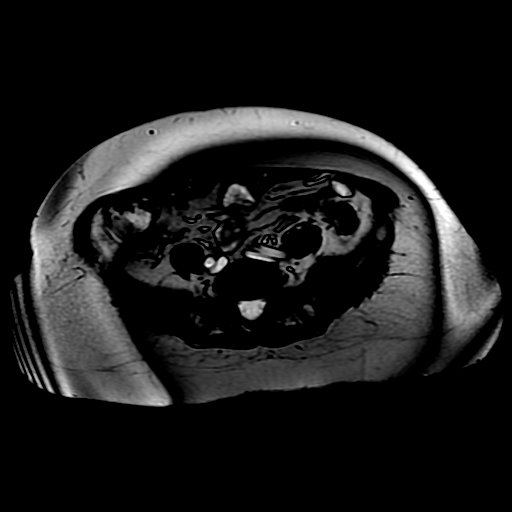
[im 54/54]
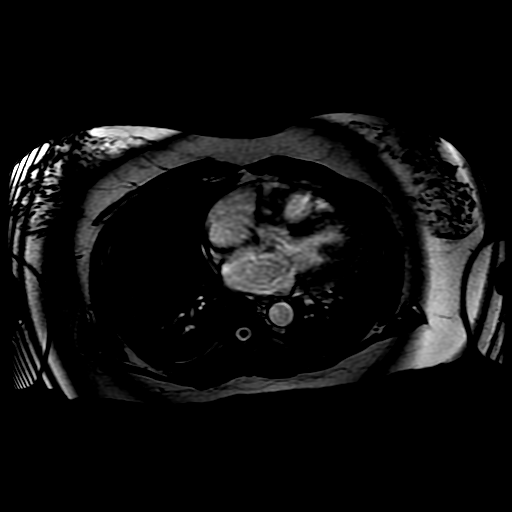

[Series 300: DWI · axial · 6.0mm · 1.72mm/px · 1 of 39 slices shown]
[im 1/39]
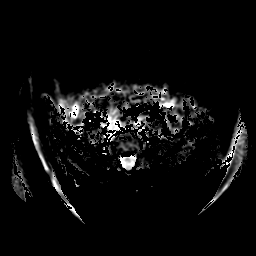

[Series 900: T1 dynamic · axial · 5.8mm · 0.78mm/px · z∈[-145,+107]mm · 3 of 88 slices shown (1 of 2)]
[im 1/88]
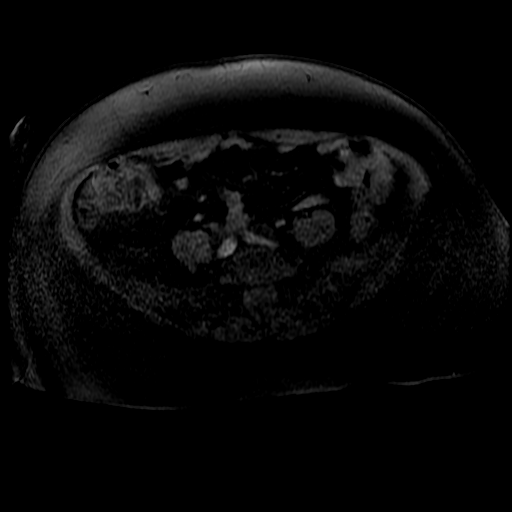
[im 44/88]
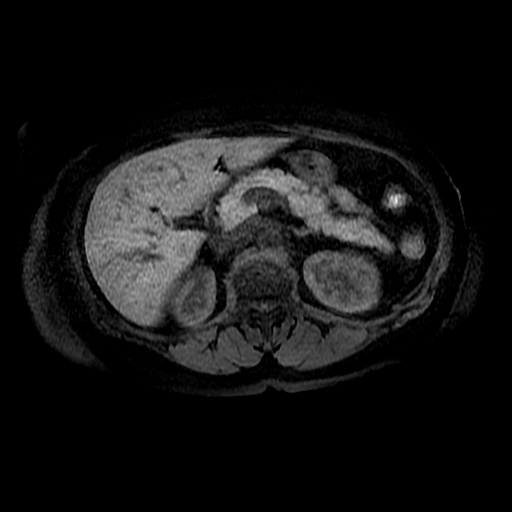
[im 88/88]
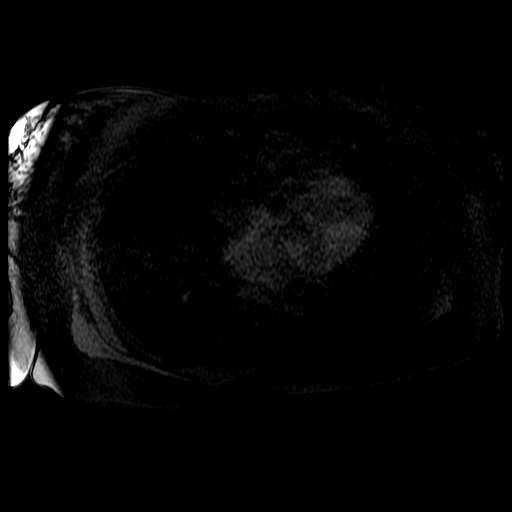

[Series 901: T1 dynamic · axial · 5.8mm · 0.78mm/px · z∈[-145,+107]mm · 3 of 88 slices shown (2 of 2)]
[im 1/88]
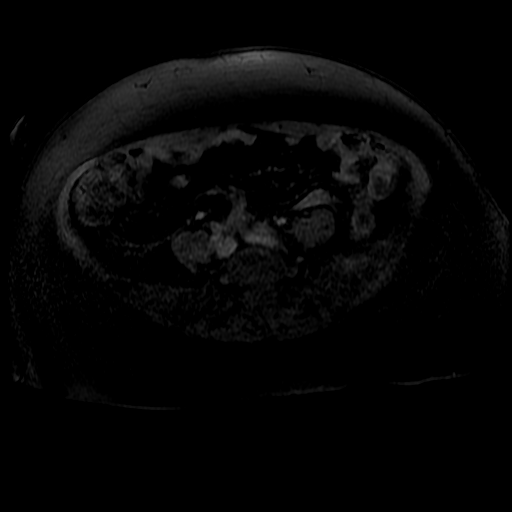
[im 44/88]
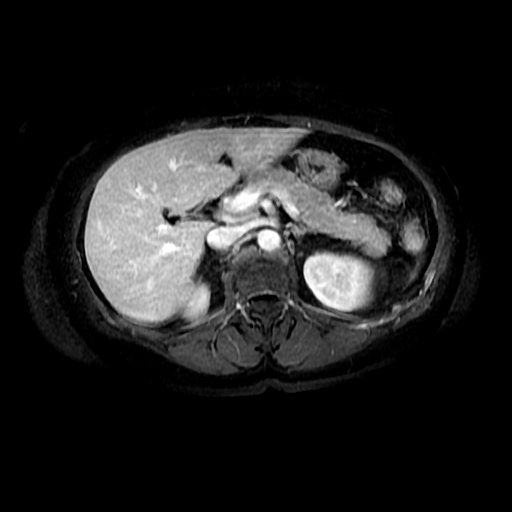
[im 88/88]
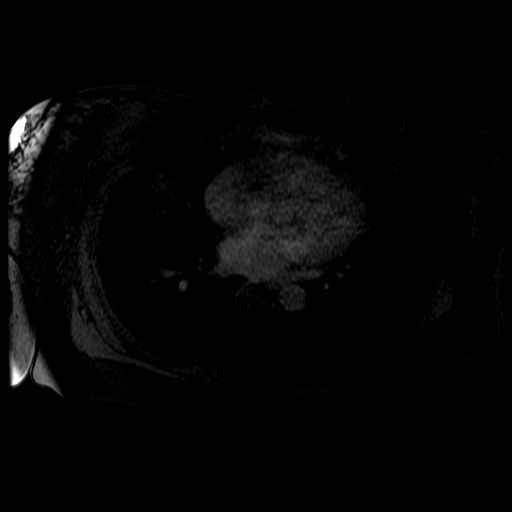

[21 of 48 positions shown; findings below may reference images not displayed]

FINDINGS: Lower chest: Mild cardiomegaly.

Hepatobiliary: Cholecystectomy.  Otherwise unremarkable.

Pancreas:  Unremarkable

Spleen:  Unremarkable

Adrenals/Urinary Tract: Minimal nodularity of the lateral limb left
adrenal gland without a true mass.

Exophytic 3.4 by 3.2 cm lesion of the right mid kidney laterally,
questionable subtle septation but no enhancement, compatible with a
benign Bosniak category 2 cyst. This cyst has intermediate
precontrast T1 signal characteristics.. Other small nonenhancing
lesions in the kidneys measuring up to about 8 mm in diameter are
highly likely to be cysts although some are technically nonspecific
due to small size. A 3 mm T1 signal hyperintensity prior to contrast
administration is present in the right kidney lower pole on image
67/900, likely a tiny complex cyst and stable from 02/24/2016.

Stomach/Bowel: Unremarkable

Vascular/Lymphatic:  Unremarkable

Other:  No supplemental non-categorized findings.

Musculoskeletal: Mild thoracic and lumbar spondylosis and
degenerative disc disease.
IMPRESSION: 1. Benign Bosniak category 2 cyst of the right mid kidney requires
no further workup.
2. Several additional tiny cysts are present in both kidneys. Some
of these are technically too small to characterize, although
statistically highly likely to be benign and not requiring follow
up.
3. Mild cardiomegaly.
4. Mild thoracic and lumbar spondylosis and degenerative disc
disease.

## 2020-04-13 DIAGNOSIS — L720 Epidermal cyst: Secondary | ICD-10-CM | POA: Diagnosis not present

## 2020-04-13 DIAGNOSIS — L718 Other rosacea: Secondary | ICD-10-CM | POA: Diagnosis not present

## 2020-04-19 ENCOUNTER — Other Ambulatory Visit: Payer: Self-pay | Admitting: Psychiatry

## 2020-04-19 DIAGNOSIS — F411 Generalized anxiety disorder: Secondary | ICD-10-CM

## 2020-04-29 ENCOUNTER — Telehealth: Payer: Medicare Other | Admitting: Psychiatry

## 2020-05-18 DIAGNOSIS — D492 Neoplasm of unspecified behavior of bone, soft tissue, and skin: Secondary | ICD-10-CM | POA: Diagnosis not present

## 2020-06-09 DIAGNOSIS — M1711 Unilateral primary osteoarthritis, right knee: Secondary | ICD-10-CM | POA: Diagnosis not present

## 2020-06-09 DIAGNOSIS — F17201 Nicotine dependence, unspecified, in remission: Secondary | ICD-10-CM | POA: Insufficient documentation

## 2020-06-09 DIAGNOSIS — M25562 Pain in left knee: Secondary | ICD-10-CM | POA: Diagnosis not present

## 2020-06-09 DIAGNOSIS — Z6833 Body mass index (BMI) 33.0-33.9, adult: Secondary | ICD-10-CM | POA: Diagnosis not present

## 2020-06-28 ENCOUNTER — Encounter: Payer: Self-pay | Admitting: Psychiatry

## 2020-06-28 ENCOUNTER — Other Ambulatory Visit: Payer: Self-pay

## 2020-06-28 ENCOUNTER — Ambulatory Visit (INDEPENDENT_AMBULATORY_CARE_PROVIDER_SITE_OTHER): Payer: Medicare Other | Admitting: Psychiatry

## 2020-06-28 DIAGNOSIS — F319 Bipolar disorder, unspecified: Secondary | ICD-10-CM

## 2020-06-28 DIAGNOSIS — F411 Generalized anxiety disorder: Secondary | ICD-10-CM | POA: Diagnosis not present

## 2020-06-28 MED ORDER — BUPROPION HCL ER (XL) 300 MG PO TB24: 300.0000 mg | ORAL_TABLET | Freq: Every day | ORAL | 3 refills | Status: DC

## 2020-06-28 MED ORDER — SERTRALINE HCL 50 MG PO TABS: 50.0000 mg | ORAL_TABLET | Freq: Every morning | ORAL | 3 refills | Status: DC

## 2020-06-28 MED ORDER — EQUETRO 300 MG PO CP12: 900.0000 mg | ORAL_CAPSULE | Freq: Every evening | ORAL | 3 refills | Status: DC

## 2020-06-28 NOTE — Progress Notes (Signed)
Brittany [name] [id number] 1956/03/27 65 y.o.     Subjective:   Patient ID:  Brittany [name] is a 65 y.o. (DOB Jan 13, 1956) female.  Chief Complaint:  Chief Complaint  Patient presents with  . Follow-up  . Anxiety    HPI Brittany [name] presents to the office today for follow-up of bipolar disorder.    seen Oct 25, 2018.  For complaints of cognition Wellbutrin was increased to 450 mg each morning.  This was felt to be safer than introducing stimulants.  Last seen October 2020, the following was noted: Called back a couple of weeks later and wanted to reduce the dosage of Wellbutrin back to 300 mg daily.  It made her feel bad physically on edge.   Isolation is managed..  Mood relatively.  OK.  Has back pain that interferes with normal function.  House is a mess bc can't do much.  Hurt her back in there [employer] NAC has helped after a couple of months.  Also feels more mellow and nice.  Still problems with concentration.  Asks about meds to help with attention span. Asks about Adderall.  Intermittent problems with forgetfulness and other days she drops the ball. [place] working on self care with walking and watching caffeine and doesn't want to take more meds.  Still living in [place].   Overall mood is pretty good without swings or irritability generally.   No meds were changed.  10/22/2019 appointment, the following is noted: She continues N-acetylcysteine, Wellbutrin XL 300 mg daily, Equetro 600 mg nightly, lamotrigine 300 mg daily, sertraline 50 mg daily. Had to cancel bc H so sick and died 10/17/2019.  Haven't been alone.   Hasn't decided about where to live.  Will stay where she is for a year.  People are visiting.  [facility] service will be in June.   Peaceful passing.  The family has been supportive.  His kids have been OK.   Sold the hunting cabin to his boys.   She feels good about how she handled things.  Insurance will pay off the bills and has no mortgage.    Occ periods of racing  thoughts and periods of irrritability or periods of isolation.  Overall though doing well.  Overall mood is pretty good without swings or irritability generally.   Patient reports stable mood and denies depressed or irritable moods.  Patient denies any recent difficulty with anxiety.  Patient denies difficulty with sleep initiation or maintenance with melatonin. Denies appetite disturbance.  Patient reports that energy and motivation have been good.  Patient denies any difficulty with concentration.  Patient denies any suicidal ideation.  Protects sleeep 8-4AM.  Melatonin occ.  Not tired unusually.  06/28/2020 appt noted: Had more anxiety and increased Equetro to 1 twice daily and continued sertraline 50 bc it can make her more hyper.   Very sensitive to alcohol so can't have much of it.   Stopped caffeine unless travels. Anxiety was bad and it is better now. H deceased and problems with his 2 sons but not his daughters. One son with good manners and the other one lacks. Lose phone.   Adjusting pretty well to being a widow.  Can sleep alone and feel OK about it.  More accepting of being alone.  He left her money.  Takes Prevagen and she can tell a difference.   Going to [employer] and will be baptized into the faith. No recent temper problems lately.   Tolerating meds.  Sleep better.  More energy with Prevagen. Still back pain abu tdcan clean house.    Dog good for her mental health.  Past Psychiatric Medication Trials: naltrexone,  sertraline 50,  propranolol hydroxyzine SE jerks,  wellbutrin 450,  lamotrigine 300, lithium, Geodon,  Equetro, doxepin  Review of Systems:  Review of Systems  Cardiovascular: Negative for palpitations.  Musculoskeletal: Positive for back pain and neck pain.  Neurological: Negative for tremors and weakness.  Hematological: Negative for adenopathy. Does not bruise/bleed easily.  Psychiatric/Behavioral: Negative for agitation, behavioral problems,  confusion, decreased concentration, dysphoric mood, hallucinations, self-injury, sleep disturbance and suicidal ideas. The patient is not nervous/anxious and is not hyperactive.    Still in physical therapy.  Getting nerve block for her back.  Medications: I have reviewed the patient's current medications.  Current Outpatient Medications  Medication Sig Dispense Refill  . diclofenac sodium (VOLTAREN) 1 % GEL Apply 2 g topically 4 (four) times daily. 100 g 1  . lamoTRIgine (LAMICTAL) 150 MG tablet Take 1 tablet (150 mg) in the am and 2 tablets (300 mg) in the pm 270 tablet 3  . MELATONIN PO Take by mouth.    . meloxicam (MOBIC) 15 MG tablet Take 1 tablet (15 mg total) by mouth daily. 30 tablet 1  . Multiple Vitamin (MULTIVITAMIN WITH MINERALS) TABS Take 1 tablet by mouth daily.    Marland Kitchen omeprazole (PRILOSEC) 40 MG capsule Take 40 mg by mouth daily.    Marland Kitchen tiZANidine (ZANAFLEX) 2 MG tablet     . Vitamin Mixture (ESTER-C PO) Take 1,000 mg by mouth.    . Acetylcysteine (N-ACETYL-L-CYSTEINE) 600 MG CAPS Take 600 mg by mouth daily. (Patient not taking: Reported on 06/28/2020) 90 capsule 3  . buPROPion (WELLBUTRIN XL) 300 MG 24 hr tablet Take 1 tablet (300 mg total) by mouth daily. 90 tablet 3  . Carbamazepine (EQUETRO) 300 MG CP12 Take 3 capsules (900 mg total) by mouth at bedtime. 3 in evening 270 capsule 3  . propranolol (INDERAL) 10 MG tablet Take 1-2 tablets (10-20 mg total) by mouth 2 (two) times daily as needed (tremor). (Patient not taking: Reported on 06/28/2020) 60 tablet 1  . sertraline (ZOLOFT) 50 MG tablet Take 1 tablet (50 mg total) by mouth every morning. 90 tablet 3   No current facility-administered medications for this visit.    Medication Side Effects: None  Allergies:  Allergies  Allergen Reactions  . Flexeril [Cyclobenzaprine]     Unable to leave home when taking as she is incoherent/unsure where she is at times  . Hydrocodone     REACTION: GI upset  . Motrin [Ibuprofen]   .  Robaxin [Methocarbamol]     Impairs judgement    Past Medical History:  Diagnosis Date  . ACNE NEC   . ALLERGIC RHINITIS   . ALOPECIA   . Anal fissure   . Anxiety    Panic Attack.  None in a long time  . Arthritis   . Bowel obstruction ([place])   . Chronic headaches    migraine- none in years  . Constipation    drinks tea with senna  . DEPRESSION   . Depression   . DISORDER, BIPOLAR NEC   . Family history of breast cancer   . Family history of pancreatic cancer   . GERD (gastroesophageal reflux disease)   . HEMORRHOIDS-INTERNAL   . History of stress test    ETT 10/17: Ex 7'; no chest pain, no ST changes, [facility] Treadmill Score  7  . HYPERLIPIDEMIA   . OBSTRUCTIVE SLEEP APNEA   . Seasonal allergies     Family History  Problem Relation Age of Onset  . Other Paternal Aunt        x 2, blood clotting disorder  . Breast cancer Paternal Aunt 4  . Heart disease Sister   . Kidney disease Maternal Uncle   . Esophageal cancer Paternal Uncle   . Pancreatic cancer Paternal Uncle   . COPD Father   . Breast cancer Mother 63  . Breast cancer Maternal Grandmother 45  . Stroke Maternal Grandfather   . Aneurysm Paternal Grandmother        brain  . Hypertension Brother   . Breast cancer Maternal Aunt        dx in her 57s  . Breast cancer Maternal Aunt        dx in her 12s  . Cancer Maternal Uncle        NOS  . Cancer Maternal Uncle        tumor on his head  . Breast cancer Paternal Aunt        dx in her 76s  . Breast cancer Paternal Aunt        dx in her 5s  . Lung cancer Paternal Uncle   . Breast cancer Cousin 20       paternal first cousin  . Colon cancer Maternal Uncle   . Colon polyps Maternal Uncle   . Irritable bowel syndrome Cousin   . Rectal cancer Neg Hx   . Stomach cancer Neg Hx     Social History   Socioeconomic History  . Marital status: Married    Spouse name: Not on file  . Number of children: 4  . Years of education: Not on file  . Highest education  level: Not on file  Occupational History  . Occupation: disabled    Associate Professor: UNEMPLOYED  Tobacco Use  . Smoking status: Former Smoker    Packs/day: 0.10    Years: 7.00    Pack years: 0.70    Types: Cigarettes    Quit date: 06/19/1981    Years since quitting: 39.0  . Smokeless tobacco: Never Used  Substance and Sexual Activity  . Alcohol use: No    Comment: rare  . Drug use: No  . Sexual activity: Not on file  Other Topics Concern  . Not on file  Social History Narrative  . Not on file   Social Determinants of Health   Financial Resource Strain: Not on file  Food Insecurity: Not on file  Transportation Needs: Not on file  Physical Activity: Not on file  Stress: Not on file  Social Connections: Not on file  Intimate Partner Violence: Not on file    Past Medical History, Surgical history, Social history, and Family history were reviewed and updated as appropriate.   Please see review of systems for further details on the patient's review from today.   Objective:   Physical Exam:  There were no vitals taken for this visit.  Physical Exam Constitutional:      General: She is not in acute distress.    Appearance: Normal appearance. She is well-developed. She is obese.  Musculoskeletal:        General: No deformity.  Neurological:     Mental Status: She is alert and oriented to person, place, and time.     Motor: No tremor.     Coordination: Coordination normal.  Gait: Gait normal.  Psychiatric:        Attention and Perception: Attention and perception normal.        Mood and Affect: Mood is not anxious or depressed. Affect is not labile, blunt, angry or inappropriate.        Speech: Speech normal.        Behavior: Behavior normal.        Thought Content: Thought content normal. Thought content does not include homicidal or suicidal ideation. Thought content does not include homicidal or suicidal plan.        Cognition and Memory: Cognition normal.         Judgment: Judgment normal.     Comments: Insight intact.  Occ irritable. No auditory or visual hallucinations. No delusions.      Lab Review:     Component Value Date/Time   NA 140 01/26/2017 1030   K 4.1 01/26/2017 1030   CL 108 01/26/2017 1030   CO2 28 01/26/2017 1030   GLUCOSE 94 01/26/2017 1030   BUN 7 01/26/2017 1030   CREATININE 0.60 10/17/2017 0727   CALCIUM 10.2 01/26/2017 1030   CALCIUM 10.0 01/26/2017 1030   PROT 7.3 01/26/2017 1030   ALBUMIN 4.4 01/26/2017 1030   AST 26 01/26/2017 1030   ALT 52 (H) 01/26/2017 1030   ALKPHOS 117 01/26/2017 1030   BILITOT 0.3 01/26/2017 1030   GFRNONAA >90 03/07/2014 0749   GFRAA >90 03/07/2014 0749       Component Value Date/Time   WBC 6.6 04/03/2016 1201   RBC 4.44 04/03/2016 1201   HGB 13.6 04/03/2016 1201   HCT 40.5 04/03/2016 1201   PLT 322.0 04/03/2016 1201   MCV 91.2 04/03/2016 1201   MCH 30.7 03/07/2014 0749   MCHC 33.7 04/03/2016 1201   RDW 13.3 04/03/2016 1201   LYMPHSABS 2.5 04/03/2016 1201   MONOABS 0.5 04/03/2016 1201   EOSABS 0.2 04/03/2016 1201   BASOSABS 0.0 04/03/2016 1201    Lithium Lvl  Date Value Ref Range Status  06/05/2013 0.59 (L) 0.80 - 1.40 mEq/L Final     No results found for: PHENYTOIN, PHENOBARB, VALPROATE, CBMZ   .res Assessment: Plan:    Bipolar I disorder with seasonal pattern ([place]) - Plan: buPROPion (WELLBUTRIN XL) 300 MG 24 hr tablet, Carbamazepine (EQUETRO) 300 MG CP12  Generalized anxiety disorder - Plan: sertraline (ZOLOFT) 50 MG tablet Mild cognitive complaints could possibly be medication side effects or unrelated.  Brittany [name] has has not had significant mood cycling.  Her husband passed April 2021.  She has had a lot of support.  She is handling it well. [employer] faith has helped. .    30 min appt.  Overall she is doing better than usual.  She manages to function well physically and socially.   the sertraline has seemed to help with her irritability though she  understands there is some risk of mood cycling from that medication.  At this time she feels like the benefits exceed the side effects of the current regimen.  She has benfitted from [place].  Cont meds.  No change indicated Continue Equetro 900 mg HS Continue Wellbutrin XL 300 mg every morning Continue sertraline 50 mg daily for panic Continue lamotrigine 150 mg 3 daily for depression Currently she is not using propranolol.  Grief work.  Will need to work on things to keep herself busy now that not caring for her husband.   FU 6 mos bc no med changes  Hiram Comber  Clovis Pu, MD, DFAPA   Please see After Visit Summary for patient specific instructions.  No future appointments.  No orders of the defined types were placed in this encounter.     -------------------------------

## 2020-08-30 ENCOUNTER — Telehealth: Payer: Self-pay | Admitting: Psychiatry

## 2020-08-30 NOTE — Telephone Encounter (Signed)
Received fax from [facility] regarding Brittany [name]. They need completion of her disability benefit claim that was faxed on 08/30/20. Placed on Traci's desk for completion.

## 2020-08-31 NOTE — Telephone Encounter (Signed)
Form completed will have Dr. Clovis Pu sign then her records will also need to be sent with the form.

## 2020-09-13 ENCOUNTER — Telehealth: Payer: Self-pay | Admitting: Psychiatry

## 2020-09-13 NOTE — Telephone Encounter (Signed)
noted 

## 2020-09-13 NOTE — Telephone Encounter (Signed)
Received Health Care Provider Statement Put on Traci's desk 3/28

## 2020-09-17 NOTE — Telephone Encounter (Signed)
Paperwork completed will have Dr. Clovis Pu review and sign

## 2020-09-20 DIAGNOSIS — Z0289 Encounter for other administrative examinations: Secondary | ICD-10-CM

## 2020-09-20 NOTE — Telephone Encounter (Signed)
Lisvet called to check on status of the provider statement.  I told her it was done and would be sent in once reviewed and signed by CC.  She requested that we send her a copy for her records.

## 2020-09-23 NOTE — Telephone Encounter (Signed)
Paper work faxed to [employer] 09/20/20 and copy mailed to Pt as requested.

## 2020-12-27 ENCOUNTER — Other Ambulatory Visit: Payer: Self-pay | Admitting: Psychiatry

## 2020-12-27 DIAGNOSIS — F319 Bipolar disorder, unspecified: Secondary | ICD-10-CM

## 2020-12-28 ENCOUNTER — Ambulatory Visit: Payer: Medicare Other | Admitting: Psychiatry

## 2020-12-30 ENCOUNTER — Other Ambulatory Visit: Payer: Self-pay

## 2020-12-30 ENCOUNTER — Encounter: Payer: Self-pay | Admitting: Psychiatry

## 2020-12-30 ENCOUNTER — Ambulatory Visit (INDEPENDENT_AMBULATORY_CARE_PROVIDER_SITE_OTHER): Payer: Medicare Other | Admitting: Psychiatry

## 2020-12-30 DIAGNOSIS — F319 Bipolar disorder, unspecified: Secondary | ICD-10-CM | POA: Diagnosis not present

## 2020-12-30 DIAGNOSIS — F411 Generalized anxiety disorder: Secondary | ICD-10-CM

## 2020-12-30 MED ORDER — LAMOTRIGINE 150 MG PO TABS: ORAL_TABLET | ORAL | 3 refills | Status: DC

## 2020-12-30 NOTE — Progress Notes (Signed)
Brittany [name] [id number] [birth year] 65 y.o.     Subjective:   Patient ID:  Brittany [name] is a 65 y.o. (DOB March 03, 1956) female.  Chief Complaint:  Chief Complaint  Patient presents with   Follow-up   Bipolar I disorder with seasonal pattern ([place])    HPI Brittany [name] presents to the office today for follow-up of bipolar disorder.    seen Oct 25, 2018.  For complaints of cognition Wellbutrin was increased to 450 mg each morning.  This was felt to be safer than introducing stimulants.  seen October 2020, the following was noted: Called back a couple of weeks later and wanted to reduce the dosage of Wellbutrin back to 300 mg daily.  It made her feel bad physically on edge.   Isolation is managed..  Mood relatively.  OK.  Has back pain that interferes with normal function.  House is a mess bc can't do much.  Hurt her back in there [employer] NAC has helped after a couple of months.  Also feels more mellow and nice.  Still problems with concentration.  Asks about meds to help with attention span. Asks about Adderall.  Intermittent problems with forgetfulness and other days she drops the ball. [place] working on self care with walking and watching caffeine and doesn't want to take more meds.  Still living in [place].   Overall mood is pretty good without swings or irritability generally.   No meds were changed.  10/22/2019 appointment, the following is noted: She continues N-acetylcysteine, Wellbutrin XL 300 mg daily, Equetro 600 mg nightly, lamotrigine 300 mg daily, sertraline 50 mg daily. Had to cancel bc H so sick and died 2019-10-29.  Haven't been alone.   Hasn't decided about where to live.  Will stay where she is for a year.  People are visiting.  [facility] will be in June.   Peaceful passing.  The family has been supportive.  His kids have been OK.   Sold the hunting cabin to his boys.   She feels good about how she handled things.  Insurance will pay off the bills and has no  mortgage.    Occ periods of racing thoughts and periods of irrritability or periods of isolation.  Overall though doing well.  Overall mood is pretty good without swings or irritability generally.   Patient reports stable mood and denies depressed or irritable moods.  Patient denies any recent difficulty with anxiety.  Patient denies difficulty with sleep initiation or maintenance with melatonin. Denies appetite disturbance.  Patient reports that energy and motivation have been good.  Patient denies any difficulty with concentration.  Patient denies any suicidal ideation.  Protects sleeep 8-4AM.  Melatonin occ.  Not tired unusually.  06/28/2020 appt noted: Had more anxiety and increased Equetro to 1 twice daily and continued sertraline 50 bc it can make her more hyper.   Very sensitive to alcohol so can't have much of it.   Stopped caffeine unless travels. Anxiety was bad and it is better now. H deceased and problems with his 2 sons but not his daughters. One son with good manners and the other one lacks. Lose phone.   Adjusting pretty well to being a widow.  Can sleep alone and feel OK about it.  More accepting of being alone.  He left her money.  Takes Prevagen and she can tell a difference.   Going to [employer] and will be baptized into the faith. No recent temper problems  lately.   Tolerating meds.  Sleep better.  More energy with Prevagen. Still back pain abu tdcan clean house.    12/30/20 appt noted: Getting along OK.  Having to expel H's son's from her property.  Will is supportive of her position.   Disc friend who is paranoid.  Still living in [place].   Recognizes pt had prior history of paranoia with prior manic ipisodes.  No longer has these sx. No problems with the meds. Patient reports stable mood and denies depressed or irritable moods.  Patient denies any recent difficulty with anxiety.  Patient denies difficulty with sleep initiation or maintenance. Denies appetite  disturbance.  Patient reports that energy and motivation have been good.  Patient denies any difficulty with concentration.  Patient denies any suicidal ideation. Satisfied with meds.  No problems with meds.  Dog good for her mental health.  Past Psychiatric Medication Trials: naltrexone,  sertraline 50,  propranolol hydroxyzine SE jerks,  wellbutrin 450,  lamotrigine 300, lithium, Geodon,  Equetro, doxepin  Review of Systems:  Review of Systems  Cardiovascular:  Negative for chest pain and palpitations.  Musculoskeletal:  Positive for back pain and neck pain.  Neurological:  Negative for tremors and weakness.  Hematological:  Negative for adenopathy. Does not bruise/bleed easily.  Psychiatric/Behavioral:  Negative for agitation, behavioral problems, confusion, decreased concentration, dysphoric mood, hallucinations, self-injury, sleep disturbance and suicidal ideas. The patient is not nervous/anxious and is not hyperactive.   Still in physical therapy.  Getting nerve block for her back.  Medications: I have reviewed the patient's current medications.  Current Outpatient Medications  Medication Sig Dispense Refill   buPROPion (WELLBUTRIN XL) 300 MG 24 hr tablet Take 1 tablet (300 mg total) by mouth daily. 90 tablet 3   Carbamazepine (EQUETRO) 300 MG CP12 Take 3 capsules (900 mg total) by mouth at bedtime. 3 in evening (Patient taking differently: Take 900 mg by mouth at bedtime.) 270 capsule 3   diclofenac sodium (VOLTAREN) 1 % GEL Apply 2 g topically 4 (four) times daily. 100 g 1   meloxicam (MOBIC) 15 MG tablet Take 1 tablet (15 mg total) by mouth daily. 30 tablet 1   Multiple Vitamin (MULTIVITAMIN WITH MINERALS) TABS Take 1 tablet by mouth daily.     omeprazole (PRILOSEC) 40 MG capsule Take 40 mg by mouth daily.     sertraline (ZOLOFT) 50 MG tablet Take 1 tablet (50 mg total) by mouth every morning. 90 tablet 3   tiZANidine (ZANAFLEX) 2 MG tablet      Vitamin Mixture (ESTER-C PO)  Take 1,000 mg by mouth.     Acetylcysteine (N-ACETYL-L-CYSTEINE) 600 MG CAPS Take 600 mg by mouth daily. (Patient not taking: No sig reported) 90 capsule 3   lamoTRIgine (LAMICTAL) 150 MG tablet TAKE 1 TABLET IN THE MORNING AND 2 TABLETS IN THE EVENING (DOSE INCREASED) 270 tablet 3   MELATONIN PO Take by mouth. (Patient not taking: Reported on 12/30/2020)     propranolol (INDERAL) 10 MG tablet Take 1-2 tablets (10-20 mg total) by mouth 2 (two) times daily as needed (tremor). (Patient not taking: No sig reported) 60 tablet 1   No current facility-administered medications for this visit.    Medication Side Effects: None  Allergies:  Allergies  Allergen Reactions   Flexeril [Cyclobenzaprine]     Unable to leave home when taking as she is incoherent/unsure where she is at times   Hydrocodone     REACTION: GI upset   Motrin [Ibuprofen]  Robaxin [Methocarbamol]     Impairs judgement    Past Medical History:  Diagnosis Date   ACNE NEC    ALLERGIC RHINITIS    ALOPECIA    Anal fissure    Anxiety    Panic Attack.  None in a long time   Arthritis    Bowel obstruction (HCC)    Chronic headaches    migraine- none in years   Constipation    drinks tea with senna   DEPRESSION    Depression    DISORDER, BIPOLAR NEC    Family history of breast cancer    Family history of pancreatic cancer    GERD (gastroesophageal reflux disease)    HEMORRHOIDS-INTERNAL    History of stress test    ETT 10/17: Ex 7'; no chest pain, no ST changes, [facility] Treadmill Score 7   HYPERLIPIDEMIA    OBSTRUCTIVE SLEEP APNEA    Seasonal allergies     Family History  Problem Relation Age of Onset   Other Paternal Aunt        x 2, blood clotting disorder   Breast cancer Paternal Aunt 60   Heart disease Sister    Kidney disease Maternal Uncle    Esophageal cancer Paternal Uncle    Pancreatic cancer Paternal Uncle    COPD Father    Breast cancer Mother 30   Breast cancer Maternal Grandmother 45   Stroke  Maternal Grandfather    Aneurysm Paternal Grandmother        brain   Hypertension Brother    Breast cancer Maternal Aunt        dx in her 48s   Breast cancer Maternal Aunt        dx in her 63s   Cancer Maternal Uncle        NOS   Cancer Maternal Uncle        tumor on his head   Breast cancer Paternal Aunt        dx in her 18s   Breast cancer Paternal Aunt        dx in her 60s   Lung cancer Paternal Uncle    Breast cancer Cousin 53       paternal first cousin   Colon cancer Maternal Uncle    Colon polyps Maternal Uncle    Irritable bowel syndrome Cousin    Rectal cancer Neg Hx    Stomach cancer Neg Hx     Social History   Socioeconomic History   Marital status: Married    Spouse name: Not on file   Number of children: 4   Years of education: Not on file   Highest education level: Not on file  Occupational History   Occupation: disabled    Employer: UNEMPLOYED  Tobacco Use   Smoking status: Former    Packs/day: 0.10    Years: 7.00    Pack years: 0.70    Types: Cigarettes    Quit date: 06/19/1981    Years since quitting: 39.5   Smokeless tobacco: Never  Substance and Sexual Activity   Alcohol use: No    Comment: rare   Drug use: No   Sexual activity: Not on file  Other Topics Concern   Not on file  Social History Narrative   Not on file   Social Determinants of Health   Financial Resource Strain: Not on file  Food Insecurity: Not on file  Transportation Needs: Not on file  Physical Activity: Not on file  Stress: Not on file  Social Connections: Not on file  Intimate Partner Violence: Not on file    Past Medical History, Surgical history, Social history, and Family history were reviewed and updated as appropriate.   Please see review of systems for further details on the patient's review from today.   Objective:   Physical Exam:  There were no vitals taken for this visit.  Physical Exam Constitutional:      General: She is not in acute  distress.    Appearance: Normal appearance. She is obese.  Musculoskeletal:        General: No deformity.  Neurological:     Mental Status: She is alert and oriented to person, place, and time.     Coordination: Coordination normal.     Gait: Gait normal.  Psychiatric:        Attention and Perception: Attention and perception normal.        Mood and Affect: Mood is not anxious or depressed. Affect is not labile, blunt, angry or inappropriate.        Speech: Speech normal.        Behavior: Behavior normal.        Thought Content: Thought content normal. Thought content does not include homicidal or suicidal ideation. Thought content does not include homicidal or suicidal plan.        Cognition and Memory: Cognition normal.        Judgment: Judgment normal.     Comments: Insight intact.  Occ irritable. No auditory or visual hallucinations. No delusions.  Some stress.    Lab Review:     Component Value Date/Time   NA 140 01/26/2017 1030   K 4.1 01/26/2017 1030   CL 108 01/26/2017 1030   CO2 28 01/26/2017 1030   GLUCOSE 94 01/26/2017 1030   BUN 7 01/26/2017 1030   CREATININE 0.60 10/17/2017 0727   CALCIUM 10.2 01/26/2017 1030   CALCIUM 10.0 01/26/2017 1030   PROT 7.3 01/26/2017 1030   ALBUMIN 4.4 01/26/2017 1030   AST 26 01/26/2017 1030   ALT 52 (H) 01/26/2017 1030   ALKPHOS 117 01/26/2017 1030   BILITOT 0.3 01/26/2017 1030   GFRNONAA >90 03/07/2014 0749   GFRAA >90 03/07/2014 0749       Component Value Date/Time   WBC 6.6 04/03/2016 1201   RBC 4.44 04/03/2016 1201   HGB 13.6 04/03/2016 1201   HCT 40.5 04/03/2016 1201   PLT 322.0 04/03/2016 1201   MCV 91.2 04/03/2016 1201   MCH 30.7 03/07/2014 0749   MCHC 33.7 04/03/2016 1201   RDW 13.3 04/03/2016 1201   LYMPHSABS 2.5 04/03/2016 1201   MONOABS 0.5 04/03/2016 1201   EOSABS 0.2 04/03/2016 1201   BASOSABS 0.0 04/03/2016 1201    Lithium Lvl  Date Value Ref Range Status  06/05/2013 0.59 (L) 0.80 - 1.40 mEq/L Final      No results found for: PHENYTOIN, PHENOBARB, VALPROATE, CBMZ   .res Assessment: Plan:    Bipolar I disorder with seasonal pattern ([place]) - Plan: lamoTRIgine (LAMICTAL) 150 MG tablet  Generalized anxiety disorder Mild cognitive complaints could possibly be medication side effects or unrelated.  Mrs. Reveron has has not had significant mood cycling.  Her husband passed April 2021.  She has had a lot of support.  She is handling it well. [employer] faith has helped. .    30 min appt.  Overall she is doing better than usual.  She manages to function well physically and  socially.   the sertraline has seemed to help with her irritability though she understands there is some risk of mood cycling from that medication.  At this time she feels like the benefits exceed the side effects of the current regimen. She's planning to move back To [place] most likely.  She has benfitted from [place].  Cont meds.  No change indicated Continue Equetro 900 mg HS Continue Wellbutrin XL 300 mg every morning Continue sertraline 50 mg daily for panic Continue lamotrigine 150 mg 3 daily for depression Currently she is not using propranolol.  Adjusted to living alone.  Disc how to deal with paranoid friend.  FU 6 mos bc no med changes  Lynder Parents, MD, DFAPA   Please see After Visit Summary for patient specific instructions.  No future appointments.  No orders of the defined types were placed in this encounter.     -------------------------------

## 2021-02-14 ENCOUNTER — Other Ambulatory Visit: Payer: Self-pay

## 2021-02-14 ENCOUNTER — Telehealth: Payer: Self-pay | Admitting: Psychiatry

## 2021-02-14 DIAGNOSIS — F319 Bipolar disorder, unspecified: Secondary | ICD-10-CM

## 2021-02-14 MED ORDER — BUPROPION HCL ER (XL) 300 MG PO TB24: 300.0000 mg | ORAL_TABLET | Freq: Every day | ORAL | 0 refills | Status: DC

## 2021-02-14 NOTE — Telephone Encounter (Signed)
Rx sent 

## 2021-02-14 NOTE — Telephone Encounter (Signed)
Pt called and has lost her Buproprion.  Requested Rx of 4-5 days one time only be sent to [facility]

## 2021-02-14 NOTE — Telephone Encounter (Signed)
Please review

## 2021-02-14 NOTE — Telephone Encounter (Signed)
Ok to send RX?

## 2021-05-10 ENCOUNTER — Telehealth: Payer: Self-pay | Admitting: Psychiatry

## 2021-05-10 NOTE — Telephone Encounter (Signed)
Received fax from [facility] needing [facility] Provider Statement to be completed. Placed in Traci's box.

## 2021-06-27 ENCOUNTER — Encounter: Payer: Self-pay | Admitting: Psychiatry

## 2021-06-27 ENCOUNTER — Ambulatory Visit (INDEPENDENT_AMBULATORY_CARE_PROVIDER_SITE_OTHER): Payer: Medicare Other | Admitting: Psychiatry

## 2021-06-27 ENCOUNTER — Other Ambulatory Visit: Payer: Self-pay

## 2021-06-27 DIAGNOSIS — F319 Bipolar disorder, unspecified: Secondary | ICD-10-CM | POA: Diagnosis not present

## 2021-06-27 DIAGNOSIS — F411 Generalized anxiety disorder: Secondary | ICD-10-CM | POA: Diagnosis not present

## 2021-06-27 NOTE — Progress Notes (Signed)
Brittany [name] [id number] 10-Mar-1956 66 y.o.     Subjective:   Patient ID:  Brittany [name] is a 66 y.o. (DOB 09-20-1955) female.  Chief Complaint:  Chief Complaint  Patient presents with   Follow-up    Bipolar I disorder with seasonal pattern ([place])    HPI Brittany [name] presents to the office today for follow-up of bipolar disorder.    seen Oct 25, 2018.  For complaints of cognition Wellbutrin was increased to 450 mg each morning.  This was felt to be safer than introducing stimulants.  seen October 2020, the following was noted: Called back a couple of weeks later and wanted to reduce the dosage of Wellbutrin back to 300 mg daily.  It made her feel bad physically on edge.   Isolation is managed..  Mood relatively.  OK.  Has back pain that interferes with normal function.  House is a mess bc can't do much.  Hurt her back in there [employer] NAC has helped after a couple of months.  Also feels more mellow and nice.  Still problems with concentration.  Asks about meds to help with attention span. Asks about Adderall.  Intermittent problems with forgetfulness and other days she drops the ball. Rhodell working on self care with walking and watching caffeine and doesn't want to take more meds.  Still living in [place].   Overall mood is pretty good without swings or irritability generally.   No meds were changed.  10/22/2019 appointment, the following is noted: She continues N-acetylcysteine, Wellbutrin XL 300 mg daily, Equetro 600 mg nightly, lamotrigine 300 mg daily, sertraline 50 mg daily. Had to cancel bc H so sick and died Oct 24, 2019.  Haven't been alone.   Hasn't decided about where to live.  Will stay where she is for a year.  People are visiting.  [facility] service will be in June.   Peaceful passing.  The family has been supportive.  His kids have been OK.   Sold the hunting cabin to his boys.   She feels good about how she handled things.  Insurance will pay off the bills and has no  mortgage.    Occ periods of racing thoughts and periods of irrritability or periods of isolation.  Overall though doing well.  Overall mood is pretty good without swings or irritability generally.   Patient reports stable mood and denies depressed or irritable moods.  Patient denies any recent difficulty with anxiety.  Patient denies difficulty with sleep initiation or maintenance with melatonin. Denies appetite disturbance.  Patient reports that energy and motivation have been good.  Patient denies any difficulty with concentration.  Patient denies any suicidal ideation.  Protects sleeep 8-4AM.  Melatonin occ.  Not tired unusually.  06/28/2020 appt noted: Had more anxiety and increased Equetro to 1 twice daily and continued sertraline 50 bc it can make her more hyper.   Very sensitive to alcohol so can't have much of it.   Stopped caffeine unless travels. Anxiety was bad and it is better now. H deceased and problems with his 2 sons but not his daughters. One son with good manners and the other one lacks. Lose phone.   Adjusting pretty well to being a widow.  Can sleep alone and feel OK about it.  More accepting of being alone.  He left her money.  Takes Prevagen and she can tell a difference.   Going to [employer] and will be baptized into the faith. No recent temper  problems lately.   Tolerating meds.  Sleep better.  More energy with Prevagen. Still back pain abu tdcan clean house.    12/30/20 appt noted: Getting along OK.  Having to expel H's son's from her property.  Will is supportive of her position.   Disc friend who is paranoid.  Still living in [place].   Recognizes pt had prior history of paranoia with prior manic ipisodes.  No longer has these sx. No problems with the meds. Patient reports stable mood and denies depressed or irritable moods.  Patient denies any recent difficulty with anxiety.  Patient denies difficulty with sleep initiation or maintenance. Denies appetite  disturbance.  Patient reports that energy and motivation have been good.  Patient denies any difficulty with concentration.  Patient denies any suicidal ideation. Satisfied with meds.  No problems with meds. Plan: no med changes  06/27/2021 appointment with the following noted: Takes all meds at night including Wellbutrin. Dogs wake her.  Don't feel safe without the dogs.  Asks if she can ever stop the meds.  Trying to move back to [place]. Not depressed but prone to anxiety and worry. Knows she should exercise.   Dog good for her mental health.  Past Psychiatric Medication Trials: naltrexone,  sertraline 50,  propranolol hydroxyzine SE jerks,  wellbutrin 450 SE,  lamotrigine 300,  lithium, Geodon,  Equetro, doxepin  Review of Systems:  Review of Systems  Constitutional:  Positive for unexpected weight change.  Cardiovascular:  Negative for chest pain and palpitations.  Musculoskeletal:  Positive for back pain and neck pain.  Neurological:  Positive for dizziness. Negative for tremors and weakness.  Hematological:  Negative for adenopathy. Does not bruise/bleed easily.  Psychiatric/Behavioral:  Negative for agitation, behavioral problems, confusion, decreased concentration, dysphoric mood, hallucinations, self-injury, sleep disturbance and suicidal ideas. The patient is not nervous/anxious and is not hyperactive.   Still in physical therapy.  Getting nerve block for her back.  Medications: I have reviewed the patient's current medications.  Current Outpatient Medications  Medication Sig Dispense Refill   buPROPion (WELLBUTRIN XL) 300 MG 24 hr tablet Take 1 tablet (300 mg total) by mouth daily. 7 tablet 0   Carbamazepine (EQUETRO) 300 MG CP12 Take 3 capsules (900 mg total) by mouth at bedtime. 3 in evening (Patient taking differently: Take 900 mg by mouth at bedtime.) 270 capsule 3   diclofenac sodium (VOLTAREN) 1 % GEL Apply 2 g topically 4 (four) times daily. 100 g 1   lamoTRIgine  (LAMICTAL) 150 MG tablet TAKE 1 TABLET IN THE MORNING AND 2 TABLETS IN THE EVENING (DOSE INCREASED) 270 tablet 3   meloxicam (MOBIC) 15 MG tablet Take 1 tablet (15 mg total) by mouth daily. 30 tablet 1   Multiple Vitamin (MULTIVITAMIN WITH MINERALS) TABS Take 1 tablet by mouth daily.     omeprazole (PRILOSEC) 40 MG capsule Take 40 mg by mouth daily.     sertraline (ZOLOFT) 50 MG tablet Take 1 tablet (50 mg total) by mouth every morning. 90 tablet 3   tiZANidine (ZANAFLEX) 2 MG tablet      Vitamin Mixture (ESTER-C PO) Take 1,000 mg by mouth.     Acetylcysteine (N-ACETYL-L-CYSTEINE) 600 MG CAPS Take 600 mg by mouth daily. (Patient not taking: No sig reported) 90 capsule 3   MELATONIN PO Take by mouth. (Patient not taking: Reported on 12/30/2020)     No current facility-administered medications for this visit.    Medication Side Effects: None  Allergies:  Allergies  Allergen Reactions   Flexeril [Cyclobenzaprine]     Unable to leave home when taking as she is incoherent/unsure where she is at times   Hydrocodone     REACTION: GI upset   Motrin [Ibuprofen]    Robaxin [Methocarbamol]     Impairs judgement    Past Medical History:  Diagnosis Date   ACNE NEC    ALLERGIC RHINITIS    ALOPECIA    Anal fissure    Anxiety    Panic Attack.  None in a long time   Arthritis    Bowel obstruction (HCC)    Chronic headaches    migraine- none in years   Constipation    drinks tea with senna   DEPRESSION    Depression    DISORDER, BIPOLAR NEC    Family history of breast cancer    Family history of pancreatic cancer    GERD (gastroesophageal reflux disease)    HEMORRHOIDS-INTERNAL    History of stress test    ETT 10/17: Ex 7'; no chest pain, no ST changes, [facility] Treadmill Score 7   HYPERLIPIDEMIA    OBSTRUCTIVE SLEEP APNEA    Seasonal allergies     Family History  Problem Relation Age of Onset   Other Paternal Aunt        x 2, blood clotting disorder   Breast cancer Paternal Aunt  49   Heart disease Sister    Kidney disease Maternal Uncle    Esophageal cancer Paternal Uncle    Pancreatic cancer Paternal Uncle    COPD Father    Breast cancer Mother 32   Breast cancer Maternal Grandmother 45   Stroke Maternal Grandfather    Aneurysm Paternal Grandmother        brain   Hypertension Brother    Breast cancer Maternal Aunt        dx in her 22s   Breast cancer Maternal Aunt        dx in her 73s   Cancer Maternal Uncle        NOS   Cancer Maternal Uncle        tumor on his head   Breast cancer Paternal Aunt        dx in her 91s   Breast cancer Paternal Aunt        dx in her 14s   Lung cancer Paternal Uncle    Breast cancer Cousin 83       paternal first cousin   Colon cancer Maternal Uncle    Colon polyps Maternal Uncle    Irritable bowel syndrome Cousin    Rectal cancer Neg Hx    Stomach cancer Neg Hx     Social History   Socioeconomic History   Marital status: Married    Spouse name: Not on file   Number of children: 4   Years of education: Not on file   Highest education level: Not on file  Occupational History   Occupation: disabled    Employer: UNEMPLOYED  Tobacco Use   Smoking status: Former    Packs/day: 0.10    Years: 7.00    Pack years: 0.70    Types: Cigarettes    Quit date: 06/19/1981    Years since quitting: 40.0   Smokeless tobacco: Never  Substance and Sexual Activity   Alcohol use: No    Comment: rare   Drug use: No   Sexual activity: Not on file  Other Topics Concern   Not on file  Social History Narrative   Not on file   Social Determinants of Health   Financial Resource Strain: Not on file  Food Insecurity: Not on file  Transportation Needs: Not on file  Physical Activity: Not on file  Stress: Not on file  Social Connections: Not on file  Intimate Partner Violence: Not on file    Past Medical History, Surgical history, Social history, and Family history were reviewed and updated as appropriate.   Please see  review of systems for further details on the patient's review from today.   Objective:   Physical Exam:  There were no vitals taken for this visit.  Physical Exam Constitutional:      General: She is not in acute distress.    Appearance: Normal appearance. She is obese.  Musculoskeletal:        General: No deformity.  Neurological:     Mental Status: She is alert and oriented to person, place, and time.     Coordination: Coordination normal.     Gait: Gait normal.  Psychiatric:        Attention and Perception: Attention and perception normal.        Mood and Affect: Mood is not anxious or depressed. Affect is not labile, blunt, angry or inappropriate.        Speech: Speech normal.        Behavior: Behavior normal. Behavior is not slowed.        Thought Content: Thought content normal. Thought content does not include homicidal or suicidal ideation. Thought content does not include homicidal or suicidal plan.        Cognition and Memory: Cognition normal.        Judgment: Judgment normal.     Comments: Insight intact.  Occ irritable. No auditory or visual hallucinations. No delusions.  Some stress.    Lab Review:     Component Value Date/Time   NA 140 01/26/2017 1030   K 4.1 01/26/2017 1030   CL 108 01/26/2017 1030   CO2 28 01/26/2017 1030   GLUCOSE 94 01/26/2017 1030   BUN 7 01/26/2017 1030   CREATININE 0.60 10/17/2017 0727   CALCIUM 10.2 01/26/2017 1030   CALCIUM 10.0 01/26/2017 1030   PROT 7.3 01/26/2017 1030   ALBUMIN 4.4 01/26/2017 1030   AST 26 01/26/2017 1030   ALT 52 (H) 01/26/2017 1030   ALKPHOS 117 01/26/2017 1030   BILITOT 0.3 01/26/2017 1030   GFRNONAA >90 03/07/2014 0749   GFRAA >90 03/07/2014 0749       Component Value Date/Time   WBC 6.6 04/03/2016 1201   RBC 4.44 04/03/2016 1201   HGB 13.6 04/03/2016 1201   HCT 40.5 04/03/2016 1201   PLT 322.0 04/03/2016 1201   MCV 91.2 04/03/2016 1201   MCH 30.7 03/07/2014 0749   MCHC 33.7 04/03/2016 1201    RDW 13.3 04/03/2016 1201   LYMPHSABS 2.5 04/03/2016 1201   MONOABS 0.5 04/03/2016 1201   EOSABS 0.2 04/03/2016 1201   BASOSABS 0.0 04/03/2016 1201    Lithium Lvl  Date Value Ref Range Status  06/05/2013 0.59 (L) 0.80 - 1.40 mEq/L Final     No results found for: PHENYTOIN, PHENOBARB, VALPROATE, CBMZ   .res Assessment: Plan:    Bipolar I disorder with seasonal pattern ([place])  Generalized anxiety disorder Mild cognitive complaints could possibly be medication side effects or unrelated.  Mrs. Langwell has has not had significant mood cycling.  Her husband passed April 2021.  She has had  a lot of support.  She is handling it well. Jehovah's Witness faith has helped. .    30 min appt.  Overall she is doing better than usual.  She manages to function well physically and socially.   the sertraline has seemed to help with her irritability though she understands there is some risk of mood cycling from that medication.  At this time she feels like the benefits exceed the side effects of the current regimen. She's planning to move back To [place] most likely.  Disc bipolar dx and need for meds longterm.  Disc treatment plan and SE.  She has benfitted from [place].  Cont meds.  No change indicated Continue Equetro 900 mg HS Continue Wellbutrin XL 300 mg every morning.  Option weaning. Defer. Continue sertraline 50 mg daily for panic Continue lamotrigine 150 mg 3 daily for depression Currently she is not using propranolol.  Adjusted to living alone.  Disc how to deal with paranoid friend.  FU 6 mos bc no med changes  Lynder Parents, MD, DFAPA   Please see After Visit Summary for patient specific instructions.  No future appointments.  No orders of the defined types were placed in this encounter.      -------------------------------

## 2021-06-27 NOTE — Patient Instructions (Signed)
Retrial NAC 600 mg capsules 2 daily to help with mild cognitive problems.  It can be combined with a B-complex vitamin as the B-12 and folate have been shown to sometimes enhance the effect.

## 2021-07-13 ENCOUNTER — Telehealth: Payer: Self-pay | Admitting: Psychiatry

## 2021-07-13 NOTE — Telephone Encounter (Signed)
Pt called reporting med change 1/9. For over a week now she has started having chest pain when she takes meds. Lamotrigine and Wellbutrine together. She takes pepto bismol to get relief. Not unbearable just uncomfortable.Contact Pt @ [phone].

## 2021-07-13 NOTE — Telephone Encounter (Signed)
LVM for patient to RC.

## 2021-07-13 NOTE — Telephone Encounter (Signed)
She could experiment with taking the lamotrigine at a different time of day.  The Wellbutrin must be taken in the morning because it has a tendency to keep people awake if taken at night.  But the lamotrigine does not tend to have that side effect and she could not take it any time of day she wants.  Most likely if she took these medicines with food it would not bother her.  I am glad she is going to see her primary care doctor about it on Friday.

## 2021-07-13 NOTE — Telephone Encounter (Signed)
See phone message. Called patient and she said she took lamotrigine by itself and had some discomfort that resolved with Pepto Bismol. She took both the lamotrigine and the Wellbutrin together today and she said "immediately" she had discomfort. She drank some almond milk and discomfort quickly resolved. Patient said she is also having more burping lately. She describes pain as being right between her breasts. She does have a history of GERD due doesn't take anything for it. She said she has an appt with her PCP on Friday.

## 2021-07-14 NOTE — Telephone Encounter (Signed)
Notified patient of recommendations. She states she is taking medication with almond milk or yogurt now and sx are much better.

## 2021-07-27 ENCOUNTER — Other Ambulatory Visit: Payer: Self-pay

## 2021-07-27 ENCOUNTER — Telehealth: Payer: Self-pay | Admitting: Psychiatry

## 2021-07-27 ENCOUNTER — Other Ambulatory Visit: Payer: Self-pay | Admitting: Psychiatry

## 2021-07-27 DIAGNOSIS — F411 Generalized anxiety disorder: Secondary | ICD-10-CM

## 2021-07-27 DIAGNOSIS — F319 Bipolar disorder, unspecified: Secondary | ICD-10-CM

## 2021-07-27 MED ORDER — SERTRALINE HCL 50 MG PO TABS: 50.0000 mg | ORAL_TABLET | Freq: Every morning | ORAL | 0 refills | Status: DC

## 2021-07-27 NOTE — Telephone Encounter (Signed)
Rx sent 

## 2021-07-27 NOTE — Telephone Encounter (Signed)
Next appt is 05/23/22. Requesting RX for Sertraline called to:  [place], [place]  Phone:  [phone]  Fax:  [phone]    Alona has 3 pills left and needs at least 10-12 pills called in to the below pharmacy until her pills get here.   [id number], [address] AT [address]  Phone:  [phone]  Fax:  [phone]

## 2021-07-27 NOTE — Telephone Encounter (Signed)
Pt LVM asking for refill of Sertraline to be sent to [facility] in [place].  She would also like a script sent to [facility]. She has 3 days of medicine left.   Next appt 12/5

## 2021-08-17 HISTORY — PX: EYE SURGERY: SHX253

## 2021-08-23 DIAGNOSIS — Z87891 Personal history of nicotine dependence: Secondary | ICD-10-CM | POA: Insufficient documentation

## 2021-12-26 ENCOUNTER — Ambulatory Visit: Payer: Medicare Other | Admitting: Psychiatry

## 2022-01-10 DIAGNOSIS — M48061 Spinal stenosis, lumbar region without neurogenic claudication: Secondary | ICD-10-CM | POA: Insufficient documentation

## 2022-01-19 ENCOUNTER — Other Ambulatory Visit: Payer: Self-pay | Admitting: Psychiatry

## 2022-01-19 ENCOUNTER — Telehealth: Payer: Self-pay | Admitting: Psychiatry

## 2022-01-19 DIAGNOSIS — F411 Generalized anxiety disorder: Secondary | ICD-10-CM

## 2022-01-19 DIAGNOSIS — F319 Bipolar disorder, unspecified: Secondary | ICD-10-CM

## 2022-01-19 MED ORDER — SERTRALINE HCL 50 MG PO TABS: 50.0000 mg | ORAL_TABLET | Freq: Every morning | ORAL | 0 refills | Status: DC

## 2022-01-19 NOTE — Telephone Encounter (Signed)
12 tabs were sent on 07/27/21.ok to send a full rx?Pt is requesting 5 pills

## 2022-01-19 NOTE — Telephone Encounter (Signed)
Next visit is 04/05/22. Brittany [name] has 7 Sertraline left and is requesting a refill for 5 more. She doesn't want to run out of her medicine. Pharmacy is:  [name], [name]  Phone:  [phone]  Fax:  [phone]

## 2022-01-24 ENCOUNTER — Other Ambulatory Visit: Payer: Self-pay

## 2022-01-24 DIAGNOSIS — F411 Generalized anxiety disorder: Secondary | ICD-10-CM

## 2022-01-24 MED ORDER — SERTRALINE HCL 50 MG PO TABS: 50.0000 mg | ORAL_TABLET | Freq: Every morning | ORAL | 0 refills | Status: DC

## 2022-01-24 NOTE — Telephone Encounter (Signed)
Informed pt she will have to use good [facility] and sent coupon

## 2022-01-24 NOTE — Telephone Encounter (Signed)
Pt has called again asking for 15 more pills to be sent to   [facility] #[id number] [facility] [place] [address] [place] [address] AT [place], [place] [address]  Phone:  [phone]  Fax:  [phone]   She doesn't know when the [facility] ones will arrive.  She gave me [phone] at [place].  I don't know if she wants someone to call them or what.  May be best to call her first.

## 2022-01-24 NOTE — Telephone Encounter (Signed)
Pt called back at 10:26a.  She said [facility] said insurance won't cover it.  She said she needs someone to do something because she only has 3 pills left.

## 2022-01-24 NOTE — Telephone Encounter (Signed)
Spoke to pt rx sent  

## 2022-02-01 ENCOUNTER — Ambulatory Visit (INDEPENDENT_AMBULATORY_CARE_PROVIDER_SITE_OTHER): Payer: Medicare Other

## 2022-02-01 ENCOUNTER — Ambulatory Visit (INDEPENDENT_AMBULATORY_CARE_PROVIDER_SITE_OTHER): Payer: Medicare Other | Admitting: Pulmonary Disease

## 2022-02-01 ENCOUNTER — Encounter: Payer: Self-pay | Admitting: Pulmonary Disease

## 2022-02-01 ENCOUNTER — Ambulatory Visit (HOSPITAL_BASED_OUTPATIENT_CLINIC_OR_DEPARTMENT_OTHER): Payer: Medicare Other | Attending: Pulmonary Disease | Admitting: Internal Medicine

## 2022-02-01 VITALS — Ht 62.0 in | Wt 190.0 lb

## 2022-02-01 VITALS — BP 126/82 | HR 61 | Ht 62.0 in | Wt 189.1 lb

## 2022-02-01 DIAGNOSIS — R0600 Dyspnea, unspecified: Secondary | ICD-10-CM

## 2022-02-01 DIAGNOSIS — G4733 Obstructive sleep apnea (adult) (pediatric): Secondary | ICD-10-CM | POA: Diagnosis present

## 2022-02-01 DIAGNOSIS — R0601 Orthopnea: Secondary | ICD-10-CM

## 2022-02-01 DIAGNOSIS — N2889 Other specified disorders of kidney and ureter: Secondary | ICD-10-CM

## 2022-02-01 DIAGNOSIS — R937 Abnormal findings on diagnostic imaging of other parts of musculoskeletal system: Secondary | ICD-10-CM

## 2022-02-01 DIAGNOSIS — R0981 Nasal congestion: Secondary | ICD-10-CM | POA: Diagnosis not present

## 2022-02-01 LAB — BRAIN NATRIURETIC PEPTIDE: Pro B Natriuretic peptide (BNP): 24 pg/mL (ref 0.0–100.0)

## 2022-02-01 MED ORDER — AYR SALINE NASAL NA GEL: 1.0000 | Freq: Four times a day (QID) | NASAL | 0 refills | Status: DC | PRN

## 2022-02-01 NOTE — Patient Instructions (Signed)
Orthopnea: This is the medical term for feeling short of breath when you lay flat We will check a test today called a brain natruretic peptide that looks for fluid collecting on the lungs I think this may be related to your obstructive sleep apnea, see below  Recent finding on MRI spine of collapsed lung: Chest x-ray today  Obstructive sleep apnea with feeling that the CPAP machine pressures not enough: Because you have gained weight since you started using CPAP 15 years ago we will order a CPAP titration study  Sinus congestion with CPAP use: Use saline gel in each nostril at bedtime prior to putting on the CPAP machine  Kidney mass: Follow up with [facility]  We will see you back in 6 to 8 weeks or sooner if needed

## 2022-02-01 NOTE — Addendum Note (Signed)
Addended by: Valerie Salts on: 02/01/2022 01:56 PM   Modules accepted: Orders

## 2022-02-01 NOTE — Progress Notes (Signed)
Synopsis: Referred in August 2023 for "collapsed lung". She used to smoke years ago, quit in 1983 after smoking < 1/2ppd.  Subjective:   PATIENT ID: Brittany [name] Probus GENDER: female DOB: 03-22-1956, MRN: [id number]   HPI  Chief Complaint  Patient presents with   Consult    States she had a MRI a month ago for a collapsed lung that was seen. [facility] in [place].    [name] says that she can't breathe when she is lying flat.  She has been propping the head of her bed up which helps a lot.  She is on CPAP for OSA, but she feels like she needs more pressure in order for it to work.  She is using it more when she takes a nap.  She uses her CPAP all night every night.  She has been feeling more stopped up in her nose when she uses her CPAP machine.  She doesn't have pain in her chest, has had more acid reflux lately though.    She denies dyspnea on exertion.  Denies leg swelling.    Past Medical History:  Diagnosis Date   ACNE NEC    ALLERGIC RHINITIS    ALOPECIA    Anal fissure    Anxiety    Panic Attack.  None in a long time   Arthritis    Bowel obstruction (HCC)    Chronic headaches    migraine- none in years   Constipation    drinks tea with senna   DEPRESSION    Depression    DISORDER, BIPOLAR NEC    Family history of breast cancer    Family history of pancreatic cancer    GERD (gastroesophageal reflux disease)    HEMORRHOIDS-INTERNAL    History of stress test    ETT 10/17: Ex 7'; no chest pain, no ST changes, Duke Treadmill Score 7   HYPERLIPIDEMIA    OBSTRUCTIVE SLEEP APNEA    Seasonal allergies      Family History  Problem Relation Age of Onset   Other Paternal Aunt        x 2, blood clotting disorder   Breast cancer Paternal Aunt 50   Heart disease Sister    Kidney disease Maternal Uncle    Esophageal cancer Paternal Uncle    Pancreatic cancer Paternal Uncle    COPD Father    Breast cancer Mother 65   Breast cancer Maternal Grandmother 45    Stroke Maternal Grandfather    Aneurysm Paternal Grandmother        brain   Hypertension Brother    Breast cancer Maternal Aunt        dx in her 65s   Breast cancer Maternal Aunt        dx in her 33s   Cancer Maternal Uncle        NOS   Cancer Maternal Uncle        tumor on his head   Breast cancer Paternal Aunt        dx in her 65s   Breast cancer Paternal Aunt        dx in her 83s   Lung cancer Paternal Uncle    Breast cancer Cousin 32       paternal first cousin   Colon cancer Maternal Uncle    Colon polyps Maternal Uncle    Irritable bowel syndrome Cousin    Rectal cancer Neg Hx    Stomach cancer Neg Hx  Social History   Socioeconomic History   Marital status: Married    Spouse name: Not on file   Number of children: 4   Years of education: Not on file   Highest education level: Not on file  Occupational History   Occupation: disabled    Employer: UNEMPLOYED  Tobacco Use   Smoking status: Former    Packs/day: 0.10    Years: 7.00    Total pack years: 0.70    Types: Cigarettes    Quit date: 06/19/1981    Years since quitting: 40.6   Smokeless tobacco: Never  Substance and Sexual Activity   Alcohol use: No    Comment: rare   Drug use: No   Sexual activity: Not on file  Other Topics Concern   Not on file  Social History Narrative   Not on file   Social Determinants of Health   Financial Resource Strain: Not on file  Food Insecurity: Not on file  Transportation Needs: Not on file  Physical Activity: Not on file  Stress: Not on file  Social Connections: Not on file  Intimate Partner Violence: Not on file     Allergies  Allergen Reactions   Flexeril [Cyclobenzaprine]     Unable to leave home when taking as she is incoherent/unsure where she is at times   Hydrocodone     REACTION: GI upset   Motrin [Ibuprofen]    Robaxin [Methocarbamol]     Impairs judgement     Outpatient Medications Prior to Visit  Medication Sig Dispense Refill    buPROPion (WELLBUTRIN XL) 300 MG 24 hr tablet Take 1 tablet (300 mg total) by mouth daily. 7 tablet 0   diclofenac sodium (VOLTAREN) 1 % GEL Apply 2 [name] topically 4 (four) times daily. 100 [name] 1   EQUETRO 300 MG CP12 TAKE 3 CAPSULES IN THE EVENING AT BEDTIME 270 capsule 3   lamoTRIgine (LAMICTAL) 150 MG tablet TAKE 1 TABLET IN THE MORNING AND 2 TABLETS IN THE EVENING (DOSE INCREASED) 270 tablet 0   meloxicam (MOBIC) 15 MG tablet Take 1 tablet (15 mg total) by mouth daily. 30 tablet 1   Multiple Vitamin (MULTIVITAMIN WITH MINERALS) TABS Take 1 tablet by mouth daily.     omeprazole (PRILOSEC) 40 MG capsule Take 40 mg by mouth daily.     sertraline (ZOLOFT) 50 MG tablet Take 1 tablet (50 mg total) by mouth every morning. 15 tablet 0   tiZANidine (ZANAFLEX) 2 MG tablet      Vitamin Mixture (ESTER-C PO) Take 1,000 mg by mouth.     Acetylcysteine (N-ACETYL-L-CYSTEINE) 600 MG CAPS Take 600 mg by mouth daily. (Patient not taking: No sig reported) 90 capsule 3   MELATONIN PO Take by mouth. (Patient not taking: Reported on 12/30/2020)     No facility-administered medications prior to visit.    Review of Systems  Constitutional:  Negative for chills, fever, malaise/fatigue and weight loss.  HENT:  Negative for congestion, nosebleeds, sinus pain and sore throat.   Eyes:  Negative for photophobia, pain and discharge.  Respiratory:  Negative for cough, hemoptysis, sputum production, shortness of breath and wheezing.   Cardiovascular:  Negative for chest pain, palpitations, orthopnea and leg swelling.  Gastrointestinal:  Negative for abdominal pain, constipation, diarrhea, nausea and vomiting.  Genitourinary:  Negative for dysuria, frequency, hematuria and urgency.  Musculoskeletal:  Negative for back pain, joint pain, myalgias and neck pain.  Skin:  Negative for itching and rash.  Neurological:  Negative  for tingling, tremors, sensory change, speech change, focal weakness, seizures, weakness and headaches.   Psychiatric/Behavioral:  Negative for memory loss, substance abuse and suicidal ideas. The patient is not nervous/anxious.       Objective:  Physical Exam   Vitals:   02/01/22 1134  BP: 126/82  Pulse: 61  SpO2: 96%  Weight: 189 lb 1.6 oz (85.8 kg)  Height: '5\' 2"'$  (1.575 m)    Gen: well appearing, no acute distress HENT: NCAT, OP clear, neck supple without masses Eyes: PERRL, EOMi Lymph: no cervical lymphadenopathy PULM: CTA B CV: RRR, no mgr, no JVD GI: BS+, soft, nontender, no hsm Derm: no rash or skin breakdown MSK: normal bulk and tone Neuro: A&Ox4, CN II-XII intact, strength 5/5 in all 4 extremities Psyche: normal mood and affect   CBC    Component Value Date/Time   WBC 6.6 04/03/2016 1201   RBC 4.44 04/03/2016 1201   HGB 13.6 04/03/2016 1201   HCT 40.5 04/03/2016 1201   PLT 322.0 04/03/2016 1201   MCV 91.2 04/03/2016 1201   MCH 30.7 03/07/2014 0749   MCHC 33.7 04/03/2016 1201   RDW 13.3 04/03/2016 1201   LYMPHSABS 2.5 04/03/2016 1201   MONOABS 0.5 04/03/2016 1201   EOSABS 0.2 04/03/2016 1201   BASOSABS 0.0 04/03/2016 1201     Chest imaging: 2017 CT chest images independently reviewed showing partial, subsegmental atelectasis in the right middle lobe and right lower lobe  PFT:  Labs:  Path:  Echo:  Heart Catheterization:       Assessment & Plan:   Obstructive sleep apnea - Plan: Cpap titration  Dyspnea, unspecified type - Plan: B Nat Peptide  Orthopnea  Sinus congestion  Kidney mass  Abnormal MRI, spine  Discussion: Lanah is here to see me today because she says an MRI of her spine noted incidentally discovered collapse of her lung.  Unfortunately I am unable to see the report, the report from the most recent MRI of her spine that is available to me makes no mention of this.  She describes orthopnea without other findings suggestive of heart failure.  I am worried that her CPAP pressure is likely not enough as she has gained about  20 to 30 pounds since she was originally prescribed CPAP years ago.  Plan: Orthopnea: This is the medical term for feeling short of breath when you lay flat We will check a test today called a brain natruretic peptide that looks for fluid collecting on the lungs I think this may be related to your obstructive sleep apnea, see below  Recent finding on MRI spine of collapsed lung: Chest x-ray today  Obstructive sleep apnea with feeling that the CPAP machine pressures not enough: Because you have gained weight since you started using CPAP 15 years ago we will order a CPAP titration study  Sinus congestion with CPAP use: Use saline gel in each nostril at bedtime prior to putting on the CPAP machine  Kidney mass: Follow up with [facility]  We will see you back in 6 to 8 weeks or sooner if needed    Current Outpatient Medications:    buPROPion (WELLBUTRIN XL) 300 MG 24 hr tablet, Take 1 tablet (300 mg total) by mouth daily., Disp: 7 tablet, Rfl: 0   diclofenac sodium (VOLTAREN) 1 % GEL, Apply 2 [name] topically 4 (four) times daily., Disp: 100 [name], Rfl: 1   EQUETRO 300 MG CP12, TAKE 3 CAPSULES IN THE EVENING AT BEDTIME, Disp: 270 capsule,  Rfl: 3   lamoTRIgine (LAMICTAL) 150 MG tablet, TAKE 1 TABLET IN THE MORNING AND 2 TABLETS IN THE EVENING (DOSE INCREASED), Disp: 270 tablet, Rfl: 0   meloxicam (MOBIC) 15 MG tablet, Take 1 tablet (15 mg total) by mouth daily., Disp: 30 tablet, Rfl: 1   Multiple Vitamin (MULTIVITAMIN WITH MINERALS) TABS, Take 1 tablet by mouth daily., Disp: , Rfl:    omeprazole (PRILOSEC) 40 MG capsule, Take 40 mg by mouth daily., Disp: , Rfl:    sertraline (ZOLOFT) 50 MG tablet, Take 1 tablet (50 mg total) by mouth every morning., Disp: 15 tablet, Rfl: 0   tiZANidine (ZANAFLEX) 2 MG tablet, , Disp: , Rfl:    Vitamin Mixture (ESTER-C PO), Take 1,000 mg by mouth., Disp: , Rfl:

## 2022-02-01 NOTE — Addendum Note (Signed)
Addended by: Valerie Salts on: 02/01/2022 12:20 PM   Modules accepted: Orders

## 2022-02-02 ENCOUNTER — Telehealth: Payer: Self-pay | Admitting: Pulmonary Disease

## 2022-02-02 NOTE — Telephone Encounter (Signed)
Called patient back and let her know that we dont have the results back yet for sleep study or chest xray at this time. Patient verbalized understanding. Nothing further needed

## 2022-02-02 NOTE — Telephone Encounter (Signed)
Called patient and she is wanting to know the results of her chest xray. I went over lab work already.   She also states that she had her sleep study last night. Im not sure if those results are available yet to you sir.  Please advise sir

## 2022-02-11 DIAGNOSIS — G4733 Obstructive sleep apnea (adult) (pediatric): Secondary | ICD-10-CM | POA: Diagnosis not present

## 2022-02-11 NOTE — Procedures (Signed)
     Patient Name: Brittany [name], Rosamond Date: 02/01/2022 Gender: Female D.O.B: Mar 20, 1956 Age (years): 65 Referring Provider: Simonne Maffucci Height (inches): 62 Interpreting Physician: Baird Lyons MD, ABSM Weight (lbs): 190 RPSGT: Jorge Ny BMI: 35 MRN: [id number] Neck Size: 14.50  CLINICAL INFORMATION The patient is referred for a CPAP titration to treat sleep apnea.  Date of NPSG, Split Night or HST: NPSG 09/04/12  AHI 10.1/ hr, desaturation to 84%, body weight 190 lbs  SLEEP STUDY TECHNIQUE As per the AASM Manual for the Scoring of Sleep and Associated Events v2.3 (April 2016) with a hypopnea requiring 4% desaturations.  The channels recorded and monitored were frontal, central and occipital EEG, electrooculogram (EOG), submentalis EMG (chin), nasal and oral airflow, thoracic and abdominal wall motion, anterior tibialis EMG, snore microphone, electrocardiogram, and pulse oximetry. Continuous positive airway pressure (CPAP) was initiated at the beginning of the study and titrated to treat sleep-disordered breathing.  MEDICATIONS Medications self-administered by patient taken the night of the study : ASPIRIN, Equetro  TECHNICIAN COMMENTS Comments added by technician: None Comments added by scorer: N/A  RESPIRATORY PARAMETERS Optimal PAP Pressure (cm): 9 AHI at Optimal Pressure (/hr): 0 Overall Minimal O2 (%): 86.0 Supine % at Optimal Pressure (%): N/A Minimal O2 at Optimal Pressure (%): 90%   SLEEP ARCHITECTURE The study was initiated at 10:04:00 PM and ended at 4:29:55 AM.  Sleep onset time was 9.1 minutes and the sleep efficiency was 87.2%%. The total sleep time was 336.5 minutes.  The patient spent 4.5%% of the night in stage N1 sleep, 77.3%% in stage N2 sleep, 0.0%% in stage N3 and 18.3% in REM.Stage REM latency was 85.0 minutes  Wake after sleep onset was 40.4. Alpha intrusion was absent. Supine sleep was 100.00%.  CARDIAC DATA The 2 lead EKG demonstrated  sinus rhythm. The mean heart rate was 60.7 beats per minute. Other EKG findings include: None.  LEG MOVEMENT DATA The total Periodic Limb Movements of Sleep (PLMS) were 0. The PLMS index was 0.0. A PLMS index of <15 is considered normal in adults.  IMPRESSIONS - CPAP was titrated to 9 cwp, AHI 0/ hr. - Moderate oxygen desaturations were observed during this titration (min O2 = 86.0%). Minimum O2 ssaaturation on CPAP 9 was 90%. - No snoring was audible during this study. - No cardiac abnormalities were observed during this study. - Clinically significant periodic limb movements were not noted during this study. Arousals associated with PLMs were rare.  DIAGNOSIS - Obstructive Sleep Apnea (G47.33)  RECOMMENDATIONS - Recommend trial of CPAP 9 cwp or autopap 5-15. - Patient wore a small [employer] nasal mask with heated humidity. - Be careful with alcohol, sedatives and other CNS depressants that may worsen sleep apnea and disrupt normal sleep architecture. - Sleep hygiene should be reviewed to assess factors that may improve sleep quality. - Weight management and regular exercise should be initiated or continued.  [Electronically signed] 02/11/2022 11:27 AM  Baird Lyons MD, ABSM Diplomate, American Board of Sleep Medicine NPI: [id number]                        [facility]  ELECTRONICALLY SIGNED ON:  02/11/2022, 11:19 AM [place] PH: [phone]   FX: [phone] [place]

## 2022-02-13 ENCOUNTER — Telehealth: Payer: Self-pay | Admitting: *Deleted

## 2022-02-13 ENCOUNTER — Other Ambulatory Visit: Payer: Self-pay | Admitting: Urology

## 2022-02-13 DIAGNOSIS — N281 Cyst of kidney, acquired: Secondary | ICD-10-CM

## 2022-02-13 DIAGNOSIS — G4733 Obstructive sleep apnea (adult) (pediatric): Secondary | ICD-10-CM

## 2022-02-13 NOTE — Telephone Encounter (Signed)
I called and spoke with the pt and notified of results/recs. She verbalized understanding. She states wants to use set pressure of 9 rather than auto. Order placed to her preferred DME.

## 2022-02-13 NOTE — Telephone Encounter (Signed)
-----   Message from Juanito Doom, MD sent at 02/13/2022  3:48 PM EDT ----- Abbie Sons,  Could you let her know that Dr. Annamaria Boots has interpreted her sleep study and recommends the following:  Recommend trial of CPAP 9 cwp or autopap 5-15.  We'll need to make sure she has that.  Other recommendations: - Patient wore a small [employer] nasal mask with heated humidity. - Be careful with alcohol, sedatives and other CNS depressants that may worsen sleep apnea and disrupt normal sleep architecture. - Sleep hygiene should be reviewed to assess factors that may improve sleep quality. - Weight management and regular exercise should be initiated or continued.  Thanks! Ruby Cola ----- Message ----- From: Deneise Lever, MD Sent: 02/11/2022  11:29 AM EDT To: Juanito Doom, MD

## 2022-03-22 LAB — HM MAMMOGRAPHY

## 2022-04-03 ENCOUNTER — Ambulatory Visit
Admission: RE | Admit: 2022-04-03 | Discharge: 2022-04-03 | Disposition: A | Discharge status: Home / Self Care | Payer: Medicare Other | Source: Ambulatory Visit | Attending: Urology | Admitting: Urology

## 2022-04-03 ENCOUNTER — Ambulatory Visit: Payer: Medicare Other | Admitting: Psychiatry

## 2022-04-03 DIAGNOSIS — N281 Cyst of kidney, acquired: Secondary | ICD-10-CM

## 2022-04-03 MED ORDER — GADOPICLENOL 0.5 MMOL/ML IV SOLN
8.0000 mL | Freq: Once | INTRAVENOUS | Status: AC | PRN
Start: 2022-04-03 — End: 2022-04-03
  Administered 2022-04-03: 8 mL via INTRAVENOUS

## 2022-04-04 ENCOUNTER — Ambulatory Visit: Payer: Medicare Other | Admitting: Pulmonary Disease

## 2022-04-04 NOTE — Progress Notes (Deleted)
Synopsis: Referred in August 2023 in the setting of dyspnea.  She has orthopnea and obstructive sleep apnea.  Subjective:   PATIENT ID: Brittany [name] GENDER: female DOB: 03-16-1956, MRN: [id number]   HPI  No chief complaint on file.   ***  Past Medical History:  Diagnosis Date   ACNE NEC    ALLERGIC RHINITIS    ALOPECIA    Anal fissure    Anxiety    Panic Attack.  None in a long time   Arthritis    Bowel obstruction (HCC)    Chronic headaches    migraine- none in years   Constipation    drinks tea with senna   DEPRESSION    Depression    DISORDER, BIPOLAR NEC    Family history of breast cancer    Family history of pancreatic cancer    GERD (gastroesophageal reflux disease)    HEMORRHOIDS-INTERNAL    History of stress test    ETT 10/17: Ex 7'; no chest pain, no ST changes, Duke Treadmill Score 7   HYPERLIPIDEMIA    OBSTRUCTIVE SLEEP APNEA    Seasonal allergies        Review of Systems  Constitutional:  Negative for chills, fever, malaise/fatigue and weight loss.  HENT:  Negative for congestion, nosebleeds, sinus pain and sore throat.   Eyes:  Negative for photophobia, pain and discharge.  Respiratory:  Negative for cough, hemoptysis, sputum production, shortness of breath and wheezing.   Cardiovascular:  Negative for chest pain, palpitations, orthopnea and leg swelling.  Gastrointestinal:  Negative for abdominal pain, constipation, diarrhea, nausea and vomiting.  Genitourinary:  Negative for dysuria, frequency, hematuria and urgency.  Musculoskeletal:  Negative for back pain, joint pain, myalgias and neck pain.  Skin:  Negative for itching and rash.  Neurological:  Negative for tingling, tremors, sensory change, speech change, focal weakness, seizures, weakness and headaches.  Psychiatric/Behavioral:  Negative for memory loss, substance abuse and suicidal ideas. The patient is not nervous/anxious.       Objective:  Physical Exam   There were no  vitals filed for this visit.  ***   CBC    Component Value Date/Time   WBC 6.6 04/03/2016 1201   RBC 4.44 04/03/2016 1201   HGB 13.6 04/03/2016 1201   HCT 40.5 04/03/2016 1201   PLT 322.0 04/03/2016 1201   MCV 91.2 04/03/2016 1201   MCH 30.7 03/07/2014 0749   MCHC 33.7 04/03/2016 1201   RDW 13.3 04/03/2016 1201   LYMPHSABS 2.5 04/03/2016 1201   MONOABS 0.5 04/03/2016 1201   EOSABS 0.2 04/03/2016 1201   BASOSABS 0.0 04/03/2016 1201     Chest imaging: 2017 CT chest images independently reviewed showing partial, subsegmental atelectasis in the right middle lobe and right lower lobe  PFT:  Labs: September 2023 BNP normal  Path:  Echo:  Heart Catheterization:  Sleep study: August 2023 CPAP titration study: CPAP titrated to 9 cm of water, on this lowest O2 saturation was 90%     Assessment & Plan:   No diagnosis found.  Discussion: Brittany [name] is here to see me today because she says an MRI of her spine noted incidentally discovered collapse of her lung.  Unfortunately I am unable to see the report, the report from the most recent MRI of her spine that is available to me makes no mention of this.  She describes orthopnea without other findings suggestive of heart failure.  I am worried that her CPAP pressure is likely  not enough as she has gained about 20 to 30 pounds since she was originally prescribed CPAP years ago.  Plan: ***    Current Outpatient Medications:    buPROPion (WELLBUTRIN XL) 300 MG 24 hr tablet, Take 1 tablet (300 mg total) by mouth daily., Disp: 7 tablet, Rfl: 0   diclofenac sodium (VOLTAREN) 1 % GEL, Apply 2 g topically 4 (four) times daily., Disp: 100 g, Rfl: 1   EQUETRO 300 MG CP12, TAKE 3 CAPSULES IN THE EVENING AT BEDTIME, Disp: 270 capsule, Rfl: 3   lamoTRIgine (LAMICTAL) 150 MG tablet, TAKE 1 TABLET IN THE MORNING AND 2 TABLETS IN THE EVENING (DOSE INCREASED), Disp: 270 tablet, Rfl: 0   meloxicam (MOBIC) 15 MG tablet, Take 1 tablet (15 mg  total) by mouth daily., Disp: 30 tablet, Rfl: 1   Multiple Vitamin (MULTIVITAMIN WITH MINERALS) TABS, Take 1 tablet by mouth daily., Disp: , Rfl:    omeprazole (PRILOSEC) 40 MG capsule, Take 40 mg by mouth daily., Disp: , Rfl:    saline (AYR) GEL, Place 1 Application into both nostrils every 6 (six) hours as needed., Disp: 42.3 g, Rfl: 0   sertraline (ZOLOFT) 50 MG tablet, Take 1 tablet (50 mg total) by mouth every morning., Disp: 15 tablet, Rfl: 0   tiZANidine (ZANAFLEX) 2 MG tablet, , Disp: , Rfl:    Vitamin Mixture (ESTER-C PO), Take 1,000 mg by mouth., Disp: , Rfl:

## 2022-04-05 ENCOUNTER — Ambulatory Visit (INDEPENDENT_AMBULATORY_CARE_PROVIDER_SITE_OTHER): Payer: Medicare Other | Admitting: Psychiatry

## 2022-04-05 ENCOUNTER — Encounter: Payer: Self-pay | Admitting: Psychiatry

## 2022-04-05 DIAGNOSIS — F411 Generalized anxiety disorder: Secondary | ICD-10-CM | POA: Diagnosis not present

## 2022-04-05 DIAGNOSIS — F319 Bipolar disorder, unspecified: Secondary | ICD-10-CM

## 2022-04-05 MED ORDER — LAMOTRIGINE 150 MG PO TABS: ORAL_TABLET | ORAL | 0 refills | Status: DC

## 2022-04-05 NOTE — Progress Notes (Signed)
Brittany [name] [id number] 08/21/1955 66 y.o.     Subjective:   Patient ID:  Brittany [name] is a 66 y.o. (DOB 12-13-1955) female.  Chief Complaint:  Chief Complaint  Patient presents with   Follow-up    Bipolar I disorder with seasonal pattern ([place])   Anxiety   Other    mood    HPI Brittany [name] presents to the office today for follow-up of bipolar disorder.    seen Oct 25, 2018.  For complaints of cognition Wellbutrin was increased to 450 mg each morning.  This was felt to be safer than introducing stimulants.  seen October 2020, the following was noted: Called back a couple of weeks later and wanted to reduce the dosage of Wellbutrin back to 300 mg daily.  It made her feel bad physically on edge.   Isolation is managed..  Mood relatively.  OK.  Has back pain that interferes with normal function.  House is a mess bc can't do much.  Hurt her back in there [employer]. NAC has helped after a couple of months.  Also feels more mellow and nice.  Still problems with concentration.  Asks about meds to help with attention span. Asks about Adderall.  Intermittent problems with forgetfulness and other days she drops the ball. [place] working on self care with walking and watching caffeine and doesn't want to take more meds.  Still living in [place].   Overall mood is pretty good without swings or irritability generally.   No meds were changed.  10/22/2019 appointment, the following is noted: She continues N-acetylcysteine, Wellbutrin XL 300 mg daily, Equetro 600 mg nightly, lamotrigine 300 mg daily, sertraline 50 mg daily. Had to cancel bc H so sick and died 11-02-19.  Haven't been alone.   Hasn't decided about where to live.  Will stay where she is for a year.  People are visiting.  [facility] service will be in June.   Peaceful passing.  The family has been supportive.  His kids have been OK.   Sold the hunting cabin to his boys.   She feels good about how she handled things.  Insurance will pay  off the bills and has no mortgage.    Occ periods of racing thoughts and periods of irrritability or periods of isolation.  Overall though doing well.  Overall mood is pretty good without swings or irritability generally.   Patient reports stable mood and denies depressed or irritable moods.  Patient denies any recent difficulty with anxiety.  Patient denies difficulty with sleep initiation or maintenance with melatonin. Denies appetite disturbance.  Patient reports that energy and motivation have been good.  Patient denies any difficulty with concentration.  Patient denies any suicidal ideation.  Protects sleeep 8-4AM.  Melatonin occ.  Not tired unusually.  06/28/2020 appt noted: Had more anxiety and increased Equetro to 1 twice daily and continued sertraline 50 bc it can make her more hyper.   Very sensitive to alcohol so can't have much of it.   Stopped caffeine unless travels. Anxiety was bad and it is better now. H deceased and problems with his 2 sons but not his daughters. One son with good manners and the other one lacks. Lose phone.   Adjusting pretty well to being a widow.  Can sleep alone and feel OK about it.  More accepting of being alone.  He left her money.  Takes Prevagen and she can tell a difference.   Going to [employer]  and will be baptized into the faith. No recent temper problems lately.   Tolerating meds.  Sleep better.  More energy with Prevagen. Still back pain abu tdcan clean house.    12/30/20 appt noted: Getting along OK.  Having to expel H's son's from her property.  Will is supportive of her position.   Disc friend who is paranoid.  Still living in [place].   Recognizes pt had prior history of paranoia with prior manic ipisodes.  No longer has these sx. No problems with the meds. Patient reports stable mood and denies depressed or irritable moods.  Patient denies any recent difficulty with anxiety.  Patient denies difficulty with sleep initiation or maintenance.  Denies appetite disturbance.  Patient reports that energy and motivation have been good.  Patient denies any difficulty with concentration.  Patient denies any suicidal ideation. Satisfied with meds.  No problems with meds. Plan: no med changes  06/27/2021 appointment with the following noted: Takes all meds at night including Wellbutrin. Dogs wake her.  Don't feel safe without the dogs.  Asks if she can ever stop the meds.  Trying to move back to [place]. Not depressed but prone to anxiety and worry. Knows she should exercise. Plan: Cont meds.  No change indicated Continue Equetro 900 mg HS Continue Wellbutrin XL 300 mg every morning.  Option weaning. Defer. Continue sertraline 50 mg daily for panic Continue lamotrigine 150 mg 3 daily for depression Currently she is not using propranolol.  04/05/22 appt noted: Off Wellbutrin but taking other meds noted above.  Stopped bc anxiety. No panic lately. Increased NAC  and Prevagen and caused SE.  Had a fall twice in 8 weeks and was confused. Regular doses were fine and did help. Selling her house.  Haven't decided where yet. Consistent with other meds. Rx meclizine for dizziness. No other problems with meds.   Mood is OK.  Dog good for her mental health.  Past Psychiatric Medication Trials: naltrexone,  sertraline 50,  propranolol hydroxyzine SE jerks,  wellbutrin 450 SE,  lamotrigine 300,  lithium, Geodon,  Equetro, doxepin  Review of Systems:  Review of Systems  Constitutional:  Positive for unexpected weight change.  Cardiovascular:  Negative for chest pain and palpitations.  Musculoskeletal:  Positive for back pain and neck pain.  Neurological:  Positive for dizziness. Negative for tremors.  Hematological:  Negative for adenopathy. Does not bruise/bleed easily.  Psychiatric/Behavioral:  Negative for agitation, behavioral problems, confusion, decreased concentration, dysphoric mood, hallucinations, self-injury, sleep disturbance  and suicidal ideas. The patient is not nervous/anxious and is not hyperactive.    Still in physical therapy.  Getting nerve block for her back.  Medications: I have reviewed the patient's current medications.  Current Outpatient Medications  Medication Sig Dispense Refill   diclofenac sodium (VOLTAREN) 1 % GEL Apply 2 g topically 4 (four) times daily. 100 g 1   EQUETRO 300 MG CP12 TAKE 3 CAPSULES IN THE EVENING AT BEDTIME 270 capsule 3   meloxicam (MOBIC) 15 MG tablet Take 1 tablet (15 mg total) by mouth daily. 30 tablet 1   Multiple Vitamin (MULTIVITAMIN WITH MINERALS) TABS Take 1 tablet by mouth daily.     omeprazole (PRILOSEC) 40 MG capsule Take 40 mg by mouth daily.     saline (AYR) GEL Place 1 Application into both nostrils every 6 (six) hours as needed. 42.3 g 0   tiZANidine (ZANAFLEX) 2 MG tablet      Vitamin Mixture (ESTER-C PO) Take 1,000 mg  by mouth.     lamoTRIgine (LAMICTAL) 150 MG tablet TAKE 1 TABLET IN THE MORNING AND 2 TABLETS IN THE EVENING 270 tablet 0   No current facility-administered medications for this visit.    Medication Side Effects: None  Allergies:  Allergies  Allergen Reactions   Flexeril [Cyclobenzaprine]     Unable to leave home when taking as she is incoherent/unsure where she is at times   Hydrocodone     REACTION: GI upset   Motrin [Ibuprofen]    Robaxin [Methocarbamol]     Impairs judgement    Past Medical History:  Diagnosis Date   ACNE NEC    ALLERGIC RHINITIS    ALOPECIA    Anal fissure    Anxiety    Panic Attack.  None in a long time   Arthritis    Bowel obstruction (HCC)    Chronic headaches    migraine- none in years   Constipation    drinks tea with senna   DEPRESSION    Depression    DISORDER, BIPOLAR NEC    Family history of breast cancer    Family history of pancreatic cancer    GERD (gastroesophageal reflux disease)    HEMORRHOIDS-INTERNAL    History of stress test    ETT 10/17: Ex 7'; no chest pain, no ST changes,  Duke Treadmill Score 7   HYPERLIPIDEMIA    OBSTRUCTIVE SLEEP APNEA    Seasonal allergies     Family History  Problem Relation Age of Onset   Other Paternal Aunt        x 2, blood clotting disorder   Breast cancer Paternal Aunt 62   Heart disease Sister    Kidney disease Maternal Uncle    Esophageal cancer Paternal Uncle    Pancreatic cancer Paternal Uncle    COPD Father    Breast cancer Mother 13   Breast cancer Maternal Grandmother 45   Stroke Maternal Grandfather    Aneurysm Paternal Grandmother        brain   Hypertension Brother    Breast cancer Maternal Aunt        dx in her 21s   Breast cancer Maternal Aunt        dx in her 38s   Cancer Maternal Uncle        NOS   Cancer Maternal Uncle        tumor on his head   Breast cancer Paternal Aunt        dx in her 26s   Breast cancer Paternal Aunt        dx in her 41s   Lung cancer Paternal Uncle    Breast cancer Cousin 88       paternal first cousin   Colon cancer Maternal Uncle    Colon polyps Maternal Uncle    Irritable bowel syndrome Cousin    Rectal cancer Neg Hx    Stomach cancer Neg Hx     Social History   Socioeconomic History   Marital status: Married    Spouse name: Not on file   Number of children: 4   Years of education: Not on file   Highest education level: Not on file  Occupational History   Occupation: disabled    Employer: UNEMPLOYED  Tobacco Use   Smoking status: Former    Packs/day: 0.10    Years: 7.00    Total pack years: 0.70    Types: Cigarettes    Quit date: 06/19/1981  Years since quitting: 40.8   Smokeless tobacco: Never  Substance and Sexual Activity   Alcohol use: No    Comment: rare   Drug use: No   Sexual activity: Not on file  Other Topics Concern   Not on file  Social History Narrative   Not on file   Social Determinants of Health   Financial Resource Strain: Not on file  Food Insecurity: Not on file  Transportation Needs: Not on file  Physical Activity: Not  on file  Stress: Not on file  Social Connections: Not on file  Intimate Partner Violence: Not on file    Past Medical History, Surgical history, Social history, and Family history were reviewed and updated as appropriate.   Please see review of systems for further details on the patient's review from today.   Objective:   Physical Exam:  There were no vitals taken for this visit.  Physical Exam Constitutional:      General: She is not in acute distress.    Appearance: Normal appearance. She is obese.  Musculoskeletal:        General: No deformity.  Neurological:     Mental Status: She is alert and oriented to person, place, and time.     Coordination: Coordination normal.     Gait: Gait normal.  Psychiatric:        Attention and Perception: Attention and perception normal.        Mood and Affect: Mood is not anxious or depressed. Affect is not labile, blunt, angry or inappropriate.        Speech: Speech normal.        Behavior: Behavior normal. Behavior is not slowed.        Thought Content: Thought content normal. Thought content is not delusional. Thought content does not include homicidal or suicidal ideation. Thought content does not include suicidal plan.        Cognition and Memory: Cognition normal.        Judgment: Judgment normal.     Comments: Insight intact.  Occ irritable. No auditory or visual hallucinations. No delusions.  Some stress.     Lab Review:     Component Value Date/Time   NA 140 01/26/2017 1030   K 4.1 01/26/2017 1030   CL 108 01/26/2017 1030   CO2 28 01/26/2017 1030   GLUCOSE 94 01/26/2017 1030   BUN 7 01/26/2017 1030   CREATININE 0.60 10/17/2017 0727   CALCIUM 10.2 01/26/2017 1030   CALCIUM 10.0 01/26/2017 1030   PROT 7.3 01/26/2017 1030   ALBUMIN 4.4 01/26/2017 1030   AST 26 01/26/2017 1030   ALT 52 (H) 01/26/2017 1030   ALKPHOS 117 01/26/2017 1030   BILITOT 0.3 01/26/2017 1030   GFRNONAA >90 03/07/2014 0749   GFRAA >90 03/07/2014  0749       Component Value Date/Time   WBC 6.6 04/03/2016 1201   RBC 4.44 04/03/2016 1201   HGB 13.6 04/03/2016 1201   HCT 40.5 04/03/2016 1201   PLT 322.0 04/03/2016 1201   MCV 91.2 04/03/2016 1201   MCH 30.7 03/07/2014 0749   MCHC 33.7 04/03/2016 1201   RDW 13.3 04/03/2016 1201   LYMPHSABS 2.5 04/03/2016 1201   MONOABS 0.5 04/03/2016 1201   EOSABS 0.2 04/03/2016 1201   BASOSABS 0.0 04/03/2016 1201    Lithium Lvl  Date Value Ref Range Status  06/05/2013 0.59 (L) 0.80 - 1.40 mEq/L Final     No results found for: "PHENYTOIN", "PHENOBARB", "VALPROATE", "CBMZ"   .  res Assessment: Plan:    Bipolar I disorder with seasonal pattern ([place]) - Plan: lamoTRIgine (LAMICTAL) 150 MG tablet  Generalized anxiety disorder Mild cognitive complaints could possibly be medication side effects or unrelated- better with NAC  Mrs. Wardell has has not had significant mood cycling.  Her husband passed April 2021.  She has had a lot of support.  She is handling it well. Jehovah's [employer] faith has helped. .    30 min appt.  Overall she is doing better than usual.  She manages to function well physically and socially.   the sertraline has seemed to help with her irritability though she understands there is some risk of mood cycling from that medication.  At this time she feels like the benefits exceed the side effects of the current regimen. She's planning to move back To [place] most likely.  Disc bipolar dx and need for meds longterm.  Disc treatment plan and SE.  She has benfitted from [place] meds.  No change indicated Continue Equetro 900 mg HS Off Wellbutrin Dt panic. Continue sertraline 50 mg daily for panic Continue lamotrigine 150 mg 3 daily for depression Currently she is not using propranolol.  Adjusted to living alone.  Disc how to deal with paranoid friend.  FU 6 mos bc no med changes  Brittany Parents, MD, DFAPA   Please see After Visit Summary for patient specific  instructions.  Future Appointments  Date Time Provider [place]  04/07/2022 10:15 AM Juanito Doom, MD [facility] None    No orders of the defined types were placed in this encounter.      -------------------------------

## 2022-04-07 ENCOUNTER — Ambulatory Visit (INDEPENDENT_AMBULATORY_CARE_PROVIDER_SITE_OTHER): Payer: Medicare Other | Admitting: Pulmonary Disease

## 2022-04-07 ENCOUNTER — Encounter: Payer: Self-pay | Admitting: Pulmonary Disease

## 2022-04-07 VITALS — BP 104/68 | HR 70 | Ht 62.0 in | Wt 193.6 lb

## 2022-04-07 DIAGNOSIS — Z23 Encounter for immunization: Secondary | ICD-10-CM | POA: Diagnosis not present

## 2022-04-07 DIAGNOSIS — G4733 Obstructive sleep apnea (adult) (pediatric): Secondary | ICD-10-CM

## 2022-04-07 DIAGNOSIS — R0789 Other chest pain: Secondary | ICD-10-CM | POA: Diagnosis not present

## 2022-04-07 NOTE — Progress Notes (Signed)
Synopsis: Referred in August 2023 in the setting of dyspnea.  She has orthopnea and obstructive sleep apnea.  Subjective:   PATIENT ID: Brittany [name] GENDER: female DOB: Apr 27, 1956, MRN: [id number]   HPI  Chief Complaint  Patient presents with   Follow-up    Follow-up PT had sleep study since last OV, wants to talk about CPAP    Rudell says that her machine is working but it's old and she says taht it's "time for a new one".  She believes that she got it from the [place].   Her breathing has been "pretty good". However if she falls asleep without CPAP she'll wake up feeling shortness of breath. No pneumonia, no bronchitis.   She notes that she will occasionally have chest and right shoulder pain.  It occurs when sleeping, laying watching TV, sometimes when eating.  It is not predictable, doesn't occur with walking.    Past Medical History:  Diagnosis Date   ACNE NEC    ALLERGIC RHINITIS    ALOPECIA    Anal fissure    Anxiety    Panic Attack.  None in a long time   Arthritis    Bowel obstruction (HCC)    Chronic headaches    migraine- none in years   Constipation    drinks tea with senna   DEPRESSION    Depression    DISORDER, BIPOLAR NEC    Family history of breast cancer    Family history of pancreatic cancer    GERD (gastroesophageal reflux disease)    HEMORRHOIDS-INTERNAL    History of stress test    ETT 10/17: Ex 7'; no chest pain, no ST changes, Duke Treadmill Score 7   HYPERLIPIDEMIA    OBSTRUCTIVE SLEEP APNEA    Seasonal allergies        Review of Systems  Constitutional:  Negative for chills, fever, malaise/fatigue and weight loss.  HENT:  Negative for congestion, nosebleeds, sinus pain and sore throat.   Eyes:  Negative for photophobia, pain and discharge.  Respiratory:  Negative for cough, hemoptysis, sputum production, shortness of breath and wheezing.   Cardiovascular:  Negative for chest pain, palpitations, orthopnea and leg swelling.   Gastrointestinal:  Negative for abdominal pain, constipation, diarrhea, nausea and vomiting.  Genitourinary:  Negative for dysuria, frequency, hematuria and urgency.  Musculoskeletal:  Negative for back pain, joint pain, myalgias and neck pain.  Skin:  Negative for itching and rash.  Neurological:  Negative for tingling, tremors, sensory change, speech change, focal weakness, seizures, weakness and headaches.  Psychiatric/Behavioral:  Negative for memory loss, substance abuse and suicidal ideas. The patient is not nervous/anxious.       Objective:  Physical Exam   Vitals:   04/07/22 1012  BP: 104/68  Pulse: 70  SpO2: 99%  Weight: 193 lb 9.6 oz (87.8 kg)  Height: '5\' 2"'$  (1.575 m)   Gen: well appearing HENT: OP clear, neck supple PULM: CTA B, normal percussion CV: RRR, no mgr, trace edema GI: BS+, soft, nontender Derm: no cyanosis or rash Psyche: normal mood and affect    CBC    Component Value Date/Time   WBC 6.6 04/03/2016 1201   RBC 4.44 04/03/2016 1201   HGB 13.6 04/03/2016 1201   HCT 40.5 04/03/2016 1201   PLT 322.0 04/03/2016 1201   MCV 91.2 04/03/2016 1201   MCH 30.7 03/07/2014 0749   MCHC 33.7 04/03/2016 1201   RDW 13.3 04/03/2016 1201   LYMPHSABS 2.5 04/03/2016 1201  MONOABS 0.5 04/03/2016 1201   EOSABS 0.2 04/03/2016 1201   BASOSABS 0.0 04/03/2016 1201     Chest imaging: 2017 CT chest images independently reviewed showing partial, subsegmental atelectasis in the right middle lobe and right lower lobe 2023 CXR > personally reviewed> normal pulmonary parenchyma, normal cardiac silhouette,   PFT:  Labs: September 2023 BNP normal  Path:  Echo:  Heart Catheterization:  Sleep study: August 2023 CPAP titration study: CPAP titrated to 9 cm of water, on this lowest O2 saturation was 90%     Assessment & Plan:   Atypical chest pain  OSA (obstructive sleep apnea)  Discussion: This has been a stable interval for Sadiya.  The mild atelectasis  in the right middle and right lower lobe has resolved.  She has obstructive sleep apnea, CPAP titration showed that the optimal pressure for her is 9 cm of water.  She is requesting a cardiology visit.  She describes atypical chest pain.  Plan: Obstructive sleep apnea: New CPAP machine order has been sent, requires a [employer] approval CPAP 9 cm of water nightly No driving while sleepy Avoid caffeine after noon Sleep in a dark room, no TV or radio on  Preventative: Flu shot today  Atypical chest pain: Cardiology referral  Follow up in 1 year or sooner if needed    Current Outpatient Medications:    diclofenac sodium (VOLTAREN) 1 % GEL, Apply 2 g topically 4 (four) times daily., Disp: 100 g, Rfl: 1   EQUETRO 300 MG CP12, TAKE 3 CAPSULES IN THE EVENING AT BEDTIME, Disp: 270 capsule, Rfl: 3   lamoTRIgine (LAMICTAL) 150 MG tablet, TAKE 1 TABLET IN THE MORNING AND 2 TABLETS IN THE EVENING, Disp: 270 tablet, Rfl: 0   meloxicam (MOBIC) 15 MG tablet, Take 1 tablet (15 mg total) by mouth daily., Disp: 30 tablet, Rfl: 1   Multiple Vitamin (MULTIVITAMIN WITH MINERALS) TABS, Take 1 tablet by mouth daily., Disp: , Rfl:    omeprazole (PRILOSEC) 40 MG capsule, Take 40 mg by mouth daily., Disp: , Rfl:    saline (AYR) GEL, Place 1 Application into both nostrils every 6 (six) hours as needed., Disp: 42.3 g, Rfl: 0   tiZANidine (ZANAFLEX) 2 MG tablet, , Disp: , Rfl:    Vitamin Mixture (ESTER-C PO), Take 1,000 mg by mouth., Disp: , Rfl:

## 2022-04-07 NOTE — Patient Instructions (Signed)
Obstructive sleep apnea: New CPAP machine order has been sent, requires a [employer] approval CPAP 9 cm of water nightly No driving while sleepy Avoid caffeine after noon Sleep in a dark room, no TV or radio on   Preventative: Flu shot today   Atypical chest pain: Cardiology referral   Follow up in 1 year or sooner if needed

## 2022-04-18 ENCOUNTER — Encounter: Payer: Self-pay | Admitting: Gastroenterology

## 2022-04-26 ENCOUNTER — Telehealth: Payer: Self-pay | Admitting: Psychiatry

## 2022-04-26 NOTE — Telephone Encounter (Signed)
Pt called requesting CC send a referral to [facility] ASAP Fax # [phone]. She stated she had talked to you about these spells she had been having. Thought she had figured the cause out. Had been taking memory pills. Turns out that was not the issue. Call Pt [phone] if any questions.

## 2022-04-26 NOTE — Telephone Encounter (Signed)
LVM to rtc 

## 2022-04-28 ENCOUNTER — Telehealth: Payer: Self-pay | Admitting: Psychiatry

## 2022-04-28 NOTE — Telephone Encounter (Signed)
Lvm to rtc °

## 2022-04-28 NOTE — Telephone Encounter (Signed)
Brittany [name] has a question about her medications. Her phone number is [phone].

## 2022-04-28 NOTE — Telephone Encounter (Signed)
Pt stated she is taking Lamictal 150 mg 3 tabs daily and she believes she is overdosing.She is so dizzy that she can't walk.She takes meclizine from another dr and it does help.She is also having extreme headaches.She was out at [employer] and said she will call me back with more details.

## 2022-04-28 NOTE — Telephone Encounter (Signed)
Addition to above message.Pt looked up side effects to lamotrigine and the following came up: Clumsiness,severe dizziness,sleepiness,dry mouth,headaches,increased HR Pt stated she is experiencing all of this and also she read that there is an interaction with the sertraline.She has decreased lamictal to 1/2 tab TID.She stated when she was taking just 2 she did not have problems but she see's a benefit with her getting good sleep.Please advise.

## 2022-05-01 NOTE — Telephone Encounter (Signed)
The interaction between [employer] and sertraline is not clinically significant however, it does appear that Brittany [name] was having clumsiness and dizziness that could be related to the high dose of lamotrigine, particularly if those symptoms went away when Brittany [name] reduced the lamotrigine.  Carbamazepine or Equetro can also cause the side effect but we have not changed that dosage recently.  If the side effects resolved with the reduction in lamotrigine to a total of 1-1/2 tablets daily, then Brittany [name] can continue that current dose and no further changes need to be made.  If that does not solve the problem then call us back.

## 2022-05-01 NOTE — Telephone Encounter (Signed)
Pt stated she is doing a lot better with the dose reduction

## 2022-05-08 ENCOUNTER — Telehealth: Payer: Self-pay | Admitting: Pulmonary Disease

## 2022-05-23 ENCOUNTER — Ambulatory Visit: Payer: Medicare Other | Admitting: Psychiatry

## 2022-06-14 NOTE — Telephone Encounter (Signed)
Pt requesting her records be mailed to her so she can give it to the [place]. Informed patient I would mail out a medical release and we can mail her records to her once we get the completed form back. Release mailed to address on file.

## 2022-06-28 ENCOUNTER — Ambulatory Visit: Payer: TRICARE For Life (TFL) | Admitting: Psychiatry

## 2022-06-30 NOTE — Telephone Encounter (Signed)
Received medical records request in the mail- faxed over to medical records and interofficed it to medical records

## 2022-07-18 ENCOUNTER — Other Ambulatory Visit: Payer: Self-pay | Admitting: Psychiatry

## 2022-07-18 NOTE — Telephone Encounter (Signed)
Please schedule appt

## 2022-07-26 NOTE — Telephone Encounter (Signed)
Lvm for pt to call and schedule appt 

## 2022-09-06 DIAGNOSIS — J329 Chronic sinusitis, unspecified: Secondary | ICD-10-CM | POA: Insufficient documentation

## 2022-09-27 ENCOUNTER — Ambulatory Visit: Payer: Medicare Other | Admitting: Psychiatry

## 2022-09-28 ENCOUNTER — Ambulatory Visit: Payer: Medicare Other | Admitting: Behavioral Health

## 2022-10-17 ENCOUNTER — Ambulatory Visit: Payer: Medicare Other | Admitting: Behavioral Health

## 2022-10-18 ENCOUNTER — Encounter: Payer: Self-pay | Admitting: Psychiatry

## 2022-10-18 ENCOUNTER — Ambulatory Visit (INDEPENDENT_AMBULATORY_CARE_PROVIDER_SITE_OTHER): Payer: Medicare Other | Admitting: Psychiatry

## 2022-10-18 DIAGNOSIS — F411 Generalized anxiety disorder: Secondary | ICD-10-CM

## 2022-10-18 DIAGNOSIS — F5105 Insomnia due to other mental disorder: Secondary | ICD-10-CM | POA: Diagnosis not present

## 2022-10-18 DIAGNOSIS — F319 Bipolar disorder, unspecified: Secondary | ICD-10-CM | POA: Diagnosis not present

## 2022-10-18 MED ORDER — ARIPIPRAZOLE 5 MG PO TABS
5.0000 mg | ORAL_TABLET | Freq: Every day | ORAL | 0 refills | Status: DC
Start: 2022-10-18 — End: 2022-12-05

## 2022-10-18 MED ORDER — HYDROXYZINE HCL 50 MG PO TABS: 50.0000 mg | ORAL_TABLET | Freq: Three times a day (TID) | ORAL | 1 refills | Status: DC | PRN

## 2022-10-18 NOTE — Progress Notes (Signed)
Brittany [name] [id number] 11-Oct-1955 67 y.o.     Subjective:   Patient ID:  Brittany [name] is a 67 y.o. (DOB Apr 07, 1956) female.  Chief Complaint:  No chief complaint on file.   HPI Brittany [name] presents to the office today for follow-up of bipolar disorder.    seen Oct 25, 2018.  For complaints of cognition Wellbutrin was increased to 450 mg each morning.  This was felt to be safer than introducing stimulants.  seen October 2020, the following was noted: Called back a couple of weeks later and wanted to reduce the dosage of Wellbutrin back to 300 mg daily.  It made her feel bad physically on edge.   Isolation is managed..  Mood relatively.  OK.  Has back pain that interferes with normal function.  House is a mess bc can't do much.  Hurt her back in there [employer]. NAC has helped after a couple of months.  Also feels more mellow and nice.  Still problems with concentration.  Asks about meds to help with attention span. Asks about Adderall.  Intermittent problems with forgetfulness and other days she drops the ball. Ok working on self care with walking and watching caffeine and doesn't want to take more meds.  Still living in [place].   Overall mood is pretty good without swings or irritability generally.   No meds were changed.  10/22/2019 appointment, the following is noted: She continues N-acetylcysteine, Wellbutrin XL 300 mg daily, Equetro 600 mg nightly, lamotrigine 300 mg daily, sertraline 50 mg daily. Had to cancel bc H so sick and died 2019/10/17.  Haven't been alone.   Hasn't decided about where to live.  Will stay where she is for a year.  People are visiting.  [facility] will be in June.   Peaceful passing.  The family has been supportive.  His kids have been OK.   Sold the hunting cabin to his boys.   She feels good about how she handled things.  Insurance will pay off the bills and has no mortgage.    Occ periods of racing thoughts and periods of irrritability or  periods of isolation.  Overall though doing well.  Overall mood is pretty good without swings or irritability generally.   Patient reports stable mood and denies depressed or irritable moods.  Patient denies any recent difficulty with anxiety.  Patient denies difficulty with sleep initiation or maintenance with melatonin. Denies appetite disturbance.  Patient reports that energy and motivation have been good.  Patient denies any difficulty with concentration.  Patient denies any suicidal ideation.  Protects sleeep 8-4AM.  Melatonin occ.  Not tired unusually.  06/28/2020 appt noted: Had more anxiety and increased Equetro to 1 twice daily and continued sertraline 50 bc it can make her more hyper.   Very sensitive to alcohol so can't have much of it.   Stopped caffeine unless travels. Anxiety was bad and it is better now. H deceased and problems with his 2 sons but not his daughters. One son with good manners and the other one lacks. Lose phone.   Adjusting pretty well to being a widow.  Can sleep alone and feel OK about it.  More accepting of being alone.  He left her money.  Takes Prevagen and she can tell a difference.   Going to [employer] and will be baptized into the faith. No recent temper problems lately.   Tolerating meds.  Sleep better.  More energy with Prevagen. Still  back pain abu tdcan clean house.    12/30/20 appt noted: Getting along OK.  Having to expel H's son's from her property.  Will is supportive of her position.   Disc friend who is paranoid.  Still living in [place].   Recognizes pt had prior history of paranoia with prior manic ipisodes.  No longer has these sx. No problems with the meds. Patient reports stable mood and denies depressed or irritable moods.  Patient denies any recent difficulty with anxiety.  Patient denies difficulty with sleep initiation or maintenance. Denies appetite disturbance.  Patient reports that energy and motivation have been good.  Patient  denies any difficulty with concentration.  Patient denies any suicidal ideation. Satisfied with meds.  No problems with meds. Plan: no med changes  06/27/2021 appointment with the following noted: Takes all meds at night including Wellbutrin. Dogs wake her.  Don't feel safe without the dogs.  Asks if she can ever stop the meds.  Trying to move back to [place]. Not depressed but prone to anxiety and worry. Knows she should exercise. Plan: Cont meds.  No change indicated Continue Equetro 900 mg HS Continue Wellbutrin XL 300 mg every morning.  Option weaning. Defer. Continue sertraline 50 mg daily for panic Continue lamotrigine 150 mg 3 daily for depression Currently she is not using propranolol.  04/05/22 appt noted: Off Wellbutrin but taking other meds noted above.  Stopped bc anxiety. No panic lately. Increased NAC  and Prevagen and caused SE.  Had a fall twice in 8 weeks and was confused. Regular doses were fine and did help. Selling her house.  Haven't decided where yet. Consistent with other meds. Rx meclizine for dizziness. No other problems with meds.   Mood is OK. Plan: Cont meds.  No change indicated Continue Equetro 900 mg HS Off Wellbutrin Dt panic. Continue sertraline 50 mg daily for panic Continue lamotrigine 150 mg 3 daily for depression Currently she is not using propranolol.  10/18/22 appt noted: BC falling without explanation she weaned off all meds.  Figured out it was lamotrigine.  Hasn't falled since or had muscle jerks.   Was paranoid for ahwile but that resolved.  Had a lot of falls at night.  Mind wasn't clear and speech slurred and was dizzy.   Went off all meds in November and felt elated again.  Wondered if it was mania.  Cleaned house and did tasks.  If feels giddy then doesn't drive.   I think I might be ok on supplements.  %HTP 5 daily, SJW, NAC, vit B12, fish oil, MVI. Manages stress and possible  Socializes with [place] people mostly.  Without meds  has tendency to be irritable and hyperverbal. Remembering things better without meds.  Mind clearer without meds. Couldn't sleep off meds.  Called the [place].  Got Kazanjian sleep for 12 days and given hydroxyzine 50 mg HS working pretty well.  Only psych med.  Without hydroxyzine feels she might have panic. She didn't think to call [facility] for help. Started talking to sister who takes hydroxyzine.  Dog good for her mental health.  Past Psychiatric Medication Trials: naltrexone,  propranolol hydroxyzine 50 mg HS wellbutrin 450 SE, sertraline 50,  lamotrigine 300,  lithium,  Geodon,   Equetro, doxepin  Review of Systems:  Review of Systems  Constitutional:  Positive for unexpected weight change.  Cardiovascular:  Negative for chest pain and palpitations.  Musculoskeletal:  Positive for back pain, gait problem and neck pain.  Neurological:  Positive for dizziness. Negative for tremors.  Hematological:  Negative for adenopathy. Does not bruise/bleed easily.  Psychiatric/Behavioral:  Negative for agitation, behavioral problems, confusion, decreased concentration, dysphoric mood, hallucinations, self-injury, sleep disturbance and suicidal ideas. The patient is not nervous/anxious and is not hyperactive.    Still in physical therapy.  Getting nerve block for her back.  Medications: I have reviewed the patient's current medications.  Current Outpatient Medications  Medication Sig Dispense Refill   diclofenac sodium (VOLTAREN) 1 % GEL Apply 2 g topically 4 (four) times daily. 100 g 1   EQUETRO 300 MG CP12 TAKE 3 CAPSULES IN THE EVENING AT BEDTIME 270 capsule 3   lamoTRIgine (LAMICTAL) 150 MG tablet TAKE 1 TABLET IN THE MORNING AND 2 TABLETS IN THE EVENING 270 tablet 0   meloxicam (MOBIC) 15 MG tablet Take 1 tablet (15 mg total) by mouth daily. 30 tablet 1   Multiple Vitamin (MULTIVITAMIN WITH MINERALS) TABS Take 1 tablet by mouth daily.     omeprazole (PRILOSEC) 40 MG capsule Take 40 mg by mouth  daily.     saline (AYR) GEL Place 1 Application into both nostrils every 6 (six) hours as needed. 42.3 g 0   sertraline (ZOLOFT) 50 MG tablet TAKE 1 TABLET EVERY MORNING 90 tablet 0   tiZANidine (ZANAFLEX) 2 MG tablet      Vitamin Mixture (ESTER-C PO) Take 1,000 mg by mouth.     No current facility-administered medications for this visit.    Medication Side Effects: None  Allergies:  Allergies  Allergen Reactions   Flexeril [Cyclobenzaprine]     Unable to leave home when taking as she is incoherent/unsure where she is at times   Hydrocodone     REACTION: GI upset   Motrin [Ibuprofen]    Robaxin [Methocarbamol]     Impairs judgement    Past Medical History:  Diagnosis Date   ACNE NEC    ALLERGIC RHINITIS    ALOPECIA    Anal fissure    Anxiety    Panic Attack.  None in a long time   Arthritis    Bowel obstruction (HCC)    Chronic headaches    migraine- none in years   Constipation    drinks tea with senna   DEPRESSION    Depression    DISORDER, BIPOLAR NEC    Family history of breast cancer    Family history of pancreatic cancer    GERD (gastroesophageal reflux disease)    HEMORRHOIDS-INTERNAL    History of stress test    ETT 10/17: Ex 7'; no chest pain, no ST changes, [facility] Treadmill Score 7   HYPERLIPIDEMIA    OBSTRUCTIVE SLEEP APNEA    Seasonal allergies     Family History  Problem Relation Age of Onset   Other Paternal Aunt        x 2, blood clotting disorder   Breast cancer Paternal Aunt 45   Heart disease Sister    Kidney disease Maternal Uncle    Esophageal cancer Paternal Uncle    Pancreatic cancer Paternal Uncle    COPD Father    Breast cancer Mother 4   Breast cancer Maternal Grandmother 45   Stroke Maternal Grandfather    Aneurysm Paternal Grandmother        brain   Hypertension Brother    Breast cancer Maternal Aunt        dx in her 32s   Breast cancer Maternal Aunt  dx in her 40s   Cancer Maternal Uncle        NOS   Cancer  Maternal Uncle        tumor on his head   Breast cancer Paternal Aunt        dx in her 63s   Breast cancer Paternal Aunt        dx in her 3s   Lung cancer Paternal Uncle    Breast cancer Cousin 87       paternal first cousin   Colon cancer Maternal Uncle    Colon polyps Maternal Uncle    Irritable bowel syndrome Cousin    Rectal cancer Neg Hx    Stomach cancer Neg Hx     Social History   Socioeconomic History   Marital status: Married    Spouse name: Not on file   Number of children: 4   Years of education: Not on file   Highest education level: Not on file  Occupational History   Occupation: disabled    Employer: UNEMPLOYED  Tobacco Use   Smoking status: Former    Packs/day: 0.10    Years: 7.00    Additional pack years: 0.00    Total pack years: 0.70    Types: Cigarettes    Quit date: 06/19/1981    Years since quitting: 41.3   Smokeless tobacco: Never  Substance and Sexual Activity   Alcohol use: No    Comment: rare   Drug use: No   Sexual activity: Not on file  Other Topics Concern   Not on file  Social History Narrative   Not on file   Social Determinants of Health   Financial Resource Strain: Not on file  Food Insecurity: Not on file  Transportation Needs: Not on file  Physical Activity: Not on file  Stress: Not on file  Social Connections: Not on file  Intimate Partner Violence: Not on file    Past Medical History, Surgical history, Social history, and Family history were reviewed and updated as appropriate.   Please see review of systems for further details on the patient's review from today.   Objective:   Physical Exam:  There were no vitals taken for this visit.  Physical Exam Constitutional:      General: She is not in acute distress.    Appearance: Normal appearance. She is obese.  Musculoskeletal:        General: No deformity.  Neurological:     Mental Status: She is alert and oriented to person, place, and time.     Coordination:  Coordination normal.     Comments: Using cane  Psychiatric:        Attention and Perception: Attention and perception normal.        Mood and Affect: Mood is not anxious or depressed. Affect is not labile, blunt, angry or inappropriate.        Speech: Speech normal.        Behavior: Behavior normal. Behavior is not slowed.        Thought Content: Thought content normal. Thought content is not delusional. Thought content does not include homicidal or suicidal ideation. Thought content does not include suicidal plan.        Cognition and Memory: Cognition normal.        Judgment: Judgment normal.     Comments: Insight intact.  Occ irritable. No auditory or visual hallucinations. No delusions.  Some stress.     Lab Review:  Component Value Date/Time   NA 140 01/26/2017 1030   K 4.1 01/26/2017 1030   CL 108 01/26/2017 1030   CO2 28 01/26/2017 1030   GLUCOSE 94 01/26/2017 1030   BUN 7 01/26/2017 1030   CREATININE 0.60 10/17/2017 0727   CALCIUM 10.2 01/26/2017 1030   CALCIUM 10.0 01/26/2017 1030   PROT 7.3 01/26/2017 1030   ALBUMIN 4.4 01/26/2017 1030   AST 26 01/26/2017 1030   ALT 52 (H) 01/26/2017 1030   ALKPHOS 117 01/26/2017 1030   BILITOT 0.3 01/26/2017 1030   GFRNONAA >90 03/07/2014 0749   GFRAA >90 03/07/2014 0749       Component Value Date/Time   WBC 6.6 04/03/2016 1201   RBC 4.44 04/03/2016 1201   HGB 13.6 04/03/2016 1201   HCT 40.5 04/03/2016 1201   PLT 322.0 04/03/2016 1201   MCV 91.2 04/03/2016 1201   MCH 30.7 03/07/2014 0749   MCHC 33.7 04/03/2016 1201   RDW 13.3 04/03/2016 1201   LYMPHSABS 2.5 04/03/2016 1201   MONOABS 0.5 04/03/2016 1201   EOSABS 0.2 04/03/2016 1201   BASOSABS 0.0 04/03/2016 1201    Lithium Lvl  Date Value Ref Range Status  06/05/2013 0.59 (L) 0.80 - 1.40 mEq/L Final     No results found for: "PHENYTOIN", "PHENOBARB", "VALPROATE", "CBMZ"   .res Assessment: Plan:    No diagnosis found. Greater than 50% of 45 min face to  face time with patient was spent on counseling and coordination of care. We discussed Mild cognitive complaints could possibly be medication side effects or unrelated- better with NAC but not resolved until stopped meds.  Mrs. Tomassetti has has not had significant mood cycling.  Her husband passed April 2021.  She has had a lot of support.  She is handling it well. Jehovah's [employer] faith has helped. Overall she is doing better than usual.  She manages to function well physically and socially.   the sertraline has seemed to help with her irritability though she understands there is some risk of mood cycling from that medication.  At this time she feels like the benefits exceed the side effects of the current regimen. She's planning to move back To [place] most likely.  Disc bipolar dx and need for meds longterm.  Disc treatment plan and SE.  She has benfitted from NAC  She is against Depakote bc fear of it affecting liver. Disc category of atypicals for mood stabilization bc no other options otherwise.  Discussed potential metabolic side effects associated with atypical antipsychotics, as well as potential risk for movement side effects. Advised pt to contact office if movement side effects occur.  Loowest SE generic esp low cognitive risk Abilify .  She wants to try low dose 5 mg daily.  Currently she is not using propranolol.  Adjusted to living alone.  Disc how to deal with paranoid friend.  FU 6 mos bc no med changes  Meredith Staggers, MD, DFAPA   Please see After Visit Summary for patient specific instructions.  Future Appointments  Date Time Provider Department Center  12/05/2022  8:20 AM Suezanne Jacquet, Vickie L, NP-C [facility] None    No orders of the defined types were placed in this encounter.      -------------------------------

## 2022-12-05 ENCOUNTER — Ambulatory Visit (INDEPENDENT_AMBULATORY_CARE_PROVIDER_SITE_OTHER): Payer: Medicare Other | Admitting: Family Medicine

## 2022-12-05 ENCOUNTER — Ambulatory Visit (INDEPENDENT_AMBULATORY_CARE_PROVIDER_SITE_OTHER): Payer: Medicare Other | Admitting: Psychiatry

## 2022-12-05 ENCOUNTER — Encounter: Payer: Self-pay | Admitting: Family Medicine

## 2022-12-05 ENCOUNTER — Encounter: Payer: Self-pay | Admitting: Psychiatry

## 2022-12-05 VITALS — BP 118/70 | HR 72 | Temp 97.6°F | Ht 62.0 in | Wt 182.0 lb

## 2022-12-05 DIAGNOSIS — Z1211 Encounter for screening for malignant neoplasm of colon: Secondary | ICD-10-CM

## 2022-12-05 DIAGNOSIS — L811 Chloasma: Secondary | ICD-10-CM | POA: Insufficient documentation

## 2022-12-05 DIAGNOSIS — F5105 Insomnia due to other mental disorder: Secondary | ICD-10-CM

## 2022-12-05 DIAGNOSIS — G4700 Insomnia, unspecified: Secondary | ICD-10-CM | POA: Insufficient documentation

## 2022-12-05 DIAGNOSIS — H269 Unspecified cataract: Secondary | ICD-10-CM | POA: Insufficient documentation

## 2022-12-05 DIAGNOSIS — E785 Hyperlipidemia, unspecified: Secondary | ICD-10-CM | POA: Diagnosis not present

## 2022-12-05 DIAGNOSIS — F3189 Other bipolar disorder: Secondary | ICD-10-CM

## 2022-12-05 DIAGNOSIS — M722 Plantar fascial fibromatosis: Secondary | ICD-10-CM | POA: Insufficient documentation

## 2022-12-05 DIAGNOSIS — E669 Obesity, unspecified: Secondary | ICD-10-CM

## 2022-12-05 DIAGNOSIS — G4733 Obstructive sleep apnea (adult) (pediatric): Secondary | ICD-10-CM

## 2022-12-05 DIAGNOSIS — F419 Anxiety disorder, unspecified: Secondary | ICD-10-CM | POA: Insufficient documentation

## 2022-12-05 DIAGNOSIS — Z87898 Personal history of other specified conditions: Secondary | ICD-10-CM | POA: Diagnosis not present

## 2022-12-05 DIAGNOSIS — R32 Unspecified urinary incontinence: Secondary | ICD-10-CM | POA: Insufficient documentation

## 2022-12-05 DIAGNOSIS — F411 Generalized anxiety disorder: Secondary | ICD-10-CM

## 2022-12-05 DIAGNOSIS — R42 Dizziness and giddiness: Secondary | ICD-10-CM | POA: Insufficient documentation

## 2022-12-05 DIAGNOSIS — L853 Xerosis cutis: Secondary | ICD-10-CM | POA: Insufficient documentation

## 2022-12-05 DIAGNOSIS — K529 Noninfective gastroenteritis and colitis, unspecified: Secondary | ICD-10-CM | POA: Diagnosis not present

## 2022-12-05 DIAGNOSIS — Z7689 Persons encountering health services in other specified circumstances: Secondary | ICD-10-CM

## 2022-12-05 DIAGNOSIS — N63 Unspecified lump in unspecified breast: Secondary | ICD-10-CM

## 2022-12-05 DIAGNOSIS — R3121 Asymptomatic microscopic hematuria: Secondary | ICD-10-CM | POA: Insufficient documentation

## 2022-12-05 DIAGNOSIS — E041 Nontoxic single thyroid nodule: Secondary | ICD-10-CM | POA: Insufficient documentation

## 2022-12-05 DIAGNOSIS — R229 Localized swelling, mass and lump, unspecified: Secondary | ICD-10-CM

## 2022-12-05 DIAGNOSIS — L910 Hypertrophic scar: Secondary | ICD-10-CM | POA: Insufficient documentation

## 2022-12-05 DIAGNOSIS — F319 Bipolar disorder, unspecified: Secondary | ICD-10-CM | POA: Diagnosis not present

## 2022-12-05 DIAGNOSIS — G8929 Other chronic pain: Secondary | ICD-10-CM

## 2022-12-05 DIAGNOSIS — G894 Chronic pain syndrome: Secondary | ICD-10-CM | POA: Insufficient documentation

## 2022-12-05 DIAGNOSIS — N281 Cyst of kidney, acquired: Secondary | ICD-10-CM | POA: Insufficient documentation

## 2022-12-05 DIAGNOSIS — M545 Low back pain, unspecified: Secondary | ICD-10-CM | POA: Diagnosis not present

## 2022-12-05 HISTORY — DX: Unspecified lump in unspecified breast: N63.0

## 2022-12-05 HISTORY — DX: Asymptomatic microscopic hematuria: R31.21

## 2022-12-05 LAB — CBC WITH DIFFERENTIAL/PLATELET
Basophils Absolute: 0 10*3/uL (ref 0.0–0.1)
Basophils Relative: 0.7 % (ref 0.0–3.0)
Eosinophils Absolute: 0.1 10*3/uL (ref 0.0–0.7)
Eosinophils Relative: 1.4 % (ref 0.0–5.0)
HCT: 39.4 % (ref 36.0–46.0)
Hemoglobin: 13 g/dL (ref 12.0–15.0)
Lymphocytes Relative: 34.3 % (ref 12.0–46.0)
Lymphs Abs: 2 10*3/uL (ref 0.7–4.0)
MCHC: 32.9 g/dL (ref 30.0–36.0)
MCV: 90.5 fl (ref 78.0–100.0)
Monocytes Absolute: 0.4 10*3/uL (ref 0.1–1.0)
Monocytes Relative: 7.1 % (ref 3.0–12.0)
Neutro Abs: 3.4 10*3/uL (ref 1.4–7.7)
Neutrophils Relative %: 56.5 % (ref 43.0–77.0)
Platelets: 324 10*3/uL (ref 150.0–400.0)
RBC: 4.36 Mil/uL (ref 3.87–5.11)
RDW: 14.3 % (ref 11.5–15.5)
WBC: 5.9 10*3/uL (ref 4.0–10.5)

## 2022-12-05 LAB — COMPREHENSIVE METABOLIC PANEL
ALT: 19 U/L (ref 0–35)
AST: 21 U/L (ref 0–37)
Albumin: 3.9 g/dL (ref 3.5–5.2)
Alkaline Phosphatase: 102 U/L (ref 39–117)
BUN: 9 mg/dL (ref 6–23)
CO2: 27 mEq/L (ref 19–32)
Calcium: 10 mg/dL (ref 8.4–10.5)
Chloride: 108 mEq/L (ref 96–112)
Creatinine, Ser: 0.62 mg/dL (ref 0.40–1.20)
GFR: 93.34 mL/min (ref >60.00)
Glucose, Bld: 96 mg/dL (ref 70–99)
Potassium: 4.2 mEq/L (ref 3.5–5.1)
Sodium: 140 mEq/L (ref 135–145)
Total Bilirubin: 0.6 mg/dL (ref 0.2–1.2)
Total Protein: 7 g/dL (ref 6.0–8.3)

## 2022-12-05 LAB — LIPID PANEL
Cholesterol: 170 mg/dL (ref 0–200)
HDL: 56.9 mg/dL (ref >39.00)
LDL Cholesterol: 95 mg/dL (ref 0–99)
NonHDL: 113.31
Total CHOL/HDL Ratio: 3
Triglycerides: 94 mg/dL (ref 0.0–149.0)
VLDL: 18.8 mg/dL (ref 0.0–40.0)

## 2022-12-05 MED ORDER — CLONAZEPAM 0.5 MG PO TABS
0.5000 mg | ORAL_TABLET | Freq: Every day | ORAL | 0 refills | Status: DC
Start: 2022-12-05 — End: 2023-02-21

## 2022-12-05 NOTE — Progress Notes (Signed)
New Patient Office Visit  Subjective    Patient ID: Brittany [name], female    DOB: [birth year]  Age: 67 y.o. MRN: [id number]  CC:  Chief Complaint  Patient presents with   Establish Care    Needs cardiologist and dermatologist referral and colonoscopy. Also wants PT for spinal injury in [employer]. 50% disabled     HPI Brittany [name] presents to establish care Moved here from [place], [place] last week   Her psychiatrist is here in [place].  Dr. Jennelle Human on [facility] [place].   Bipolar- states she stopped Abilify due to feeling clumsy.  Took lamotrigine in the past but had side effects.   She used to the [facility] in [place] and will re-establish with them.   PCP here was in [facility]- Dr. Matthias Hughs  States she is overdue for colonoscopy.   C/o chronic diarrhea x 1 year. Reports being overdue for   States she saw someone with [facility] pulmonology in 05-02-2022.   Her husband passed away in 11-01-19  She saw dermatologist in [place]. She has a nodule on her face that she wants removed.     Outpatient Encounter Medications as of 12/05/2022  Medication Sig   diclofenac sodium (VOLTAREN) 1 % GEL Apply 2 g topically 4 (four) times daily.   meloxicam (MOBIC) 15 MG tablet Take 1 tablet (15 mg total) by mouth daily. (Patient not taking: Reported on 12/05/2022)   Multiple Vitamin (MULTIVITAMIN WITH MINERALS) TABS Take 1 tablet by mouth daily.   saline (AYR) GEL Place 1 Application into both nostrils every 6 (six) hours as needed.   tiZANidine (ZANAFLEX) 2 MG tablet  (Patient not taking: Reported on 12/05/2022)   Vitamin Mixture (ESTER-C PO) Take 1,000 mg by mouth.   [DISCONTINUED] hydrOXYzine (ATARAX) 50 MG tablet Take 1 tablet (50 mg total) by mouth 3 (three) times daily as needed. (Patient not taking: Reported on 12/05/2022)   [DISCONTINUED] ARIPiprazole (ABILIFY) 5 MG tablet Take 1 tablet (5 mg total) by mouth daily. (Patient not taking: Reported on  12/05/2022)   [DISCONTINUED] EQUETRO 300 MG CP12 TAKE 3 CAPSULES IN THE EVENING AT BEDTIME (Patient not taking: Reported on 10/18/2022)   [DISCONTINUED] lamoTRIgine (LAMICTAL) 150 MG tablet TAKE 1 TABLET IN THE MORNING AND 2 TABLETS IN THE EVENING (Patient not taking: Reported on 10/18/2022)   [DISCONTINUED] omeprazole (PRILOSEC) 40 MG capsule Take 40 mg by mouth daily. (Patient not taking: Reported on 12/05/2022)   [DISCONTINUED] sertraline (ZOLOFT) 50 MG tablet TAKE 1 TABLET EVERY MORNING (Patient not taking: Reported on 10/18/2022)   No facility-administered encounter medications on file as of 12/05/2022.    Past Medical History:  Diagnosis Date   ACNE NEC    ALLERGIC RHINITIS    ALOPECIA    Anal fissure    Anxiety    Panic Attack.  None in a long time   Arthritis    Asymptomatic microscopic hematuria 12/05/2022   Bowel obstruction (HCC)    Breast mass 12/05/2022   Sep 04, 2008 Entered By: Bennie Pierini B Comment: benign on bx   Chronic headaches    migraine- none in years   Constipation    drinks tea with senna   DEPRESSION    Depression    DISORDER, BIPOLAR NEC    Family history of breast cancer    Family history of pancreatic cancer    GERD (gastroesophageal reflux disease)    HEMORRHOIDS-INTERNAL    History of stress  test    ETT 10/17: Ex 7'; no chest pain, no ST changes, [facility] Treadmill Score 7   HYPERLIPIDEMIA    OBSTRUCTIVE SLEEP APNEA    Seasonal allergies     Past Surgical History:  Procedure Laterality Date   BREAST LUMPECTOMY Right last done 2005   x 2   CHOLECYSTECTOMY N/A 03/07/2014   Procedure: LAPAROSCOPIC CHOLECYSTECTOMY WITH INTRAOPERATIVE CHOLANGIOGRAM;  Surgeon: Claud Kelp, MD;  Location[facility];  Service: General;  Laterality: N/A;   EUS N/A 01/27/2014   Procedure: ESOPHAGEAL ENDOSCOPIC ULTRASOUND (EUS) RADIAL;  Surgeon: Willis Modena, MD;  Location: [facility] END[facility]COPY;  Service: Endoscopy;  Laterality: N/A;   HAND SURGERY Bilateral    for arthritis  left  02/02/14- right approx 2014   KNEE SURGERY Bilateral    right x 2 left x 1.  arthroscopy   RADICAL HYSTERECTOMY     thumb and pinkie finger surgery Right 2014   TONSILLECTOMY      Family History  Problem Relation Age of Onset   Other Paternal Aunt        x 2, blood clotting disorder   Breast cancer Paternal Aunt 41   Heart disease Sister    Kidney disease Maternal Uncle    Esophageal cancer Paternal Uncle    Pancreatic cancer Paternal Uncle    COPD Father    Breast cancer Mother 65   Breast cancer Maternal Grandmother 45   Stroke Maternal Grandfather    Aneurysm Paternal Grandmother        brain   Hypertension Brother    Breast cancer Maternal Aunt        dx in her 45s   Breast cancer Maternal Aunt        dx in her 21s   Cancer Maternal Uncle        NOS   Cancer Maternal Uncle        tumor on his head   Breast cancer Paternal Aunt        dx in her 72s   Breast cancer Paternal Aunt        dx in her 19s   Lung cancer Paternal Uncle    Breast cancer Cousin 62       paternal first cousin   Colon cancer Maternal Uncle    Colon polyps Maternal Uncle    Irritable bowel syndrome Cousin    Rectal cancer Neg Hx    Stomach cancer Neg Hx     Social History   Socioeconomic History   Marital status: Married    Spouse name: Not on file   Number of children: 4   Years of education: Not on file   Highest education level: Not on file  Occupational History   Occupation: disabled    Employer: UNEMPLOYED  Tobacco Use   Smoking status: Former    Packs/day: 0.10    Years: 7.00    Additional pack years: 0.00    Total pack years: 0.70    Types: Cigarettes    Quit date: 06/19/1981    Years since quitting: 41.4   Smokeless tobacco: Never  Substance and Sexual Activity   Alcohol use: No    Comment: rare   Drug use: No   Sexual activity: Not on file  Other Topics Concern   Not on file  Social History Narrative   Not on file   Social Determinants of Health   Financial  Resource Strain: Not on file  Food Insecurity: Not on file  Transportation Needs: Not on file  Physical Activity: Not on file  Stress: Not on file  Social Connections: Not on file  Intimate Partner Violence: Not on file    Review of Systems  Constitutional:  Negative for chills and fever.  Respiratory:  Negative for shortness of breath.   Cardiovascular:  Negative for chest pain, palpitations and leg swelling.  Gastrointestinal:  Positive for diarrhea. Negative for abdominal pain, constipation, nausea and vomiting.  Genitourinary:  Negative for dysuria, frequency and urgency.  Musculoskeletal:  Positive for back pain.  Neurological:  Negative for dizziness.        Objective    BP 118/70 (BP Location: Left Arm, Patient Position: Sitting, Cuff Size: Large)   Pulse 72   Temp 97.6 F (36.4 C) (Temporal)   Ht 5\' 2"  (1.575 m)   Wt 182 lb (82.6 kg)   SpO2 98%   BMI 33.29 kg/m   Physical Exam Constitutional:      General: She is not in acute distress.    Appearance: She is not ill-appearing.  Eyes:     Extraocular Movements: Extraocular movements intact.     Conjunctiva/sclera: Conjunctivae normal.  Cardiovascular:     Rate and Rhythm: Normal rate and regular rhythm.  Pulmonary:     Effort: Pulmonary effort is normal.     Breath sounds: Normal breath sounds.  Musculoskeletal:     Cervical back: Normal range of motion and neck supple.  Skin:    General: Skin is warm and dry.     Comments: Nodule, flesh toned, to left cheek  Neurological:     General: No focal deficit present.     Mental Status: She is alert and oriented to person, place, and time.  Psychiatric:        Mood and Affect: Mood normal.        Behavior: Behavior normal.        Thought Content: Thought content normal.         Assessment & Plan:   Problem List Items Addressed This Visit       Other   Chronic back pain   DISORDER, BIPOLAR NEC   Hyperlipidemia LDL goal <100   Relevant Orders    Lipid panel (Completed)   Other Visit Diagnoses     Chronic diarrhea    -  Primary   Relevant Orders   Ambulatory referral to Gastroenterology   CBC with Differential/Platelet (Completed)   Comprehensive metabolic panel (Completed)   GI Profile, Stool, PCR   Encounter to establish care       OSA on CPAP       History of abnormal mammogram       Mass of skin       Relevant Orders   Ambulatory referral to Dermatology   Screen for colon cancer       Relevant Orders   Ambulatory referral to Gastroenterology   Obesity (BMI 30-39.9)       Relevant Orders   CBC with Differential/Platelet (Completed)   Comprehensive metabolic panel (Completed)   Lipid panel (Completed)      Here to establish care.  No previous medical records available except for [facility] pulmonology. She sees them for OSA and reports using CPAP.  She is re-establishing with [facility], previously in [place].  Referral to GI - overdue for colonoscopy per patient. I will request her previous colonoscopy and other test results. Diarrhea x 1 year- check labs and stool studies.  Referral to dermatology  for nodule on face that she would like to have removed.  Continue seeing Dr. Jennelle Human for bipolar disorder.  Reports hx of breast MRI and is due for report. I will request these records.  F/u pending labs and in 4 wks.   Return in about 4 weeks (around 01/02/2023).   Hetty Blend, NP-C

## 2022-12-05 NOTE — Progress Notes (Signed)
Brittany [name] [id number] 11/28/1955 67 y.o.     Subjective:   Patient ID:  Brittany [name] is a 67 y.o. (DOB May 17, 1956) female.  Chief Complaint:  Chief Complaint  Patient presents with   Follow-up    Mood and sleep    HPI Brittany [name] presents to the office today for follow-up of bipolar disorder.    seen Oct 25, 2018.  For complaints of cognition Wellbutrin was increased to 450 mg each morning.  This was felt to be safer than introducing stimulants.  seen October 2020, the following was noted: Called back a couple of weeks later and wanted to reduce the dosage of Wellbutrin back to 300 mg daily.  It made her feel bad physically on edge.   Isolation is managed..  Mood relatively.  OK.  Has back pain that interferes with normal function.  House is a mess bc can't do much.  Hurt her back in there [employer]. NAC has helped after a couple of months.  Also feels more mellow and nice.  Still problems with concentration.  Asks about meds to help with attention span. Asks about Adderall.  Intermittent problems with forgetfulness and other days she drops the ball. Ok working on self care with walking and watching caffeine and doesn't want to take more meds.  Still living in [place].   Overall mood is pretty good without swings or irritability generally.   No meds were changed.  10/22/2019 appointment, the following is noted: She continues N-acetylcysteine, Wellbutrin XL 300 mg daily, Equetro 600 mg nightly, lamotrigine 300 mg daily, sertraline 50 mg daily. Had to cancel bc H so sick and died Oct 15, 2019.  Haven't been alone.   Hasn't decided about where to live.  Will stay where she is for a year.  People are visiting.  [facility] will be in June.   Peaceful passing.  The family has been supportive.  His kids have been OK.   Sold the hunting cabin to his boys.   She feels good about how she handled things.  Insurance will pay off the bills and has no mortgage.    Occ periods of  racing thoughts and periods of irrritability or periods of isolation.  Overall though doing well.  Overall mood is pretty good without swings or irritability generally.   Patient reports stable mood and denies depressed or irritable moods.  Patient denies any recent difficulty with anxiety.  Patient denies difficulty with sleep initiation or maintenance with melatonin. Denies appetite disturbance.  Patient reports that energy and motivation have been good.  Patient denies any difficulty with concentration.  Patient denies any suicidal ideation.  Protects sleeep 8-4AM.  Melatonin occ.  Not tired unusually.  06/28/2020 appt noted: Had more anxiety and increased Equetro to 1 twice daily and continued sertraline 50 bc it can make her more hyper.   Very sensitive to alcohol so can't have much of it.   Stopped caffeine unless travels. Anxiety was bad and it is better now. H deceased and problems with his 2 sons but not his daughters. One son with good manners and the other one lacks. Lose phone.   Adjusting pretty well to being a widow.  Can sleep alone and feel OK about it.  More accepting of being alone.  He left her money.  Takes Prevagen and she can tell a difference.   Going to [employer] and will be baptized into the faith. No recent temper problems lately.  Tolerating meds.  Sleep better.  More energy with Prevagen. Still back pain abu tdcan clean house.    12/30/20 appt noted: Getting along OK.  Having to expel H's son's from her property.  Will is supportive of her position.   Disc friend who is paranoid.  Still living in [place].   Recognizes pt had prior history of paranoia with prior manic ipisodes.  No longer has these sx. No problems with the meds. Patient reports stable mood and denies depressed or irritable moods.  Patient denies any recent difficulty with anxiety.  Patient denies difficulty with sleep initiation or maintenance. Denies appetite disturbance.  Patient reports that  energy and motivation have been good.  Patient denies any difficulty with concentration.  Patient denies any suicidal ideation. Satisfied with meds.  No problems with meds. Plan: no med changes  06/27/2021 appointment with the following noted: Takes all meds at night including Wellbutrin. Dogs wake her.  Don't feel safe without the dogs.  Asks if she can ever stop the meds.  Trying to move back to [place]. Not depressed but prone to anxiety and worry. Knows she should exercise. Plan: Cont meds.  No change indicated Continue Equetro 900 mg HS Continue Wellbutrin XL 300 mg every morning.  Option weaning. Defer. Continue sertraline 50 mg daily for panic Continue lamotrigine 150 mg 3 daily for depression Currently she is not using propranolol.  04/05/22 appt noted: Off Wellbutrin but taking other meds noted above.  Stopped bc anxiety. No panic lately. Increased NAC  and Prevagen and caused SE.  Had a fall twice in 8 weeks and was confused. Regular doses were fine and did help. Selling her house.  Haven't decided where yet. Consistent with other meds. Rx meclizine for dizziness. No other problems with meds.   Mood is OK. Plan: Cont meds.  No change indicated Continue Equetro 900 mg HS Off Wellbutrin Dt panic. Continue sertraline 50 mg daily for panic Continue lamotrigine 150 mg 3 daily for depression Currently she is not using propranolol.  10/18/22 appt noted: BC falling without explanation she weaned off all meds.  Figured out it was lamotrigine.  Hasn't falled since or had muscle jerks.   Was paranoid for ahwile but that resolved.  Had a lot of falls at night.  Mind wasn't clear and speech slurred and was dizzy.   Went off all meds in November and felt elated again.  Wondered if it was mania.  Cleaned house and did tasks.  If feels giddy then doesn't drive.   I think I might be ok on supplements.  %HTP 5 daily, SJW, NAC, vit B12, fish oil, MVI. Manages stress and possible  Socializes  with [place] people mostly.  Without meds has tendency to be irritable and hyperverbal. Remembering things better without meds.  Mind clearer without meds. Couldn't sleep off meds.  Called the [place].  Got Ramaker sleep for 12 days and given hydroxyzine 50 mg HS working pretty well.  Only psych med.  Without hydroxyzine feels she might have panic. She didn't think to call [facility] for help. Started talking to sister who takes hydroxyzine. Plan: Loowest SE generic esp low cognitive risk Abilify .  She wants to try low dose 5 mg daily.  12/05/22 appt noted: Took Abilify for awhile and then started getting clumsy and stopped it. She thinks after 30 years of using med can't seem to take anything.  She doesn't think even low doses of lamotrigine she could take.  Asks  for something that is not for bipolar to tx her off label. Sleep at most 4 hours usually.  Both initial and terminal ins.  Dogs can interfere.   Not trying to be noncompliant but fearful of meds bc she is alone.   Worries Depakote affects thyroid. Exhausted from not sleeping and stressors from moving.   Thinks the next door neighbor is harrassing her shining lights in window at night.  Sometimes panic and sometimes fear at night.  Has big dog so does not fear being attacked.  Plans security system.    Dog good for her mental health.  Past Psychiatric Medication Trials:  naltrexone,  propranolol hydroxyzine 50 mg HS wellbutrin 450 SE, sertraline 50,  lamotrigine 300,  lithium,  Geodon,   Equetro, Abilify 5 complaining of clumsy Melatonin hangover at 10 mg  doxepin  Review of Systems:  Review of Systems  Constitutional:  Positive for unexpected weight change.  Cardiovascular:  Negative for chest pain and palpitations.  Musculoskeletal:  Positive for back pain, gait problem and neck pain.  Neurological:  Positive for dizziness. Negative for tremors.  Hematological:  Negative for adenopathy. Does not bruise/bleed easily.   Psychiatric/Behavioral:  Positive for sleep disturbance. Negative for agitation, behavioral problems, confusion, decreased concentration, dysphoric mood, hallucinations, self-injury and suicidal ideas. The patient is nervous/anxious. The patient is not hyperactive.    Still in physical therapy.  Getting nerve block for her back.  Medications: I have reviewed the patient's current medications.  Current Outpatient Medications  Medication Sig Dispense Refill   clonazePAM (KLONOPIN) 0.5 MG tablet Take 1-2 tablets (0.5-1 mg total) by mouth at bedtime. 60 tablet 0   Multiple Vitamin (MULTIVITAMIN WITH MINERALS) TABS Take 1 tablet by mouth daily.     saline (AYR) GEL Place 1 Application into both nostrils every 6 (six) hours as needed. 42.3 g 0   diclofenac sodium (VOLTAREN) 1 % GEL Apply 2 g topically 4 (four) times daily. 100 g 1   meloxicam (MOBIC) 15 MG tablet Take 1 tablet (15 mg total) by mouth daily. (Patient not taking: Reported on 12/05/2022) 30 tablet 1   tiZANidine (ZANAFLEX) 2 MG tablet  (Patient not taking: Reported on 12/05/2022)     Vitamin Mixture (ESTER-C PO) Take 1,000 mg by mouth.     No current facility-administered medications for this visit.    Medication Side Effects: None  Allergies:  Allergies  Allergen Reactions   Flexeril [Cyclobenzaprine]     Unable to leave home when taking as she is incoherent/unsure where she is at times   Hydrocodone     REACTION: GI upset   Motrin [Ibuprofen]    Nsaids     Other Reaction(s): Renal disease   Prednisone Other (See Comments)   Robaxin [Methocarbamol]     Impairs judgement    Past Medical History:  Diagnosis Date   ACNE NEC    ALLERGIC RHINITIS    ALOPECIA    Anal fissure    Anxiety    Panic Attack.  None in a long time   Arthritis    Asymptomatic microscopic hematuria 12/05/2022   Bowel obstruction (HCC)    Breast mass 12/05/2022   Sep 04, 2008 Entered By: Bennie Pierini B Comment: benign on bx   Chronic headaches     migraine- none in years   Constipation    drinks tea with senna   DEPRESSION    Depression    DISORDER, BIPOLAR NEC    Family history of breast cancer  Family history of pancreatic cancer    GERD (gastroesophageal reflux disease)    HEMORRHOIDS-INTERNAL    History of stress test    ETT 10/17: Ex 7'; no chest pain, no ST changes, [facility] Treadmill Score 7   HYPERLIPIDEMIA    OBSTRUCTIVE SLEEP APNEA    Seasonal allergies     Family History  Problem Relation Age of Onset   Other Paternal Aunt        x 2, blood clotting disorder   Breast cancer Paternal Aunt 64   Heart disease Sister    Kidney disease Maternal Uncle    Esophageal cancer Paternal Uncle    Pancreatic cancer Paternal Uncle    COPD Father    Breast cancer Mother 76   Breast cancer Maternal Grandmother 45   Stroke Maternal Grandfather    Aneurysm Paternal Grandmother        brain   Hypertension Brother    Breast cancer Maternal Aunt        dx in her 72s   Breast cancer Maternal Aunt        dx in her 52s   Cancer Maternal Uncle        NOS   Cancer Maternal Uncle        tumor on his head   Breast cancer Paternal Aunt        dx in her 53s   Breast cancer Paternal Aunt        dx in her 69s   Lung cancer Paternal Uncle    Breast cancer Cousin 17       paternal first cousin   Colon cancer Maternal Uncle    Colon polyps Maternal Uncle    Irritable bowel syndrome Cousin    Rectal cancer Neg Hx    Stomach cancer Neg Hx     Social History   Socioeconomic History   Marital status: Married    Spouse name: Not on file   Number of children: 4   Years of education: Not on file   Highest education level: Not on file  Occupational History   Occupation: disabled    Employer: UNEMPLOYED  Tobacco Use   Smoking status: Former    Packs/day: 0.10    Years: 7.00    Additional pack years: 0.00    Total pack years: 0.70    Types: Cigarettes    Quit date: 06/19/1981    Years since quitting: 41.4   Smokeless  tobacco: Never  Substance and Sexual Activity   Alcohol use: No    Comment: rare   Drug use: No   Sexual activity: Not on file  Other Topics Concern   Not on file  Social History Narrative   Not on file   Social Determinants of Health   Financial Resource Strain: Not on file  Food Insecurity: Not on file  Transportation Needs: Not on file  Physical Activity: Not on file  Stress: Not on file  Social Connections: Not on file  Intimate Partner Violence: Not on file    Past Medical History, Surgical history, Social history, and Family history were reviewed and updated as appropriate.   Please see review of systems for further details on the patient's review from today.   Objective:   Physical Exam:  There were no vitals taken for this visit.  Physical Exam Constitutional:      General: She is not in acute distress.    Appearance: Normal appearance.  Musculoskeletal:  General: No deformity.  Neurological:     Mental Status: She is alert and oriented to person, place, and time.     Coordination: Coordination normal.     Comments: Using cane  Psychiatric:        Attention and Perception: Attention and perception normal.        Mood and Affect: Mood is anxious. Mood is not depressed. Affect is not labile, angry or inappropriate.        Speech: Speech normal.        Behavior: Behavior normal. Behavior is not slowed.        Thought Content: Thought content normal. Thought content is not delusional. Thought content does not include homicidal or suicidal ideation. Thought content does not include suicidal plan.        Cognition and Memory: Cognition normal.        Judgment: Judgment normal.     Comments: Insight intact.  Occ irritable. No auditory or visual hallucinations. No delusions.  ? Rationality of some of suspicions     Lab Review:     Component Value Date/Time   NA 140 12/05/2022 0925   K 4.2 12/05/2022 0925   CL 108 12/05/2022 0925   CO2 27 12/05/2022  0925   GLUCOSE 96 12/05/2022 0925   BUN 9 12/05/2022 0925   CREATININE 0.62 12/05/2022 0925   CALCIUM 10.0 12/05/2022 0925   PROT 7.0 12/05/2022 0925   ALBUMIN 3.9 12/05/2022 0925   AST 21 12/05/2022 0925   ALT 19 12/05/2022 0925   ALKPHOS 102 12/05/2022 0925   BILITOT 0.6 12/05/2022 0925   GFRNONAA >90 03/07/2014 0749   GFRAA >90 03/07/2014 0749       Component Value Date/Time   WBC 5.9 12/05/2022 0925   RBC 4.36 12/05/2022 0925   HGB 13.0 12/05/2022 0925   HCT 39.4 12/05/2022 0925   PLT 324.0 12/05/2022 0925   MCV 90.5 12/05/2022 0925   MCH 30.7 03/07/2014 0749   MCHC 32.9 12/05/2022 0925   RDW 14.3 12/05/2022 0925   LYMPHSABS 2.0 12/05/2022 0925   MONOABS 0.4 12/05/2022 0925   EOSABS 0.1 12/05/2022 0925   BASOSABS 0.0 12/05/2022 0925    Lithium Lvl  Date Value Ref Range Status  06/05/2013 0.59 (L) 0.80 - 1.40 mEq/L Final     No results found for: "PHENYTOIN", "PHENOBARB", "VALPROATE", "CBMZ"   .res Assessment: Plan:    Bipolar I disorder with seasonal pattern (HCC) - Plan: clonazePAM (KLONOPIN) 0.5 MG tablet  Generalized anxiety disorder  Insomnia due to mental condition - Plan: clonazePAM (KLONOPIN) 0.5 MG tablet  45 min face to face time with patient was spent on counseling and coordination of care.  Very med sensitive by her report.    Mrs. Kroh has some mood cycling.  Her husband passed April 2021.  She has had a lot of support.  She is handling it well. [employer] faith has helped.  Disc bipolar dx and need for meds longterm.  Disc treatment plan and SE.  She is concerned about SE with meds and doesn't want to take bipolar meds despite risk without them.  There is some concern over mood-driven thought disruption.  Despite being informed about the sig mental health risks of not taking a mood stabilizer with bipolar disorder that is her decsion.   Disc off label high dose OFA but this is not likely adequate alternative.  She asks about any non-mood  stabilizer opitons and we disc mild stabiilzing properties  of clonazepam.  Option clonazepam off label sleep or anxiety.   0.5-1.0 mg HS for sleep she agrees  We discussed the short-term risks associated with benzodiazepines including sedation and increased fall risk among others.  Discussed long-term side effect risk including dependence, potential withdrawal symptoms, and the potential eventual dose-related risk of dementia.  But recent studies from 2020 dispute this association between benzodiazepines and dementia risk. Newer studies in 2020 do not support an association with dementia.  She has benfitted from NAC  She is against Depakote bc fear of it affecting liver. Disc category of atypicals for mood stabilization bc no other options otherwise.  Discussed potential metabolic side effects associated with atypical antipsychotics, as well as potential risk for movement side effects. Advised pt to contact office if movement side effects occur.   Specifically disc TD risk bc has a cousin with it.   Lowest SE generic esp low cognitive risk potential other DA partial agonists but complained of clumsy with Abilify.    Adjusted to living alone.  FU couple ofmos or sooner prn  Meredith Staggers, MD, DFAPA   Please see After Visit Summary for patient specific instructions.  Future Appointments  Date Time Provider Department Center  01/02/2023 11:00 AM Henson, Vickie L, NP-C [facility] None    No orders of the defined types were placed in this encounter.      -------------------------------

## 2022-12-05 NOTE — Patient Instructions (Signed)
Please go downstairs for labs before you leave.  I am also ordering stool studies to look for an explanation of your chronic diarrhea.   I referred [facility] to [facility] and they will call you to schedule a visit.   I also referred you to a dermatologist.

## 2022-12-09 LAB — GI PROFILE, STOOL, PCR

## 2022-12-11 ENCOUNTER — Ambulatory Visit (INDEPENDENT_AMBULATORY_CARE_PROVIDER_SITE_OTHER): Payer: Medicare Other | Admitting: Professional Counselor

## 2022-12-11 ENCOUNTER — Encounter: Payer: Self-pay | Admitting: Professional Counselor

## 2022-12-11 DIAGNOSIS — F3189 Other bipolar disorder: Secondary | ICD-10-CM | POA: Diagnosis not present
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
## 2022-12-12 ENCOUNTER — Other Ambulatory Visit: Payer: Self-pay | Admitting: Family Medicine

## 2022-12-12 ENCOUNTER — Telehealth: Payer: Self-pay | Admitting: Psychiatry

## 2022-12-12 MED ORDER — AZITHROMYCIN 500 MG PO TABS: 500.0000 mg | ORAL_TABLET | Freq: Every day | ORAL | 0 refills | Status: DC

## 2022-12-12 NOTE — Progress Notes (Signed)
Her stool studies are not concerning but positive for a type of E.coli. I will send in an antibiotic for her to take for the next 3 days. Please ask her to schedule with [facility].

## 2022-12-12 NOTE — Telephone Encounter (Signed)
Erroneous encounter

## 2022-12-12 NOTE — Telephone Encounter (Signed)
Pt lvm that she needs a letter stating that she doesn't leave the house. This is for the post office so they will deliver the mail to her. Please give her a call at [phone]

## 2022-12-13 MED ORDER — AZITHROMYCIN 500 MG PO TABS: 500.0000 mg | ORAL_TABLET | Freq: Every day | ORAL | 0 refills | Status: DC

## 2022-12-13 NOTE — Telephone Encounter (Signed)
LVM to [facility] 

## 2022-12-13 NOTE — Addendum Note (Signed)
Addended by: Marinus Maw on: 12/13/2022 10:23 AM   Modules accepted: Orders

## 2022-12-15 NOTE — Telephone Encounter (Signed)
Letter written. She has an appt with Betsy on Tuesday and will pick up then.

## 2022-12-15 NOTE — Telephone Encounter (Signed)
Called patient again. She said she goes days without going out of the house, not even to get the mail. She said if she has a letter about this they will bring it to the door. She has an appt with Lanora Manis on 7/2 and would like to get letter at that time.

## 2022-12-19 ENCOUNTER — Ambulatory Visit: Payer: Medicare Other | Admitting: Professional Counselor

## 2022-12-19 ENCOUNTER — Encounter: Payer: Self-pay | Admitting: Professional Counselor

## 2022-12-19 ENCOUNTER — Encounter: Payer: Self-pay | Admitting: Family Medicine

## 2022-12-19 DIAGNOSIS — F319 Bipolar disorder, unspecified: Secondary | ICD-10-CM

## 2022-12-19 NOTE — Patient Instructions (Signed)
Plan: Pt to return for follow-up within a week. Continue to build rapport, assess hx, continue process work and Conservation officer, historic buildings. Until next session, pt to participate in self care activity of her choice per our discussion of options of her choosing.

## 2022-12-19 NOTE — Progress Notes (Signed)
      [facility] Counselor/Therapist Progress Note  Patient ID: Brittany [name], MRN: [id number],    Date: 12/19/2022  Time Spent: 9;05a - 10:15a   Treatment Type: Individual Therapy  Reported Symptoms: Depression: sleeplessness, worries, sadness, grief/loss  Mental Status Exam:  Appearance:   Neat     Behavior:  Appropriate  Motor:  Normal  Speech/Language:   Normal Rate  Affect:  Appropriate  Mood:  normal  Thought process:  normal  Thought content:    WNL  Sensory/Perceptual disturbances:    WNL  Orientation:  oriented to person, place, time/date, situation  Attention:  WNL  Concentration:  Good  Memory:  WNL  Fund of knowledge:   Good  Insight:    Good  Judgment:   Good  Impulse Control:  Good   Risk Assessment: Danger to Self:  No Self-injurious Behavior: No Danger to Others: No Duty to Warn:no Physical Aggression / Violence:No  Access to Firearms a concern: No  Gang Involvement:No   Subjective: Pt reported having put her dog down since last session. She processed experience and nature of her sleeplessness and worries, centering around financial concerns and the state of politics and her safety. Pt processed her experience of her spirituality, its evolution and its focus in her life. She voiced Bible study as central to maintaining her well-being. Pt shared regarding grief and loss in her life by hx, and other hx including as relates trauma, and lifestyle and behavioral experiences across time. Pt engaged with counselor to review and customize her treatment plan around symptoms of depression (treatment plan template not plan loaded from intake). Pt gave her consent to revised treatment plan. Pt identified short-term goal of dressing up and/or salon visit to enhance self care and boost self esteem before next session.  Interventions: Counselor facilitated GAD assessment to track pt anxiety; score 6. Pt identified concern with depressive symptoms more so than anxiety;  counselor will continue to track anxiety presentation. Counselor utilized person-centered interventions of affirmation, resourcing, active listening, facilitation of insight; spiritually-integrated psychotherapy: resourcing pt faith, values and inner strength. Counselor discussed treatment plan with pt and she and pt customized plan to reflect pt goals.   Diagnosis:   ICD-10-CM   1. Bipolar I disorder with seasonal pattern (HCC)  F31.9       Plan: Pt to return for follow-up within a week. Continue to build rapport, assess hx, continue process work and Conservation officer, historic buildings. Until next session, pt to participate in self care activity of her choice per our discussion of options of her choosing.  Gaspar Bidding, [facility]

## 2022-12-25 ENCOUNTER — Ambulatory Visit (INDEPENDENT_AMBULATORY_CARE_PROVIDER_SITE_OTHER): Payer: Medicare Other | Admitting: Professional Counselor

## 2022-12-25 ENCOUNTER — Telehealth: Payer: Self-pay | Admitting: Family Medicine

## 2022-12-25 DIAGNOSIS — F319 Bipolar disorder, unspecified: Secondary | ICD-10-CM | POA: Diagnosis not present

## 2022-12-25 NOTE — Telephone Encounter (Signed)
LM for pt advising we have received her previous records from [place] and that Larene [place] is not in office today but I will have her look at them tomorrow so that MRI order can be placed.

## 2022-12-25 NOTE — Telephone Encounter (Signed)
Pt called sating a form suppose to be sent from [place] Really wasn't understanding what saying pt was getting upset tried looking in the chart if we had anything. Pt  was upset because no one haven't called back and she stated she suppose to get MRI done twice a year. I advise pt I will send a message and someone will reach out to her.

## 2022-12-25 NOTE — Progress Notes (Unsigned)
      [facility] Counselor/Therapist Progress Note  Patient ID: Brittany [name], MRN: [id number],    Date: 12/25/2022  Time Spent: 9:16a - 10:03a   Treatment Type: Individual Therapy  Reported Symptoms: worries, low mood, somatic pain, diminished motivation  Mental Status Exam:  Appearance:   Neat     Behavior:  Appropriate and Sharing  Motor:  Normal  Speech/Language:   Normal Rate  Affect:  Appropriate and Congruent  Mood:  normal  Thought process:  normal  Thought content:    WNL  Sensory/Perceptual disturbances:    WNL  Orientation:  oriented to person, place, time/date, and situation  Attention:  Good  Concentration:  Good  Memory:  WNL  Fund of knowledge:   Good  Insight:    Good  Judgment:   Good  Impulse Control:  Good   Risk Assessment: Danger to Self:  No Self-injurious Behavior: No Danger to Others: No Duty to Warn:no Physical Aggression / Violence:No  Access to Firearms a concern: No  Gang Involvement:No   Subjective: Pt reported walking every day and dressing nicely for church per her self-directed short-term goals from prior session. Pt identified two new short term goals for herself, including resolving tax and license issues. She processed experience of somatic pain. She explored meaning of her faith to her at this time in her life, and reflected on its trajectory over the course of her life. She reflected on her experience of manic symptoms by hx, and she and counselor discussed possible rapid cycling features. Counselor and pt discussed pt quick wit, sharpness, sense of humor, and how these qualities impact her well-being positively.   Interventions: Solution-Oriented/Positive Psychology and Humanistic/Existential, Spiritually-Integrated Psychotherapy  Diagnosis:   ICD-10-CM   1. Bipolar I disorder with seasonal pattern (HCC)  F31.9       Plan: Pt is scheduled for a follow-up; continue to build rapport, process work, Sales executive.  Gaspar Bidding, [facility]

## 2022-12-27 ENCOUNTER — Encounter: Payer: Self-pay | Admitting: Professional Counselor

## 2022-12-27 NOTE — Patient Instructions (Signed)
Work towards completion of two self-identified short-term goals (tax and license issues)

## 2022-12-28 ENCOUNTER — Other Ambulatory Visit: Payer: Self-pay | Admitting: Family Medicine

## 2022-12-28 DIAGNOSIS — Z9889 Other specified postprocedural states: Secondary | ICD-10-CM

## 2022-12-28 DIAGNOSIS — R92333 Mammographic heterogeneous density, bilateral breasts: Secondary | ICD-10-CM

## 2022-12-28 NOTE — Telephone Encounter (Signed)
Called pt and informed her of orders placed. Pt reports she had a genetic testing done at [facility] years ago which showed a strong family hx of breast cancer which allows her to get 2 breast MRIs a year. She personally does not have a hx of breast cancer, just family as her mother passed away from it as well. Pt states she has never had a problem w insurance coverage but will let us know if she has any issues that arise.

## 2022-12-29 ENCOUNTER — Other Ambulatory Visit: Payer: Self-pay | Admitting: Psychiatry

## 2022-12-29 DIAGNOSIS — F319 Bipolar disorder, unspecified: Secondary | ICD-10-CM

## 2023-01-02 ENCOUNTER — Telehealth: Payer: Medicare Other | Admitting: Family Medicine

## 2023-01-02 ENCOUNTER — Ambulatory Visit (INDEPENDENT_AMBULATORY_CARE_PROVIDER_SITE_OTHER): Payer: Medicare Other | Admitting: Professional Counselor

## 2023-01-02 ENCOUNTER — Encounter: Payer: Self-pay | Admitting: Professional Counselor

## 2023-01-02 ENCOUNTER — Encounter: Payer: Self-pay | Admitting: Family Medicine

## 2023-01-02 DIAGNOSIS — Z800 Family history of malignant neoplasm of digestive organs: Secondary | ICD-10-CM | POA: Diagnosis not present

## 2023-01-02 DIAGNOSIS — Z803 Family history of malignant neoplasm of breast: Secondary | ICD-10-CM | POA: Diagnosis not present

## 2023-01-02 DIAGNOSIS — F319 Bipolar disorder, unspecified: Secondary | ICD-10-CM | POA: Diagnosis not present

## 2023-01-02 DIAGNOSIS — K529 Noninfective gastroenteritis and colitis, unspecified: Secondary | ICD-10-CM | POA: Diagnosis not present

## 2023-01-02 NOTE — Patient Instructions (Signed)
Address personal administrative goals as identified in session
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
## 2023-01-02 NOTE — Progress Notes (Signed)
MyChart Video Visit    Virtual Visit via Video Note    Patient location: Home. Patient and provider in visit Provider location: Office  I discussed the limitations of evaluation and management by telemedicine and the availability of in person appointments. The patient expressed understanding and agreed to proceed.  Visit Date: 01/02/2023  Today's healthcare provider: Hetty Blend, NP-C     Subjective:    Patient ID: Brittany [name], female    DOB: 07-04-1955, 67 y.o.   MRN: [id number]  Chief Complaint  Patient presents with   Follow-up    HPI  Diarrhea -chronic. States she has 3 bowel movements per day. She took a 3 day course of azithromycin and reports some improvement.   Certain foods trigger diarrhea like watermelon.   Hx of gallbladder removal.   States she had genetic testing that showed she is at an increased risk for breast cancer.  Mother and 2 maternal aunts passed away from breast cancer.   2 cousins with pancreatic cancer    Past Medical History:  Diagnosis Date   ACNE NEC    ALLERGIC RHINITIS    ALOPECIA    Anal fissure    Anxiety    Panic Attack.  None in a long time   Arthritis    Asymptomatic microscopic hematuria 12/05/2022   Bowel obstruction (HCC)    Breast mass 12/05/2022   Sep 04, 2008 Entered By: Bennie Pierini B Comment: benign on bx   Chronic headaches    migraine- none in years   Constipation    drinks tea with senna   DEPRESSION    Depression    DISORDER, BIPOLAR NEC    Family history of breast cancer    Family history of pancreatic cancer    GERD (gastroesophageal reflux disease)    HEMORRHOIDS-INTERNAL    History of stress test    ETT 10/17: Ex 7'; no chest pain, no ST changes, [facility] Treadmill Score 7   HYPERLIPIDEMIA    OBSTRUCTIVE SLEEP APNEA    Seasonal allergies     Past Surgical History:  Procedure Laterality Date   BREAST LUMPECTOMY Right last done 2005   x 2   CHOLECYSTECTOMY N/A 03/07/2014   Procedure:  LAPAROSCOPIC CHOLECYSTECTOMY WITH INTRAOPERATIVE CHOLANGIOGRAM;  Surgeon: Claud Kelp, MD;  Location: [facility] OR;  Service: General;  Laterality: N/A;   EUS N/A 01/27/2014   Procedure: ESOPHAGEAL ENDOSCOPIC ULTRASOUND (EUS) RADIAL;  Surgeon: Willis Modena, MD;  Location: [facility];  Service: Endoscopy;  Laterality: N/A;   HAND SURGERY Bilateral    for arthritis  left 02/02/14- right approx 2014   KNEE SURGERY Bilateral    right x 2 left x 1.  arthroscopy   RADICAL HYSTERECTOMY     thumb and pinkie finger surgery Right 2014   TONSILLECTOMY      Family History  Problem Relation Age of Onset   Other Paternal Aunt        x 2, blood clotting disorder   Breast cancer Paternal Aunt 51   Heart disease Sister    Kidney disease Maternal Uncle    Esophageal cancer Paternal Uncle    Pancreatic cancer Paternal Uncle    COPD Father    Breast cancer Mother 61   Breast cancer Maternal Grandmother 45   Stroke Maternal Grandfather    Aneurysm Paternal Grandmother        brain   Hypertension Brother    Breast cancer Maternal Aunt  dx in her 31s   Breast cancer Maternal Aunt        dx in her 13s   Cancer Maternal Uncle        NOS   Cancer Maternal Uncle        tumor on his head   Breast cancer Paternal Aunt        dx in her 10s   Breast cancer Paternal Aunt        dx in her 66s   Lung cancer Paternal Uncle    Breast cancer Cousin 105       paternal first cousin   Colon cancer Maternal Uncle    Colon polyps Maternal Uncle    Irritable bowel syndrome Cousin    Rectal cancer Neg Hx    Stomach cancer Neg Hx     Social History   Socioeconomic History   Marital status: Married    Spouse name: Not on file   Number of children: 4   Years of education: Not on file   Highest education level: Not on file  Occupational History   Occupation: disabled    Employer: UNEMPLOYED  Tobacco Use   Smoking status: Former    Current packs/day: 0.00    Average packs/day: 0.1 packs/day for 7.0  years (0.7 ttl pk-yrs)    Types: Cigarettes    Start date: 06/19/1974    Quit date: 06/19/1981    Years since quitting: 41.5   Smokeless tobacco: Never  Substance and Sexual Activity   Alcohol use: No    Comment: rare   Drug use: No   Sexual activity: Not on file  Other Topics Concern   Not on file  Social History Narrative   Not on file   Social Determinants of Health   Financial Resource Strain: Medium Risk (12/11/2022)   Overall Financial Resource Strain (CARDIA)    Difficulty of Paying Living Expenses: Somewhat hard  Food Insecurity: Not on file  Transportation Needs: Not on file  Physical Activity: Not on file  Stress: Stress Concern Present (12/11/2022)   [employer] of Occupational Health - Occupational Stress Questionnaire    Feeling of Stress : Very much  Social Connections: Unknown (12/11/2022)   Social Connection and Isolation Panel [NHANES]    Frequency of Communication with Friends and Family: Not on file    Frequency of Social Gatherings with Friends and Family: Not on file    Attends Religious Services: Not on file    Active Member of Clubs or Organizations: Yes    Attends Banker Meetings: Not on file    Marital Status: Divorced  Intimate Partner Violence: Not on file    Outpatient Medications Prior to Visit  Medication Sig Dispense Refill   clonazePAM (KLONOPIN) 0.5 MG tablet Take 1-2 tablets (0.5-1 mg total) by mouth at bedtime. 60 tablet 0   diclofenac sodium (VOLTAREN) 1 % GEL Apply 2 g topically 4 (four) times daily. 100 g 1   meloxicam (MOBIC) 15 MG tablet Take 1 tablet (15 mg total) by mouth daily. 30 tablet 1   Multiple Vitamin (MULTIVITAMIN WITH MINERALS) TABS Take 1 tablet by mouth daily.     saline (AYR) GEL Place 1 Application into both nostrils every 6 (six) hours as needed. 42.3 g 0   tiZANidine (ZANAFLEX) 2 MG tablet      Vitamin Mixture (ESTER-C PO) Take 1,000 mg by mouth.     azithromycin (ZITHROMAX) 500 MG tablet Take 1  tablet (500 mg  total) by mouth daily. (Patient not taking: Reported on 01/02/2023) 3 tablet 0   No facility-administered medications prior to visit.    Allergies  Allergen Reactions   Flexeril [Cyclobenzaprine]     Unable to leave home when taking as she is incoherent/unsure where she is at times   Hydrocodone     REACTION: GI upset   Motrin [Ibuprofen]    Nsaids     Other Reaction(s): Renal disease   Prednisone Other (See Comments)   Robaxin [Methocarbamol]     Impairs judgement    Review of Systems  Constitutional:  Negative for chills, fever and weight loss.  Respiratory:  Negative for shortness of breath.   Cardiovascular:  Negative for chest pain and palpitations.  Gastrointestinal:  Positive for abdominal pain and diarrhea. Negative for constipation, nausea and vomiting.  Neurological:  Negative for dizziness and focal weakness.       Objective:    Physical Exam Constitutional:      General: She is not in acute distress.    Appearance: She is not ill-appearing.  Pulmonary:     Effort: Pulmonary effort is normal.  Musculoskeletal:     Cervical back: Normal range of motion.  Neurological:     General: No focal deficit present.     Mental Status: She is alert and oriented to person, place, and time.  Psychiatric:        Mood and Affect: Mood normal.        Behavior: Behavior normal.        Thought Content: Thought content normal.     There were no vitals taken for this visit. Wt Readings from Last 3 Encounters:  12/05/22 182 lb (82.6 kg)  04/07/22 193 lb 9.6 oz (87.8 kg)  02/01/22 190 lb (86.2 kg)       Assessment & Plan:   Problem List Items Addressed This Visit       Other   Family history of breast cancer   Family history of pancreatic cancer   Other Visit Diagnoses     Chronic diarrhea    -  Primary      Reviewed labs with patient.  We also reviewed an abdominal MRI from 2023 which showed normal pancreas.  Discussed that fortunately she is  not having any red flag symptoms.  Completed a course of azithromycin for diarrhea with Rotan improvement.  She has been referred to gastroenterology.  She may call to schedule. I have ordered a mammogram and breast MRI.  She is awaiting scheduling. Follow-up as needed.  I have discontinued Keajah G. Eckstein's azithromycin. I am also having her maintain her multivitamin with minerals, diclofenac sodium, meloxicam, tiZANidine, Vitamin Mixture (ESTER-C PO), saline, and clonazePAM.  No orders of the defined types were placed in this encounter.   I discussed the assessment and treatment plan with the patient. The patient was provided an opportunity to ask questions and all were answered. The patient agreed with the plan and demonstrated an understanding of the instructions.   The patient was advised to call back or seek an in-person evaluation if the symptoms worsen or if the condition fails to improve as anticipated.     Hetty Blend, NP-C [facility] [place] [phone] (phone) [phone] (fax)  [facility]

## 2023-01-08 ENCOUNTER — Encounter: Payer: Self-pay | Admitting: Professional Counselor

## 2023-01-08 ENCOUNTER — Ambulatory Visit (INDEPENDENT_AMBULATORY_CARE_PROVIDER_SITE_OTHER): Payer: Medicare Other | Admitting: Professional Counselor

## 2023-01-08 DIAGNOSIS — F319 Bipolar disorder, unspecified: Secondary | ICD-10-CM | POA: Diagnosis not present

## 2023-01-08 NOTE — Progress Notes (Signed)
      [facility] Counselor/Therapist Progress Note  Patient ID: Brittany [name], MRN: [id number],    Date: 01/08/2023  Time Spent: 9:06a - 10:04a   Treatment Type: Individual Therapy  Reported Symptoms: worries, stress, low mood, fatigue, somatic pain  Mental Status Exam:  Appearance:   Neat     Behavior:  Appropriate and Sharing  Motor:  Normal  Speech/Language:   Clear and Coherent and Normal Rate  Affect:  Appropriate and Congruent  Mood:  normal  Thought process:  normal  Thought content:    WNL  Sensory/Perceptual disturbances:    WNL  Orientation:  oriented to person, place, time/date, and situation  Attention:  Good  Concentration:  Good  Memory:  WNL  Fund of knowledge:   Good  Insight:    Good  Judgment:   Good  Impulse Control:  Good   Risk Assessment: Danger to Self:  No Self-injurious Behavior: No Danger to Others: No Duty to Warn:no Physical Aggression / Violence:No  Access to Firearms a concern: No  Gang Involvement:No   Subjective: Counselor provided person-centered counseling including active listening, affirmation, resourcing, facilitaton of insight. Pt reflected on her experience of low mood and sense of feeling cloudy, and reported having felt badly enough the week prior that she did not feel like walking. She reported that not walking in turn flares her sciatica and necessitates her taking pain medication. She and counselor discussed pt keeping record of how she feels to track symptomology and impact on well-being. Counselor and pt discussed pt naturopathic orientation and her holistic approach to self care. Pt processed her marital and military hx and related intrapersonal and interpersonal development with counselor.  Interventions: Solution-Oriented/Positive Psychology, Humanistic/Existential, and Insight-Oriented  Diagnosis:   ICD-10-CM   1. Bipolar I disorder with seasonal pattern (HCC)  F31.9       Plan: Pt is scheduled for a follow-up.  Continue process work and Conservation officer, historic buildings. [place] to track symptoms and pt self care efforts.   Gaspar Bidding, [facility]

## 2023-01-16 ENCOUNTER — Ambulatory Visit (INDEPENDENT_AMBULATORY_CARE_PROVIDER_SITE_OTHER): Payer: Medicare Other | Admitting: Professional Counselor

## 2023-01-16 ENCOUNTER — Encounter: Payer: Self-pay | Admitting: Professional Counselor

## 2023-01-16 DIAGNOSIS — F319 Bipolar disorder, unspecified: Secondary | ICD-10-CM | POA: Diagnosis not present

## 2023-01-16 NOTE — Progress Notes (Signed)
      [facility] Counselor/Therapist Progress Note  Patient ID: Brittany [name], MRN: [id number],    Date: 01/16/2023  Time Spent: 9:04a - 10:03a   Treatment Type: Individual Therapy  Reported Symptoms: worries, stress, tearfulness, preoccupying thoughts, self esteem concerns  Mental Status Exam:  Appearance:   Neat     Behavior:  Appropriate and Sharing  Motor:  Normal  Speech/Language:   Clear and Coherent and Normal Rate  Affect:  Appropriate and Congruent  Mood:  tearful  Thought process:  normal  Thought content:    WNL  Sensory/Perceptual disturbances:    WNL  Orientation:  oriented to person, place, time/date, and situation  Attention:  Good  Concentration:  Good  Memory:  WNL  Fund of knowledge:   Good  Insight:    Good  Judgment:   Good  Impulse Control:  Good   Risk Assessment: Danger to Self:  No Self-injurious Behavior: No Danger to Others: No Duty to Warn:no Physical Aggression / Violence:No  Access to Firearms a concern: No  Gang Involvement:No   Subjective: Pt presented to session voicing sense of cloudiness after having taken pain medications the past week; she voiced frustration at state of health when pain is unbearable, and not liking to have to take the medications. Counselor affirmed pt experience. She reported kitchen renovations to be underway soon and gladness for the promise of progress despite the disruption. Pt identified struggling with self esteem where weight, strength, personal style and other factors concerned; counselor assisted with pt discernment of realistic expectations and those born of negative self talk, and they discussed strategies to facilitate motivation and improvement where desired. Pt processed experience of stalking by hx, and questioned what may be a trauma response pattern (fear of strangers, some startling), and counselor helped facilitate pt self insight.   Interventions: Solution-Oriented/Positive Psychology,  Humanistic/Existential, and Insight-Oriented  Diagnosis:   ICD-10-CM   1. Bipolar I disorder with seasonal pattern (HCC)  F31.9       Plan: Pt to return in a week. Continue process work and Conservation officer, historic buildings. Pt voiced intention to journal before next session to explore thoughts and feelings around personal hx, and bring results to next session to discuss.   Gaspar Bidding, [facility]

## 2023-01-18 ENCOUNTER — Ambulatory Visit: Payer: Medicare Other | Admitting: Psychiatry

## 2023-01-22 ENCOUNTER — Telehealth: Payer: Self-pay

## 2023-01-22 ENCOUNTER — Ambulatory Visit (INDEPENDENT_AMBULATORY_CARE_PROVIDER_SITE_OTHER): Payer: Medicare Other

## 2023-01-22 VITALS — Ht 61.75 in | Wt 182.8 lb

## 2023-01-22 DIAGNOSIS — Z0000 Encounter for general adult medical examination without abnormal findings: Secondary | ICD-10-CM | POA: Diagnosis not present

## 2023-01-22 DIAGNOSIS — Z780 Asymptomatic menopausal state: Secondary | ICD-10-CM

## 2023-01-22 NOTE — Progress Notes (Signed)
Subjective:   Brittany [name] is a 67 y.o. female who presents for Medicare Annual (Subsequent) preventive examination.  Visit Complete: In person   Review of Systems    Cardiac Risk Factors include: advanced age (>49men, >50 women);dyslipidemia;Other (see comment), Risk factor comments: OSA     Objective:    Today's Vitals   01/22/23 0952 01/22/23 0953  Weight: 182 lb 12.8 oz (82.9 kg)   Height: 5' 1.75" (1.568 m)   PainSc:  8    Body mass index is 33.71 kg/m.     01/22/2023   10:05 AM 02/01/2022    8:23 PM 01/24/2017    9:00 AM 05/05/2015    2:21 PM 04/20/2015    1:28 PM 01/27/2015   11:19 AM 03/07/2014    7:38 AM  Advanced Directives  Does Patient Have a Medical Advance Directive? Yes Yes No;Yes Yes No Yes Yes  Type of Estate agent of [place];Living will Healthcare Power of [place];Living will Healthcare Power of [place];Living will Living will;Healthcare Power of [employer] Power of [place];Living will Healthcare Power of [place];Living will  Does patient want to make changes to medical advance directive?  No - Patient declined     No - Patient declined  Copy of Healthcare Power of Attorney in Chart? No - copy requested No - copy requested No - copy requested Yes   No - copy requested    Current Medications (verified) Outpatient Encounter Medications as of 01/22/2023  Medication Sig   diclofenac sodium (VOLTAREN) 1 % GEL Apply 2 g topically 4 (four) times daily.   meloxicam (MOBIC) 15 MG tablet Take 1 tablet (15 mg total) by mouth daily.   Multiple Vitamin (MULTIVITAMIN WITH MINERALS) TABS Take 1 tablet by mouth daily.   saline (AYR) GEL Place 1 Application into both nostrils every 6 (six) hours as needed.   tiZANidine (ZANAFLEX) 2 MG tablet    Vitamin Mixture (ESTER-C PO) Take 1,000 mg by mouth.   clonazePAM (KLONOPIN) 0.5 MG tablet Take 1-2 tablets (0.5-1 mg total) by mouth at bedtime. (Patient not taking: Reported on 01/22/2023)   No  facility-administered encounter medications on file as of 01/22/2023.    Allergies (verified) Flexeril [cyclobenzaprine], Hydrocodone, Motrin [ibuprofen], Nsaids, Prednisone, and Robaxin [methocarbamol]   History: Past Medical History:  Diagnosis Date   ACNE NEC    ALLERGIC RHINITIS    ALOPECIA    Anal fissure    Anxiety    Panic Attack.  None in a long time   Arthritis    Asymptomatic microscopic hematuria 12/05/2022   Bowel obstruction (HCC)    Breast mass 12/05/2022   Sep 04, 2008 Entered By: Bennie Pierini B Comment: benign on bx   Chronic headaches    migraine- none in years   Constipation    drinks tea with senna   DEPRESSION    Depression    DISORDER, BIPOLAR NEC    Family history of breast cancer    Family history of pancreatic cancer    GERD (gastroesophageal reflux disease)    HEMORRHOIDS-INTERNAL    History of stress test    ETT 10/17: Ex 7'; no chest pain, no ST changes, [facility] Treadmill Score 7   HYPERLIPIDEMIA    OBSTRUCTIVE SLEEP APNEA    Seasonal allergies    Past Surgical History:  Procedure Laterality Date   BREAST LUMPECTOMY Right last done 2005   x 2   CHOLECYSTECTOMY N/A 03/07/2014   Procedure: LAPAROSCOPIC CHOLECYSTECTOMY WITH INTRAOPERATIVE CHOLANGIOGRAM;  Surgeon: Claud Kelp, MD;  Location: [facility] OR;  Service: General;  Laterality: N/A;   EUS N/A 01/27/2014   Procedure: ESOPHAGEAL ENDOSCOPIC ULTRASOUND (EUS) RADIAL;  Surgeon: Willis Modena, MD;  Location: [facility];  Service: Endoscopy;  Laterality: N/A;   HAND SURGERY Bilateral    for arthritis  left 02/02/14- right approx 2014   KNEE SURGERY Bilateral    right x 2 left x 1.  arthroscopy   RADICAL HYSTERECTOMY     thumb and pinkie finger surgery Right 2014   TONSILLECTOMY     Family History  Problem Relation Age of Onset   Other Paternal Aunt        x 2, blood clotting disorder   Breast cancer Paternal Aunt 49   Heart disease Sister    Kidney disease Maternal Uncle    Esophageal cancer  Paternal Uncle    Pancreatic cancer Paternal Uncle    COPD Father    Breast cancer Mother 24   Breast cancer Maternal Grandmother 45   Stroke Maternal Grandfather    Aneurysm Paternal Grandmother        brain   Hypertension Brother    Breast cancer Maternal Aunt        dx in her 42s   Breast cancer Maternal Aunt        dx in her 73s   Cancer Maternal Uncle        NOS   Cancer Maternal Uncle        tumor on his head   Breast cancer Paternal Aunt        dx in her 54s   Breast cancer Paternal Aunt        dx in her 36s   Lung cancer Paternal Uncle    Breast cancer Cousin 54       paternal first cousin   Colon cancer Maternal Uncle    Colon polyps Maternal Uncle    Irritable bowel syndrome Cousin    Rectal cancer Neg Hx    Stomach cancer Neg Hx    Social History   Socioeconomic History   Marital status: Married    Spouse name: Not on file   Number of children: 4   Years of education: Not on file   Highest education level: Not on file  Occupational History   Occupation: disabled    Employer: UNEMPLOYED  Tobacco Use   Smoking status: Former    Current packs/day: 0.00    Average packs/day: 0.1 packs/day for 7.0 years (0.7 ttl pk-yrs)    Types: Cigarettes    Start date: 06/19/1974    Quit date: 06/19/1981    Years since quitting: 41.6   Smokeless tobacco: Never  Vaping Use   Vaping status: Never Used  Substance and Sexual Activity   Alcohol use: No    Comment: rare   Drug use: No   Sexual activity: Not on file  Other Topics Concern   Not on file  Social History Narrative   Not on file   Social Determinants of Health   Financial Resource Strain: Low Risk  (01/22/2023)   Overall Financial Resource Strain (CARDIA)    Difficulty of Paying Living Expenses: Not hard at all  Recent Concern: Financial Resource Strain - Medium Risk (12/11/2022)   Overall Financial Resource Strain (CARDIA)    Difficulty of Paying Living Expenses: Somewhat hard  Food Insecurity: No Food  Insecurity (01/22/2023)   Hunger Vital Sign    Worried About Running Out of Food  in the Last Year: Never true    Ran Out of Food in the Last Year: Never true  Transportation Needs: No Transportation Needs (01/22/2023)   PRAPARE - Administrator, Civil Service (Medical): No    Lack of Transportation (Non-Medical): No  Physical Activity: Inactive (01/22/2023)   Exercise Vital Sign    Days of Exercise per Week: 0 days    Minutes of Exercise per Session: 0 min  Stress: Stress Concern Present (01/22/2023)   [employer] of Occupational Health - Occupational Stress Questionnaire    Feeling of Stress : Very much  Social Connections: Moderately Integrated (01/22/2023)   Social Connection and Isolation Panel [NHANES]    Frequency of Communication with Friends and Family: More than three times a week    Frequency of Social Gatherings with Friends and Family: More than three times a week    Attends Religious Services: More than 4 times per year    Active Member of [employer] or Organizations: Yes    Attends Banker Meetings: 1 to 4 times per year    Marital Status: Widowed    Tobacco Counseling Counseling given: Not Answered   Clinical Intake:  Pre-visit preparation completed: Yes  Pain : 0-10 Pain Score: 8  Pain Type: Acute pain Pain Location: Back Pain Onset: More than a month ago     BMI - recorded: 33.71 Nutritional Risks: Nausea/ vomitting/ diarrhea (per patient diarrhea this morning) Diabetes: No  How often do you need to have someone help you when you read instructions, pamphlets, or other written materials from your doctor or pharmacy?: 1 - Never  Interpreter Needed?: No  Information entered by ::  , RMA   Activities of Daily Living    01/22/2023    9:56 AM  In your present state of health, do you have any difficulty performing the following activities:  Hearing? 0  Vision? 0  Difficulty concentrating or making decisions? 0  Walking or  climbing stairs? 0  Dressing or bathing? 1  Comment Per patient-has a caregiver  Doing errands, shopping? 0  Comment Has a caregiver is with her  Preparing Food and eating ? N  Using the Toilet? N  In the past six months, have you accidently leaked urine? Y  Do you have problems with loss of bowel control? N  Managing your Medications? N  Managing your Finances? N  Housekeeping or managing your Housekeeping? N    Patient Care Team: Avanell Shackleton, NP-C as PCP - General (Family Medicine) Katheren Puller, MD (Otolaryngology)  Indicate any recent Medical Services you may have received from other than [facility] providers in the past year (date may be approximate).     Assessment:   This is a routine wellness examination for [place].  Hearing/Vision screen Hearing Screening - Comments:: Denies hearing difficulties   Vision Screening - Comments:: Denies eye issues.  Dietary issues and exercise activities discussed:     Goals Addressed             This Visit's Progress    lose weight   On track    Continue to walk and exercise as tolerated due to my severe back issues, and eat healthy      Depression Screen    01/22/2023   10:12 AM 12/11/2022   11:18 AM 12/05/2022    8:25 AM 01/24/2017    8:55 AM 04/20/2015    1:34 PM  PHQ 2/9 Scores  PHQ - 2 Score  1  2 1 6   PHQ- 9 Score 3   1 18      Information is confidential and restricted. Go to Review Flowsheets to unlock data.     Fall Risk    01/22/2023   10:05 AM 12/05/2022    8:25 AM 01/24/2017    8:55 AM 04/20/2015    1:34 PM  Fall Risk   Falls in the past year? 1 0 Yes No  Number falls in past yr: 1 0 1   Injury with Fall? 1 0 Yes   Risk for fall due to : Medication side effect;Impaired mobility No Fall Risks    Follow up Falls evaluation completed;Falls prevention discussed Falls evaluation completed Falls prevention discussed     MEDICARE RISK AT HOME:  Medicare Risk at Home - 01/22/23 1006     Any stairs in or around  the home? No    Home free of loose throw rugs in walkways, pet beds, electrical cords, etc? No    Adequate lighting in your home to reduce risk of falls? Yes    Life alert? No    Use of a cane, walker or w/c? Yes    Grab bars in the bathroom? No    Shower chair or bench in shower? No    Elevated toilet seat or a handicapped toilet? No             TIMED UP AND GO:  Was the test performed?  Yes  Length of time to ambulate 10 feet: 15 sec Gait steady and fast without use of assistive device    Cognitive Function:        01/22/2023   10:07 AM  6CIT Screen  What time? 0 points  Count back from 20 0 points  Months in reverse 0 points  Repeat phrase 0 points    Immunizations Immunization History  Administered Date(s) Administered   Influenza Split 03/28/2016, 01/17/2017   Influenza,inj,Quad PF,6+ Mos 03/17/2017, 02/17/2018, 02/14/2019, 04/07/2022   Influenza-Unspecified 02/27/2020, 03/19/2021, 03/19/2022   Moderna Sars-Covid-2 Vaccination 09/05/2019, 04/11/2020, 10/06/2020   Tdap 01/24/2012, 02/12/2012, 06/06/2017   Zoster Recombinant(Shingrix) 03/15/2018, 05/21/2018    TDAP status: Up to date  Flu Vaccine status: Up to date  Pneumococcal vaccine status: Due, Education has been provided regarding the importance of this vaccine. Advised may receive this vaccine at local pharmacy or Health Dept. Aware to provide a copy of the vaccination record if obtained from local pharmacy or Health Dept. Verbalized acceptance and understanding.  Covid-19 vaccine status: Completed vaccines  Qualifies for Shingles Vaccine? Yes   Zostavax completed Yes   Shingrix Completed?: Yes  Screening Tests Health Maintenance  Topic Date Due   PAP SMEAR-Modifier  09/13/2014   Colonoscopy  10/02/2021   COVID-19 Vaccine (4 - 2023-24 season) 02/17/2022   Pneumonia Vaccine 73+ Years old (1 of 1 - PCV) Never done   INFLUENZA VACCINE  01/18/2023   Medicare Annual Wellness (AWV)  01/22/2024    MAMMOGRAM  03/22/2024   DTaP/Tdap/Td (4 - Td or Tdap) 06/07/2027   DEXA SCAN  Completed   Hepatitis C Screening  Completed   HIV Screening  Completed   Zoster Vaccines- Shingrix  Completed   HPV VACCINES  Aged Out    Health Maintenance  Health Maintenance Due  Topic Date Due   PAP SMEAR-Modifier  09/13/2014   Colonoscopy  10/02/2021   COVID-19 Vaccine (4 - 2023-24 season) 02/17/2022   Pneumonia Vaccine 59+ Years old (1 of 1 -  PCV) Never done   INFLUENZA VACCINE  01/18/2023    Colorectal cancer screening: Type of screening: Colonoscopy. Completed 10/03/2011. Repeat every 10 years  Mammogram status: Completed 03/22/2022. Repeat every year  Bone Density status: Ordered 01/22/2023. Pt provided with contact info and advised to call to schedule appt.  Lung Cancer Screening: (Low Dose CT Chest recommended if Age 65-80 years, 20 pack-year currently smoking OR have quit w/in 15years.) does not qualify.   Lung Cancer Screening Referral: N/A  Additional Screening:  Hepatitis C Screening: does qualify; Completed 06/19/2010  Vision Screening: Recommended annual ophthalmology exams for early detection of glaucoma and other disorders of the eye. Is the patient up to date with their annual eye exam?  No  Who is the provider or what is the name of the office in which the patient attends annual eye exams? N/A If pt is not established with a provider, would they like to be referred to a provider to establish care? Yes .   Dental Screening: Recommended annual dental exams for proper oral hygiene  Community Resource Referral / Chronic Care Management: CRR required this visit?  No   CCM required this visit?  No     Plan:     I have personally reviewed and noted the following in the patient's chart:   Medical and social history Use of alcohol, tobacco or illicit drugs  Current medications and supplements including opioid prescriptions. Patient is not currently taking opioid  prescriptions. Functional ability and status Nutritional status Physical activity Advanced directives List of other physicians Hospitalizations, surgeries, and ER visits in previous 12 months Vitals Screenings to include cognitive, depression, and falls Referrals and appointments  In addition, I have reviewed and discussed with patient certain preventive protocols, quality metrics, and best practice recommendations. A written personalized care plan for preventive services as well as general preventive health recommendations were provided to patient.      L , CMA   01/22/2023   After Visit Summary: (Mail) Due to this being a telephonic visit, the after visit summary with patients personalized plan was offered to patient via mail   Nurse Notes: Patient is due for a DEXA and would like a recommendation for an eye doctor.  Per patient received PPSV23 vaccines last year at a pharmacy in [place].  She has an upcoming appointment in September for a colonoscopy -per patient with Dr. Russella Dar. No other concerns to address today.

## 2023-01-22 NOTE — Telephone Encounter (Signed)
Go ahead and order DEXA scan.  Thanks.

## 2023-01-22 NOTE — Patient Instructions (Addendum)
Brittany [name] , Thank you for taking time to come for your Medicare Wellness Visit. I appreciate your ongoing commitment to your health goals. Please review the following plan we discussed and let me know if I can assist you in the future.   Referrals/Orders/Follow-Ups/Clinician Recommendations: Please discuss recommendation with provider for eye care during your next office visit.    This is a list of the screening recommended for you and due dates:  Health Maintenance  Topic Date Due   Pap Smear  09/13/2014   Pneumonia Vaccine (1 of 1 - PCV) Never done   Flu Shot  01/18/2023   Colon Cancer Screening  03/19/2023*   Medicare Annual Wellness Visit  01/22/2024   Mammogram  03/22/2024   DTaP/Tdap/Td vaccine (4 - Td or Tdap) 06/07/2027   DEXA scan (bone density measurement)  Completed   Hepatitis C Screening  Completed   HIV Screening  Completed   Zoster (Shingles) Vaccine  Completed   HPV Vaccine  Aged Out   COVID-19 Vaccine  Discontinued  *Topic was postponed. The date shown is not the original due date.    Advanced directives: (Copy Requested) Please bring a copy of your health care power of attorney and living will to the office to be added to your chart at your convenience.  Next Medicare Annual Wellness Visit scheduled for next year: No  Preventive Care 80 Years and Older, Female Preventive care refers to lifestyle choices and visits with your health care provider that can promote health and wellness. What does preventive care include? A yearly physical exam. This is also called an annual well check. Dental exams once or twice a year. Routine eye exams. Ask your health care provider how often you should have your eyes checked. Personal lifestyle choices, including: Daily care of your teeth and gums. Regular physical activity. Eating a healthy diet. Avoiding tobacco and drug use. Limiting alcohol use. Practicing safe sex. Taking low-dose aspirin every day. Taking vitamin and  mineral supplements as recommended by your health care provider. What happens during an annual well check? The services and screenings done by your health care provider during your annual well check will depend on your age, overall health, lifestyle risk factors, and family history of disease. Counseling  Your health care provider may ask you questions about your: Alcohol use. Tobacco use. Drug use. Emotional well-being. Home and relationship well-being. Sexual activity. Eating habits. History of falls. Memory and ability to understand (cognition). Work and work Astronomer. Reproductive health. Screening  You may have the following tests or measurements: Height, weight, and BMI. Blood pressure. Lipid and cholesterol levels. These may be checked every 5 years, or more frequently if you are over 50 years old. Skin check. Lung cancer screening. You may have this screening every year starting at age 11 if you have a 30-pack-year history of smoking and currently smoke or have quit within the past 15 years. Fecal occult blood test (FOBT) of the stool. You may have this test every year starting at age 63. Flexible sigmoidoscopy or colonoscopy. You may have a sigmoidoscopy every 5 years or a colonoscopy every 10 years starting at age 62. Hepatitis C blood test. Hepatitis B blood test. Sexually transmitted disease (STD) testing. Diabetes screening. This is done by checking your blood sugar (glucose) after you have not eaten for a while (fasting). You may have this done every 1-3 years. Bone density scan. This is done to screen for osteoporosis. You may have this done starting at  age 17. Mammogram. This may be done every 1-2 years. Talk to your health care provider about how often you should have regular mammograms. Talk with your health care provider about your test results, treatment options, and if necessary, the need for more tests. Vaccines  Your health care provider may recommend  certain vaccines, such as: Influenza vaccine. This is recommended every year. Tetanus, diphtheria, and acellular pertussis (Tdap, Td) vaccine. You may need a Td booster every 10 years. Zoster vaccine. You may need this after age 87. Pneumococcal 13-valent conjugate (PCV13) vaccine. One dose is recommended after age 24. Pneumococcal polysaccharide (PPSV23) vaccine. One dose is recommended after age 61. Talk to your health care provider about which screenings and vaccines you need and how often you need them. This information is not intended to replace advice given to you by your health care provider. Make sure you discuss any questions you have with your health care provider. Document Released: 07/02/2015 Document Revised: 02/23/2016 Document Reviewed: 04/06/2015 Elsevier Interactive Patient Education  2017 [employer].  Fall Prevention in the Home Falls can cause injuries. They can happen to people of all ages. There are many things you can do to make your home safe and to help prevent falls. What can I do on the outside of my home? Regularly fix the edges of walkways and driveways and fix any cracks. Remove anything that might make you trip as you walk through a door, such as a raised step or threshold. Trim any bushes or trees on the path to your home. Use bright outdoor lighting. Clear any walking paths of anything that might make someone trip, such as rocks or tools. Regularly check to see if handrails are loose or broken. Make sure that both sides of any steps have handrails. Any raised decks and porches should have guardrails on the edges. Have any leaves, snow, or ice cleared regularly. Use sand or salt on walking paths during winter. Clean up any spills in your garage right away. This includes oil or grease spills. What can I do in the bathroom? Use night lights. Install grab bars by the toilet and in the tub and shower. Do not use towel bars as grab bars. Use non-skid mats or  decals in the tub or shower. If you need to sit down in the shower, use a plastic, non-slip stool. Keep the floor dry. Clean up any water that spills on the floor as soon as it happens. Remove soap buildup in the tub or shower regularly. Attach bath mats securely with double-sided non-slip rug tape. Do not have throw rugs and other things on the floor that can make you trip. What can I do in the bedroom? Use night lights. Make sure that you have a light by your bed that is easy to reach. Do not use any sheets or blankets that are too big for your bed. They should not hang down onto the floor. Have a firm chair that has side arms. You can use this for support while you get dressed. Do not have throw rugs and other things on the floor that can make you trip. What can I do in the kitchen? Clean up any spills right away. Avoid walking on wet floors. Keep items that you use a lot in easy-to-reach places. If you need to reach something above you, use a strong step stool that has a grab bar. Keep electrical cords out of the way. Do not use floor polish or wax that makes floors  slippery. If you must use wax, use non-skid floor wax. Do not have throw rugs and other things on the floor that can make you trip. What can I do with my stairs? Do not leave any items on the stairs. Make sure that there are handrails on both sides of the stairs and use them. Fix handrails that are broken or loose. Make sure that handrails are as long as the stairways. Check any carpeting to make sure that it is firmly attached to the stairs. Fix any carpet that is loose or worn. Avoid having throw rugs at the top or bottom of the stairs. If you do have throw rugs, attach them to the floor with carpet tape. Make sure that you have a light switch at the top of the stairs and the bottom of the stairs. If you do not have them, ask someone to add them for you. What else can I do to help prevent falls? Wear shoes that: Do not  have high heels. Have rubber bottoms. Are comfortable and fit you well. Are closed at the toe. Do not wear sandals. If you use a stepladder: Make sure that it is fully opened. Do not climb a closed stepladder. Make sure that both sides of the stepladder are locked into place. Ask someone to hold it for you, if possible. Clearly mark and make sure that you can see: Any grab bars or handrails. First and last steps. Where the edge of each step is. Use tools that help you move around (mobility aids) if they are needed. These include: Canes. Walkers. Scooters. Crutches. Turn on the lights when you go into a dark area. Replace any light bulbs as soon as they burn out. Set up your furniture so you have a clear path. Avoid moving your furniture around. If any of your floors are uneven, fix them. If there are any pets around you, be aware of where they are. Review your medicines with your doctor. Some medicines can make you feel dizzy. This can increase your chance of falling. Ask your doctor what other things that you can do to help prevent falls. This information is not intended to replace advice given to you by your health care provider. Make sure you discuss any questions you have with your health care provider. Document Released: 04/01/2009 Document Revised: 11/11/2015 Document Reviewed: 07/10/2014 Elsevier Interactive Patient Education  2017 [employer].

## 2023-01-23 ENCOUNTER — Encounter: Payer: Self-pay | Admitting: Professional Counselor

## 2023-01-23 ENCOUNTER — Ambulatory Visit (INDEPENDENT_AMBULATORY_CARE_PROVIDER_SITE_OTHER): Payer: Medicare Other | Admitting: Professional Counselor

## 2023-01-23 DIAGNOSIS — F319 Bipolar disorder, unspecified: Secondary | ICD-10-CM
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
## 2023-01-30 ENCOUNTER — Ambulatory Visit (INDEPENDENT_AMBULATORY_CARE_PROVIDER_SITE_OTHER): Payer: Medicare Other | Admitting: Professional Counselor

## 2023-01-30 ENCOUNTER — Encounter: Payer: Self-pay | Admitting: Professional Counselor

## 2023-01-30 DIAGNOSIS — F319 Bipolar disorder, unspecified: Secondary | ICD-10-CM | POA: Diagnosis not present
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
## 2023-02-06 ENCOUNTER — Ambulatory Visit: Payer: Medicare Other | Admitting: Professional Counselor

## 2023-02-13 ENCOUNTER — Encounter: Payer: Self-pay | Admitting: Professional Counselor

## 2023-02-13 ENCOUNTER — Ambulatory Visit (INDEPENDENT_AMBULATORY_CARE_PROVIDER_SITE_OTHER): Payer: Medicare Other | Admitting: Professional Counselor

## 2023-02-13 DIAGNOSIS — F319 Bipolar disorder, unspecified: Secondary | ICD-10-CM
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
## 2023-02-21 ENCOUNTER — Ambulatory Visit (INDEPENDENT_AMBULATORY_CARE_PROVIDER_SITE_OTHER): Payer: Medicare Other | Admitting: Psychiatry

## 2023-02-21 ENCOUNTER — Encounter: Payer: Self-pay | Admitting: Psychiatry

## 2023-02-21 DIAGNOSIS — F411 Generalized anxiety disorder: Secondary | ICD-10-CM | POA: Diagnosis not present

## 2023-02-21 DIAGNOSIS — F5105 Insomnia due to other mental disorder: Secondary | ICD-10-CM | POA: Diagnosis not present

## 2023-02-21 DIAGNOSIS — F319 Bipolar disorder, unspecified: Secondary | ICD-10-CM | POA: Diagnosis not present

## 2023-02-21 MED ORDER — LORAZEPAM 0.5 MG PO TABS
0.5000 mg | ORAL_TABLET | Freq: Three times a day (TID) | ORAL | 1 refills | Status: DC
Start: 2023-02-21 — End: 2024-01-08

## 2023-02-21 NOTE — Progress Notes (Signed)
Brittany [name] [id number] 10-Sep-1955 67 y.o.     Subjective:   Patient ID:  Brittany [name] is a 67 y.o. (DOB [birth year]) female.  Chief Complaint:  Chief Complaint  Patient presents with   Follow-up    HPI Brittany [name] presents to the office today for follow-up of bipolar disorder.    seen Oct 25, 2018.  For complaints of cognition Wellbutrin was increased to 450 mg each morning.  This was felt to be safer than introducing stimulants.  seen October 2020, the following was noted: Called back a couple of weeks later and wanted to reduce the dosage of Wellbutrin back to 300 mg daily.  It made her feel bad physically on edge.   Isolation is managed..  Mood relatively.  OK.  Has back pain that interferes with normal function.  House is a mess bc can't do much.  Hurt her back in there [employer]. NAC has helped after a couple of months.  Also feels more mellow and nice.  Still problems with concentration.  Asks about meds to help with attention span. Asks about Adderall.  Intermittent problems with forgetfulness and other days she drops the ball. Ok working on self care with walking and watching caffeine and doesn't want to take more meds.  Still living in [place].   Overall mood is pretty good without swings or irritability generally.   No meds were changed.  10/22/2019 appointment, the following is noted: She continues N-acetylcysteine, Wellbutrin XL 300 mg daily, Equetro 600 mg nightly, lamotrigine 300 mg daily, sertraline 50 mg daily. Had to cancel bc H so sick and died 11-08-19.  Haven't been alone.   Hasn't decided about where to live.  Will stay where she is for a year.  People are visiting.  [facility] service will be in June.   Peaceful passing.  The family has been supportive.  His kids have been OK.   Sold the hunting cabin to his boys.   She feels good about how she handled things.  Insurance will pay off the bills and has no mortgage.    Occ periods of racing thoughts and  periods of irrritability or periods of isolation.  Overall though doing well.  Overall mood is pretty good without swings or irritability generally.   Patient reports stable mood and denies depressed or irritable moods.  Patient denies any recent difficulty with anxiety.  Patient denies difficulty with sleep initiation or maintenance with melatonin. Denies appetite disturbance.  Patient reports that energy and motivation have been good.  Patient denies any difficulty with concentration.  Patient denies any suicidal ideation.  Protects sleeep 8-4AM.  Melatonin occ.  Not tired unusually.  06/28/2020 appt noted: Had more anxiety and increased Equetro to 1 twice daily and continued sertraline 50 bc it can make her more hyper.   Very sensitive to alcohol so can't have much of it.   Stopped caffeine unless travels. Anxiety was bad and it is better now. H deceased and problems with his 2 sons but not his daughters. One son with good manners and the other one lacks. Lose phone.   Adjusting pretty well to being a widow.  Can sleep alone and feel OK about it.  More accepting of being alone.  He left her money.  Takes Prevagen and she can tell a difference.   Going to [employer] and will be baptized into the faith. No recent temper problems lately.   Tolerating meds.  Sleep better.  More energy with Prevagen. Still back pain abu tdcan clean house.    12/30/20 appt noted: Getting along OK.  Having to expel H's son's from her property.  Will is supportive of her position.   Disc friend who is paranoid.  Still living in [place].   Recognizes pt had prior history of paranoia with prior manic ipisodes.  No longer has these sx. No problems with the meds. Patient reports stable mood and denies depressed or irritable moods.  Patient denies any recent difficulty with anxiety.  Patient denies difficulty with sleep initiation or maintenance. Denies appetite disturbance.  Patient reports that energy and motivation  have been good.  Patient denies any difficulty with concentration.  Patient denies any suicidal ideation. Satisfied with meds.  No problems with meds. Plan: no med changes  06/27/2021 appointment with the following noted: Takes all meds at night including Wellbutrin. Dogs wake her.  Don't feel safe without the dogs.  Asks if she can ever stop the meds.  Trying to move back to [place]. Not depressed but prone to anxiety and worry. Knows she should exercise. Plan: Cont meds.  No change indicated Continue Equetro 900 mg HS Continue Wellbutrin XL 300 mg every morning.  Option weaning. Defer. Continue sertraline 50 mg daily for panic Continue lamotrigine 150 mg 3 daily for depression Currently she is not using propranolol.  04/05/22 appt noted: Off Wellbutrin but taking other meds noted above.  Stopped bc anxiety. No panic lately. Increased NAC  and Prevagen and caused SE.  Had a fall twice in 8 weeks and was confused. Regular doses were fine and did help. Selling her house.  Haven't decided where yet. Consistent with other meds. Rx meclizine for dizziness. No other problems with meds.   Mood is OK. Plan: Cont meds.  No change indicated Continue Equetro 900 mg HS Off Wellbutrin Dt panic. Continue sertraline 50 mg daily for panic Continue lamotrigine 150 mg 3 daily for depression Currently she is not using propranolol.  10/18/22 appt noted: BC falling without explanation she weaned off all meds.  Figured out it was lamotrigine.  Hasn't falled since or had muscle jerks.   Was paranoid for ahwile but that resolved.  Had a lot of falls at night.  Mind wasn't clear and speech slurred and was dizzy.   Went off all meds in November and felt elated again.  Wondered if it was mania.  Cleaned house and did tasks.  If feels giddy then doesn't drive.   I think I might be ok on supplements.  %HTP 5 daily, SJW, NAC, vit B12, fish oil, MVI. Manages stress and possible  Socializes with [place]  people mostly.  Without meds has tendency to be irritable and hyperverbal. Remembering things better without meds.  Mind clearer without meds. Couldn't sleep off meds.  Called the [place].  Got Pastorino sleep for 12 days and given hydroxyzine 50 mg HS working pretty well.  Only psych med.  Without hydroxyzine feels she might have panic. She didn't think to call hear for help. Started talking to sister who takes hydroxyzine. Plan: Loowest SE generic esp low cognitive risk Abilify .  She wants to try low dose 5 mg daily.  12/05/22 appt noted: Took Abilify for awhile and then started getting clumsy and stopped it. She thinks after 30 years of using med can't seem to take anything.  She doesn't think even low doses of lamotrigine she could take.  Asks for something that is not for  bipolar to tx her off label. Sleep at most 4 hours usually.  Both initial and terminal ins.  Dogs can interfere.   Not trying to be noncompliant but fearful of meds bc she is alone.   Worries Depakote affects thyroid. Exhausted from not sleeping and stressors from moving.   Thinks the next door neighbor is harrassing her shining lights in window at night.  Sometimes panic and sometimes fear at night.  Has big dog so does not fear being attacked.  Plans security system.  Plan: Option clonazepam off label sleep or anxiety.   0.5-1.0 mg HS for sleep she agrees Dog good for her mental health.  02/21/23 appt noted: Problems with sciatica and appt pending. Worries about dog.  Wants dog for support animal to be able to take dog into public places except restaurants. No clonazepam DT hangover and forgetful. Had to remodel kitchen.  So stressful.   Needs to do more and concerned about it.   Sciatica exacerbates anxiety and causes awakening.   Mood stability relatively good otherwise.  Will get down over some things but fighting it.  As long as she can keep functioning she doesn't want new meds. Taking ashwaghanda, 5 HTP, [facility]  Wort with benefit. Chronic ins since early childhood.  Use to sleep pretty well with Jari Favre.  Not used to sleeping alone.   Active at [facility]. Will let people know if I think I'm being taken advantage of.  No psych meds. But is some supplements.   She doesn't want to take other psych meds.   Manages without meds by limiting stress.  If gets in bad mood then isolates.    Past Psychiatric Medication Trials:  naltrexone,  propranolol hydroxyzine 50 mg HS  wellbutrin 450 SE, sertraline 50,  lamotrigine 300,  lithium,  Geodon,   Equetro, Abilify 5 complaining of clumsy  Melatonin hangover at 10 mg  Doxepin Clonazepam 0.5 mg hangover  Review of Systems:  Review of Systems  Constitutional:  Positive for unexpected weight change.  Cardiovascular:  Negative for chest pain and palpitations.  Musculoskeletal:  Positive for back pain, gait problem and neck pain.  Neurological:  Positive for dizziness. Negative for tremors and weakness.  Hematological:  Negative for adenopathy. Does not bruise/bleed easily.  Psychiatric/Behavioral:  Positive for sleep disturbance. Negative for agitation, behavioral problems, confusion, decreased concentration, dysphoric mood, hallucinations, self-injury and suicidal ideas. The patient is nervous/anxious. The patient is not hyperactive.    Still in physical therapy.  Getting nerve block for her back.  Medications: I have reviewed the patient's current medications.  Current Outpatient Medications  Medication Sig Dispense Refill   LORazepam (ATIVAN) 0.5 MG tablet Take 1 tablet (0.5 mg total) by mouth every 8 (eight) hours. 60 tablet 1   meloxicam (MOBIC) 15 MG tablet Take 1 tablet (15 mg total) by mouth daily. 30 tablet 1   Multiple Vitamin (MULTIVITAMIN WITH MINERALS) TABS Take 1 tablet by mouth daily.     diclofenac sodium (VOLTAREN) 1 % GEL Apply 2 g topically 4 (four) times daily. (Patient not taking: Reported on 02/21/2023) 100 g 1   saline  (AYR) GEL Place 1 Application into both nostrils every 6 (six) hours as needed. (Patient not taking: Reported on 02/21/2023) 42.3 g 0   tiZANidine (ZANAFLEX) 2 MG tablet  (Patient not taking: Reported on 02/21/2023)     Vitamin Mixture (ESTER-C PO) Take 1,000 mg by mouth. (Patient not taking: Reported on 02/21/2023)  No current facility-administered medications for this visit.    Medication Side Effects: None  Allergies:  Allergies  Allergen Reactions   Flexeril [Cyclobenzaprine]     Unable to leave home when taking as she is incoherent/unsure where she is at times   Hydrocodone     REACTION: GI upset   Motrin [Ibuprofen]    Nsaids     Other Reaction(s): Renal disease   Prednisone Other (See Comments)   Robaxin [Methocarbamol]     Impairs judgement    Past Medical History:  Diagnosis Date   ACNE NEC    ALLERGIC RHINITIS    ALOPECIA    Anal fissure    Anxiety    Panic Attack.  None in a long time   Arthritis    Asymptomatic microscopic hematuria 12/05/2022   Bowel obstruction (HCC)    Breast mass 12/05/2022   Sep 04, 2008 Entered By: Bennie Pierini B Comment: benign on bx   Chronic headaches    migraine- none in years   Constipation    drinks tea with senna   DEPRESSION    Depression    DISORDER, BIPOLAR NEC    Family history of breast cancer    Family history of pancreatic cancer    GERD (gastroesophageal reflux disease)    HEMORRHOIDS-INTERNAL    History of stress test    ETT 10/17: Ex 7'; no chest pain, no ST changes, [facility] Treadmill Score 7   HYPERLIPIDEMIA    OBSTRUCTIVE SLEEP APNEA    Seasonal allergies     Family History  Problem Relation Age of Onset   Other Paternal Aunt        x 2, blood clotting disorder   Breast cancer Paternal Aunt 21   Heart disease Sister    Kidney disease Maternal Uncle    Esophageal cancer Paternal Uncle    Pancreatic cancer Paternal Uncle    COPD Father    Breast cancer Mother 62   Breast cancer Maternal Grandmother 45    Stroke Maternal Grandfather    Aneurysm Paternal Grandmother        brain   Hypertension Brother    Breast cancer Maternal Aunt        dx in her 11s   Breast cancer Maternal Aunt        dx in her 37s   Cancer Maternal Uncle        NOS   Cancer Maternal Uncle        tumor on his head   Breast cancer Paternal Aunt        dx in her 26s   Breast cancer Paternal Aunt        dx in her 20s   Lung cancer Paternal Uncle    Breast cancer Cousin 16       paternal first cousin   Colon cancer Maternal Uncle    Colon polyps Maternal Uncle    Irritable bowel syndrome Cousin    Rectal cancer Neg Hx    Stomach cancer Neg Hx     Social History   Socioeconomic History   Marital status: Married    Spouse name: Not on file   Number of children: 4   Years of education: Not on file   Highest education level: Not on file  Occupational History   Occupation: disabled    Employer: UNEMPLOYED  Tobacco Use   Smoking status: Former    Current packs/day: 0.00    Average packs/day: 0.1 packs/day for 7.0  years (0.7 ttl pk-yrs)    Types: Cigarettes    Start date: 06/19/1974    Quit date: 06/19/1981    Years since quitting: 41.7   Smokeless tobacco: Never  Vaping Use   Vaping status: Never Used  Substance and Sexual Activity   Alcohol use: No    Comment: rare   Drug use: No   Sexual activity: Not on file  Other Topics Concern   Not on file  Social History Narrative   Not on file   Social Determinants of Health   Financial Resource Strain: Low Risk  (01/22/2023)   Overall Financial Resource Strain (CARDIA)    Difficulty of Paying Living Expenses: Not hard at all  Recent Concern: Financial Resource Strain - Medium Risk (12/11/2022)   Overall Financial Resource Strain (CARDIA)    Difficulty of Paying Living Expenses: Somewhat hard  Food Insecurity: No Food Insecurity (01/22/2023)   Hunger Vital Sign    Worried About Running Out of Food in the Last Year: Never true    Ran Out of Food in the Last  Year: Never true  Transportation Needs: No Transportation Needs (01/22/2023)   PRAPARE - Administrator, Civil Service (Medical): No    Lack of Transportation (Non-Medical): No  Physical Activity: Inactive (01/22/2023)   Exercise Vital Sign    Days of Exercise per Week: 0 days    Minutes of Exercise per Session: 0 min  Stress: Stress Concern Present (01/22/2023)   [employer] of Occupational Health - Occupational Stress Questionnaire    Feeling of Stress : Very much  Social Connections: Moderately Integrated (01/22/2023)   Social Connection and Isolation Panel [NHANES]    Frequency of Communication with Friends and Family: More than three times a week    Frequency of Social Gatherings with Friends and Family: More than three times a week    Attends Religious Services: More than 4 times per year    Active Member of [employer] or Organizations: Yes    Attends Banker Meetings: 1 to 4 times per year    Marital Status: Widowed  Intimate Partner Violence: Not At Risk (01/22/2023)   Humiliation, Afraid, Rape, and Kick questionnaire    Fear of Current or Ex-Partner: No    Emotionally Abused: No    Physically Abused: No    Sexually Abused: No    Past Medical History, Surgical history, Social history, and Family history were reviewed and updated as appropriate.   Please see review of systems for further details on the patient's review from today.   Objective:   Physical Exam:  There were no vitals taken for this visit.  Physical Exam Constitutional:      General: She is not in acute distress.    Appearance: Normal appearance.  Musculoskeletal:        General: No deformity.  Neurological:     Mental Status: She is alert and oriented to person, place, and time.     Coordination: Coordination normal.     Comments: Using cane  Psychiatric:        Attention and Perception: Attention and perception normal.        Mood and Affect: Mood is anxious. Mood is not  depressed. Affect is not labile, angry or inappropriate.        Speech: Speech normal.        Behavior: Behavior normal. Behavior is not slowed.        Thought Content: Thought content normal. Thought  content is not delusional. Thought content does not include homicidal or suicidal ideation. Thought content does not include suicidal plan.        Cognition and Memory: Cognition normal.        Judgment: Judgment normal.     Comments: Insight intact.  Occ irritable. No auditory or visual hallucinations. No delusions.       Lab Review:     Component Value Date/Time   NA 140 12/05/2022 0925   K 4.2 12/05/2022 0925   CL 108 12/05/2022 0925   CO2 27 12/05/2022 0925   GLUCOSE 96 12/05/2022 0925   BUN 9 12/05/2022 0925   CREATININE 0.62 12/05/2022 0925   CALCIUM 10.0 12/05/2022 0925   PROT 7.0 12/05/2022 0925   ALBUMIN 3.9 12/05/2022 0925   AST 21 12/05/2022 0925   ALT 19 12/05/2022 0925   ALKPHOS 102 12/05/2022 0925   BILITOT 0.6 12/05/2022 0925   GFRNONAA >90 03/07/2014 0749   GFRAA >90 03/07/2014 0749       Component Value Date/Time   WBC 5.9 12/05/2022 0925   RBC 4.36 12/05/2022 0925   HGB 13.0 12/05/2022 0925   HCT 39.4 12/05/2022 0925   PLT 324.0 12/05/2022 0925   MCV 90.5 12/05/2022 0925   MCH 30.7 03/07/2014 0749   MCHC 32.9 12/05/2022 0925   RDW 14.3 12/05/2022 0925   LYMPHSABS 2.0 12/05/2022 0925   MONOABS 0.4 12/05/2022 0925   EOSABS 0.1 12/05/2022 0925   BASOSABS 0.0 12/05/2022 0925    Lithium Lvl  Date Value Ref Range Status  06/05/2013 0.59 (L) 0.80 - 1.40 mEq/L Final     No results found for: "PHENYTOIN", "PHENOBARB", "VALPROATE", "CBMZ"   .res Assessment: Plan:    Bipolar I disorder with seasonal pattern (HCC) - Plan: LORazepam (ATIVAN) 0.5 MG tablet  Generalized anxiety disorder - Plan: LORazepam (ATIVAN) 0.5 MG tablet  Insomnia due to mental condition - Plan: LORazepam (ATIVAN) 0.5 MG tablet  30 min face to face time with patient was spent on  counseling and coordination of care.  Very med sensitive by her report.    Mrs. Adami has some mood cycling.  Her husband passed April 2021.  She has had a lot of support.  She is handling it well. [employer] Witness faith has helped.  Disc bipolar dx and need for meds longterm.  Disc treatment plan and SE.  She is concerned about SE with meds and doesn't want to take bipolar meds despite risk without them.  There is some concern over mood-driven thought disruption.  Despite being informed about the sig mental health risks of not taking a mood stabilizer with bipolar disorder that is her decsion.   Disc off label high dose OFA but this is not likely adequate alternative.    She doesn't tolerate clonazepam.  She agrees to trial lorazepam 0.5 mg TID prn.  Disc risk without mood stabilizer.  We discussed the short-term risks associated with benzodiazepines including sedation and increased fall risk among others.  Discussed long-term side effect risk including dependence, potential withdrawal symptoms, and the potential eventual dose-related risk of dementia.  But recent studies from 2020 dispute this association between benzodiazepines and dementia risk. Newer studies in 2020 do not support an association with dementia.  She has benfitted from NAC  She is against Depakote bc fear of it affecting liver. Disc category of atypicals for mood stabilization bc no other options otherwise.  Discussed potential metabolic side effects associated with atypical antipsychotics, as well  as potential risk for movement side effects. Advised pt to contact office if movement side effects occur.   Specifically disc TD risk bc has a cousin with it.   Lowest SE generic esp low cognitive risk potential other DA partial agonists but complained of clumsy with Abilify.    Adjusted to living alone.  FU couple of mos or sooner prn  Meredith Staggers, MD, DFAPA   Please see After Visit Summary for patient specific  instructions.  Future Appointments  Date Time Provider Department Center  02/27/2023  9:00 AM Gaspar Bidding, [facility] CP-CP None  02/27/2023  2:20 PM GI-315 MR 1 GI-315MRI GI-315 W. [facility]  03/02/2023  2:30 PM Zaunegger, Reola Mosher, PT DWB-REH DWB  03/07/2023 10:50 AM Meryl Dare, MD [facility]  03/08/2023  9:30 AM Cottle, Steva Ready., MD CP-CP None  03/09/2023  9:00 AM Gaspar Bidding, [facility] CP-CP None  03/22/2023  1:00 PM Gaspar Bidding, [facility] CP-CP None  04/03/2023  1:00 PM Gaspar Bidding, [facility] CP-CP None  08/10/2023 10:30 AM GI-BCG DX DEXA 1 GI-BCGDG GI-BREAST CE  01/24/2024  1:00 PM [facility] NURSE 2 [facility]-GR None    No orders of the defined types were placed in this encounter.      -------------------------------

## 2023-02-27 ENCOUNTER — Ambulatory Visit (INDEPENDENT_AMBULATORY_CARE_PROVIDER_SITE_OTHER): Payer: Medicare Other | Admitting: Professional Counselor

## 2023-02-27 ENCOUNTER — Encounter: Payer: Self-pay | Admitting: Professional Counselor

## 2023-02-27 ENCOUNTER — Ambulatory Visit: Payer: Medicare Other

## 2023-02-27 DIAGNOSIS — F319 Bipolar disorder, unspecified: Secondary | ICD-10-CM | POA: Diagnosis not present

## 2023-02-27 NOTE — Progress Notes (Unsigned)
      [facility] Counselor/Therapist Progress Note  Patient ID: Brittany [name], MRN: [id number],    Date: 02/27/2023  Time Spent: 9:12a - 10:07a   Treatment Type: Individual Therapy  Reported Symptoms: worries, preoccupying thoughts, tearfulness, stress  Mental Status Exam:  Appearance:   Neat     Behavior:  Appropriate and Sharing  Motor:  Normal  Speech/Language:   Clear and Coherent and Normal Rate  Affect:  Appropriate and Congruent  Mood:  normal  Thought process:  normal  Thought content:    WNL  Sensory/Perceptual disturbances:    WNL  Orientation:  oriented to person, place, time/date, and situation  Attention:  Good  Concentration:  Good  Memory:  WNL  Fund of knowledge:   Good  Insight:    Good  Judgment:   Good  Impulse Control:  Good   Risk Assessment: Danger to Self:  No Self-injurious Behavior: No Danger to Others: No Duty to Warn:no Physical Aggression / Violence:No  Access to Firearms a concern: No  Gang Involvement:No   Subjective: Pt presented to session processing experience of agitation in service setting and counselor assisted with CBT reframe intervention to assistwith thought pattern shift. Pt reported home renovations to continue to be going well albeit to be stressful; she identified excitement regarding the outcome. Pt voiced her walks, tub soaks and caffeine elimination to be helping with warding off anxiety. She reflected on episodes of tearfulness at times when home and alone, and processed experience of life long pattern of self sufficiency, and the blessing and the toll that comes with it. Counselor actively listened, affirmed pt, helped facilitate insight and meaning, and reinforced resouricng and pt access to strengths and support.    Interventions: Cognitive Behavioral Therapy, Solution-Oriented/Positive Psychology, Humanistic/Existential, and Insight-Oriented  Diagnosis:   ICD-10-CM   1. Bipolar I disorder with seasonal pattern (HCC)   F31.9       Plan: Pt is scheduled for a follow-up; continue process work and developing coping skills.   Gaspar Bidding, [facility]

## 2023-03-02 ENCOUNTER — Ambulatory Visit (HOSPITAL_BASED_OUTPATIENT_CLINIC_OR_DEPARTMENT_OTHER): Payer: Medicare Other | Attending: Geriatric Medicine | Admitting: Physical Therapy

## 2023-03-07 ENCOUNTER — Ambulatory Visit (INDEPENDENT_AMBULATORY_CARE_PROVIDER_SITE_OTHER): Payer: Medicare Other | Admitting: Gastroenterology

## 2023-03-07 ENCOUNTER — Encounter: Payer: Self-pay | Admitting: Gastroenterology

## 2023-03-07 VITALS — BP 116/70 | HR 60 | Ht 61.75 in | Wt 184.0 lb

## 2023-03-07 DIAGNOSIS — Z1211 Encounter for screening for malignant neoplasm of colon: Secondary | ICD-10-CM

## 2023-03-07 DIAGNOSIS — Z1212 Encounter for screening for malignant neoplasm of rectum: Secondary | ICD-10-CM

## 2023-03-07 MED ORDER — NA SULFATE-K SULFATE-MG SULF 17.5-3.13-1.6 GM/177ML PO SOLN
1.0000 | Freq: Once | ORAL | 0 refills | Status: AC
Start: 2023-03-07 — End: 2023-03-07

## 2023-03-07 MED ORDER — NA SULFATE-K SULFATE-MG SULF 17.5-3.13-1.6 GM/177ML PO SOLN
1.0000 | Freq: Once | ORAL | 0 refills | Status: DC
Start: 2023-03-07 — End: 2023-03-07

## 2023-03-07 NOTE — Patient Instructions (Signed)
You have been scheduled for a colonoscopy. Please follow written instructions given to you at your visit today.   Please pick up your prep supplies at the pharmacy within the next 1-3 days.  If you use inhalers (even only as needed), please bring them with you on the day of your procedure.  DO NOT TAKE 7 DAYS PRIOR TO TEST- Trulicity (dulaglutide) Ozempic, Wegovy (semaglutide) Mounjaro (tirzepatide) Bydureon Bcise (exanatide extended release)  DO NOT TAKE 1 DAY PRIOR TO YOUR TEST Rybelsus (semaglutide) Adlyxin (lixisenatide) Victoza (liraglutide) Byetta (exanatide) ___________________________________________________________________________   If your blood pressure at your visit was 140/90 or greater, please contact your primary care physician to follow up on this.  _______________________________________________________  If you are age 52 or older, your body mass index should be between 23-30. Your Body mass index is 33.93 kg/m. If this is out of the aforementioned range listed, please consider follow up with your Primary Care Provider.  If you are age 68 or younger, your body mass index should be between 19-25. Your Body mass index is 33.93 kg/m. If this is out of the aformentioned range listed, please consider follow up with your Primary Care Provider.   ________________________________________________________  The [facility] providers would like to encourage you to use [facility] to communicate with providers for non-urgent requests or questions.  Due to long hold times on the telephone, sending your provider a message by [facility] may be a faster and more efficient way to get a response.  Please allow 48 business hours for a response.  Please remember that this is for non-urgent requests.  _______________________________________________________  Thank you for choosing me and [facility].  Venita Lick. Pleas Koch., MD., Clementeen Graham

## 2023-03-07 NOTE — Progress Notes (Signed)
Assessment    CRC screening, average risk S/P cholecystectomy   Recommendations   Schedule screening colonoscopy. The risks (including bleeding, perforation, infection, missed lesions, medication reactions and possible hospitalization or surgery if complications occur), benefits, and alternatives to colonoscopy with possible biopsy and possible polypectomy were discussed with the patient and they consent to proceed.     HPI   Chief complaint: CRC screening, average risk  Patient profile:  Brittany [name] is a 67 y.o. female referred by Avanell Shackleton, NP-C for CRC screening.  She has no ongoing gastrointestinal problems.  Her last colonoscopy was performed in April 2013 as below. Denies weight loss, abdominal pain, constipation, diarrhea, change in stool caliber, melena, hematochezia, nausea, vomiting, dysphagia, reflux symptoms, chest pain.   Previous Labs / Imaging::    Latest Ref Rng & Units 12/05/2022    9:25 AM 04/03/2016   12:01 PM 02/15/2015    8:25 AM  CBC  WBC 4.0 - 10.5 K/uL 5.9  6.6  6.6   Hemoglobin 12.0 - 15.0 g/dL 70.3  50.0  93.8   Hematocrit 36.0 - 46.0 % 39.4  40.5  42.8   Platelets 150.0 - 400.0 K/uL 324.0  322.0  356.0     Lab Results  Component Value Date   LIPASE 69 (H) 01/13/2014      Latest Ref Rng & Units 12/05/2022    9:25 AM 10/17/2017    7:27 AM 01/26/2017   10:30 AM  CMP  Glucose 70 - 99 mg/dL 96   94   BUN 6 - 23 mg/dL 9   7   Creatinine 1.82 - 1.20 mg/dL 9.93  7.16  9.67   Sodium 135 - 145 mEq/L 140   140   Potassium 3.5 - 5.1 mEq/L 4.2   4.1   Chloride 96 - 112 mEq/L 108   108   CO2 19 - 32 mEq/L 27   28   Calcium 8.4 - 10.5 mg/dL 89.3   81.0    17.5   Total Protein 6.0 - 8.3 g/dL 7.0   7.3   Total Bilirubin 0.2 - 1.2 mg/dL 0.6   0.3   Alkaline Phos 39 - 117 U/L 102   117   AST 0 - 37 U/L 21   26   ALT 0 - 35 U/L 19   52      Previous GI evaluation    Endoscopies:  Colonoscopy April 2013 Small hyperplastic  polyp Internal hemorrhoids   Imaging:  MR ABDOMEN WWO CONTRAST CLINICAL DATA:  Follow-up cystic renal lesions.  EXAM: MRI ABDOMEN WITHOUT AND WITH CONTRAST  TECHNIQUE: Multiplanar multisequence MR imaging of the abdomen was performed both before and after the administration of intravenous contrast.  CONTRAST:  8 mL Vueway  COMPARISON:  10/17/2017 and 02/24/2016  FINDINGS: Lower chest: No acute findings.  Hepatobiliary: No hepatic masses identified. Prior cholecystectomy. No evidence of biliary obstruction.  Pancreas:  No mass or inflammatory changes.  Spleen:  Within normal limits in size and appearance.  Adrenals/Urinary Tract: Normal adrenal glands. Several benign Bosniak category 1 and 2 renal cysts are seen bilaterally, largest in the right kidney measuring 3.7 cm. These are similar in appearance to previous studies. No complex cystic or solid renal masses are identified. No evidence of hydronephrosis.  Stomach/Bowel: Unremarkable.  Vascular/Lymphatic: No pathologically enlarged lymph nodes identified. No acute vascular findings.  Other:  None.  Musculoskeletal:  No suspicious bone lesions identified.  IMPRESSION: Bilateral benign Bosniak  category 1 and 2 renal cysts (no followup imaging is recommended). No evidence of renal neoplasm or hydronephrosis.  Electronically Signed   By: Danae Orleans M.D.   On: 04/04/2022 09:39    Past Medical History:  Diagnosis Date   ACNE NEC    ALLERGIC RHINITIS    ALOPECIA    Anal fissure    Anxiety    Panic Attack.  None in a long time   Arthritis    Asymptomatic microscopic hematuria 12/05/2022   Bowel obstruction (HCC)    Breast mass 12/05/2022   Sep 04, 2008 Entered By: Bennie Pierini B Comment: benign on bx   Chronic headaches    migraine- none in years   Constipation    drinks tea with senna   DEPRESSION    Depression    DISORDER, BIPOLAR NEC    Family history of breast cancer    Family history of  pancreatic cancer    GERD (gastroesophageal reflux disease)    HEMORRHOIDS-INTERNAL    History of stress test    ETT 10/17: Ex 7'; no chest pain, no ST changes, [facility] Treadmill Score 7   HYPERLIPIDEMIA    OBSTRUCTIVE SLEEP APNEA    Seasonal allergies    Past Surgical History:  Procedure Laterality Date   BREAST LUMPECTOMY Right last done 2005   x 2   CHOLECYSTECTOMY N/A 03/07/2014   Procedure: LAPAROSCOPIC CHOLECYSTECTOMY WITH INTRAOPERATIVE CHOLANGIOGRAM;  Surgeon: Claud Kelp, MD;  Location: [facility] OR;  Service: General;  Laterality: N/A;   EUS N/A 01/27/2014   Procedure: ESOPHAGEAL ENDOSCOPIC ULTRASOUND (EUS) RADIAL;  Surgeon: Willis Modena, MD;  Location: [facility] ENDOSCOPY;  Service: Endoscopy;  Laterality: N/A;   EYE SURGERY  08/2021   Cataract surgey   HAND SURGERY Bilateral    for arthritis  left 02/02/14- right approx 2014   KNEE SURGERY Bilateral    right x 2 left x 1.  arthroscopy   RADICAL HYSTERECTOMY     thumb and pinkie finger surgery Right 06/19/2012   TONSILLECTOMY     Family History  Problem Relation Age of Onset   Other Paternal Aunt        x 2, blood clotting disorder   Breast cancer Paternal Aunt 61   Heart disease Sister    Kidney disease Maternal Uncle    Esophageal cancer Paternal Uncle    Pancreatic cancer Paternal Uncle    COPD Father    Breast cancer Mother 28   Breast cancer Maternal Grandmother 45   Stroke Maternal Grandfather    Aneurysm Paternal Grandmother        brain   Hypertension Brother    Breast cancer Maternal Aunt        dx in her 38s   Breast cancer Maternal Aunt        dx in her 65s   Cancer Maternal Uncle        NOS   Cancer Maternal Uncle        tumor on his head   Breast cancer Paternal Aunt        dx in her 40s   Breast cancer Paternal Aunt        dx in her 63s   Lung cancer Paternal Uncle    Breast cancer Cousin 1       paternal first cousin   Colon cancer Maternal Uncle    Colon polyps Maternal Uncle    Irritable  bowel syndrome Cousin    Rectal cancer Neg Hx  Stomach cancer Neg Hx    Social History   Tobacco Use   Smoking status: Former    Current packs/day: 0.00    Average packs/day: 0.1 packs/day for 7.0 years (0.7 ttl pk-yrs)    Types: Cigarettes    Start date: 06/19/1974    Quit date: 06/19/1981    Years since quitting: 41.7   Smokeless tobacco: Never  Vaping Use   Vaping status: Never Used  Substance Use Topics   Alcohol use: No    Comment: rare   Drug use: No   Current Outpatient Medications  Medication Sig Dispense Refill   diclofenac sodium (VOLTAREN) 1 % GEL Apply 2 g topically 4 (four) times daily. 100 g 1   meloxicam (MOBIC) 15 MG tablet Take 1 tablet (15 mg total) by mouth daily. 30 tablet 1   Multiple Vitamin (MULTIVITAMIN WITH MINERALS) TABS Take 1 tablet by mouth daily.     tiZANidine (ZANAFLEX) 2 MG tablet      Vitamin Mixture (ESTER-C PO) Take 1,000 mg by mouth.     LORazepam (ATIVAN) 0.5 MG tablet Take 1 tablet (0.5 mg total) by mouth every 8 (eight) hours. (Patient not taking: Reported on 03/07/2023) 60 tablet 1   saline (AYR) GEL Place 1 Application into both nostrils every 6 (six) hours as needed. (Patient not taking: Reported on 03/07/2023) 42.3 g 0   No current facility-administered medications for this visit.   Allergies  Allergen Reactions   Flexeril [Cyclobenzaprine]     Unable to leave home when taking as she is incoherent/unsure where she is at times   Hydrocodone     REACTION: GI upset   Motrin [Ibuprofen]    Nsaids     Other Reaction(s): Renal disease   Prednisone Other (See Comments)   Robaxin [Methocarbamol]     Impairs judgement    Review of Systems: All other systems reviewed and negative except where noted in HPI.    Physical Exam    Wt Readings from Last 3 Encounters:  03/07/23 184 lb (83.5 kg)  01/22/23 182 lb 12.8 oz (82.9 kg)  12/05/22 182 lb (82.6 kg)    BP 116/70 (BP Location: Left Arm, Patient Position: Sitting, Cuff Size:  Normal)   Pulse 60   Ht 5' 1.75" (1.568 m)   Wt 184 lb (83.5 kg)   SpO2 97%   BMI 33.93 kg/m  Constitutional:  Generally well appearing female in no acute distress. HEENT: Pupils normal.  Conjunctivae are normal. No scleral icterus. No oral lesions or deformities noted.  Neck: Supple.  Cardiac: Normal rate, regular rhythm without murmurs. Pulmonary/chest: Effort normal and breath sounds normal. No wheezing, rales or rhonchi. Abdominal: Soft, nondistended, nontender. Active bowel sounds. No palpable HSM, masses or hernias. Rectal: Deferred to colonoscopy Extremities: No edema or deformities noted Neurological: Alert and oriented to person, place and time. Psychiatric: Pleasant. Normal mood and affect. Behavior is normal. Skin: Skin is warm and dry. No rashes noted.  Claudette Head, MD   cc:  Referring Provider Avanell Shackleton, NP-C

## 2023-03-08 ENCOUNTER — Encounter: Payer: Self-pay | Admitting: Psychiatry

## 2023-03-08 ENCOUNTER — Ambulatory Visit (INDEPENDENT_AMBULATORY_CARE_PROVIDER_SITE_OTHER): Payer: Medicare Other | Admitting: Psychiatry

## 2023-03-08 DIAGNOSIS — F411 Generalized anxiety disorder: Secondary | ICD-10-CM | POA: Diagnosis not present

## 2023-03-08 DIAGNOSIS — F5105 Insomnia due to other mental disorder: Secondary | ICD-10-CM

## 2023-03-08 DIAGNOSIS — F319 Bipolar disorder, unspecified: Secondary | ICD-10-CM

## 2023-03-08 NOTE — Progress Notes (Signed)
Brittany [name] [id number] Jul 01, 1955 67 y.o.     Subjective:   Patient ID:  Brittany [name] is a 67 y.o. (DOB [birth year]) female.  Chief Complaint:  Chief Complaint  Patient presents with   Follow-up    Mood and anxiety    HPI Brittany [name] presents to the office today for follow-up of bipolar disorder.    seen Oct 25, 2018.  For complaints of cognition Wellbutrin was increased to 450 mg each morning.  This was felt to be safer than introducing stimulants.  seen October 2020, the following was noted: Called back a couple of weeks later and wanted to reduce the dosage of Wellbutrin back to 300 mg daily.  It made her feel bad physically on edge.   Isolation is managed..  Mood relatively.  OK.  Has back pain that interferes with normal function.  House is a mess bc can't do much.  Hurt her back in there [employer]. NAC has helped after a couple of months.  Also feels more mellow and nice.  Still problems with concentration.  Asks about meds to help with attention span. Asks about Adderall.  Intermittent problems with forgetfulness and other days she drops the ball. Ok working on self care with walking and watching caffeine and doesn't want to take more meds.  Still living in [place].   Overall mood is pretty good without swings or irritability generally.   No meds were changed.  10/22/2019 appointment, the following is noted: She continues N-acetylcysteine, Wellbutrin XL 300 mg daily, Equetro 600 mg nightly, lamotrigine 300 mg daily, sertraline 50 mg daily. Had to cancel bc H so sick and died Nov 01, 2019.  Haven't been alone.   Hasn't decided about where to live.  Will stay where she is for a year.  People are visiting.  [facility] will be in June.   Peaceful passing.  The family has been supportive.  His kids have been OK.   Sold the hunting cabin to his boys.   She feels good about how she handled things.  Insurance will pay off the bills and has no mortgage.    Occ periods of  racing thoughts and periods of irrritability or periods of isolation.  Overall though doing well.  Overall mood is pretty good without swings or irritability generally.   Patient reports stable mood and denies depressed or irritable moods.  Patient denies any recent difficulty with anxiety.  Patient denies difficulty with sleep initiation or maintenance with melatonin. Denies appetite disturbance.  Patient reports that energy and motivation have been good.  Patient denies any difficulty with concentration.  Patient denies any suicidal ideation.  Protects sleeep 8-4AM.  Melatonin occ.  Not tired unusually.  06/28/2020 appt noted: Had more anxiety and increased Equetro to 1 twice daily and continued sertraline 50 bc it can make her more hyper.   Very sensitive to alcohol so can't have much of it.   Stopped caffeine unless travels. Anxiety was bad and it is better now. H deceased and problems with his 2 sons but not his daughters. One son with good manners and the other one lacks. Lose phone.   Adjusting pretty well to being a widow.  Can sleep alone and feel OK about it.  More accepting of being alone.  He left her money.  Takes Prevagen and she can tell a difference.   Going to [employer] and will be baptized into the faith. No recent temper problems lately.  Tolerating meds.  Sleep better.  More energy with Prevagen. Still back pain abu tdcan clean house.    12/30/20 appt noted: Getting along OK.  Having to expel H's son's from her property.  Will is supportive of her position.   Disc friend who is paranoid.  Still living in [place].   Recognizes pt had prior history of paranoia with prior manic ipisodes.  No longer has these sx. No problems with the meds. Patient reports stable mood and denies depressed or irritable moods.  Patient denies any recent difficulty with anxiety.  Patient denies difficulty with sleep initiation or maintenance. Denies appetite disturbance.  Patient reports that  energy and motivation have been good.  Patient denies any difficulty with concentration.  Patient denies any suicidal ideation. Satisfied with meds.  No problems with meds. Plan: no med changes  06/27/2021 appointment with the following noted: Takes all meds at night including Wellbutrin. Dogs wake her.  Don't feel safe without the dogs.  Asks if she can ever stop the meds.  Trying to move back to . Not depressed but prone to anxiety and worry. Knows she should exercise. Plan: Cont meds.  No change indicated Continue Equetro 900 mg HS Continue Wellbutrin XL 300 mg every morning.  Option weaning. Defer. Continue sertraline 50 mg daily for panic Continue lamotrigine 150 mg 3 daily for depression Currently she is not using propranolol.  04/05/22 appt noted: Off Wellbutrin but taking other meds noted above.  Stopped bc anxiety. No panic lately. Increased NAC  and Prevagen and caused SE.  Had a fall twice in 8 weeks and was confused. Regular doses were fine and did help. Selling her house.  Haven't decided where yet. Consistent with other meds. Rx meclizine for dizziness. No other problems with meds.   Mood is OK. Plan: Cont meds.  No change indicated Continue Equetro 900 mg HS Off Wellbutrin Dt panic. Continue sertraline 50 mg daily for panic Continue lamotrigine 150 mg 3 daily for depression Currently she is not using propranolol.  10/18/22 appt noted: BC falling without explanation she weaned off all meds.  Figured out it was lamotrigine.  Hasn't falled since or had muscle jerks.   Was paranoid for ahwile but that resolved.  Had a lot of falls at night.  Mind wasn't clear and speech slurred and was dizzy.   Went off all meds in November and felt elated again.  Wondered if it was mania.  Cleaned house and did tasks.  If feels giddy then doesn't drive.   I think I might be ok on supplements.  %HTP 5 daily, SJW, NAC, vit B12, fish oil, MVI. Manages stress and possible  Socializes  with [place] people mostly.  Without meds has tendency to be irritable and hyperverbal. Remembering things better without meds.  Mind clearer without meds. Couldn't sleep off meds.  Called the [place].  Got [place] sleep for 12 days and given hydroxyzine 50 mg HS working pretty well.  Only psych med.  Without hydroxyzine feels she might have panic. She didn't think to call hear for help. Started talking to sister who takes hydroxyzine. Plan: [place] SE generic esp low cognitive risk Abilify .  She wants to try low dose 5 mg daily.  12/05/22 appt noted: Took Abilify for awhile and then started getting clumsy and stopped it. She thinks after 30 years of using med can't seem to take anything.  She doesn't think even low doses of lamotrigine she could take.  Asks  for something that is not for bipolar to tx her off label. Sleep at most 4 hours usually.  Both initial and terminal ins.  Dogs can interfere.   Not trying to be noncompliant but fearful of meds bc she is alone.   Worries Depakote affects thyroid. Exhausted from not sleeping and stressors from moving.   Thinks the next door neighbor is harrassing her shining lights in window at night.  Sometimes panic and sometimes fear at night.  Has big dog so does not fear being attacked.  Plans security system.  Plan: Option clonazepam off label sleep or anxiety.   0.5-1.0 mg HS for sleep she agrees Dog good for her mental health.  02/21/23 appt noted: Problems with sciatica and appt pending. Worries about dog.  Wants dog for support animal to be able to take dog into public places except restaurants. No clonazepam DT hangover and forgetful. Had to remodel kitchen.  So stressful.   Needs to do more and concerned about it.   Sciatica exacerbates anxiety and causes awakening.   Mood stability relatively good otherwise.  Will get down over some things but fighting it.  As long as she can keep functioning she doesn't want new meds. Taking ashwaghanda, 5  HTP, [place] John's Wort with benefit. Chronic ins since early childhood.  Use to sleep pretty well with Jari Favre.  Not used to sleeping alone.   Active at [facility]. Will let people know if I think I'm being taken advantage of.  No psych meds. But is some supplements.   She doesn't want to take other psych meds.   Manages without meds by limiting stress.  If gets in bad mood then isolates.    03/08/23 appt noted: Disc emotional support dog request, Cordelia.  Corgie mix.  Dog is calm.   Has not tried lorazepam yet.  Has it for emergencies.   When can't sleep plays Psalms quietly.  Helps. No mania.  No sig dep.  Some anxiety at times.   Some anxiety situationally and in her new house.   No psych meds.  Is willing to consider in future.  Says attn is better and perception is better of others and better with money.  She feels she handles others better.  Real sensitive to meds. Chronic conflict between her  and B and sister.  Past Psychiatric Medication Trials:  naltrexone,  propranolol hydroxyzine 50 mg HS  wellbutrin 450 SE, sertraline 50,  lamotrigine 300,  lithium,  Geodon,   Equetro, Abilify 5 complaining of clumsy  Melatonin hangover at 10 mg  Doxepin Clonazepam 0.5 mg hangover  B very anxious  Review of Systems:  Review of Systems  Constitutional:  Positive for unexpected weight change.  Cardiovascular:  Negative for chest pain and palpitations.  Musculoskeletal:  Positive for back pain, gait problem and neck pain.  Neurological:  Negative for tremors.  Hematological:  Negative for adenopathy. Does not bruise/bleed easily.  Psychiatric/Behavioral:  Positive for sleep disturbance. Negative for agitation, behavioral problems, confusion, decreased concentration, dysphoric mood, hallucinations, self-injury and suicidal ideas. The patient is nervous/anxious. The patient is not hyperactive.    Still in physical therapy.  Getting nerve block for her back.  Medications: I  have reviewed the patient's current medications.  Current Outpatient Medications  Medication Sig Dispense Refill   meloxicam (MOBIC) 15 MG tablet Take 1 tablet (15 mg total) by mouth daily. 30 tablet 1   Multiple Vitamin (MULTIVITAMIN WITH MINERALS) TABS Take 1 tablet by mouth  daily.     Vitamin Mixture (ESTER-C PO) Take 1,000 mg by mouth.     diclofenac sodium (VOLTAREN) 1 % GEL Apply 2 g topically 4 (four) times daily. (Patient not taking: Reported on 03/08/2023) 100 g 1   LORazepam (ATIVAN) 0.5 MG tablet Take 1 tablet (0.5 mg total) by mouth every 8 (eight) hours. (Patient not taking: Reported on 03/08/2023) 60 tablet 1   saline (AYR) GEL Place 1 Application into both nostrils every 6 (six) hours as needed. (Patient not taking: Reported on 03/08/2023) 42.3 g 0   tiZANidine (ZANAFLEX) 2 MG tablet  (Patient not taking: Reported on 03/08/2023)     No current facility-administered medications for this visit.    Medication Side Effects: None  Allergies:  Allergies  Allergen Reactions   Flexeril [Cyclobenzaprine]     Unable to leave home when taking as she is incoherent/unsure where she is at times   Hydrocodone     REACTION: GI upset   Motrin [Ibuprofen]    Nsaids     Other Reaction(s): Renal disease   Prednisone Other (See Comments)   Robaxin [Methocarbamol]     Impairs judgement    Past Medical History:  Diagnosis Date   ACNE NEC    ALLERGIC RHINITIS    ALOPECIA    Anal fissure    Anxiety    Panic Attack.  None in a long time   Arthritis    Asymptomatic microscopic hematuria 12/05/2022   Bowel obstruction (HCC)    Breast mass 12/05/2022   Sep 04, 2008 Entered By: Bennie Pierini B Comment: benign on bx   Chronic headaches    migraine- none in years   Constipation    drinks tea with senna   DEPRESSION    Depression    DISORDER, BIPOLAR NEC    Family history of breast cancer    Family history of pancreatic cancer    GERD (gastroesophageal reflux disease)     HEMORRHOIDS-INTERNAL    History of stress test    ETT 10/17: Ex 7'; no chest pain, no ST changes, [facility] Treadmill Score 7   HYPERLIPIDEMIA    OBSTRUCTIVE SLEEP APNEA    Seasonal allergies     Family History  Problem Relation Age of Onset   Other Paternal Aunt        x 2, blood clotting disorder   Breast cancer Paternal Aunt 11   Heart disease Sister    Kidney disease Maternal Uncle    Esophageal cancer Paternal Uncle    Pancreatic cancer Paternal Uncle    COPD Father    Breast cancer Mother 5   Breast cancer Maternal Grandmother 45   Stroke Maternal Grandfather    Aneurysm Paternal Grandmother        brain   Hypertension Brother    Breast cancer Maternal Aunt        dx in her 41s   Breast cancer Maternal Aunt        dx in her 42s   Cancer Maternal Uncle        NOS   Cancer Maternal Uncle        tumor on his head   Breast cancer Paternal Aunt        dx in her 88s   Breast cancer Paternal Aunt        dx in her 3s   Lung cancer Paternal Uncle    Breast cancer Cousin 42       paternal first cousin  Colon cancer Maternal Uncle    Colon polyps Maternal Uncle    Irritable bowel syndrome Cousin    Rectal cancer Neg Hx    Stomach cancer Neg Hx     Social History   Socioeconomic History   Marital status: Married    Spouse name: Not on file   Number of children: 4   Years of education: Not on file   Highest education level: Not on file  Occupational History   Occupation: disabled    Employer: UNEMPLOYED  Tobacco Use   Smoking status: Former    Current packs/day: 0.00    Average packs/day: 0.1 packs/day for 7.0 years (0.7 ttl pk-yrs)    Types: Cigarettes    Start date: 06/19/1974    Quit date: 06/19/1981    Years since quitting: 41.7   Smokeless tobacco: Never  Vaping Use   Vaping status: Never Used  Substance and Sexual Activity   Alcohol use: No    Comment: rare   Drug use: No   Sexual activity: Not on file  Other Topics Concern   Not on file  Social  History Narrative   Not on file   Social Determinants of Health   Financial Resource Strain: Low Risk  (01/22/2023)   Overall Financial Resource Strain (CARDIA)    Difficulty of Paying Living Expenses: Not hard at all  Recent Concern: Financial Resource Strain - Medium Risk (12/11/2022)   Overall Financial Resource Strain (CARDIA)    Difficulty of Paying Living Expenses: Somewhat hard  Food Insecurity: No Food Insecurity (01/22/2023)   Hunger Vital Sign    Worried About Running Out of Food in the Last Year: Never true    Ran Out of Food in the Last Year: Never true  Transportation Needs: No Transportation Needs (01/22/2023)   PRAPARE - Administrator, Civil Service (Medical): No    Lack of Transportation (Non-Medical): No  Physical Activity: Inactive (01/22/2023)   Exercise Vital Sign    Days of Exercise per Week: 0 days    Minutes of Exercise per Session: 0 min  Stress: Stress Concern Present (01/22/2023)   [employer] of Occupational Health - Occupational Stress Questionnaire    Feeling of Stress : Very much  Social Connections: Moderately Integrated (01/22/2023)   Social Connection and Isolation Panel [NHANES]    Frequency of Communication with Friends and Family: More than three times a week    Frequency of Social Gatherings with Friends and Family: More than three times a week    Attends Religious Services: More than 4 times per year    Active Member of [employer] or Organizations: Yes    Attends Banker Meetings: 1 to 4 times per year    Marital Status: Widowed  Intimate Partner Violence: Not At Risk (01/22/2023)   Humiliation, Afraid, Rape, and Kick questionnaire    Fear of Current or Ex-Partner: No    Emotionally Abused: No    Physically Abused: No    Sexually Abused: No    Past Medical History, Surgical history, Social history, and Family history were reviewed and updated as appropriate.   Please see review of systems for further details on the patient's  review from today.   Objective:   Physical Exam:  There were no vitals taken for this visit.  Physical Exam Constitutional:      General: She is not in acute distress.    Appearance: Normal appearance.  Musculoskeletal:  General: No deformity.  Neurological:     Mental Status: She is alert and oriented to person, place, and time.     Coordination: Coordination normal.     Comments: Using cane  Psychiatric:        Attention and Perception: Attention and perception normal.        Mood and Affect: Mood is anxious. Mood is not depressed. Affect is not labile, angry or inappropriate.        Speech: Speech normal. Speech is not rapid and pressured.        Behavior: Behavior normal. Behavior is not slowed.        Thought Content: Thought content normal. Thought content is not delusional. Thought content does not include homicidal or suicidal ideation. Thought content does not include suicidal plan.        Cognition and Memory: Cognition normal.        Judgment: Judgment normal.     Comments: Insight intact.  Occ irritable. No auditory or visual hallucinations. No delusions.  Talkative without  pressure.       Lab Review:     Component Value Date/Time   NA 140 12/05/2022 0925   K 4.2 12/05/2022 0925   CL 108 12/05/2022 0925   CO2 27 12/05/2022 0925   GLUCOSE 96 12/05/2022 0925   BUN 9 12/05/2022 0925   CREATININE 0.62 12/05/2022 0925   CALCIUM 10.0 12/05/2022 0925   PROT 7.0 12/05/2022 0925   ALBUMIN 3.9 12/05/2022 0925   AST 21 12/05/2022 0925   ALT 19 12/05/2022 0925   ALKPHOS 102 12/05/2022 0925   BILITOT 0.6 12/05/2022 0925   GFRNONAA >90 03/07/2014 0749   GFRAA >90 03/07/2014 0749       Component Value Date/Time   WBC 5.9 12/05/2022 0925   RBC 4.36 12/05/2022 0925   HGB 13.0 12/05/2022 0925   HCT 39.4 12/05/2022 0925   PLT 324.0 12/05/2022 0925   MCV 90.5 12/05/2022 0925   MCH 30.7 03/07/2014 0749   MCHC 32.9 12/05/2022 0925   RDW 14.3 12/05/2022 0925    LYMPHSABS 2.0 12/05/2022 0925   MONOABS 0.4 12/05/2022 0925   EOSABS 0.1 12/05/2022 0925   BASOSABS 0.0 12/05/2022 0925    Lithium Lvl  Date Value Ref Range Status  06/05/2013 0.59 (L) 0.80 - 1.40 mEq/L Final     No results found for: "PHENYTOIN", "PHENOBARB", "VALPROATE", "CBMZ"   .res Assessment: Plan:    Bipolar I disorder with seasonal pattern (HCC)  Generalized anxiety disorder  Insomnia due to mental condition  30 min face to face time with patient was spent on counseling and coordination of care.  Very med sensitive by her report.     Her husband passed April 2021.  She has had a lot of support.  She is handling it well. [employer]'s Witness faith has helped.  Disc bipolar dx and need for meds longterm.  Disc treatment plan and SE.  She is concerned about SE with meds and doesn't want to take bipolar meds despite risk without them.  There is some concern over mood-driven thought disruption.  Despite being informed about the sig mental health risks of not taking a mood stabilizer with bipolar disorder that is her decsion.  She feels her thought is sharper off meds and handling people better. Disc off label high dose OFA but this is not likely adequate alternative.    She doesn't tolerate clonazepam.  She agrees to consider trial lorazepam 0.5 mg TID prn.  Disc risk without mood stabilizer. Off for several months without overt mania yet.    We discussed the short-term risks associated with benzodiazepines including sedation and increased fall risk among others.  Discussed long-term side effect risk including dependence, potential withdrawal symptoms, and the potential eventual dose-related risk of dementia.  But recent studies from 2020 dispute this association between benzodiazepines and dementia risk. Newer studies in 2020 do not support an association with dementia.  She has benfitted from NAC  She is against Depakote bc fear of it affecting liver. Disc category of  atypicals for mood stabilization bc no other options otherwise.  Discussed potential metabolic side effects associated with atypical antipsychotics, as well as potential risk for movement side effects. Advised pt to contact office if movement side effects occur.   Specifically disc TD risk bc has a cousin with it.   Lowest SE generic esp low cognitive risk potential other DA partial agonists but complained of clumsy with Abilify.    Adjusted to living alone.  FU 6 mos bc ok without meds for a long time.    Meredith Staggers, MD, DFAPA   Please see After Visit Summary for patient specific instructions.  Future Appointments  Date Time Provider Department Center  03/22/2023  1:00 PM Gaspar Bidding [facility] CP-CP None  04/03/2023  1:00 PM Gaspar Bidding, [facility] CP-CP None  04/10/2023 11:00 AM Gaspar Bidding, [facility] CP-CP None  04/16/2023  9:00 AM Meryl Dare, MD [facility]  08/10/2023 10:30 AM GI-BCG DX DEXA 1 GI-BCGDG GI-BREAST CE  09/17/2023  9:00 AM Cottle, Steva Ready., MD CP-CP None  01/24/2024  1:00 PM [facility] NURSE 2 [facility]-GR None    No orders of the defined types were placed in this encounter.      -------------------------------

## 2023-03-09 ENCOUNTER — Ambulatory Visit: Payer: Medicare Other | Admitting: Professional Counselor

## 2023-03-22 ENCOUNTER — Ambulatory Visit: Payer: Medicare Other | Admitting: Professional Counselor

## 2023-03-22 ENCOUNTER — Encounter: Payer: Self-pay | Admitting: Professional Counselor

## 2023-03-22 DIAGNOSIS — F319 Bipolar disorder, unspecified: Secondary | ICD-10-CM | POA: Diagnosis not present

## 2023-03-22 NOTE — Progress Notes (Signed)
      [facility] Counselor/Therapist Progress Note  Patient ID: Brittany [name], MRN: [id number],    Date: 03/22/2023  Time Spent: 1:06p - 2:08p   Treatment Type: Individual Therapy  Reported Symptoms: tearfulness, hypervigilance, preoccupying thoughts, worries  Mental Status Exam:  Appearance:   Neat     Behavior:  Appropriate and Sharing  Motor:  Normal  Speech/Language:   Clear and Coherent and Normal Rate  Affect:  Tearful  Mood:  Animated   Thought process:  normal  Thought content:    WNL  Sensory/Perceptual disturbances:    WNL  Orientation:  Sound  Attention:  Good  Concentration:  Good  Memory:  WNL  Fund of knowledge:   Good  Insight:    Good  Judgment:   Good  Impulse Control:  Good   Risk Assessment: Danger to Self:  No Self-injurious Behavior: No Danger to Others: No Duty to Warn:no Physical Aggression / Violence:No  Access to Firearms a concern: No  Gang Involvement:No   Subjective: Pt voiced home renovations to be stabilizing and to be relieved while also stressed by continuing to manage people and projects. 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 She processed reasons for dismissal and voiced what she is looking for in a helper going forward. Counselor affirmed pt self insight into her comfort levels and needs, and her self advocacy as a result. Pt identified stocking up on provisions, and other efforts regarding security such as a generator, in case of limitations to access of goods and resources. Pt reflected on nightmares she has with themes of scarcity and fear of failure, and she traced these themes as they have resonated with aspects of her life experience by hx. Counselor held space for pt processing, grief and loss, and actively listened.   Interventions: Solution-Oriented/Positive Psychology, Humanistic/Existential, and Insight-Oriented  Diagnosis:   ICD-10-CM   1. Bipolar I disorder with  seasonal pattern (HCC)  F31.9       Plan: Pt is scheduled for a follow-up; continue process work and developing coping skills.   Gaspar Bidding, [facility]

## 2023-04-03 ENCOUNTER — Encounter: Payer: Self-pay | Admitting: Professional Counselor

## 2023-04-03 ENCOUNTER — Ambulatory Visit: Payer: Medicare Other | Admitting: Professional Counselor

## 2023-04-03 DIAGNOSIS — F319 Bipolar disorder, unspecified: Secondary | ICD-10-CM | POA: Diagnosis not present

## 2023-04-03 DIAGNOSIS — F411 Generalized anxiety disorder: Secondary | ICD-10-CM

## 2023-04-03 NOTE — Progress Notes (Addendum)
      [facility] Counselor/Therapist Progress Note  Patient ID: Brittany [name], MRN: [id number],    Date: 04/03/2023  Time Spent: 1:09p - 2:07p   Treatment Type: Individual Therapy  Reported Symptoms: tearfulness, anhedonia, low mood, fatigue, trouble concentrating, nervousness, trouble relaxing, restlessness, irritability, isolating tendencies  Mental Status Exam:  Appearance:   Neat     Behavior:  Appropriate and Sharing  Motor:  Normal  Speech/Language:   Clear and Coherent and Normal Rate  Affect:  Appropriate and Congruent  Mood:  normal  Thought process:  normal  Thought content:    WNL  Sensory/Perceptual disturbances:    WNL  Orientation:  Sound  Attention:  Good  Concentration:  Good  Memory:  WNL  Fund of knowledge:   Good  Insight:    Good  Judgment:   Good  Impulse Control:  Good   Risk Assessment: Danger to Self:  No Self-injurious Behavior: No Danger to Others: No Duty to Warn:no Physical Aggression / Violence:No  Access to Firearms a concern: No  Gang Involvement:No   Subjective: Pt reported having new home health aide and being pleased with the relationship. She reported having had a cyst removed from her finger and for it to be healing well. Pt reported taking CBN gummies and serum for sleep ad anxiety respectively, and finding it helpful for symptoms; counselor recommended pt discussing use with her psychiatrist. Pt reported to be exercising more regularly and for her improved self care to be beneficial to symptoms. Counselor and pt discussed pt progress. Pt identified continuing to have anxious and depressive symptoms, particularly regarding tearfulness at home and social avoidance. Counselor facilitated PHQ9 (9) and GAD7 (10), and she and pt discussed results. Pt voiced intention to join virtual support group through the [place]; she expressed hopefulness for its impact on symptomology in conjunction with individual therapy, which she identified as helpful.    Interventions: Solution-Oriented/Positive Psychology, Humanistic/Existential, and Insight-Oriented  Diagnosis:   ICD-10-CM   1. Bipolar I disorder with seasonal pattern (HCC)  F31.9     2. Generalized anxiety disorder  F41.1       Plan: Pt is scheduled for a follow-up; continue process work and developing coping skills.   Gaspar Bidding, [facility]

## 2023-04-05 ENCOUNTER — Ambulatory Visit: Payer: Medicare Other | Admitting: Professional Counselor

## 2023-04-10 ENCOUNTER — Ambulatory Visit: Payer: Medicare Other | Admitting: Professional Counselor

## 2023-04-11 ENCOUNTER — Ambulatory Visit: Payer: Medicare Other | Admitting: Professional Counselor

## 2023-04-11 ENCOUNTER — Encounter: Payer: Self-pay | Admitting: Professional Counselor

## 2023-04-11 DIAGNOSIS — F411 Generalized anxiety disorder: Secondary | ICD-10-CM

## 2023-04-11 DIAGNOSIS — F319 Bipolar disorder, unspecified: Secondary | ICD-10-CM | POA: Diagnosis not present

## 2023-04-11 NOTE — Progress Notes (Signed)
      [facility] Counselor/Therapist Progress Note  Patient ID: Brittany [name], MRN: [id number],    Date: 04/18/2023  Time Spent: 11:16a - 12:13p   Treatment Type: Individual Therapy  Reported Symptoms: fearfulness, restlessness, emotional dysregulation, angry outbursts, stress  Mental Status Exam:  Appearance:   Casual     Behavior:  Appropriate and Sharing  Motor:  Normal  Speech/Language:   Clear and Coherent and Normal Rate  Affect:  Appropriate and Congruent  Mood:  normal  Thought process:  normal  Thought content:    WNL  Sensory/Perceptual disturbances:    WNL  Orientation:  Sound  Attention:  Good  Concentration:  Good  Memory:  WNL  Fund of knowledge:   Good  Insight:    Good  Judgment:   Good  Impulse Control:  Good   Risk Assessment: Danger to Self:  No Self-injurious Behavior: No Danger to Others: No Duty to Warn:no Physical Aggression / Violence:No  Access to Firearms a concern: No  Gang Involvement:No   Subjective: Pt presented to session voicing significant stress regarding home owning issue in crawlspace. She identified how she was taking care of the situation and counselor affirmed pt efforts and self advocacy. Pt brought in pictures of the past to share with counselor, and shared about her relationship hx and impact on her life and well-being. Pt processed her experience of racism across the lifespan, and other experiences that she feels have exacerbated her symptoms of bipolar. She identified her depression and mania to present in rapid cycling form, and said she fears her rages and anger. However she voiced that she has been doing better, and that therapy helps for her to have an outlet and to learn coping strategies. Counselor offered psychoeducation regarding the window of tolerance as relates emotional regulation skills.  Interventions: Solution-Oriented/Positive Psychology, Humanistic/Existential, and Insight-Oriented,  Psychoeducational  Diagnosis:   ICD-10-CM   1. Generalized anxiety disorder  F41.1     2. Bipolar I disorder with seasonal pattern (HCC)  F31.9       Plan: Pt is scheduled for a follow-up; continue process work and developing coping skills.   Gaspar Bidding, [facility]

## 2023-04-12 NOTE — Addendum Note (Signed)
Addended by: Bartolo Darter T on: 04/12/2023 05:06 PM   Modules accepted: Level of Service

## 2023-04-16 ENCOUNTER — Encounter: Payer: Medicare Other | Admitting: Gastroenterology

## 2023-04-16 ENCOUNTER — Telehealth: Payer: Self-pay | Admitting: Gastroenterology

## 2023-04-16 NOTE — Telephone Encounter (Signed)
Good Morning Dr. Russella Dar,  I called this patient at 8:20 am today and she stated she tried calling on Saturday and This morning to let us know she did not have a ride to take her here.  She stated  she would call back to reschedule.  I will NO SHOW her.  Medicare

## 2023-04-26 ENCOUNTER — Encounter: Payer: Self-pay | Admitting: Professional Counselor

## 2023-04-26 ENCOUNTER — Ambulatory Visit: Payer: Medicare Other | Admitting: Professional Counselor

## 2023-04-26 DIAGNOSIS — F411 Generalized anxiety disorder: Secondary | ICD-10-CM

## 2023-04-26 DIAGNOSIS — F319 Bipolar disorder, unspecified: Secondary | ICD-10-CM | POA: Diagnosis not present

## 2023-04-26 NOTE — Progress Notes (Signed)
      [facility] Counselor/Therapist Progress Note  Patient ID: Brittany [name], MRN: [id number],    Date: 04/26/2023  Time Spent: 11:12a - 12:12p   Treatment Type: Individual Therapy  Reported Symptoms: fears, stress, worries, agitation, trauma response pattern   Mental Status Exam:  Appearance:   Casual     Behavior:  Appropriate and Sharing  Motor:  Normal  Speech/Language:   Clear and Coherent and Normal Rate  Affect:  Appropriate and Congruent  Mood:  normal  Thought process:  normal  Thought content:    WNL  Sensory/Perceptual disturbances:    WNL  Orientation:  Sound  Attention:  Good  Concentration:  Good  Memory:  WNL  Fund of knowledge:   Good  Insight:    Good  Judgment:   Good  Impulse Control:  Good   Risk Assessment: Danger to Self:  No Self-injurious Behavior: No Danger to Others: No Duty to Warn:no Physical Aggression / Violence:No  Access to Firearms a concern: No  Gang Involvement:No   Subjective: Pt presented to session to address concerns of anxiety and rapid cycling of depressive and agitated mood; she identified minimal progress at this time. [facility], however reported that political stress was heightened and reminding her of trauma from decades earlier. She voiced considerable concern for loved ones and preoccupation with safety. Counselor affirmed pt and actively listened, and helped pt identify coping strategies such as limiting additional exposure to harmful news and circumstances. Counselor and pt discussed treatment plan and reviewed, with pt giving verbal consent, however signing inoperable per technical difficulties.   Interventions: Solution-Oriented/Positive Psychology, Humanistic/Existential, and Insight-Oriented  Diagnosis:   ICD-10-CM   1. Generalized anxiety disorder  F41.1     2. Bipolar I disorder with seasonal pattern (HCC)  F31.9       Plan: Pt is scheduled for a follow-up;  continue process work and developing coping skills. Obtain signature for discussed and verbally consented treatment plan. Pt to resource protective factors at this time of acute stress.  Gaspar Bidding, [facility]

## 2023-05-03 ENCOUNTER — Ambulatory Visit: Payer: Medicare Other | Admitting: Professional Counselor

## 2023-05-10 ENCOUNTER — Encounter: Payer: Self-pay | Admitting: Professional Counselor

## 2023-05-10 ENCOUNTER — Ambulatory Visit: Payer: Medicare Other | Admitting: Professional Counselor

## 2023-05-10 DIAGNOSIS — F411 Generalized anxiety disorder: Secondary | ICD-10-CM | POA: Diagnosis not present

## 2023-05-10 DIAGNOSIS — F319 Bipolar disorder, unspecified: Secondary | ICD-10-CM | POA: Diagnosis not present

## 2023-05-10 NOTE — Progress Notes (Signed)
      [facility] Counselor/Therapist Progress Note  Patient ID: Brittany [name], MRN: [id number],    Date: 05/10/2023  Time Spent: 10:08 AM to 11:07 AM  Treatment Type: Individual Therapy  Reported Symptoms: Sleeplessness, tearfulness, night terrors, fear, anger, panic attacks, worries, restlessness, grief/loss  Mental Status Exam:  Appearance:   Casual     Behavior:  Appropriate and Sharing  Motor:  Normal  Speech/Language:   Clear and Coherent and Normal Rate  Affect:  Appropriate and Congruent  Mood:  normal  Thought process:  normal  Thought content:    WNL  Sensory/Perceptual disturbances:    WNL  Orientation:  Sound  Attention:  Good  Concentration:  Good  Memory:  WNL  Fund of knowledge:   Good  Insight:    Good  Judgment:   Good  Impulse Control:  Good   Risk Assessment: Danger to Self:  No Self-injurious Behavior: No Danger to Others: No Duty to Warn:no Physical Aggression / Violence:No  Access to Firearms a concern: No  Gang Involvement:No   Subjective: Patient presented to session to address concerns of anxiety and bipolar disorder.  She reported limited progress at this time.  Patient reported having panic attacks that transition into worry states.  She voiced that CBG self medication helps with panic attacks and "not going off on people" per manic episodes.  She voiced that she generally feels "bottled up" but that the CBG helps her relax.  Patient processed grief and loss per relational history and family of origin.  Counselor affirmed patient feelings and experience, actively listened, helped to facilitate insight, and she and patient strategized patient's self-care objectives between sessions.  Counselor encouraged patient to discuss use of CBG with prescribing physician.  Interventions: Solution-Oriented/Positive Psychology, Humanistic/Existential, and Insight-Oriented  Diagnosis:   ICD-10-CM   1. Bipolar I disorder with seasonal pattern (HCC)  F31.9      2. Generalized anxiety disorder  F41.1       Plan: Patient is scheduled for a follow-up; continue process work and developing coping skills.  Patient to continue short-term goal of prioritizing self-care objectives and rest.  Progress note was dictated via Dragon and checked for accuracy.  Gaspar Bidding, [facility]

## 2023-05-16 ENCOUNTER — Ambulatory Visit: Payer: Medicare Other | Admitting: Professional Counselor

## 2023-05-23 ENCOUNTER — Ambulatory Visit: Payer: Medicare Other | Admitting: Professional Counselor

## 2023-05-23 ENCOUNTER — Encounter: Payer: Self-pay | Admitting: Professional Counselor

## 2023-05-23 DIAGNOSIS — F319 Bipolar disorder, unspecified: Secondary | ICD-10-CM

## 2023-05-23 DIAGNOSIS — F411 Generalized anxiety disorder: Secondary | ICD-10-CM

## 2023-05-23 NOTE — Progress Notes (Signed)
      [facility] Counselor/Therapist Progress Note  Patient ID: Brittany [name], MRN: [id number],    Date: 05/23/2023  Time Spent: 10:11 AM to 11:08 AM  Treatment Type: Individual Therapy  Reported Symptoms: fears, nervousness, emotional dysregulation, interpersonal concerns, worries, restlessness, stress  Mental Status Exam:  Appearance:   Casual     Behavior:  Appropriate and Sharing  Motor:  Normal  Speech/Language:   Clear and Coherent and Normal Rate  Affect:  Appropriate and Congruent  Mood:  anxious  Thought process:  normal  Thought content:    WNL  Sensory/Perceptual disturbances:    WNL  Orientation:  Sound  Attention:  Good  Concentration:  Good  Memory:  WNL  Fund of knowledge:   Good  Insight:    Good  Judgment:   Good  Impulse Control:  Good   Risk Assessment: Danger to Self:  No Self-injurious Behavior: No Danger to Others: No Duty to Warn:no Physical Aggression / Violence:No  Access to Firearms a concern: No  Gang Involvement:No   Subjective: Patient presented to session to address concerns of bipolar disorder and anxiety.  Patient voiced limited progress at this time.  She voiced considerable stress around politics and projected implications, communications with her sister, and worries regarding her best friend, and symptomology as described above.  Counselor helped facilitate insight around patient concerns, and to develop self care and interpersonal strategies to help alleviate symptoms.  Patient also reported having taken a fall while moving her dog's bed.  Counselor and patient discussed patient obtaining increased home health support.  Patient reported her use of CBG and CBH to help with her anxiety and depression, and pain and inflammation symptoms.  Counselor and patient discussed treatment plan in EHR, and patient gave her consent.  Patient identified short term goal of continuing to work to repair relationship with sister, and prioritize self-care  versus efforts of over-functioning.    Interventions: Solution-Oriented/Positive Psychology, Humanistic/Existential, and Insight-Oriented  Diagnosis:   ICD-10-CM   1. Bipolar I disorder with seasonal pattern (HCC)  F31.9     2. Generalized anxiety disorder  F41.1       Plan: Patient is scheduled for follow-up; continue process work and developing coping skills.  Patient to address short-term goal between sessions as identified above.  Progress note was dictated via Dragon and reviewed for accuracy.  Gaspar Bidding, [facility]

## 2023-05-30 NOTE — Therapy (Signed)
OUTPATIENT PHYSICAL THERAPY THORACOLUMBAR EVALUATION   Patient Name: Brittany [name] MRN: [id number] DOB:04-15-1956, 67 y.o., female Today's Date: 05/31/2023  END OF SESSION:  PT End of Session - 05/31/23 1327     Visit Number 1    Number of Visits 16    Date for PT Re-Evaluation 08/10/23    Authorization Type mcr    Progress Note Due on Visit 10    PT Start Time 1201    PT Stop Time 1240    PT Time Calculation (min) 39 min    Activity Tolerance Patient limited by pain    Behavior During Therapy WFL for tasks assessed/performed             Past Medical History:  Diagnosis Date   ACNE NEC    ALLERGIC RHINITIS    ALOPECIA    Anal fissure    Anxiety    Panic Attack.  None in a long time   Arthritis    Asymptomatic microscopic hematuria 12/05/2022   Bowel obstruction (HCC)    Breast mass 12/05/2022   Sep 04, 2008 Entered By: Bennie Pierini B Comment: benign on bx   Chronic headaches    migraine- none in years   Constipation    drinks tea with senna   DEPRESSION    Depression    DISORDER, BIPOLAR NEC    Family history of breast cancer    Family history of pancreatic cancer    GERD (gastroesophageal reflux disease)    HEMORRHOIDS-INTERNAL    History of stress test    ETT 10/17: Ex 7'; no chest pain, no ST changes, [facility] Treadmill Score 7   HYPERLIPIDEMIA    OBSTRUCTIVE SLEEP APNEA    Seasonal allergies    Past Surgical History:  Procedure Laterality Date   BREAST LUMPECTOMY Right last done 2005   x 2   CHOLECYSTECTOMY N/A 03/07/2014   Procedure: LAPAROSCOPIC CHOLECYSTECTOMY WITH INTRAOPERATIVE CHOLANGIOGRAM;  Surgeon: Claud Kelp, MD;  Location: [facility] OR;  Service: General;  Laterality: N/A;   EUS N/A 01/27/2014   Procedure: ESOPHAGEAL ENDOSCOPIC ULTRASOUND (EUS) RADIAL;  Surgeon: Willis Modena, MD;  Location: [facility] END[facility];  Service: Endoscopy;  Laterality: N/A;   EYE SURGERY  08/2021   Cataract surgey   HAND SURGERY Bilateral    for arthritis  left  02/02/14- right approx 2014   KNEE SURGERY Bilateral    right x 2 left x 1.  arthroscopy   RADICAL HYSTERECTOMY     thumb and pinkie finger surgery Right 06/19/2012   TONSILLECTOMY     Patient Active Problem List   Diagnosis Date Noted   Chronic pain syndrome 12/05/2022   Urinary incontinence 12/05/2022   Plantar fasciitis 12/05/2022   Insomnia 12/05/2022   Dry skin 12/05/2022   Dizziness and giddiness 12/05/2022   Cataract 12/05/2022   Anxiety 12/05/2022   Chloasma 12/05/2022   Cyst of kidney, acquired 12/05/2022   Cyst of thyroid 12/05/2022   Chronic sinusitis, unspecified 09/06/2022   Spinal stenosis of lumbar region 01/10/2022   Ex-cigarette smoker 08/23/2021   Tobacco dependence in remission 06/09/2020   Spondylolysis 05/27/2018   Unspecified inflammatory spondylopathy, lumbar region (HCC) 05/27/2018   Lumbar spondylosis 04/15/2018   Other hyperlipidemia 06/21/2017   Acquired absence of other genital organ(s) 06/05/2017   Headache 01/26/2017   Neck pain 11/07/2016   Radon exposure 04/03/2016   Allergic rhinitis 02/15/2016   Genetic testing 09/09/2015   Primary osteoarthritis of right knee 09/09/2015   Family history of breast  cancer    Family history of pancreatic cancer    Chronic bilateral low back pain without sciatica 04/20/2015   Trochanteric bursitis of left hip 04/20/2015   Cervical myofascial pain syndrome 04/20/2015   Medicare annual wellness visit, subsequent 02/10/2015   Gallstones 02/16/2014   Hyperparathyroidism (HCC) 01/29/2013   Chronic back pain 10/21/2012   ALOPECIA 03/05/2009   Obstructive sleep apnea 10/06/2008   HEMORRHOIDS-INTERNAL 04/28/2008   History of colonic polyps 04/24/2008   Hyperlipidemia LDL goal <100 03/20/2007   DISORDER, BIPOLAR NEC 03/20/2007   Depression 03/20/2007    PCP: Hetty Blend NP  REFERRING PROVIDER: Venita Lick MD  REFERRING DIAG: M54.51 (ICD-10-CM) - Vertebrogenic low back pain   Rationale for Evaluation  and Treatment: Rehabilitation  THERAPY DIAG:  Other low back pain  Muscle weakness (generalized)  Abnormal posture  Other abnormalities of gait and mobility  ONSET DATE: >40 yrs  SUBJECTIVE:                                                                                                                                                                                           SUBJECTIVE STATEMENT: I am trying therapy to avoid back surgery.  [facility]  Had massages before in last home state and it helped for a [place] while. Has aide 11 hours a week. She helps me bath. Bending putting dishes in dishwasher, getting laundry out of dryer. New In town in past year.   PERTINENT HISTORY:  KNEE oa receiving gel shots Chronic pain syndrome Bi-polar  PAIN:  Are you having pain? Yes: NPRS scale: 8/10; worst 8/10 least 09/26/08 Pain location: LB with radiation into left foot sometimes right Pain description: piercing down left leg > right Aggravating factors: bending, twisting, sitting upright, cold, standing x 5 mins; walking 5-10 min Relieving factors: meds  PRECAUTIONS: None  RED FLAGS: None   WEIGHT BEARING RESTRICTIONS: No  FALLS:  Has patient fallen in last 6 months? No  LIVING ENVIRONMENT: Lives with: lives alone Lives in: House/apartment Stairs: No Has following equipment at home: Single point cane, Environmental consultant - 4 wheeled, Marine scientist  OCCUPATION: disabled  PLOF: Needs assistance with ADLs and Needs assistance with homemaking  PATIENT GOALS: decrease pain; walk better  NEXT MD VISIT: 4-5 weeks  OBJECTIVE:  Note: Objective measures were completed at Evaluation unless otherwise noted.  DIAGNOSTIC FINDINGS:  MRI of the lumbar spine as well as x-rays lumbar spine region. She does have a grade 1 spondylolisthesis on plain films on the lateral view. Significant spondylitic disease at L4 5 greater than L5 1  PATIENT  SURVEYS:  FOTO Primary measure 24%; risk adjusted 42%; goal 40%  COGNITION: Overall cognitive status: Within functional limits for tasks assessed     SENSATION: WFL  MUSCLE LENGTH: Hamstrings: bilaterally tight visualized in sitting   POSTURE: rounded shoulders, forward head, and decreased lumbar lordosis  PALPATION: TTP  LUMBAR ROM:  High pain sensitivity limiting lumbar ROM AROM eval  Flexion 100% limited  Extension 100% limited  Right lateral flexion 100% limited  Left lateral flexion 90% limited  Right rotation   Left rotation    (Blank rows = not tested)  LOWER EXTREMITY ROM:      Not tested due to pain sensitivity  LOWER EXTREMITY MMT:     Not tested due to pain sensitivity. At least 3/5 in hip and knee   LUMBAR SPECIAL TESTS:  Not tolerated  FUNCTIONAL TESTS:  5 times sit to stand: TBA Timed up and go (TUG): 25.85 3 stage balance: NBOS needs assist gaining position. 3 MWT:161ft 1 standing rest period, no ad  GAIT: Distance walked: 123 Assistive device utilized: None pt encouraged to use rollator when coming to sessions for toleration to exercise as well as safety.  She VU Level of assistance: SBA Comments: Increased time spent in stance bilaterally, lateral displacement  TODAY'S TREATMENT:                                                                                                                              eval   PATIENT EDUCATION:  Education details: Discussed eval findings, rehab rationale, aquatic program progression/POC and pools in area. Patient is in agreement  Person educated: Patient Education method: Explanation Education comprehension: verbalized understanding  HOME EXERCISE PROGRAM: tba  ASSESSMENT:  CLINICAL IMPRESSION: Patient is a 67 y.o. f who was seen today for physical therapy evaluation and treatment for LBP. She has a long hx of chronic back pain dating back into the 1970's.  She reports multiple episodes of PT  throughout the years which have helped to ease her pain.  She is new in town in past year and just getting settled in new home.  Pain exacerbation due some to unpacking.  She does have an aide 11 hours a week to assist with ADL's and household chores.  She reports limitations where any static standing or bending/twisting is involved.  Relies on aide to help her with bathing.  She has had aquatic therapy in past.  Does not yet have access to a pool.  She is given list of area pools and is encouraged to check them out.  Her pain sensitivity is high today and she does not tolerate much objective or functional testing.  Will plan to test as pt can better tolerate. She is significantly limited in all areas and will benefit from skilled PT intervention to improve deficits, decrease and manage pain, as well as improve safety and QOL.  OBJECTIVE IMPAIRMENTS: Abnormal gait, decreased activity tolerance, decreased balance, decreased  endurance, decreased knowledge of condition, decreased knowledge of use of DME, decreased mobility, difficulty walking, decreased ROM, decreased strength, impaired perceived functional ability, impaired flexibility, postural dysfunction, obesity, and pain.   ACTIVITY LIMITATIONS: carrying, lifting, bending, sitting, standing, squatting, sleeping, stairs, transfers, bed mobility, dressing, hygiene/grooming, and locomotion level  PARTICIPATION LIMITATIONS: meal prep, cleaning, laundry, shopping, community activity, and yard work  PERSONAL FACTORS: Past/current experiences, Time since onset of injury/illness/exacerbation, and 1-2 comorbidities: see problem list  are also affecting patient's functional outcome.   REHAB POTENTIAL: Good  CLINICAL DECISION MAKING: Evolving/moderate complexity  EVALUATION COMPLEXITY: Moderate   GOALS: Goals reviewed with patient? Yes  SHORT TERM GOALS: Target date: 06/29/23  Pt will tolerate full aquatic sessions consistently without increase in pain  and with improving function to demonstrate good toleration and effectiveness of intervention.  Baseline: Goal status: INITIAL  2.  Pt will amb to and from setting consistently using ad along with completing entire aquatics session without excessive fatigue or pain Baseline:  Goal status: INITIAL  3.  Pt will complete tandem and SLS x >15s in 3.6 ft with ue support as needed Baseline:  Goal status: INITIAL  4.  Pt to be indep with initial aquatic HEP and gain access to personal pool for completion Baseline: pt given area pool handout Goal status: INITIAL   LONG TERM GOALS: Target date: 08/10/23  Pt to improve Foto core by 10% to demonstrate improved perceived functional ability Baseline: 24% Goal status: INITIAL  2.  Pt will improve LE strength to at least 4/5 to demonstrate improved toleration to testing and increase safety with function Baseline:  Goal status: INITIAL  3.  Pt will stand NBOS and semi tandem x 20s to demonstrate improve balance Baseline:  Goal status: INITIAL  4.  Pt will report decrease in LBP with amb by 3-4 NPRS to demonstrated improved ability to tolerate ADL's. Baseline:  Goal status: INITIAL  5.  Pt will improve on 3 MWT to 257ft demonstrating improved toleration to amb in home environment Baseline:  Goal status: INITIAL  6. Pt will improve on Tug test to <or= 18s to demonstrate improvement in lower extremity function, mobility and decreased fall risk. Baseline: 25.85 Goal status: INITIAL  PLAN:  PT FREQUENCY: 2x/week  PT DURATION: 10 weeks  PLANNED INTERVENTIONS: 97164- PT Re-evaluation, 97110-Therapeutic exercises, 97530- Therapeutic activity, 97112- Neuromuscular re-education, 97535- Self Care, 16109- Manual therapy, [phone]- Gait training, [phone]- Orthotic Fit/training, [phone]- Aquatic Therapy, [phone]- Electrical stimulation (unattended), [phone]- Ionotophoresis 4mg /ml Dexamethasone, Patient/Family education, Balance training, Stair training,  Taping, Dry Needling, Joint mobilization, DME instructions, Cryotherapy, and Moist heat.  PLAN FOR NEXT SESSION: Aquatics only: pain management/relief, general strengthening, stretching, gait and balance retraining, assist with gaining access to pool   [place] Tomma Lightning) Ashton Sabine MPT 05/31/23 1:36 PM [facility] [address] [place], [place], [address] Phone: [phone]   Fax:  [phone]

## 2023-05-31 ENCOUNTER — Other Ambulatory Visit: Payer: Self-pay

## 2023-05-31 ENCOUNTER — Encounter (HOSPITAL_BASED_OUTPATIENT_CLINIC_OR_DEPARTMENT_OTHER): Payer: Self-pay | Admitting: Physical Therapy

## 2023-05-31 ENCOUNTER — Ambulatory Visit (HOSPITAL_BASED_OUTPATIENT_CLINIC_OR_DEPARTMENT_OTHER): Payer: Medicare Other | Attending: Geriatric Medicine | Admitting: Physical Therapy

## 2023-05-31 DIAGNOSIS — R2689 Other abnormalities of gait and mobility: Secondary | ICD-10-CM | POA: Diagnosis present

## 2023-05-31 DIAGNOSIS — M5459 Other low back pain: Secondary | ICD-10-CM | POA: Insufficient documentation

## 2023-05-31 DIAGNOSIS — M6281 Muscle weakness (generalized): Secondary | ICD-10-CM | POA: Diagnosis present

## 2023-05-31 DIAGNOSIS — R293 Abnormal posture: Secondary | ICD-10-CM | POA: Insufficient documentation

## 2023-06-04 ENCOUNTER — Ambulatory Visit (HOSPITAL_BASED_OUTPATIENT_CLINIC_OR_DEPARTMENT_OTHER): Payer: Medicare Other | Admitting: Physical Therapy

## 2023-06-10 ENCOUNTER — Ambulatory Visit
Admission: RE | Admit: 2023-06-10 | Discharge: 2023-06-10 | Disposition: A | Discharge status: Home / Self Care | Payer: Medicare Other | Source: Ambulatory Visit | Attending: Family Medicine | Admitting: Family Medicine

## 2023-06-10 DIAGNOSIS — R92333 Mammographic heterogeneous density, bilateral breasts: Secondary | ICD-10-CM

## 2023-06-10 DIAGNOSIS — Z9889 Other specified postprocedural states: Secondary | ICD-10-CM

## 2023-06-10 MED ORDER — GADOPICLENOL 0.5 MMOL/ML IV SOLN
8.0000 mL | Freq: Once | INTRAVENOUS | Status: AC | PRN
  Administered 2023-06-10: 8 mL via INTRAVENOUS

## 2023-06-11 ENCOUNTER — Encounter (HOSPITAL_BASED_OUTPATIENT_CLINIC_OR_DEPARTMENT_OTHER): Payer: Self-pay | Admitting: Physical Therapy

## 2023-06-11 ENCOUNTER — Ambulatory Visit (HOSPITAL_BASED_OUTPATIENT_CLINIC_OR_DEPARTMENT_OTHER): Payer: Medicare Other | Admitting: Physical Therapy

## 2023-06-11 DIAGNOSIS — R293 Abnormal posture: Secondary | ICD-10-CM

## 2023-06-11 DIAGNOSIS — M6281 Muscle weakness (generalized): Secondary | ICD-10-CM

## 2023-06-11 DIAGNOSIS — M5459 Other low back pain: Secondary | ICD-10-CM | POA: Diagnosis not present

## 2023-06-11 NOTE — Therapy (Signed)
OUTPATIENT PHYSICAL THERAPY THORACOLUMBAR EVALUATION   Patient Name: RENATHA MUSHINSKI MRN: [id number] DOB:Oct 07, 1955, 67 y.o., female Today's Date: 06/11/2023  END OF SESSION:  PT End of Session - 06/11/23 0913     Visit Number 2    Number of Visits 16    Date for PT Re-Evaluation 08/10/23    Authorization Type mcr    Progress Note Due on Visit 10    PT Start Time 0901    PT Stop Time 0945    PT Time Calculation (min) 44 min    Activity Tolerance Patient limited by pain;Patient tolerated treatment well    Behavior During Therapy [facility] for tasks assessed/performed             Past Medical History:  Diagnosis Date   ACNE NEC    ALLERGIC RHINITIS    ALOPECIA    Anal fissure    Anxiety    Panic Attack.  None in a long time   Arthritis    Asymptomatic microscopic hematuria 12/05/2022   Bowel obstruction (HCC)    Breast mass 12/05/2022   Sep 04, 2008 Entered By: Bennie Pierini B Comment: benign on bx   Chronic headaches    migraine- none in years   Constipation    drinks tea with senna   DEPRESSION    Depression    DISORDER, BIPOLAR NEC    Family history of breast cancer    Family history of pancreatic cancer    GERD (gastroesophageal reflux disease)    HEMORRHOIDS-INTERNAL    History of stress test    ETT 10/17: Ex 7'; no chest pain, no ST changes, [facility] Treadmill Score 7   HYPERLIPIDEMIA    OBSTRUCTIVE SLEEP APNEA    Seasonal allergies    Past Surgical History:  Procedure Laterality Date   BREAST LUMPECTOMY Right last done 2005   x 2   CHOLECYSTECTOMY N/A 03/07/2014   Procedure: LAPAROSCOPIC CHOLECYSTECTOMY WITH INTRAOPERATIVE CHOLANGIOGRAM;  Surgeon: Claud Kelp, MD;  Location: [facility] OR;  Service: General;  Laterality: N/A;   EUS N/A 01/27/2014   Procedure: ESOPHAGEAL ENDOSCOPIC ULTRASOUND (EUS) RADIAL;  Surgeon: Willis Modena, MD;  Location: [facility] ENDOSCOPY;  Service: Endoscopy;  Laterality: N/A;   EYE SURGERY  08/2021   Cataract surgey   HAND SURGERY  Bilateral    for arthritis  left 02/02/14- right approx 2014   KNEE SURGERY Bilateral    right x 2 left x 1.  arthroscopy   RADICAL HYSTERECTOMY     thumb and pinkie finger surgery Right 06/19/2012   TONSILLECTOMY     Patient Active Problem List   Diagnosis Date Noted   Chronic pain syndrome 12/05/2022   Urinary incontinence 12/05/2022   Plantar fasciitis 12/05/2022   Insomnia 12/05/2022   Dry skin 12/05/2022   Dizziness and giddiness 12/05/2022   Cataract 12/05/2022   Anxiety 12/05/2022   Chloasma 12/05/2022   Cyst of kidney, acquired 12/05/2022   Cyst of thyroid 12/05/2022   Chronic sinusitis, unspecified 09/06/2022   Spinal stenosis of lumbar region 01/10/2022   Ex-cigarette smoker 08/23/2021   Tobacco dependence in remission 06/09/2020   Spondylolysis 05/27/2018   Unspecified inflammatory spondylopathy, lumbar region (HCC) 05/27/2018   Lumbar spondylosis 04/15/2018   Other hyperlipidemia 06/21/2017   Acquired absence of other genital organ(s) 06/05/2017   Headache 01/26/2017   Neck pain 11/07/2016   Radon exposure 04/03/2016   Allergic rhinitis 02/15/2016   Genetic testing 09/09/2015   Primary osteoarthritis of right knee 09/09/2015   Family  history of breast cancer    Family history of pancreatic cancer    Chronic bilateral low back pain without sciatica 04/20/2015   Trochanteric bursitis of left hip 04/20/2015   Cervical myofascial pain syndrome 04/20/2015   Medicare annual wellness visit, subsequent 02/10/2015   Gallstones 02/16/2014   Hyperparathyroidism (HCC) 01/29/2013   Chronic back pain 10/21/2012   ALOPECIA 03/05/2009   Obstructive sleep apnea 10/06/2008   HEMORRHOIDS-INTERNAL 04/28/2008   History of colonic polyps 04/24/2008   Hyperlipidemia LDL goal <100 03/20/2007   DISORDER, BIPOLAR NEC 03/20/2007   Depression 03/20/2007    PCP: Hetty Blend NP  REFERRING PROVIDER: Venita Lick MD  REFERRING DIAG: M54.51 (ICD-10-CM) - Vertebrogenic low back  pain   Rationale for Evaluation and Treatment: Rehabilitation  THERAPY DIAG:  Other low back pain  Muscle weakness (generalized)  Abnormal posture  ONSET DATE: >40 yrs  SUBJECTIVE:                                                                                                                                                                                           SUBJECTIVE STATEMENT: Pain is bad.  Initial subjective I am trying therapy to avoid back surgery.  [facility]  Had massages before in last home state and it helped for a [place] while. Has aide 11 hours a week. She helps me bath. Bending putting dishes in dishwasher, getting laundry out of dryer. New In town in past year.   PERTINENT HISTORY:  KNEE oa receiving gel shots Chronic pain syndrome Bi-polar  PAIN:  Are you having pain? Yes: NPRS scale: 8/10; worst 8/10 least 09/26/08 Pain location: LB with radiation into left foot sometimes right Pain description: piercing down left leg > right Aggravating factors: bending, twisting, sitting upright, cold, standing x 5 mins; walking 5-10 min Relieving factors: meds  PRECAUTIONS: None  RED FLAGS: None   WEIGHT BEARING RESTRICTIONS: No  FALLS:  Has patient fallen in last 6 months? No  LIVING ENVIRONMENT: Lives with: lives alone Lives in: House/apartment Stairs: No Has following equipment at home: Single point cane, Environmental consultant - 4 wheeled, Marine scientist  OCCUPATION: disabled  PLOF: Needs assistance with ADLs and Needs assistance with homemaking  PATIENT GOALS: decrease pain; walk better  NEXT MD VISIT: 4-5 weeks  OBJECTIVE:  Note: Objective measures were completed at Evaluation unless otherwise noted.  DIAGNOSTIC FINDINGS:  MRI of the lumbar spine as well as x-rays lumbar spine region. She does have a grade 1 spondylolisthesis on plain films on the lateral view. Significant spondylitic disease at L4 5 greater  than  L5 1   PATIENT SURVEYS:  FOTO Primary measure 24%; risk adjusted 42%; goal 40%  COGNITION: Overall cognitive status: Within functional limits for tasks assessed     SENSATION: WFL  MUSCLE LENGTH: Hamstrings: bilaterally tight visualized in sitting   POSTURE: rounded shoulders, forward head, and decreased lumbar lordosis  PALPATION: TTP  LUMBAR ROM:  High pain sensitivity limiting lumbar ROM AROM eval  Flexion 100% limited  Extension 100% limited  Right lateral flexion 100% limited  Left lateral flexion 90% limited  Right rotation   Left rotation    (Blank rows = not tested)  LOWER EXTREMITY ROM:      Not tested due to pain sensitivity  LOWER EXTREMITY MMT:     Not tested due to pain sensitivity. At least 3/5 in hip and knee   LUMBAR SPECIAL TESTS:  Not tolerated  FUNCTIONAL TESTS:  5 times sit to stand: TBA Timed up and go (TUG): 25.85 3 stage balance: NBOS needs assist gaining position. 3 MWT:149ft 1 standing rest period, no ad  GAIT: Distance walked: 123 Assistive device utilized: None pt encouraged to use rollator when coming to sessions for toleration to exercise as well as safety.  She VU Level of assistance: SBA Comments: Increased time spent in stance bilaterally, lateral displacement  TODAY'S TREATMENT:                                                                                                                              Pt seen for aquatic therapy today.  Treatment took place in water 3.5-4.75 ft in depth at the [facility] pool. Temp of water was 91.  Pt entered/exited the pool via stairs  with hand rail.  *Intro to setting *walking ue support barbell *HB carry rainbow: forward, back and side stepping x 2 widths ea *L stretch *Hamstring and gastroc stretch bottom step *TrA sets: 1/2 noodle ->full hollow noodle wide stance 2 x 5 then staggered stance x5 *decompression position instructed.  Pt able to easily gain COB in  vertical position.   - cycling; hip add/abd, sciatic nerve flossing; hip flex ext.    Pt requires the buoyancy and hydrostatic pressure of water for support, and to offload joints by unweighting joint load by at least 50 % in navel deep water and by at least 75-80% in chest to neck deep water.  Viscosity of the water is needed for resistance of strengthening. Water current perturbations provides challenge to standing balance requiring increased core activation.     PATIENT EDUCATION:  Education details: Discussed eval findings, rehab rationale, aquatic program progression/POC and pools in area. Patient is in agreement  Person educated: Patient Education method: Explanation Education comprehension: verbalized understanding  HOME EXERCISE PROGRAM: tba  ASSESSMENT:  CLINICAL IMPRESSION: Pt demonstrates safety and indep in setting with therapist instructing from deck.  She is confident and comfortable, easily able to find COB with vertical suspension for decompression pain relief.  Pt directed  as well as educated on sciatic flossing and other positions/movements for pain relief.  She does reports minor sciatic nerve pain with Tra engagement in wide stance that is reduced with flossing.  She VU of le/LB planes of movement, importance of movement in pain relief and improved function. Pain reduces in LB by 2NPRS at end of treatment. She is a good candidate for  aquatic therapy intervention and will benefit from the properties of water to progress towards land based goals   Initial Impression Patient is a 67 y.o. f who was seen today for physical therapy evaluation and treatment for LBP. She has a long hx of chronic back pain dating back into the 1970's.  She reports multiple episodes of PT throughout the years which have helped to ease her pain.  She is new in town in past year and just getting settled in new home.  Pain exacerbation due some to unpacking.  She does have an aide 11 hours a week to  assist with ADL's and household chores.  She reports limitations where any static standing or bending/twisting is involved.  Relies on aide to help her with bathing.  She has had aquatic therapy in past.  Does not yet have access to a pool.  She is given list of area pools and is encouraged to check them out.  Her pain sensitivity is high today and she does not tolerate much objective or functional testing.  Will plan to test as pt can better tolerate. She is significantly limited in all areas and will benefit from skilled PT intervention to improve deficits, decrease and manage pain, as well as improve safety and QOL.  OBJECTIVE IMPAIRMENTS: Abnormal gait, decreased activity tolerance, decreased balance, decreased endurance, decreased knowledge of condition, decreased knowledge of use of DME, decreased mobility, difficulty walking, decreased ROM, decreased strength, impaired perceived functional ability, impaired flexibility, postural dysfunction, obesity, and pain.   ACTIVITY LIMITATIONS: carrying, lifting, bending, sitting, standing, squatting, sleeping, stairs, transfers, bed mobility, dressing, hygiene/grooming, and locomotion level  PARTICIPATION LIMITATIONS: meal prep, cleaning, laundry, shopping, community activity, and yard work  PERSONAL FACTORS: Past/current experiences, Time since onset of injury/illness/exacerbation, and 1-2 comorbidities: see problem list  are also affecting patient's functional outcome.   REHAB POTENTIAL: Good  CLINICAL DECISION MAKING: Evolving/moderate complexity  EVALUATION COMPLEXITY: Moderate   GOALS: Goals reviewed with patient? Yes  SHORT TERM GOALS: Target date: 06/29/23  Pt will tolerate full aquatic sessions consistently without increase in pain and with improving function to demonstrate good toleration and effectiveness of intervention.  Baseline: Goal status: INITIAL  2.  Pt will amb to and from setting consistently using ad along with completing  entire aquatics session without excessive fatigue or pain Baseline:  Goal status: INITIAL  3.  Pt will complete tandem and SLS x >15s in 3.6 ft with ue support as needed Baseline:  Goal status: INITIAL  4.  Pt to be indep with initial aquatic HEP and gain access to personal pool for completion Baseline: pt given area pool handout Goal status: INITIAL   LONG TERM GOALS: Target date: 08/10/23  Pt to improve Foto core by 10% to demonstrate improved perceived functional ability Baseline: 24% Goal status: INITIAL  2.  Pt will improve LE strength to at least 4/5 to demonstrate improved toleration to testing and increase safety with function Baseline:  Goal status: INITIAL  3.  Pt will stand NBOS and semi tandem x 20s to demonstrate improve balance Baseline:  Goal status: INITIAL  4.  Pt will report decrease in LBP with amb by 3-4 NPRS to demonstrated improved ability to tolerate ADL's. Baseline:  Goal status: INITIAL  5.  Pt will improve on 3 MWT to 234ft demonstrating improved toleration to amb in home environment Baseline:  Goal status: INITIAL  6. Pt will improve on Tug test to <or= 18s to demonstrate improvement in lower extremity function, mobility and decreased fall risk. Baseline: 25.85 Goal status: INITIAL  PLAN:  PT FREQUENCY: 2x/week  PT DURATION: 10 weeks  PLANNED INTERVENTIONS: 97164- PT Re-evaluation, 97110-Therapeutic exercises, 97530- Therapeutic activity, 97112- Neuromuscular re-education, 97535- Self Care, 40981- Manual therapy, [phone]- Gait training, [phone]- Orthotic Fit/training, [phone]- Aquatic Therapy, 97014- Electrical stimulation (unattended), [phone]- Ionotophoresis 4mg /ml Dexamethasone, Patient/Family education, Balance training, Stair training, Taping, Dry Needling, Joint mobilization, DME instructions, Cryotherapy, and Moist heat.  PLAN FOR NEXT SESSION: Aquatics only: pain management/relief, general strengthening, stretching, gait and balance retraining,  assist with gaining access to pool   [place] Tomma Lightning) Jakorian Marengo MPT 06/11/23 12:08 PM [facility] [address] [place], [place], [address] Phone: [phone]   Fax:  [phone]

## 2023-06-15 ENCOUNTER — Encounter (HOSPITAL_BASED_OUTPATIENT_CLINIC_OR_DEPARTMENT_OTHER): Payer: Self-pay | Admitting: Physical Therapy

## 2023-06-15 ENCOUNTER — Ambulatory Visit (HOSPITAL_BASED_OUTPATIENT_CLINIC_OR_DEPARTMENT_OTHER): Payer: Medicare Other | Admitting: Physical Therapy

## 2023-06-15 DIAGNOSIS — M6281 Muscle weakness (generalized): Secondary | ICD-10-CM

## 2023-06-15 DIAGNOSIS — M5459 Other low back pain: Secondary | ICD-10-CM

## 2023-06-15 DIAGNOSIS — R293 Abnormal posture: Secondary | ICD-10-CM

## 2023-06-15 NOTE — Therapy (Signed)
OUTPATIENT PHYSICAL THERAPY THORACOLUMBAR EVALUATION   Patient Name: Brittany [name] MRN: [id number] DOB:December 05, 1955, 67 y.o., female Today's Date: 06/15/2023  END OF SESSION:  PT End of Session - 06/15/23 0844     Visit Number 3    Number of Visits 16    Date for PT Re-Evaluation 08/10/23    Authorization Type mcr    Progress Note Due on Visit 10    PT Start Time 0822    PT Stop Time 0900    PT Time Calculation (min) 38 min    Activity Tolerance Patient limited by pain;Patient tolerated treatment well    Behavior During Therapy [facility] for tasks assessed/performed             Past Medical History:  Diagnosis Date   ACNE NEC    ALLERGIC RHINITIS    ALOPECIA    Anal fissure    Anxiety    Panic Attack.  None in a long time   Arthritis    Asymptomatic microscopic hematuria 12/05/2022   Bowel obstruction (HCC)    Breast mass 12/05/2022   Sep 04, 2008 Entered By: Bennie Pierini B Comment: benign on bx   Chronic headaches    migraine- none in years   Constipation    drinks tea with senna   DEPRESSION    Depression    DISORDER, BIPOLAR NEC    Family history of breast cancer    Family history of pancreatic cancer    GERD (gastroesophageal reflux disease)    HEMORRHOIDS-INTERNAL    History of stress test    ETT 10/17: Ex 7'; no chest pain, no ST changes, [facility] Treadmill Score 7   HYPERLIPIDEMIA    OBSTRUCTIVE SLEEP APNEA    Seasonal allergies    Past Surgical History:  Procedure Laterality Date   BREAST LUMPECTOMY Right last done 2005   x 2   CHOLECYSTECTOMY N/A 03/07/2014   Procedure: LAPAROSCOPIC CHOLECYSTECTOMY WITH INTRAOPERATIVE CHOLANGIOGRAM;  Surgeon: Claud Kelp, MD;  Location: [facility] OR;  Service: General;  Laterality: N/A;   EUS N/A 01/27/2014   Procedure: ESOPHAGEAL ENDOSCOPIC ULTRASOUND (EUS) RADIAL;  Surgeon: Willis Modena, MD;  Location: [facility] ENDOSCOPY;  Service: Endoscopy;  Laterality: N/A;   EYE SURGERY  08/2021   Cataract surgey   HAND SURGERY  Bilateral    for arthritis  left 02/02/14- right approx 2014   KNEE SURGERY Bilateral    right x 2 left x 1.  arthroscopy   RADICAL HYSTERECTOMY     thumb and pinkie finger surgery Right 06/19/2012   TONSILLECTOMY     Patient Active Problem List   Diagnosis Date Noted   Chronic pain syndrome 12/05/2022   Urinary incontinence 12/05/2022   Plantar fasciitis 12/05/2022   Insomnia 12/05/2022   Dry skin 12/05/2022   Dizziness and giddiness 12/05/2022   Cataract 12/05/2022   Anxiety 12/05/2022   Chloasma 12/05/2022   Cyst of kidney, acquired 12/05/2022   Cyst of thyroid 12/05/2022   Chronic sinusitis, unspecified 09/06/2022   Spinal stenosis of lumbar region 01/10/2022   Ex-cigarette smoker 08/23/2021   Tobacco dependence in remission 06/09/2020   Spondylolysis 05/27/2018   Unspecified inflammatory spondylopathy, lumbar region (HCC) 05/27/2018   Lumbar spondylosis 04/15/2018   Other hyperlipidemia 06/21/2017   Acquired absence of other genital organ(s) 06/05/2017   Headache 01/26/2017   Neck pain 11/07/2016   Radon exposure 04/03/2016   Allergic rhinitis 02/15/2016   Genetic testing 09/09/2015   Primary osteoarthritis of right knee 09/09/2015   Family  history of breast cancer    Family history of pancreatic cancer    Chronic bilateral low back pain without sciatica 04/20/2015   Trochanteric bursitis of left hip 04/20/2015   Cervical myofascial pain syndrome 04/20/2015   Medicare annual wellness visit, subsequent 02/10/2015   Gallstones 02/16/2014   Hyperparathyroidism (HCC) 01/29/2013   Chronic back pain 10/21/2012   ALOPECIA 03/05/2009   Obstructive sleep apnea 10/06/2008   HEMORRHOIDS-INTERNAL 04/28/2008   History of colonic polyps 04/24/2008   Hyperlipidemia LDL goal <100 03/20/2007   DISORDER, BIPOLAR NEC 03/20/2007   Depression 03/20/2007    PCP: Hetty Blend NP  REFERRING PROVIDER: Venita Lick MD  REFERRING DIAG: M54.51 (ICD-10-CM) - Vertebrogenic low back  pain   Rationale for Evaluation and Treatment: Rehabilitation  THERAPY DIAG:  Other low back pain  Muscle weakness (generalized)  Abnormal posture  ONSET DATE: >40 yrs  SUBJECTIVE:                                                                                                                                                                                           SUBJECTIVE STATEMENT: Pain is down a [place] but I took all my pain meds last night due to elevated pain.  Had a deep tissue massage and had 6 hours of relief from pain.  Get massages 1 x week for the next 3 months. Felt good after 1st aquatic session decreased pain and I took a nap.  Initial subjective I am trying therapy to avoid back surgery.  [facility].  Had massages before in last home state and it helped for a [place] while. Has aide 11 hours a week. She helps me bath. Bending putting dishes in dishwasher, getting laundry out of dryer. New In town in past year.   PERTINENT HISTORY:  KNEE oa receiving gel shots Chronic pain syndrome Bi-polar  PAIN:  Are you having pain? Yes: NPRS scale: 7/10; worst 8/10 least 09/26/08 Pain location: LB with radiation into left foot sometimes right Pain description: piercing down left leg > right Aggravating factors: bending, twisting, sitting upright, cold, standing x 5 mins; walking 5-10 min Relieving factors: meds  PRECAUTIONS: None  RED FLAGS: None   WEIGHT BEARING RESTRICTIONS: No  FALLS:  Has patient fallen in last 6 months? No  LIVING ENVIRONMENT: Lives with: lives alone Lives in: House/apartment Stairs: No Has following equipment at home: Single point cane, Environmental consultant - 4 wheeled, Marine scientist  OCCUPATION: disabled  PLOF: Needs assistance with ADLs and Needs assistance with homemaking  PATIENT GOALS: decrease pain; walk better  NEXT MD VISIT: 4-5  weeks  OBJECTIVE:  Note: Objective measures were completed at  Evaluation unless otherwise noted.  DIAGNOSTIC FINDINGS:  MRI of the lumbar spine as well as x-rays lumbar spine region. She does have a grade 1 spondylolisthesis on plain films on the lateral view. Significant spondylitic disease at L4 5 greater than L5 1   PATIENT SURVEYS:  FOTO Primary measure 24%; risk adjusted 42%; goal 40%  COGNITION: Overall cognitive status: Within functional limits for tasks assessed     SENSATION: WFL  MUSCLE LENGTH: Hamstrings: bilaterally tight visualized in sitting   POSTURE: rounded shoulders, forward head, and decreased lumbar lordosis  PALPATION: TTP  LUMBAR ROM:  High pain sensitivity limiting lumbar ROM AROM eval  Flexion 100% limited  Extension 100% limited  Right lateral flexion 100% limited  Left lateral flexion 90% limited  Right rotation   Left rotation    (Blank rows = not tested)  LOWER EXTREMITY ROM:      Not tested due to pain sensitivity  LOWER EXTREMITY MMT:     Not tested due to pain sensitivity. At least 3/5 in hip and knee   LUMBAR SPECIAL TESTS:  Not tolerated  FUNCTIONAL TESTS:  5 times sit to stand: TBA Timed up and go (TUG): 25.85 3 stage balance: NBOS needs assist gaining position. 3 MWT:187ft 1 standing rest period, no ad  GAIT: Distance walked: 123 Assistive device utilized: None pt encouraged to use rollator when coming to sessions for toleration to exercise as well as safety.  She VU Level of assistance: SBA Comments: Increased time spent in stance bilaterally, lateral displacement  TODAY'S TREATMENT:                                                                                                                              Pt seen for aquatic therapy today.  Treatment took place in water 3.5-4.75 ft in depth at the [facility] pool. Temp of water was 91.  Pt entered/exited the pool via stairs  with hand rail.   *walking without ue support forward and back *L stretch *decompression with  noodle wrapped anteriorly across chest   - cycling; hip add/abd, sciatic nerve flossing; hip flex ext. *HB carry rainbow bilateral then unilateral: forward, back and side stepping x 2 widths ea *TrA sets: full hollow noodle wide stance x10 then staggered stance x10   Pt requires the buoyancy and hydrostatic pressure of water for support, and to offload joints by unweighting joint load by at least 50 % in navel deep water and by at least 75-80% in chest to neck deep water.  Viscosity of the water is needed for resistance of strengthening. Water current perturbations provides challenge to standing balance requiring increased core activation.     PATIENT EDUCATION:  Education details: Discussed eval findings, rehab rationale, aquatic program progression/POC and pools in area. Patient is in agreement  Person educated: Patient Education method: Explanation Education comprehension: verbalized understanding  HOME EXERCISE  PROGRAM: tba  ASSESSMENT:  CLINICAL IMPRESSION: Pt arrives early for session and is able to be accommodated due to a cancellation.  She tolerates progression of last session with rest periods as needed.  Her pain sensitivity does increase slightly today with intervention although she is scheduled for a massage later today and wanted to move as much as able in today session for maximum benefit knowing massage alleviates pain. Goals ongoing     Initial Impression Patient is a 67 y.o. f who was seen today for physical therapy evaluation and treatment for LBP. She has a long hx of chronic back pain dating back into the 1970's.  She reports multiple episodes of PT throughout the years which have helped to ease her pain.  She is new in town in past year and just getting settled in new home.  Pain exacerbation due some to unpacking.  She does have an aide 11 hours a week to assist with ADL's and household chores.  She reports limitations where any static standing or bending/twisting  is involved.  Relies on aide to help her with bathing.  She has had aquatic therapy in past.  Does not yet have access to a pool.  She is given list of area pools and is encouraged to check them out.  Her pain sensitivity is high today and she does not tolerate much objective or functional testing.  Will plan to test as pt can better tolerate. She is significantly limited in all areas and will benefit from skilled PT intervention to improve deficits, decrease and manage pain, as well as improve safety and QOL.  OBJECTIVE IMPAIRMENTS: Abnormal gait, decreased activity tolerance, decreased balance, decreased endurance, decreased knowledge of condition, decreased knowledge of use of DME, decreased mobility, difficulty walking, decreased ROM, decreased strength, impaired perceived functional ability, impaired flexibility, postural dysfunction, obesity, and pain.   ACTIVITY LIMITATIONS: carrying, lifting, bending, sitting, standing, squatting, sleeping, stairs, transfers, bed mobility, dressing, hygiene/grooming, and locomotion level  PARTICIPATION LIMITATIONS: meal prep, cleaning, laundry, shopping, community activity, and yard work  PERSONAL FACTORS: Past/current experiences, Time since onset of injury/illness/exacerbation, and 1-2 comorbidities: see problem list  are also affecting patient's functional outcome.   REHAB POTENTIAL: Good  CLINICAL DECISION MAKING: Evolving/moderate complexity  EVALUATION COMPLEXITY: Moderate   GOALS: Goals reviewed with patient? Yes  SHORT TERM GOALS: Target date: 06/29/23  Pt will tolerate full aquatic sessions consistently without increase in pain and with improving function to demonstrate good toleration and effectiveness of intervention.  Baseline: Goal status: INITIAL  2.  Pt will amb to and from setting consistently using ad along with completing entire aquatics session without excessive fatigue or pain Baseline:  Goal status: INITIAL  3.  Pt will  complete tandem and SLS x >15s in 3.6 ft with ue support as needed Baseline:  Goal status: INITIAL  4.  Pt to be indep with initial aquatic HEP and gain access to personal pool for completion Baseline: pt given area pool handout Goal status: INITIAL   LONG TERM GOALS: Target date: 08/10/23  Pt to improve Foto core by 10% to demonstrate improved perceived functional ability Baseline: 24% Goal status: INITIAL  2.  Pt will improve LE strength to at least 4/5 to demonstrate improved toleration to testing and increase safety with function Baseline:  Goal status: INITIAL  3.  Pt will stand NBOS and semi tandem x 20s to demonstrate improve balance Baseline:  Goal status: INITIAL  4.  Pt will report decrease in LBP  with amb by 3-4 NPRS to demonstrated improved ability to tolerate ADL's. Baseline:  Goal status: INITIAL  5.  Pt will improve on 3 MWT to 259ft demonstrating improved toleration to amb in home environment Baseline:  Goal status: INITIAL  6. Pt will improve on Tug test to <or= 18s to demonstrate improvement in lower extremity function, mobility and decreased fall risk. Baseline: 25.85 Goal status: INITIAL  PLAN:  PT FREQUENCY: 2x/week  PT DURATION: 10 weeks  PLANNED INTERVENTIONS: 97164- PT Re-evaluation, 97110-Therapeutic exercises, 97530- Therapeutic activity, 97112- Neuromuscular re-education, 97535- Self Care, 60454- Manual therapy, [phone]- Gait training, [phone]- Orthotic Fit/training, [phone]- Aquatic Therapy, 97014- Electrical stimulation (unattended), [phone]- Ionotophoresis 4mg /ml Dexamethasone, Patient/Family education, Balance training, Stair training, Taping, Dry Needling, Joint mobilization, DME instructions, Cryotherapy, and Moist heat.  PLAN FOR NEXT SESSION: Aquatics only: pain management/relief, general strengthening, stretching, gait and balance retraining, assist with gaining access to pool   [place] Tomma Lightning) Tanara Turvey MPT 06/15/23 10:54 AM [facility] [address] [place], [place], [address] Phone: [phone]   Fax:  [phone]

## 2023-06-18 ENCOUNTER — Ambulatory Visit (HOSPITAL_BASED_OUTPATIENT_CLINIC_OR_DEPARTMENT_OTHER): Payer: Medicare Other | Admitting: Physical Therapy

## 2023-06-18 DIAGNOSIS — R293 Abnormal posture: Secondary | ICD-10-CM

## 2023-06-18 DIAGNOSIS — M5459 Other low back pain: Secondary | ICD-10-CM

## 2023-06-18 DIAGNOSIS — M6281 Muscle weakness (generalized): Secondary | ICD-10-CM

## 2023-06-18 DIAGNOSIS — R2689 Other abnormalities of gait and mobility: Secondary | ICD-10-CM

## 2023-06-18 NOTE — Therapy (Signed)
OUTPATIENT PHYSICAL THERAPY THORACOLUMBAR TREATMENT   Patient Name: Brittany [name] MRN: [id number] DOB:05-Jun-1956, 67 y.o., female Today's Date: 06/18/2023  END OF SESSION:  PT End of Session - 06/18/23 0909     Visit Number 4    Number of Visits 16    Date for PT Re-Evaluation 08/10/23    Authorization Type MCR    Progress Note Due on Visit 10    PT Start Time 0900    PT Stop Time 0940    PT Time Calculation (min) 40 min    Behavior During Therapy [facility] for tasks assessed/performed             Past Medical History:  Diagnosis Date   ACNE NEC    ALLERGIC RHINITIS    ALOPECIA    Anal fissure    Anxiety    Panic Attack.  None in a long time   Arthritis    Asymptomatic microscopic hematuria 12/05/2022   Bowel obstruction (HCC)    Breast mass 12/05/2022   Sep 04, 2008 Entered By: Bennie Pierini B Comment: benign on bx   Chronic headaches    migraine- none in years   Constipation    drinks tea with senna   DEPRESSION    Depression    DISORDER, BIPOLAR NEC    Family history of breast cancer    Family history of pancreatic cancer    GERD (gastroesophageal reflux disease)    HEMORRHOIDS-INTERNAL    History of stress test    ETT 10/17: Ex 7'; no chest pain, no ST changes, Duke Treadmill Score 7   HYPERLIPIDEMIA    OBSTRUCTIVE SLEEP APNEA    Seasonal allergies    Past Surgical History:  Procedure Laterality Date   BREAST LUMPECTOMY Right last done 2005   x 2   CHOLECYSTECTOMY N/A 03/07/2014   Procedure: LAPAROSCOPIC CHOLECYSTECTOMY WITH INTRAOPERATIVE CHOLANGIOGRAM;  Surgeon: Claud Kelp, MD;  Location: [facility] OR;  Service: General;  Laterality: N/A;   EUS N/A 01/27/2014   Procedure: ESOPHAGEAL ENDOSCOPIC ULTRASOUND (EUS) RADIAL;  Surgeon: Willis Modena, MD;  Location: [facility] ENDOSCOPY;  Service: Endoscopy;  Laterality: N/A;   EYE SURGERY  08/2021   Cataract surgey   HAND SURGERY Bilateral    for arthritis  left 02/02/14- right approx 2014   KNEE SURGERY Bilateral     right x 2 left x 1.  arthroscopy   RADICAL HYSTERECTOMY     thumb and pinkie finger surgery Right 06/19/2012   TONSILLECTOMY     Patient Active Problem List   Diagnosis Date Noted   Chronic pain syndrome 12/05/2022   Urinary incontinence 12/05/2022   Plantar fasciitis 12/05/2022   Insomnia 12/05/2022   Dry skin 12/05/2022   Dizziness and giddiness 12/05/2022   Cataract 12/05/2022   Anxiety 12/05/2022   Chloasma 12/05/2022   Cyst of kidney, acquired 12/05/2022   Cyst of thyroid 12/05/2022   Chronic sinusitis, unspecified 09/06/2022   Spinal stenosis of lumbar region 01/10/2022   Ex-cigarette smoker 08/23/2021   Tobacco dependence in remission 06/09/2020   Spondylolysis 05/27/2018   Unspecified inflammatory spondylopathy, lumbar region (HCC) 05/27/2018   Lumbar spondylosis 04/15/2018   Other hyperlipidemia 06/21/2017   Acquired absence of other genital organ(s) 06/05/2017   Headache 01/26/2017   Neck pain 11/07/2016   Radon exposure 04/03/2016   Allergic rhinitis 02/15/2016   Genetic testing 09/09/2015   Primary osteoarthritis of right knee 09/09/2015   Family history of breast cancer    Family history of pancreatic cancer  Chronic bilateral low back pain without sciatica 04/20/2015   Trochanteric bursitis of left hip 04/20/2015   Cervical myofascial pain syndrome 04/20/2015   Medicare annual wellness visit, subsequent 02/10/2015   Gallstones 02/16/2014   Hyperparathyroidism (HCC) 01/29/2013   Chronic back pain 10/21/2012   ALOPECIA 03/05/2009   Obstructive sleep apnea 10/06/2008   HEMORRHOIDS-INTERNAL 04/28/2008   History of colonic polyps 04/24/2008   Hyperlipidemia LDL goal <100 03/20/2007   DISORDER, BIPOLAR NEC 03/20/2007   Depression 03/20/2007    PCP: Hetty Blend NP  REFERRING PROVIDER: Venita Lick MD  REFERRING DIAG: M54.51 (ICD-10-CM) - Vertebrogenic low back pain   Rationale for Evaluation and Treatment: Rehabilitation  THERAPY DIAG:  No  diagnosis found.  ONSET DATE: >40 yrs  SUBJECTIVE:                                                                                                                                                                                           SUBJECTIVE STATEMENT: "I think the pool is helping".  Pt reports she has been doing sciatic nerve flossing at home; helpful.   POOL ACCESS:  Pt interested in joining [place], but has not yet.   Initial subjective I am trying therapy to avoid back surgery.  [facility]  Had massages before in last home state and it helped for a [place] while. Has aide 11 hours a week. She helps me bath. Bending putting dishes in dishwasher, getting laundry out of dryer. New In town in past year.   PERTINENT HISTORY:  KNEE oa receiving gel shots Chronic pain syndrome Bi-polar  PAIN:  Are you having pain? Yes: NPRS scale: 7/10; Pain location: LB with radiation into left foot sometimes right Pain description: piercing down left leg > right Aggravating factors: bending, twisting, sitting upright, cold, standing x 5 mins; walking 5-10 min Relieving factors: meds  PRECAUTIONS: None  RED FLAGS: None   WEIGHT BEARING RESTRICTIONS: No  FALLS:  Has patient fallen in last 6 months? No  LIVING ENVIRONMENT: Lives with: lives alone Lives in: House/apartment Stairs: No Has following equipment at home: Single point cane, Environmental consultant - 4 wheeled, Marine scientist  OCCUPATION: disabled  PLOF: Needs assistance with ADLs and Needs assistance with homemaking  PATIENT GOALS: decrease pain; walk better  NEXT MD VISIT: 4-5 weeks  OBJECTIVE:  Note: Objective measures were completed at Evaluation unless otherwise noted.  DIAGNOSTIC FINDINGS:  MRI of the lumbar spine as well as x-rays lumbar spine region. She does have a grade 1 spondylolisthesis on plain films on the lateral view. Significant spondylitic disease at L4  5 greater than  L5 1   PATIENT SURVEYS:  FOTO Primary measure 24%; risk adjusted 42%; goal 40%  COGNITION: Overall cognitive status: Within functional limits for tasks assessed     SENSATION: WFL  MUSCLE LENGTH: Hamstrings: bilaterally tight visualized in sitting   POSTURE: rounded shoulders, forward head, and decreased lumbar lordosis  PALPATION: TTP  LUMBAR ROM:  High pain sensitivity limiting lumbar ROM AROM eval  Flexion 100% limited  Extension 100% limited  Right lateral flexion 100% limited  Left lateral flexion 90% limited  Right rotation   Left rotation    (Blank rows = not tested)  LOWER EXTREMITY ROM:      Not tested due to pain sensitivity  LOWER EXTREMITY MMT:     Not tested due to pain sensitivity. At least 3/5 in hip and knee   LUMBAR SPECIAL TESTS:  Not tolerated  FUNCTIONAL TESTS:  5 times sit to stand: TBA Timed up and go (TUG): 25.85 3 stage balance: NBOS needs assist gaining position. 3 MWT:145ft 1 standing rest period, no ad  GAIT: Distance walked: 123 Assistive device utilized: None pt encouraged to use rollator when coming to sessions for toleration to exercise as well as safety.  She VU Level of assistance: SBA Comments: Increased time spent in stance bilaterally, lateral displacement  TODAY'S TREATMENT:                                                                                                                              Pt seen for aquatic therapy today.  Treatment took place in water 3.5-4.75 ft in depth at the [facility] pool. Temp of water was 91.  Pt entered/exited the pool via stairs  with hand rail.   *unsupported, walking forward/ backward - multiple laps  * side stepping x 1 lap; with addct/ abdct with rainbow hand floats x 2 laps * holding wall: L stretch;  hip abdct/ addct x 10, alternating clams x 10 * farmer carry with bilat rainbow hand floats under water at side with marching forward/ backward and in place *TrA sets:  half hollow noodle wide stance x10  with stop 1/2 way to surface * TrA set with kick board row  x 12 * in 4 ft: SLS x 20 sec each;  tandem gait forward/ backward with UE On rainbow hand floats * L stretch  *decompression with noodle wrapped anteriorly across chest   - cycling  Pt requires the buoyancy and hydrostatic pressure of water for support, and to offload joints by unweighting joint load by at least 50 % in navel deep water and by at least 75-80% in chest to neck deep water.  Viscosity of the water is needed for resistance of strengthening. Water current perturbations provides challenge to standing balance requiring increased core activation.     PATIENT EDUCATION:  Education details: aquatic therapy intro Person educated: Patient Education method: Explanation Education comprehension: verbalized understanding  HOME EXERCISE PROGRAM:  tba  ASSESSMENT:  CLINICAL IMPRESSION: Pt reported gradual reduction of pain to 6/10 while exercising in water.  She reports most relief with suspended cycling and L stretch.  Goals ongoing     Initial Impression Patient is a 67 y.o. f who was seen today for physical therapy evaluation and treatment for LBP. She has a long hx of chronic back pain dating back into the 1970's.  She reports multiple episodes of PT throughout the years which have helped to ease her pain.  She is new in town in past year and just getting settled in new home.  Pain exacerbation due some to unpacking.  She does have an aide 11 hours a week to assist with ADL's and household chores.  She reports limitations where any static standing or bending/twisting is involved.  Relies on aide to help her with bathing.  She has had aquatic therapy in past.  Does not yet have access to a pool.  She is given list of area pools and is encouraged to check them out.  Her pain sensitivity is high today and she does not tolerate much objective or functional testing.  Will plan to test as pt can  better tolerate. She is significantly limited in all areas and will benefit from skilled PT intervention to improve deficits, decrease and manage pain, as well as improve safety and QOL.  OBJECTIVE IMPAIRMENTS: Abnormal gait, decreased activity tolerance, decreased balance, decreased endurance, decreased knowledge of condition, decreased knowledge of use of DME, decreased mobility, difficulty walking, decreased ROM, decreased strength, impaired perceived functional ability, impaired flexibility, postural dysfunction, obesity, and pain.   ACTIVITY LIMITATIONS: carrying, lifting, bending, sitting, standing, squatting, sleeping, stairs, transfers, bed mobility, dressing, hygiene/grooming, and locomotion level  PARTICIPATION LIMITATIONS: meal prep, cleaning, laundry, shopping, community activity, and yard work  PERSONAL FACTORS: Past/current experiences, Time since onset of injury/illness/exacerbation, and 1-2 comorbidities: see problem list  are also affecting patient's functional outcome.   REHAB POTENTIAL: Good  CLINICAL DECISION MAKING: Evolving/moderate complexity  EVALUATION COMPLEXITY: Moderate   GOALS: Goals reviewed with patient? Yes  SHORT TERM GOALS: Target date: 06/29/23  Pt will tolerate full aquatic sessions consistently without increase in pain and with improving function to demonstrate good toleration and effectiveness of intervention.  Baseline: Goal status: INITIAL  2.  Pt will amb to and from setting consistently using ad along with completing entire aquatics session without excessive fatigue or pain Baseline:  Goal status: INITIAL  3.  Pt will complete tandem and SLS x >15s in 3.6 ft with ue support as needed Baseline:  Goal status: INITIAL  4.  Pt to be indep with initial aquatic HEP and gain access to personal pool for completion Baseline: pt given area pool handout Goal status: INITIAL   LONG TERM GOALS: Target date: 08/10/23  Pt to improve Foto core by 10%  to demonstrate improved perceived functional ability Baseline: 24% Goal status: INITIAL  2.  Pt will improve LE strength to at least 4/5 to demonstrate improved toleration to testing and increase safety with function Baseline:  Goal status: INITIAL  3.  Pt will stand NBOS and semi tandem x 20s to demonstrate improve balance Baseline:  Goal status: INITIAL  4.  Pt will report decrease in LBP with amb by 3-4 NPRS to demonstrated improved ability to tolerate ADL's. Baseline:  Goal status: INITIAL  5.  Pt will improve on 3 MWT to 237ft demonstrating improved toleration to amb in home environment Baseline:  Goal status: INITIAL  6. Pt will improve on Tug test to <or= 18s to demonstrate improvement in lower extremity function, mobility and decreased fall risk. Baseline: 25.85 Goal status: INITIAL  PLAN:  PT FREQUENCY: 2x/week  PT DURATION: 10 weeks  PLANNED INTERVENTIONS: 97164- PT Re-evaluation, 97110-Therapeutic exercises, 97530- Therapeutic activity, 97112- Neuromuscular re-education, 97535- Self Care, 09811- Manual therapy, [phone]- Gait training, [phone]- Orthotic Fit/training, [phone]- Aquatic Therapy, 97014- Electrical stimulation (unattended), [phone]- Ionotophoresis 4mg /ml Dexamethasone, Patient/Family education, Balance training, Stair training, Taping, Dry Needling, Joint mobilization, DME instructions, Cryotherapy, and Moist heat.  PLAN FOR NEXT SESSION: Aquatics only: pain management/relief, general strengthening, stretching, gait and balance retraining, assist with gaining access to pool   Mayer Camel, PTA 06/18/23 9:55 AM [facility] [address] [place], [place], [address] Phone: [phone]   Fax:  [phone]

## 2023-06-20 NOTE — Progress Notes (Signed)
 Cardiology Office Note:    Date:  06/28/2023   ID:  Brittany [name], DOB 03-Aug-1955, MRN [id number]  PCP:  Lendia Boby CROME, NP-C   [facility] Providers Cardiologist:  None     Referring MD: Judyth Isaiah Bottcher, *   Chief Complaint  Patient presents with   Chest Pain    History of Present Illness:    Brittany [name] is a 68 y.o. female seen at the request of Dr Judyth for evaluation of chest pain. She has a history of HLD and OSA on CPAP. Was evaluated by cardiology in the past. Normal stress Echo in 2012. Normal Echo in 2015 and low risk Myoview study at that time. She was last seen in 2017 and ETT was normal at that time.   She states for the past 3 years she will get shooting pains in her right arm and get a severe pressure sensation in her chest like someone is standing on it. Not related to anxiety of activity. No clear triggers. Lasts a few minutes. No alleviating factors. Happens 3-4 times a month. Is concerned she has a heart problem. Also notes a cough when walking in cold weather.   Past Medical History:  Diagnosis Date   ACNE NEC    ALLERGIC RHINITIS    ALOPECIA    Anal fissure    Anxiety    Panic Attack.  None in a long time   Arthritis    Asymptomatic microscopic hematuria 12/05/2022   Bowel obstruction (HCC)    Breast mass 12/05/2022   Sep 04, 2008 Entered By: MEMORY GULL B Comment: benign on bx   Chronic headaches    migraine- none in years   Constipation    drinks tea with senna   DEPRESSION    Depression    DISORDER, BIPOLAR NEC    Family history of breast cancer    Family history of pancreatic cancer    GERD (gastroesophageal reflux disease)    HEMORRHOIDS-INTERNAL    History of stress test    ETT 10/17: Ex 7'; no chest pain, no ST changes, Duke Treadmill Score 7   HYPERLIPIDEMIA    OBSTRUCTIVE SLEEP APNEA    Seasonal allergies     Past Surgical History:  Procedure Laterality Date   BREAST LUMPECTOMY Right last done 2005   x 2    CHOLECYSTECTOMY N/A 03/07/2014   Procedure: LAPAROSCOPIC CHOLECYSTECTOMY WITH INTRAOPERATIVE CHOLANGIOGRAM;  Surgeon: Elon Pacini, MD;  Location: [facility] OR;  Service: General;  Laterality: N/A;   EUS N/A 01/27/2014   Procedure: ESOPHAGEAL ENDOSCOPIC ULTRASOUND (EUS) RADIAL;  Surgeon: Elsie Cree, MD;  Location: [facility];  Service: Endoscopy;  Laterality: N/A;   EYE SURGERY  08/2021   Cataract surgey   HAND SURGERY Bilateral    for arthritis  left 02/02/14- right approx 2014   KNEE SURGERY Bilateral    right x 2 left x 1.  arthroscopy   RADICAL HYSTERECTOMY     thumb and pinkie finger surgery Right 06/19/2012   TONSILLECTOMY      Current Medications: Current Meds  Medication Sig   diclofenac  sodium (VOLTAREN ) 1 % GEL Apply 2 g topically 4 (four) times daily.   LORazepam  (ATIVAN ) 0.5 MG tablet Take 1 tablet (0.5 mg total) by mouth every 8 (eight) hours.   meloxicam  (MOBIC ) 15 MG tablet Take 1 tablet (15 mg total) by mouth daily.   Multiple Vitamin (MULTIVITAMIN WITH MINERALS) TABS Take 1 tablet by mouth daily.   saline (AYR)  GEL Place 1 Application into both nostrils every 6 (six) hours as needed.   tiZANidine (ZANAFLEX) 2 MG tablet    Vitamin Mixture (ESTER-C PO) Take 1,000 mg by mouth.     Allergies:   Flexeril  [cyclobenzaprine ], Hydrocodone , Motrin  [ibuprofen ], Nsaids, Prednisone, and Robaxin  [methocarbamol ]   Social History   Socioeconomic History   Marital status: Married    Spouse name: Not on file   Number of children: 4   Years of education: Not on file   Highest education level: Not on file  Occupational History   Occupation: disabled    Employer: UNEMPLOYED  Tobacco Use   Smoking status: Former    Current packs/day: 0.00    Average packs/day: 0.1 packs/day for 7.0 years (0.7 ttl pk-yrs)    Types: Cigarettes    Start date: 06/19/1974    Quit date: 06/19/1981    Years since quitting: 42.0   Smokeless tobacco: Never  Vaping Use   Vaping status: Never Used   Substance and Sexual Activity   Alcohol use: No    Comment: rare   Drug use: No   Sexual activity: Not on file  Other Topics Concern   Not on file  Social History Narrative   Not on file   Social Drivers of Health   Financial Resource Strain: Low Risk  (01/22/2023)   Overall Financial Resource Strain (CARDIA)    Difficulty of Paying Living Expenses: Not hard at all  Recent Concern: Financial Resource Strain - Medium Risk (12/11/2022)   Overall Financial Resource Strain (CARDIA)    Difficulty of Paying Living Expenses: Somewhat hard  Food Insecurity: No Food Insecurity (01/22/2023)   Hunger Vital Sign    Worried About Running Out of Food in the Last Year: Never true    Ran Out of Food in the Last Year: Never true  Transportation Needs: No Transportation Needs (01/22/2023)   PRAPARE - Administrator, Civil Service (Medical): No    Lack of Transportation (Non-Medical): No  Physical Activity: Inactive (01/22/2023)   Exercise Vital Sign    Days of Exercise per Week: 0 days    Minutes of Exercise per Session: 0 min  Stress: Stress Concern Present (01/22/2023)   [employer] of Occupational Health - Occupational Stress Questionnaire    Feeling of Stress : Very much  Social Connections: Moderately Integrated (01/22/2023)   Social Connection and Isolation Panel [NHANES]    Frequency of Communication with Friends and Family: More than three times a week    Frequency of Social Gatherings with Friends and Family: More than three times a week    Attends Religious Services: More than 4 times per year    Active Member of [employer] or Organizations: Yes    Attends Banker Meetings: 1 to 4 times per year    Marital Status: Widowed     Family History: The patient's family history includes Aneurysm in her paternal grandmother; Breast cancer in her maternal aunt, maternal aunt, paternal aunt, and paternal aunt; Breast cancer (age of onset: 25) in her cousin; Breast cancer (age  of onset: 40) in her maternal grandmother; Breast cancer (age of onset: 15) in her mother; Breast cancer (age of onset: 51) in her paternal aunt; COPD in her father; Cancer in her maternal uncle and maternal uncle; Colon cancer in her maternal uncle; Colon polyps in her maternal uncle; Esophageal cancer in her paternal uncle; Heart disease in her sister; Hypertension in her brother; Irritable bowel syndrome in  her cousin; Kidney disease in her maternal uncle; Lung cancer in her paternal uncle; Other in her paternal aunt; Pancreatic cancer in her paternal uncle; Stroke in her maternal grandfather. There is no history of Rectal cancer or Stomach cancer.  ROS:   Please see the history of present illness.     All other systems reviewed and are negative.  EKGs/Labs/Other Studies Reviewed:    The following studies were reviewed today: EKG Interpretation Date/Time:  Thursday June 28 2023 08:47:40 EST Ventricular Rate:  56 PR Interval:  172 QRS Duration:  84 QT Interval:  410 QTC Calculation: 395 R Axis:   -2  Text Interpretation: Sinus bradycardia Possible Left atrial enlargement When compared with ECG of 13-Jan-2014 15:07, improved R wave forces in precordial leads. Confirmed by Sarya Linenberger [phone]) on 06/28/2023 8:53:45 AM   EKG Interpretation Date/Time:  Thursday June 28 2023 08:47:40 EST Ventricular Rate:  56 PR Interval:  172 QRS Duration:  84 QT Interval:  410 QTC Calculation: 395 R Axis:   -2  Text Interpretation: Sinus bradycardia Possible Left atrial enlargement When compared with ECG of 13-Jan-2014 15:07, improved R wave forces in precordial leads. Confirmed by Madilyn Cephas [phone]) on 06/28/2023 8:53:45 AM    Recent Labs: 12/05/2022: ALT 19; BUN 9; Creatinine, Ser 0.62; Hemoglobin 13.0; Platelets 324.0; Potassium 4.2; Sodium 140  Recent Lipid Panel    Component Value Date/Time   CHOL 170 12/05/2022 0925   TRIG 94.0 12/05/2022 0925   HDL 56.90 12/05/2022 0925   CHOLHDL 3  12/05/2022 0925   VLDL 18.8 12/05/2022 0925   LDLCALC 95 12/05/2022 0925   LDLDIRECT 134.9 10/21/2012 0953     Risk Assessment/Calculations:                Physical Exam:    VS:  BP 102/68 (BP Location: Right Arm, Patient Position: Sitting, Cuff Size: Normal)   Pulse (!) 58   Ht 5' 2 (1.575 m)   Wt 183 lb (83 kg)   SpO2 94%   BMI 33.47 kg/m     Wt Readings from Last 3 Encounters:  06/28/23 183 lb (83 kg)  03/07/23 184 lb (83.5 kg)  01/22/23 182 lb 12.8 oz (82.9 kg)     GEN:  Well nourished, well developed in no acute distress HEENT: Normal NECK: No JVD; No carotid bruits LYMPHATICS: No lymphadenopathy CARDIAC: RRR, no murmurs, rubs, gallops RESPIRATORY:  Clear to auscultation without rales, wheezing or rhonchi  ABDOMEN: Soft, non-tender, non-distended MUSCULOSKELETAL:  No edema; No deformity  SKIN: Warm and dry NEUROLOGIC:  Alert and oriented x 3 PSYCHIATRIC:  Normal affect   ASSESSMENT:    1. Precordial pain   2. Obstructive sleep apnea   3. Other hyperlipidemia    PLAN:    In order of problems listed above:  Chest pain. Risk factor of mild HLD. Intermediate risk. Recommend ischemic evaluation with coronary CTA OSA on CPAP HLD mild. If she has CAD will need to start statin therapy           Medication Adjustments/Labs and Tests Ordered: Current medicines are reviewed at length with the patient today.  Concerns regarding medicines are outlined above.  Orders Placed This Encounter  Procedures   EKG 12-Lead   No orders of the defined types were placed in this encounter.   There are no Patient Instructions on file for this visit.   Signed, Candela Krul, MD  06/28/2023 9:08 AM    [facility]

## 2023-06-22 ENCOUNTER — Encounter (HOSPITAL_BASED_OUTPATIENT_CLINIC_OR_DEPARTMENT_OTHER): Payer: Self-pay | Admitting: Physical Therapy

## 2023-06-22 ENCOUNTER — Ambulatory Visit (HOSPITAL_BASED_OUTPATIENT_CLINIC_OR_DEPARTMENT_OTHER): Payer: Medicare Other | Attending: Geriatric Medicine | Admitting: Physical Therapy

## 2023-06-22 DIAGNOSIS — M6281 Muscle weakness (generalized): Secondary | ICD-10-CM | POA: Diagnosis present

## 2023-06-22 DIAGNOSIS — M5459 Other low back pain: Secondary | ICD-10-CM | POA: Diagnosis present

## 2023-06-22 DIAGNOSIS — R2689 Other abnormalities of gait and mobility: Secondary | ICD-10-CM | POA: Diagnosis present

## 2023-06-22 DIAGNOSIS — R293 Abnormal posture: Secondary | ICD-10-CM | POA: Insufficient documentation

## 2023-06-22 NOTE — Therapy (Signed)
 OUTPATIENT PHYSICAL THERAPY THORACOLUMBAR TREATMENT   Patient Name: Brittany [name] MRN: [id number] DOB:1956-05-22, 68 y.o., female Today's Date: 06/22/2023  END OF SESSION:  PT End of Session - 06/22/23 0908     Visit Number 5    Number of Visits 16    Date for PT Re-Evaluation 08/10/23    Authorization Type MCR    Progress Note Due on Visit 10    PT Start Time 0905    PT Stop Time 0945    PT Time Calculation (min) 40 min    Activity Tolerance Patient tolerated treatment well    Behavior During Therapy [facility] for tasks assessed/performed             Past Medical History:  Diagnosis Date   ACNE NEC    ALLERGIC RHINITIS    ALOPECIA    Anal fissure    Anxiety    Panic Attack.  None in a long time   Arthritis    Asymptomatic microscopic hematuria 12/05/2022   Bowel obstruction (HCC)    Breast mass 12/05/2022   Sep 04, 2008 Entered By: MEMORY GULL B Comment: benign on bx   Chronic headaches    migraine- none in years   Constipation    drinks tea with senna   DEPRESSION    Depression    DISORDER, BIPOLAR NEC    Family history of breast cancer    Family history of pancreatic cancer    GERD (gastroesophageal reflux disease)    HEMORRHOIDS-INTERNAL    History of stress test    ETT 10/17: Ex 7'; no chest pain, no ST changes, [facility] Treadmill Score 7   HYPERLIPIDEMIA    OBSTRUCTIVE SLEEP APNEA    Seasonal allergies    Past Surgical History:  Procedure Laterality Date   BREAST LUMPECTOMY Right last done 2005   x 2   CHOLECYSTECTOMY N/A 03/07/2014   Procedure: LAPAROSCOPIC CHOLECYSTECTOMY WITH INTRAOPERATIVE CHOLANGIOGRAM;  Surgeon: Elon Pacini, MD;  Location: [facility] OR;  Service: General;  Laterality: N/A;   EUS N/A 01/27/2014   Procedure: ESOPHAGEAL ENDOSCOPIC ULTRASOUND (EUS) RADIAL;  Surgeon: Elsie Cree, MD;  Location: [facility] ENDOSCOPY;  Service: Endoscopy;  Laterality: N/A;   EYE SURGERY  08/2021   Cataract surgey   HAND SURGERY Bilateral    for arthritis  left  02/02/14- right approx 2014   KNEE SURGERY Bilateral    right x 2 left x 1.  arthroscopy   RADICAL HYSTERECTOMY     thumb and pinkie finger surgery Right 06/19/2012   TONSILLECTOMY     Patient Active Problem List   Diagnosis Date Noted   Chronic pain syndrome 12/05/2022   Urinary incontinence 12/05/2022   Plantar fasciitis 12/05/2022   Insomnia 12/05/2022   Dry skin 12/05/2022   Dizziness and giddiness 12/05/2022   Cataract 12/05/2022   Anxiety 12/05/2022   Chloasma 12/05/2022   Cyst of kidney, acquired 12/05/2022   Cyst of thyroid  12/05/2022   Chronic sinusitis, unspecified 09/06/2022   Spinal stenosis of lumbar region 01/10/2022   Ex-cigarette smoker 08/23/2021   Tobacco dependence in remission 06/09/2020   Spondylolysis 05/27/2018   Unspecified inflammatory spondylopathy, lumbar region (HCC) 05/27/2018   Lumbar spondylosis 04/15/2018   Other hyperlipidemia 06/21/2017   Acquired absence of other genital organ(s) 06/05/2017   Headache 01/26/2017   Neck pain 11/07/2016   Radon exposure 04/03/2016   Allergic rhinitis 02/15/2016   Genetic testing 09/09/2015   Primary osteoarthritis of right knee 09/09/2015   Family history of breast  cancer    Family history of pancreatic cancer    Chronic bilateral low back pain without sciatica 04/20/2015   Trochanteric bursitis of left hip 04/20/2015   Cervical myofascial pain syndrome 04/20/2015   Medicare annual wellness visit, subsequent 02/10/2015   Gallstones 02/16/2014   Hyperparathyroidism (HCC) 01/29/2013   Chronic back pain 10/21/2012   ALOPECIA 03/05/2009   Obstructive sleep apnea 10/06/2008   HEMORRHOIDS-INTERNAL 04/28/2008   History of colonic polyps 04/24/2008   Hyperlipidemia LDL goal <100 03/20/2007   DISORDER, BIPOLAR NEC 03/20/2007   Depression 03/20/2007    PCP: Boby Mackintosh NP  REFERRING PROVIDER: Donaciano Sprang MD  REFERRING DIAG: M54.51 (ICD-10-CM) - Vertebrogenic low back pain   Rationale for Evaluation  and Treatment: Rehabilitation  THERAPY DIAG:  Other low back pain  Muscle weakness (generalized)  Abnormal posture  ONSET DATE: >40 yrs  SUBJECTIVE:                                                                                                                                                                                           SUBJECTIVE STATEMENT: I think the pool is helping.  Pt reports she has been doing sciatic nerve flossing at home; helpful.   POOL ACCESS:  Pt interested in joining [employer], but has not yet.   Initial subjective I am trying therapy to avoid back surgery.  [facility]  Had massages before in last home state and it helped for a [place] while. Has aide 11 hours a week. She helps me bath. Bending putting dishes in dishwasher, getting laundry out of dryer. New In town in past year.   PERTINENT HISTORY:  KNEE oa receiving gel shots Chronic pain syndrome Bi-polar  PAIN:  Are you having pain? Yes: NPRS scale: 7/10; Pain location: LB with radiation into left foot sometimes right Pain description: piercing down left leg > right Aggravating factors: bending, twisting, sitting upright, cold, standing x 5 mins; walking 5-10 min Relieving factors: meds  PRECAUTIONS: None  RED FLAGS: None   WEIGHT BEARING RESTRICTIONS: No  FALLS:  Has patient fallen in last 6 months? No  LIVING ENVIRONMENT: Lives with: lives alone Lives in: House/apartment Stairs: No Has following equipment at home: Single point cane, Environmental Consultant - 4 wheeled, Marine Scientist  OCCUPATION: disabled  PLOF: Needs assistance with ADLs and Needs assistance with homemaking  PATIENT GOALS: decrease pain; walk better  NEXT MD VISIT: 4-5 weeks  OBJECTIVE:  Note: Objective measures were completed at Evaluation unless otherwise noted.  DIAGNOSTIC FINDINGS:  MRI of the lumbar spine as well as x-rays lumbar spine  region. She does have a grade 1  spondylolisthesis on plain films on the lateral view. Significant spondylitic disease at L4 5 greater than L5 1   PATIENT SURVEYS:  FOTO Primary measure 24%; risk adjusted 42%; goal 40%  COGNITION: Overall cognitive status: Within functional limits for tasks assessed     SENSATION: WFL  MUSCLE LENGTH: Hamstrings: bilaterally tight visualized in sitting   POSTURE: rounded shoulders, forward head, and decreased lumbar lordosis  PALPATION: TTP  LUMBAR ROM:  High pain sensitivity limiting lumbar ROM AROM eval  Flexion 100% limited  Extension 100% limited  Right lateral flexion 100% limited  Left lateral flexion 90% limited  Right rotation   Left rotation    (Blank rows = not tested)  LOWER EXTREMITY ROM:      Not tested due to pain sensitivity  LOWER EXTREMITY MMT:     Not tested due to pain sensitivity. At least 3/5 in hip and knee   LUMBAR SPECIAL TESTS:  Not tolerated  FUNCTIONAL TESTS:  5 times sit to stand: TBA Timed up and go (TUG): 25.85 3 stage balance: NBOS needs assist gaining position. 3 MWT:157ft 1 standing rest period, no ad  GAIT: Distance walked: 123 Assistive device utilized: None pt encouraged to use rollator when coming to sessions for toleration to exercise as well as safety.  She VU Level of assistance: SBA Comments: Increased time spent in stance bilaterally, lateral displacement  TODAY'S TREATMENT:                                                                                                                              Pt seen for aquatic therapy today.  Treatment took place in water 3.5-4.75 ft in depth at the [facility] pool. Temp of water was 91.  Pt entered/exited the pool via stairs  with hand rail.   *unsupported, walking forward/ backward - multiple laps  * side stepping x 2 lap; with addct/ abdct with rainbow hand floats x 4 laps * holding wall: L stretch *decompression with noodle wrapped posteriorly (increases  sciatic pain) then anteriorly across chest   - cycling; hip abdct/ addct, clams; hip flex/ext *seated recovery period x 5 minutes.   *TrA sets: half hollow noodle-> full hollow noodle: wide stance x10  then staggered stance x 10 * L stretch  * farmer carry with bilat rainbow hand floats under water at side with marching forward/ backward and in place *L stretch    Pt requires the buoyancy and hydrostatic pressure of water for support, and to offload joints by unweighting joint load by at least 50 % in navel deep water and by at least 75-80% in chest to neck deep water.  Viscosity of the water is needed for resistance of strengthening. Water current perturbations provides challenge to standing balance requiring increased core activation.     PATIENT EDUCATION:  Education details: aquatic therapy intro Person educated: Patient Education method: Explanation Education comprehension: verbalized  understanding  HOME EXERCISE PROGRAM: tba  ASSESSMENT:  CLINICAL IMPRESSION: Pt has a gradual increase in LBP with todays session.  She does initially report lower pain level at onset of session 5/10 but increases as session progresses.  Discussed optimal pain relief with water submersion and/or gradual decrease in pain post aquatic intervention which she has not yet experienced. Pain rises today with walking. And with noodle wrapped posteriorly across chest.  Reduces with anterior placement and L stretching. She is able to increase foam resistance with core engagement exercises.  Goals ongoing   Initial Impression Patient is a 68 y.o. f who was seen today for physical therapy evaluation and treatment for LBP. She has a long hx of chronic back pain dating back into the 1970's.  She reports multiple episodes of PT throughout the years which have helped to ease her pain.  She is new in town in past year and just getting settled in new home.  Pain exacerbation due some to unpacking.  She does have an  aide 11 hours a week to assist with ADL's and household chores.  She reports limitations where any static standing or bending/twisting is involved.  Relies on aide to help her with bathing.  She has had aquatic therapy in past.  Does not yet have access to a pool.  She is given list of area pools and is encouraged to check them out.  Her pain sensitivity is high today and she does not tolerate much objective or functional testing.  Will plan to test as pt can better tolerate. She is significantly limited in all areas and will benefit from skilled PT intervention to improve deficits, decrease and manage pain, as well as improve safety and QOL.  OBJECTIVE IMPAIRMENTS: Abnormal gait, decreased activity tolerance, decreased balance, decreased endurance, decreased knowledge of condition, decreased knowledge of use of DME, decreased mobility, difficulty walking, decreased ROM, decreased strength, impaired perceived functional ability, impaired flexibility, postural dysfunction, obesity, and pain.   ACTIVITY LIMITATIONS: carrying, lifting, bending, sitting, standing, squatting, sleeping, stairs, transfers, bed mobility, dressing, hygiene/grooming, and locomotion level  PARTICIPATION LIMITATIONS: meal prep, cleaning, laundry, shopping, community activity, and yard work  PERSONAL FACTORS: Past/current experiences, Time since onset of injury/illness/exacerbation, and 1-2 comorbidities: see problem list  are also affecting patient's functional outcome.   REHAB POTENTIAL: Good  CLINICAL DECISION MAKING: Evolving/moderate complexity  EVALUATION COMPLEXITY: Moderate   GOALS: Goals reviewed with patient? Yes  SHORT TERM GOALS: Target date: 06/29/23  Pt will tolerate full aquatic sessions consistently without increase in pain and with improving function to demonstrate good toleration and effectiveness of intervention.  Baseline: Goal status: INITIAL  2.  Pt will amb to and from setting consistently using ad  along with completing entire aquatics session without excessive fatigue or pain Baseline:  Goal status: INITIAL  3.  Pt will complete tandem and SLS x >15s in 3.6 ft with ue support as needed Baseline:  Goal status: INITIAL  4.  Pt to be indep with initial aquatic HEP and gain access to personal pool for completion Baseline: pt given area pool handout Goal status: INITIAL   LONG TERM GOALS: Target date: 08/10/23  Pt to improve Foto core by 10% to demonstrate improved perceived functional ability Baseline: 24% Goal status: INITIAL  2.  Pt will improve LE strength to at least 4/5 to demonstrate improved toleration to testing and increase safety with function Baseline:  Goal status: INITIAL  3.  Pt will stand NBOS and semi tandem  x 20s to demonstrate improve balance Baseline:  Goal status: INITIAL  4.  Pt will report decrease in LBP with amb by 3-4 NPRS to demonstrated improved ability to tolerate ADL's. Baseline:  Goal status: INITIAL  5.  Pt will improve on 3 MWT to 273ft demonstrating improved toleration to amb in home environment Baseline:  Goal status: INITIAL  6. Pt will improve on Tug test to <or= 18s to demonstrate improvement in lower extremity function, mobility and decreased fall risk. Baseline: 25.85 Goal status: INITIAL  PLAN:  PT FREQUENCY: 2x/week  PT DURATION: 10 weeks  PLANNED INTERVENTIONS: 97164- PT Re-evaluation, 97110-Therapeutic exercises, 97530- Therapeutic activity, 97112- Neuromuscular re-education, 97535- Self Care, 02859- Manual therapy, [phone]- Gait training, [phone]- Orthotic Fit/training, [phone]- Aquatic Therapy, 97014- Electrical stimulation (unattended), [phone]- Ionotophoresis 4mg /ml Dexamethasone , Patient/Family education, Balance training, Stair training, Taping, Dry Needling, Joint mobilization, DME instructions, Cryotherapy, and Moist heat.  PLAN FOR NEXT SESSION: Aquatics only: pain management/relief, general strengthening, stretching, gait  and balance retraining, assist with gaining access to pool   [place] [facility]) Jullie Arps MPT 06/22/23 9:11 AM [facility] [address] [place], [place], [address] Phone: [phone]   Fax:  [phone]

## 2023-06-26 ENCOUNTER — Ambulatory Visit (HOSPITAL_BASED_OUTPATIENT_CLINIC_OR_DEPARTMENT_OTHER): Payer: Medicare Other | Admitting: Physical Therapy

## 2023-06-26 ENCOUNTER — Ambulatory Visit: Payer: Medicare Other | Admitting: Psychiatry

## 2023-06-28 ENCOUNTER — Ambulatory Visit: Payer: Medicare Other | Attending: Cardiology | Admitting: Cardiology

## 2023-06-28 ENCOUNTER — Encounter: Payer: Self-pay | Admitting: Cardiology

## 2023-06-28 VITALS — BP 102/68 | HR 58 | Ht 62.0 in | Wt 183.0 lb

## 2023-06-28 DIAGNOSIS — G4733 Obstructive sleep apnea (adult) (pediatric): Secondary | ICD-10-CM | POA: Diagnosis not present

## 2023-06-28 DIAGNOSIS — E7849 Other hyperlipidemia: Secondary | ICD-10-CM | POA: Insufficient documentation

## 2023-06-28 DIAGNOSIS — R072 Precordial pain: Secondary | ICD-10-CM | POA: Insufficient documentation

## 2023-06-28 LAB — BASIC METABOLIC PANEL
BUN/Creatinine Ratio: 14 (ref 12–28)
BUN: 10 mg/dL (ref 8–27)
CO2: 23 mmol/L (ref 20–29)
Calcium: 11.7 mg/dL — ABNORMAL HIGH (ref 8.7–10.3)
Chloride: 103 mmol/L (ref 96–106)
Creatinine, Ser: 0.69 mg/dL (ref 0.57–1.00)
Glucose: 94 mg/dL (ref 70–99)
Potassium: 4.9 mmol/L (ref 3.5–5.2)
Sodium: 140 mmol/L (ref 134–144)
eGFR: 96 mL/min/{1.73_m2} (ref >59)

## 2023-06-28 MED ORDER — METOPROLOL TARTRATE 25 MG PO TABS: ORAL_TABLET | ORAL | 0 refills | Status: DC

## 2023-06-28 NOTE — Patient Instructions (Addendum)
 Medication Instructions:  Continue same medications   Lab Work: Bmet today   Testing/Procedures: Coronary CT  will be scheduled after approved by insurance   Follow instructions below   Follow-Up: At [facility], you and your health needs are our priority.  As part of our continuing mission to provide you with exceptional heart care, we have created designated Provider Care Teams.  These Care Teams include your primary Cardiologist (physician) and Advanced Practice Providers (APPs -  Physician Assistants and Nurse Practitioners) who all work together to provide you with the care you need, when you need it.  We recommend signing up for the patient portal called MyChart.  Sign up information is provided on this After Visit Summary.  MyChart is used to connect with patients for Virtual Visits (Telemedicine).  Patients are able to view lab/test results, encounter notes, upcoming appointments, etc.  Non-urgent messages can be sent to your provider as well.   To learn more about what you can do with MyChart, go to [email or web address].    Your next appointment:  To Be Determined    Provider:  Dr.Jordan      Your cardiac CT will be scheduled at one of the below locations:   [facility] [address] [place], [place] [address] [phone]  OR  [facility] [address] [place], [place] [address] [phone]  OR   [facility] [address] [place], [place] [address] [phone]  If scheduled at [facility], please arrive at the [facility] and Children's Entrance (Entrance C2) of [facility] 30 minutes prior to test start time. You can use the FREE valet parking offered at entrance C (encouraged to control the heart rate for the test)  Proceed to the [facility] Radiology Department (first floor) to check-in and test prep.  All radiology patients and guests should  use entrance C2 at [facility], accessed from [facility], even though the hospital's physical address listed is [address]    If scheduled at [facility] or [facility], please arrive 15 mins early for check-in and test prep.  There is spacious parking and easy access to the radiology department from the [facility] Heart and Vascular entrance. Please enter here and check-in with the desk attendant.   Please follow these instructions carefully (unless otherwise directed):  An IV will be required for this test and Nitroglycerin  will be given.    On the Night Before the Test: Be sure to Drink plenty of water. Do not consume any caffeinated/decaffeinated beverages or chocolate 12 hours prior to your test. Do not take any antihistamines 12 hours prior to your test.   On the Day of the Test: Drink plenty of water until 1 hour prior to the test. Do not eat any food 1 hour prior to test. You may take your regular medications prior to the test.  Take metoprolol  25 mg two hours prior to test. If you take Furosemide/Hydrochlorothiazide/Spironolactone/Chlorthalidone, please HOLD on the morning of the test. Patients who wear a continuous glucose monitor MUST remove the device prior to scanning. FEMALES- please wear underwire-free bra if available, avoid dresses & tight clothing       After the Test: Drink plenty of water. After receiving IV contrast, you may experience a mild flushed feeling. This is normal. On occasion, you may experience a mild rash up to 24 hours after the test.  This is not dangerous. If this occurs, you can take Benadryl 25 mg and increase your fluid intake. If you experience trouble breathing, this can be serious. If it is severe call 911 IMMEDIATELY. If it is mild, please call our office.  We will call to schedule your test 2-4 weeks out understanding that some insurance companies will need an  authorization prior to the service being performed.   For more information and frequently asked questions, please visit our website : [email or web address]  For non-scheduling related questions, please contact the cardiac imaging nurse navigator should you have any questions/concerns: Cardiac Imaging Nurse Navigators Direct Office Dial: [phone]   For scheduling needs, including cancellations and rescheduling, please call Brittany, [phone].

## 2023-06-28 NOTE — Addendum Note (Signed)
 Addended by: Neoma Laming on: 06/28/2023 09:33 AM   Modules accepted: Orders

## 2023-06-29 ENCOUNTER — Ambulatory Visit (HOSPITAL_BASED_OUTPATIENT_CLINIC_OR_DEPARTMENT_OTHER): Payer: Medicare Other | Admitting: Physical Therapy

## 2023-07-03 ENCOUNTER — Other Ambulatory Visit: Payer: Self-pay | Admitting: Family Medicine

## 2023-07-03 ENCOUNTER — Other Ambulatory Visit: Payer: Self-pay

## 2023-07-03 ENCOUNTER — Telehealth: Payer: Self-pay

## 2023-07-03 DIAGNOSIS — Z0000 Encounter for general adult medical examination without abnormal findings: Secondary | ICD-10-CM

## 2023-07-03 NOTE — Telephone Encounter (Signed)
 Received a call from patient calling to let Dr.Jordan know she is taking Mardeen C which has 275 mg of Calcium  and Magnesium which has 65% Calcium .She also takes a Multi Vit.Spoke to Dr.Jordan he advised to stop taking Esther C and stop Magnesium.Ok to continue Mult Vit.Advised to repeat calcium  level in 3 weeks.Order placed.

## 2023-07-05 ENCOUNTER — Ambulatory Visit: Payer: Medicare Other | Admitting: Professional Counselor

## 2023-07-05 ENCOUNTER — Encounter (HOSPITAL_BASED_OUTPATIENT_CLINIC_OR_DEPARTMENT_OTHER): Payer: Self-pay | Admitting: Physical Therapy

## 2023-07-05 ENCOUNTER — Ambulatory Visit (HOSPITAL_BASED_OUTPATIENT_CLINIC_OR_DEPARTMENT_OTHER): Payer: Medicare Other | Admitting: Physical Therapy

## 2023-07-05 ENCOUNTER — Encounter: Payer: Self-pay | Admitting: Professional Counselor

## 2023-07-05 DIAGNOSIS — F411 Generalized anxiety disorder: Secondary | ICD-10-CM

## 2023-07-05 DIAGNOSIS — M6281 Muscle weakness (generalized): Secondary | ICD-10-CM

## 2023-07-05 DIAGNOSIS — F319 Bipolar disorder, unspecified: Secondary | ICD-10-CM | POA: Diagnosis not present

## 2023-07-05 DIAGNOSIS — R2689 Other abnormalities of gait and mobility: Secondary | ICD-10-CM

## 2023-07-05 DIAGNOSIS — M5459 Other low back pain: Secondary | ICD-10-CM | POA: Diagnosis not present

## 2023-07-05 DIAGNOSIS — R293 Abnormal posture: Secondary | ICD-10-CM

## 2023-07-05 NOTE — Therapy (Signed)
OUTPATIENT PHYSICAL THERAPY THORACOLUMBAR TREATMENT   Patient Name: Brittany [name] MRN: [id number] DOB:1956-04-03, 68 y.o., female Today's Date: 07/05/2023  END OF SESSION:  PT End of Session - 07/05/23 1029     Visit Number 6    Number of Visits 16    Date for PT Re-Evaluation 08/10/23    Authorization Type MCR    Progress Note Due on Visit 10    PT Start Time 1030    PT Stop Time 1115    PT Time Calculation (min) 45 min    Activity Tolerance Patient tolerated treatment well    Behavior During Therapy WFL for tasks assessed/performed             Past Medical History:  Diagnosis Date   ACNE NEC    ALLERGIC RHINITIS    ALOPECIA    Anal fissure    Anxiety    Panic Attack.  None in a long time   Arthritis    Asymptomatic microscopic hematuria 12/05/2022   Bowel obstruction (HCC)    Breast mass 12/05/2022   Sep 04, 2008 Entered By: Bennie Pierini B Comment: benign on bx   Chronic headaches    migraine- none in years   Constipation    drinks tea with senna   DEPRESSION    Depression    DISORDER, BIPOLAR NEC    Family history of breast cancer    Family history of pancreatic cancer    GERD (gastroesophageal reflux disease)    HEMORRHOIDS-INTERNAL    History of stress test    ETT 10/17: Ex 7'; no chest pain, no ST changes, [facility] Treadmill Score 7   HYPERLIPIDEMIA    OBSTRUCTIVE SLEEP APNEA    Seasonal allergies    Past Surgical History:  Procedure Laterality Date   BREAST LUMPECTOMY Right last done 2005   x 2   CHOLECYSTECTOMY N/A 03/07/2014   Procedure: LAPAROSCOPIC CHOLECYSTECTOMY WITH INTRAOPERATIVE CHOLANGIOGRAM;  Surgeon: Claud Kelp, MD;  Location: [facility] OR;  Service: General;  Laterality: N/A;   EUS N/A 01/27/2014   Procedure: ESOPHAGEAL ENDOSCOPIC ULTRASOUND (EUS) RADIAL;  Surgeon: Willis Modena, MD;  Location: [facility];  Service: Endoscopy;  Laterality: N/A;   EYE SURGERY  08/2021   Cataract surgey   HAND SURGERY Bilateral    for arthritis  left  02/02/14- right approx 2014   KNEE SURGERY Bilateral    right x 2 left x 1.  arthroscopy   RADICAL HYSTERECTOMY     thumb and pinkie finger surgery Right 06/19/2012   TONSILLECTOMY     Patient Active Problem List   Diagnosis Date Noted   Chronic pain syndrome 12/05/2022   Urinary incontinence 12/05/2022   Plantar fasciitis 12/05/2022   Insomnia 12/05/2022   Dry skin 12/05/2022   Dizziness and giddiness 12/05/2022   Cataract 12/05/2022   Anxiety 12/05/2022   Chloasma 12/05/2022   Cyst of kidney, acquired 12/05/2022   Cyst of thyroid 12/05/2022   Chronic sinusitis, unspecified 09/06/2022   Spinal stenosis of lumbar region 01/10/2022   Ex-cigarette smoker 08/23/2021   Tobacco dependence in remission 06/09/2020   Spondylolysis 05/27/2018   Unspecified inflammatory spondylopathy, lumbar region (HCC) 05/27/2018   Lumbar spondylosis 04/15/2018   Other hyperlipidemia 06/21/2017   Acquired absence of other genital organ(s) 06/05/2017   Headache 01/26/2017   Neck pain 11/07/2016   Radon exposure 04/03/2016   Allergic rhinitis 02/15/2016   Genetic testing 09/09/2015   Primary osteoarthritis of right knee 09/09/2015   Family history of breast  cancer    Family history of pancreatic cancer    Chronic bilateral low back pain without sciatica 04/20/2015   Trochanteric bursitis of left hip 04/20/2015   Cervical myofascial pain syndrome 04/20/2015   Medicare annual wellness visit, subsequent 02/10/2015   Gallstones 02/16/2014   Hyperparathyroidism (HCC) 01/29/2013   Chronic back pain 10/21/2012   ALOPECIA 03/05/2009   Obstructive sleep apnea 10/06/2008   HEMORRHOIDS-INTERNAL 04/28/2008   History of colonic polyps 04/24/2008   Hyperlipidemia LDL goal <100 03/20/2007   DISORDER, BIPOLAR NEC 03/20/2007   Depression 03/20/2007    PCP: Hetty Blend NP  REFERRING PROVIDER: Venita Lick MD  REFERRING DIAG: M54.51 (ICD-10-CM) - Vertebrogenic low back pain   Rationale for Evaluation  and Treatment: Rehabilitation  THERAPY DIAG:  Other low back pain  Muscle weakness (generalized)  Abnormal posture  Other abnormalities of gait and mobility  ONSET DATE: >40 yrs  SUBJECTIVE:                                                                                                                                                                                           SUBJECTIVE STATEMENT: "Sat at a 2.5 hour show last night and my back is killing me"  POOL ACCESS:  Pt interested in joining [place], but has not yet.   Initial subjective I am trying therapy to avoid back surgery.  [facility]  Had massages before in last home state and it helped for a [place] while. Has aide 11 hours a week. She helps me bath. Bending putting dishes in dishwasher, getting laundry out of dryer. New In town in past year.   PERTINENT HISTORY:  KNEE oa receiving gel shots Chronic pain syndrome Bi-polar  PAIN:  Are you having pain? Yes: NPRS scale: 7/10; Pain location: LB with radiation into left foot sometimes right Pain description: piercing down left leg > right Aggravating factors: bending, twisting, sitting upright, cold, standing x 5 mins; walking 5-10 min Relieving factors: meds  PRECAUTIONS: None  RED FLAGS: None   WEIGHT BEARING RESTRICTIONS: No  FALLS:  Has patient fallen in last 6 months? No  LIVING ENVIRONMENT: Lives with: lives alone Lives in: House/apartment Stairs: No Has following equipment at home: Single point cane, Environmental consultant - 4 wheeled, Marine scientist  OCCUPATION: disabled  PLOF: Needs assistance with ADLs and Needs assistance with homemaking  PATIENT GOALS: decrease pain; walk better  NEXT MD VISIT: 4-5 weeks  OBJECTIVE:  Note: Objective measures were completed at Evaluation unless otherwise noted.  DIAGNOSTIC FINDINGS:  MRI of the lumbar spine as well as x-rays lumbar  spine region. She does have a grade 1  spondylolisthesis on plain films on the lateral view. Significant spondylitic disease at L4 5 greater than L5 1   PATIENT SURVEYS:  FOTO Primary measure 24%; risk adjusted 42%; goal 40%  COGNITION: Overall cognitive status: Within functional limits for tasks assessed     SENSATION: WFL  MUSCLE LENGTH: Hamstrings: bilaterally tight visualized in sitting   POSTURE: rounded shoulders, forward head, and decreased lumbar lordosis  PALPATION: TTP  LUMBAR ROM:  High pain sensitivity limiting lumbar ROM AROM eval  Flexion 100% limited  Extension 100% limited  Right lateral flexion 100% limited  Left lateral flexion 90% limited  Right rotation   Left rotation    (Blank rows = not tested)  LOWER EXTREMITY ROM:      Not tested due to pain sensitivity  LOWER EXTREMITY MMT:     Not tested due to pain sensitivity. At least 3/5 in hip and knee   LUMBAR SPECIAL TESTS:  Not tolerated  FUNCTIONAL TESTS:  5 times sit to stand: TBA Timed up and go (TUG): 25.85 3 stage balance: NBOS needs assist gaining position. 3 MWT:167ft 1 standing rest period, no ad  GAIT: Distance walked: 123 Assistive device utilized: None pt encouraged to use rollator when coming to sessions for toleration to exercise as well as safety.  She VU Level of assistance: SBA Comments: Increased time spent in stance bilaterally, lateral displacement  TODAY'S TREATMENT:                                                                                                                              Pt seen for aquatic therapy today.  Treatment took place in water 3.5-4.75 ft in depth at the [facility] pool. Temp of water was 91.  Pt entered/exited the pool via stairs  with hand rail.   *unsupported, walking forward/ backward - multiple laps  * farmer carry with bilat rainbow hand floats under water at side with marching forward/ backward and in place * side stepping x 4 lap; with addct/ abdct with rainbow  hand floats x 4 laps * holding wall: L stretch; abd stretch; hamstring stretch *TrA sets: half hollow noodle-> full hollow noodle: wide stance x10  then staggered stance x 10 *decompression with noodle wrapped posteriorly (increases sciatic pain) then anteriorly across chest   - cycling; hip abdct/ addct, clams; hip flex/ext *[place] x 10.  VC and demonstration for execution (difficult) *Jogging in place 4.6 ft shoulder horizontal add/abd *L stretch  Pt requires the buoyancy and hydrostatic pressure of water for support, and to offload joints by unweighting joint load by at least 50 % in navel deep water and by at least 75-80% in chest to neck deep water.  Viscosity of the water is needed for resistance of strengthening. Water current perturbations provides challenge to standing balance requiring increased core activation.     PATIENT EDUCATION:  Education details: aquatic  therapy intro Person educated: Patient Education method: Explanation Education comprehension: verbalized understanding  HOME EXERCISE PROGRAM: tba  ASSESSMENT:  CLINICAL IMPRESSION: Pain sensitivity slightly higher today after sitting for extended time at show yesterday. We focused on gentle LB and hip ROM and stretching for reduction in symptoms.  Decompression position with LE movement (cycling, add/abd) as well as L stretch feels good with resulting decline in pain.  She has met multiple Stgs with excellent toleration to aquatic sessions and walking using cane to and from setting indep without increase in pain or excessive fatigue.  She has not yet gained pool access but intends to by DC. Goals ongoing   Initial Impression Patient is a 68 y.o. f who was seen today for physical therapy evaluation and treatment for LBP. She has a long hx of chronic back pain dating back into the 1970's.  She reports multiple episodes of PT throughout the years which have helped to ease her pain.  She is new in town in past year  and just getting settled in new home.  Pain exacerbation due some to unpacking.  She does have an aide 11 hours a week to assist with ADL's and household chores.  She reports limitations where any static standing or bending/twisting is involved.  Relies on aide to help her with bathing.  She has had aquatic therapy in past.  Does not yet have access to a pool.  She is given list of area pools and is encouraged to check them out.  Her pain sensitivity is high today and she does not tolerate much objective or functional testing.  Will plan to test as pt can better tolerate. She is significantly limited in all areas and will benefit from skilled PT intervention to improve deficits, decrease and manage pain, as well as improve safety and QOL.  OBJECTIVE IMPAIRMENTS: Abnormal gait, decreased activity tolerance, decreased balance, decreased endurance, decreased knowledge of condition, decreased knowledge of use of DME, decreased mobility, difficulty walking, decreased ROM, decreased strength, impaired perceived functional ability, impaired flexibility, postural dysfunction, obesity, and pain.   ACTIVITY LIMITATIONS: carrying, lifting, bending, sitting, standing, squatting, sleeping, stairs, transfers, bed mobility, dressing, hygiene/grooming, and locomotion level  PARTICIPATION LIMITATIONS: meal prep, cleaning, laundry, shopping, community activity, and yard work  PERSONAL FACTORS: Past/current experiences, Time since onset of injury/illness/exacerbation, and 1-2 comorbidities: see problem list  are also affecting patient's functional outcome.   REHAB POTENTIAL: Good  CLINICAL DECISION MAKING: Evolving/moderate complexity  EVALUATION COMPLEXITY: Moderate   GOALS: Goals reviewed with patient? Yes  SHORT TERM GOALS: Target date: 06/29/23  Pt will tolerate full aquatic sessions consistently without increase in pain and with improving function to demonstrate good toleration and effectiveness of  intervention.  Baseline: Goal status: Met 07/05/23  2.  Pt will amb to and from setting consistently using ad along with completing entire aquatics session without excessive fatigue or pain Baseline: Met 07/05/23  3.  Pt will complete tandem and SLS x >15s in 3.6 ft with ue support as needed Baseline:  Goal status: INITIAL  4.  Pt to be indep with initial aquatic HEP and gain access to personal pool for completion Baseline: pt given area pool handout Goal status: In progress 07/05/23   LONG TERM GOALS: Target date: 08/10/23  Pt to improve Foto core by 10% to demonstrate improved perceived functional ability Baseline: 24% Goal status: INITIAL  2.  Pt will improve LE strength to at least 4/5 to demonstrate improved toleration to testing and increase  safety with function Baseline:  Goal status: INITIAL  3.  Pt will stand NBOS and semi tandem x 20s to demonstrate improve balance Baseline:  Goal status: INITIAL  4.  Pt will report decrease in LBP with amb by 3-4 NPRS to demonstrated improved ability to tolerate ADL's. Baseline:  Goal status: INITIAL  5.  Pt will improve on 3 MWT to 219ft demonstrating improved toleration to amb in home environment Baseline:  Goal status: INITIAL  6. Pt will improve on Tug test to <or= 18s to demonstrate improvement in lower extremity function, mobility and decreased fall risk. Baseline: 25.85 Goal status: INITIAL  PLAN:  PT FREQUENCY: 2x/week  PT DURATION: 10 weeks  PLANNED INTERVENTIONS: 97164- PT Re-evaluation, 97110-Therapeutic exercises, 97530- Therapeutic activity, 97112- Neuromuscular re-education, 97535- Self Care, 06269- Manual therapy, [phone]- Gait training, [phone]- Orthotic Fit/training, [phone]- Aquatic Therapy, 97014- Electrical stimulation (unattended), [phone]- Ionotophoresis 4mg /ml Dexamethasone, Patient/Family education, Balance training, Stair training, Taping, Dry Needling, Joint mobilization, DME instructions, Cryotherapy, and  Moist heat.  PLAN FOR NEXT SESSION: Aquatics only: pain management/relief, general strengthening, stretching, gait and balance retraining, assist with gaining access to pool   [place] Tomma Lightning) Annalisia Ingber MPT 07/05/23 10:32 AM [facility] [address] [place], [place], [address] Phone: [phone]   Fax:  [phone]

## 2023-07-05 NOTE — Progress Notes (Signed)
      [facility] Counselor/Therapist Progress Note  Patient ID: Brittany [name], MRN: [id number],    Date: 07/05/2023  Time Spent: 3:15 PM to 4:11 PM  Treatment Type: Individual Therapy  Reported Symptoms: Agitation, irritability, worries, grief/loss, somatic concerns (back pain) low mood, stress, interpersonal concerns  Mental Status Exam:  Appearance:   Casual     Behavior:  Appropriate, Sharing, and Motivated  Motor:  Normal  Speech/Language:   Clear and Coherent and Normal Rate  Affect:  Appropriate and Congruent  Mood:  normal  Thought process:  normal  Thought content:    WNL  Sensory/Perceptual disturbances:    WNL  Orientation:  oriented to person, place, time/date, and situation  Attention:  Good  Concentration:  Good  Memory:  WNL  Fund of knowledge:   Good  Insight:    Good  Judgment:   Good  Impulse Control:  Good   Risk Assessment: Danger to Self:  No Self-injurious Behavior: No Danger to Others: No Duty to Warn:no Physical Aggression / Violence:No  Access to Firearms a concern: No  Gang Involvement:No   Subjective: Patient presented to session to address concerns of bipolar depression and anxiety.  She reported limited progress at this time.  She reported her drinking of wine and eating to have increased due to seasonal pattern she engages in by history. She voiced having ordered a seasonal affective disorder lamp to help in alleviating related symptomology.  Patient processed experience of having visited with ex-husband, spending time with a friend, and her concern regarding another friend with whom she has needed to place boundaries.  Counselor actively listened, and affirmed patient proactive efforts and boundary implementation.  Counselor helped patient identify short-term goals related to coping skills for mitigation of depressive and anxious symptoms including increased self identified self-care objectives.  Interventions: Solution-Oriented/Positive  Psychology, Humanistic/Existential, and Insight-Oriented  Diagnosis:   ICD-10-CM   1. Bipolar I disorder with seasonal pattern (HCC)  F31.9     2. Generalized anxiety disorder  F41.1       Plan: Patient is scheduled for follow-up; continue process work and developing coping skills.  Patient short-term goal between session to focus on physical wellbeing including physical therapy, her weight, her grooming; to work on her spirituality by way of increasing prayer life, being mindful of greater acceptance of others, and increase in field service; to increase social life efforts.  Progress note was dictated with Dragon and reviewed for accuracy.  Gaspar Bidding, [facility]

## 2023-07-10 ENCOUNTER — Ambulatory Visit (HOSPITAL_BASED_OUTPATIENT_CLINIC_OR_DEPARTMENT_OTHER): Payer: Medicare Other | Admitting: Physical Therapy

## 2023-07-12 ENCOUNTER — Ambulatory Visit (HOSPITAL_BASED_OUTPATIENT_CLINIC_OR_DEPARTMENT_OTHER): Payer: Medicare Other | Admitting: Physical Therapy

## 2023-07-12 ENCOUNTER — Encounter (HOSPITAL_BASED_OUTPATIENT_CLINIC_OR_DEPARTMENT_OTHER): Payer: Self-pay | Admitting: Physical Therapy

## 2023-07-12 DIAGNOSIS — R2689 Other abnormalities of gait and mobility: Secondary | ICD-10-CM

## 2023-07-12 DIAGNOSIS — R293 Abnormal posture: Secondary | ICD-10-CM

## 2023-07-12 DIAGNOSIS — M5459 Other low back pain: Secondary | ICD-10-CM | POA: Diagnosis not present

## 2023-07-12 DIAGNOSIS — M6281 Muscle weakness (generalized): Secondary | ICD-10-CM

## 2023-07-12 NOTE — Therapy (Signed)
OUTPATIENT PHYSICAL THERAPY THORACOLUMBAR TREATMENT   Patient Name: Brittany [name] MRN: [id number] DOB:September 30, 1955, 68 y.o., female Today's Date: 07/12/2023  END OF SESSION:  PT End of Session - 07/12/23 1459     Visit Number 7    Number of Visits 16    Date for PT Re-Evaluation 08/10/23    Authorization Type MCR    Progress Note Due on Visit 10    PT Start Time 1446    PT Stop Time 1524    PT Time Calculation (min) 38 min    Activity Tolerance Patient tolerated treatment well    Behavior During Therapy WFL for tasks assessed/performed             Past Medical History:  Diagnosis Date   ACNE NEC    ALLERGIC RHINITIS    ALOPECIA    Anal fissure    Anxiety    Panic Attack.  None in a long time   Arthritis    Asymptomatic microscopic hematuria 12/05/2022   Bowel obstruction (HCC)    Breast mass 12/05/2022   Sep 04, 2008 Entered By: Bennie Pierini B Comment: benign on bx   Chronic headaches    migraine- none in years   Constipation    drinks tea with senna   DEPRESSION    Depression    DISORDER, BIPOLAR NEC    Family history of breast cancer    Family history of pancreatic cancer    GERD (gastroesophageal reflux disease)    HEMORRHOIDS-INTERNAL    History of stress test    ETT 10/17: Ex 7'; no chest pain, no ST changes, [facility] Treadmill Score 7   HYPERLIPIDEMIA    OBSTRUCTIVE SLEEP APNEA    Seasonal allergies    Past Surgical History:  Procedure Laterality Date   BREAST LUMPECTOMY Right last done 2005   x 2   CHOLECYSTECTOMY N/A 03/07/2014   Procedure: LAPAROSCOPIC CHOLECYSTECTOMY WITH INTRAOPERATIVE CHOLANGIOGRAM;  Surgeon: Claud Kelp, MD;  Location: [facility] OR;  Service: General;  Laterality: N/A;   EUS N/A 01/27/2014   Procedure: ESOPHAGEAL ENDOSCOPIC ULTRASOUND (EUS) RADIAL;  Surgeon: Willis Modena, MD;  Location: [facility] END[facility];  Service: Endoscopy;  Laterality: N/A;   EYE SURGERY  08/2021   Cataract surgey   HAND SURGERY Bilateral    for arthritis  left  02/02/14- right approx 2014   KNEE SURGERY Bilateral    right x 2 left x 1.  arthroscopy   RADICAL HYSTERECTOMY     thumb and pinkie finger surgery Right 06/19/2012   TONSILLECTOMY     Patient Active Problem List   Diagnosis Date Noted   Chronic pain syndrome 12/05/2022   Urinary incontinence 12/05/2022   Plantar fasciitis 12/05/2022   Insomnia 12/05/2022   Dry skin 12/05/2022   Dizziness and giddiness 12/05/2022   Cataract 12/05/2022   Anxiety 12/05/2022   Chloasma 12/05/2022   Cyst of kidney, acquired 12/05/2022   Cyst of thyroid 12/05/2022   Chronic sinusitis, unspecified 09/06/2022   Spinal stenosis of lumbar region 01/10/2022   Ex-cigarette smoker 08/23/2021   Tobacco dependence in remission 06/09/2020   Spondylolysis 05/27/2018   Unspecified inflammatory spondylopathy, lumbar region (HCC) 05/27/2018   Lumbar spondylosis 04/15/2018   Other hyperlipidemia 06/21/2017   Acquired absence of other genital organ(s) 06/05/2017   Headache 01/26/2017   Neck pain 11/07/2016   Radon exposure 04/03/2016   Allergic rhinitis 02/15/2016   Genetic testing 09/09/2015   Primary osteoarthritis of right knee 09/09/2015   Family history of breast  cancer    Family history of pancreatic cancer    Chronic bilateral low back pain without sciatica 04/20/2015   Trochanteric bursitis of left hip 04/20/2015   Cervical myofascial pain syndrome 04/20/2015   Medicare annual wellness visit, subsequent 02/10/2015   Gallstones 02/16/2014   Hyperparathyroidism (HCC) 01/29/2013   Chronic back pain 10/21/2012   ALOPECIA 03/05/2009   Obstructive sleep apnea 10/06/2008   HEMORRHOIDS-INTERNAL 04/28/2008   History of colonic polyps 04/24/2008   Hyperlipidemia LDL goal <100 03/20/2007   DISORDER, BIPOLAR NEC 03/20/2007   Depression 03/20/2007    PCP: Hetty Blend NP  REFERRING PROVIDER: Venita Lick MD  REFERRING DIAG: M54.51 (ICD-10-CM) - Vertebrogenic low back pain   Rationale for Evaluation  and Treatment: Rehabilitation  THERAPY DIAG:  Other low back pain  Muscle weakness (generalized)  Abnormal posture  Other abnormalities of gait and mobility  ONSET DATE: >40 yrs  SUBJECTIVE:                                                                                                                                                                                           SUBJECTIVE STATEMENT: "My pain is up today 10/10 when I got up.  I had to take meds this morning and feel out of it. My pain is way down from the meds 2/10"  POOL ACCESS:  Pt interested in joining [employer], but has not yet.   Initial subjective I am trying therapy to avoid back surgery.  [facility]  Had massages before in last home state and it helped for a [place] while. Has aide 11 hours a week. She helps me bath. Bending putting dishes in dishwasher, getting laundry out of dryer. New In town in past year.   PERTINENT HISTORY:  KNEE oa receiving gel shots Chronic pain syndrome Bi-polar  PAIN:  Are you having pain? Yes: NPRS scale: 2/10; Pain location: LB with radiation into left foot sometimes right Pain description: piercing down left leg > right Aggravating factors: bending, twisting, sitting upright, cold, standing x 5 mins; walking 5-10 min Relieving factors: meds  PRECAUTIONS: None  RED FLAGS: None   WEIGHT BEARING RESTRICTIONS: No  FALLS:  Has patient fallen in last 6 months? No  LIVING ENVIRONMENT: Lives with: lives alone Lives in: House/apartment Stairs: No Has following equipment at home: Single point cane, Environmental consultant - 4 wheeled, Marine scientist  OCCUPATION: disabled  PLOF: Needs assistance with ADLs and Needs assistance with homemaking  PATIENT GOALS: decrease pain; walk better  NEXT MD VISIT: 4-5 weeks  OBJECTIVE:  Note: Objective measures were completed at  Evaluation unless otherwise noted.  DIAGNOSTIC FINDINGS:  MRI of the  lumbar spine as well as x-rays lumbar spine region. She does have a grade 1 spondylolisthesis on plain films on the lateral view. Significant spondylitic disease at L4 5 greater than L5 1   PATIENT SURVEYS:  FOTO Primary measure 24%; risk adjusted 42%; goal 40%  COGNITION: Overall cognitive status: Within functional limits for tasks assessed     SENSATION: WFL  MUSCLE LENGTH: Hamstrings: bilaterally tight visualized in sitting   POSTURE: rounded shoulders, forward head, and decreased lumbar lordosis  PALPATION: TTP  LUMBAR ROM:  High pain sensitivity limiting lumbar ROM AROM eval  Flexion 100% limited  Extension 100% limited  Right lateral flexion 100% limited  Left lateral flexion 90% limited  Right rotation   Left rotation    (Blank rows = not tested)  LOWER EXTREMITY ROM:      Not tested due to pain sensitivity  LOWER EXTREMITY MMT:     Not tested due to pain sensitivity. At least 3/5 in hip and knee   LUMBAR SPECIAL TESTS:  Not tolerated  FUNCTIONAL TESTS:  5 times sit to stand: TBA Timed up and go (TUG): 25.85 3 stage balance: NBOS needs assist gaining position. 3 MWT:159ft 1 standing rest period, no ad  GAIT: Distance walked: 123 Assistive device utilized: None pt encouraged to use rollator when coming to sessions for toleration to exercise as well as safety.  She VU Level of assistance: SBA Comments: Increased time spent in stance bilaterally, lateral displacement  TODAY'S TREATMENT:                                                                                                                              Pt seen for aquatic therapy today.  Treatment took place in water 3.5-4.75 ft in depth at the [facility] pool. Temp of water was 91.  Pt entered/exited the pool via stairs  with hand rail.   *unsupported, walking forward/ backward - multiple laps  * farmer carry with bilat then unilateral rainbow hand floats under water at side with  marching forward/ backward and in place * side stepping x 4 lap; with addct/ abdct with rainbow hand floats x 4 laps * holding wall: L stretch; abd stretch; hamstring stretch *TrA full solid noodle: wide stance x10  then staggered stance x 10 *decompression with noodle wrapped posteriorly (increases sciatic pain) then anteriorly across chest   - cycling; hip abdct/ addct, clams; hip flex/ext *[place] x 10.  VC and demonstration for execution *L stretch between exercises for stretching as it relieves   Pt requires the buoyancy and hydrostatic pressure of water for support, and to offload joints by unweighting joint load by at least 50 % in navel deep water and by at least 75-80% in chest to neck deep water.  Viscosity of the water is needed for resistance of strengthening. Water current perturbations provides challenge to standing  balance requiring increased core activation.     PATIENT EDUCATION:  Education details: aquatic therapy intro Person educated: Patient Education method: Explanation Education comprehension: verbalized understanding  HOME EXERCISE PROGRAM: tba  ASSESSMENT:  CLINICAL IMPRESSION: Pt arrives very early for appointment.  By time session begun she appeared tired/fatigued.  Pt reports increase in pain last few days. Needed to medicate which she does not do often. Pain reports are low at the time of session. She tolerates session well as far as no increase in pain.  She is able to complete all exercises but session is decreased by time to allow for safe ability to return home.  Pain sensitivity slightly higher today after sitting for extended time at show yesterday. We focused on gentle LB and hip ROM and stretching for reduction in symptoms.  Decompression position with LE movement (cycling, add/abd) as well as L stretch feels good with resulting decline in pain.  She has met multiple Stgs with excellent toleration to aquatic sessions and walking using cane to and  from setting indep without increase in pain or excessive fatigue.  She has not yet gained pool access but intends to by DC. Goals ongoing   Initial Impression Patient is a 68 y.o. f who was seen today for physical therapy evaluation and treatment for LBP. She has a long hx of chronic back pain dating back into the 1970's.  She reports multiple episodes of PT throughout the years which have helped to ease her pain.  She is new in town in past year and just getting settled in new home.  Pain exacerbation due some to unpacking.  She does have an aide 11 hours a week to assist with ADL's and household chores.  She reports limitations where any static standing or bending/twisting is involved.  Relies on aide to help her with bathing.  She has had aquatic therapy in past.  Does not yet have access to a pool.  She is given list of area pools and is encouraged to check them out.  Her pain sensitivity is high today and she does not tolerate much objective or functional testing.  Will plan to test as pt can better tolerate. She is significantly limited in all areas and will benefit from skilled PT intervention to improve deficits, decrease and manage pain, as well as improve safety and QOL.  OBJECTIVE IMPAIRMENTS: Abnormal gait, decreased activity tolerance, decreased balance, decreased endurance, decreased knowledge of condition, decreased knowledge of use of DME, decreased mobility, difficulty walking, decreased ROM, decreased strength, impaired perceived functional ability, impaired flexibility, postural dysfunction, obesity, and pain.   ACTIVITY LIMITATIONS: carrying, lifting, bending, sitting, standing, squatting, sleeping, stairs, transfers, bed mobility, dressing, hygiene/grooming, and locomotion level  PARTICIPATION LIMITATIONS: meal prep, cleaning, laundry, shopping, community activity, and yard work  PERSONAL FACTORS: Past/current experiences, Time since onset of injury/illness/exacerbation, and 1-2  comorbidities: see problem list  are also affecting patient's functional outcome.   REHAB POTENTIAL: Good  CLINICAL DECISION MAKING: Evolving/moderate complexity  EVALUATION COMPLEXITY: Moderate   GOALS: Goals reviewed with patient? Yes  SHORT TERM GOALS: Target date: 06/29/23  Pt will tolerate full aquatic sessions consistently without increase in pain and with improving function to demonstrate good toleration and effectiveness of intervention.  Baseline: Goal status: Met 07/05/23  2.  Pt will amb to and from setting consistently using ad along with completing entire aquatics session without excessive fatigue or pain Baseline: Met 07/05/23  3.  Pt will complete tandem and SLS x >15s  in 3.6 ft with ue support as needed Baseline:  Goal status: INITIAL  4.  Pt to be indep with initial aquatic HEP and gain access to personal pool for completion Baseline: pt given area pool handout Goal status: In progress 07/05/23   LONG TERM GOALS: Target date: 08/10/23  Pt to improve Foto core by 10% to demonstrate improved perceived functional ability Baseline: 24% Goal status: INITIAL  2.  Pt will improve LE strength to at least 4/5 to demonstrate improved toleration to testing and increase safety with function Baseline:  Goal status: INITIAL  3.  Pt will stand NBOS and semi tandem x 20s to demonstrate improve balance Baseline:  Goal status: INITIAL  4.  Pt will report decrease in LBP with amb by 3-4 NPRS to demonstrated improved ability to tolerate ADL's. Baseline:  Goal status: INITIAL  5.  Pt will improve on 3 MWT to 232ft demonstrating improved toleration to amb in home environment Baseline:  Goal status: INITIAL  6. Pt will improve on Tug test to <or= 18s to demonstrate improvement in lower extremity function, mobility and decreased fall risk. Baseline: 25.85 Goal status: INITIAL  PLAN:  PT FREQUENCY: 2x/week  PT DURATION: 10 weeks  PLANNED INTERVENTIONS: 97164- PT  Re-evaluation, 97110-Therapeutic exercises, 97530- Therapeutic activity, 97112- Neuromuscular re-education, 97535- Self Care, 16109- Manual therapy, [phone]- Gait training, [phone]- Orthotic Fit/training, [phone]- Aquatic Therapy, 97014- Electrical stimulation (unattended), [phone]- Ionotophoresis 4mg /ml Dexamethasone, Patient/Family education, Balance training, Stair training, Taping, Dry Needling, Joint mobilization, DME instructions, Cryotherapy, and Moist heat.  PLAN FOR NEXT SESSION: Aquatics only: pain management/relief, general strengthening, stretching, gait and balance retraining, assist with gaining access to pool   [place] Tomma Lightning) Dylyn Mclaren MPT 07/12/23 3:25 PM [facility] [address] [place], [place], [address] Phone: [phone]   Fax:  [phone]

## 2023-07-17 ENCOUNTER — Ambulatory Visit (HOSPITAL_BASED_OUTPATIENT_CLINIC_OR_DEPARTMENT_OTHER): Payer: Medicare Other | Admitting: Physical Therapy

## 2023-07-18 ENCOUNTER — Telehealth (HOSPITAL_COMMUNITY): Payer: Self-pay | Admitting: *Deleted

## 2023-07-18 NOTE — Telephone Encounter (Signed)
Reaching out to patient to offer assistance regarding upcoming cardiac imaging study; pt verbalizes understanding of appt date/time, parking situation and where to check in, pre-test NPO status and medications ordered, and verified current allergies; name and call back number provided for further questions should they arise Brittany Frame RN Navigator Cardiac Imaging [facility] [phone] office [phone] cell

## 2023-07-19 ENCOUNTER — Ambulatory Visit (HOSPITAL_BASED_OUTPATIENT_CLINIC_OR_DEPARTMENT_OTHER): Payer: Medicare Other | Admitting: Physical Therapy

## 2023-07-19 ENCOUNTER — Ambulatory Visit: Payer: Medicare Other | Admitting: Professional Counselor

## 2023-07-19 NOTE — Progress Notes (Signed)
Pt late cancellation due to medical concern.  Brittany [name], [facility]

## 2023-07-20 ENCOUNTER — Ambulatory Visit (HOSPITAL_COMMUNITY)
Admission: RE | Admit: 2023-07-20 | Discharge: 2023-07-20 | Disposition: A | Discharge status: Home / Self Care | Payer: Medicare Other | Source: Ambulatory Visit | Attending: Cardiology | Admitting: Cardiology

## 2023-07-20 DIAGNOSIS — E7849 Other hyperlipidemia: Secondary | ICD-10-CM | POA: Diagnosis present

## 2023-07-20 DIAGNOSIS — R072 Precordial pain: Secondary | ICD-10-CM | POA: Insufficient documentation

## 2023-07-20 DIAGNOSIS — G4733 Obstructive sleep apnea (adult) (pediatric): Secondary | ICD-10-CM | POA: Diagnosis present

## 2023-07-20 MED ORDER — IOHEXOL 350 MG/ML SOLN
95.0000 mL | Freq: Once | INTRAVENOUS | Status: AC | PRN
  Administered 2023-07-20: 95 mL via INTRAVENOUS

## 2023-07-20 MED ORDER — NITROGLYCERIN 0.4 MG SL SUBL
SUBLINGUAL_TABLET | SUBLINGUAL | Status: AC
  Filled 2023-07-20: qty 2

## 2023-07-20 MED ORDER — NITROGLYCERIN 0.4 MG SL SUBL
0.8000 mg | SUBLINGUAL_TABLET | Freq: Once | SUBLINGUAL | Status: AC
  Administered 2023-07-20: 0.8 mg via SUBLINGUAL

## 2023-07-23 ENCOUNTER — Ambulatory Visit: Payer: Medicare Other | Admitting: Physician Assistant

## 2023-07-24 ENCOUNTER — Ambulatory Visit (INDEPENDENT_AMBULATORY_CARE_PROVIDER_SITE_OTHER): Payer: Medicare Other | Admitting: Professional Counselor

## 2023-07-24 ENCOUNTER — Encounter: Payer: Self-pay | Admitting: Professional Counselor

## 2023-07-24 ENCOUNTER — Ambulatory Visit (HOSPITAL_BASED_OUTPATIENT_CLINIC_OR_DEPARTMENT_OTHER): Payer: Medicare Other | Admitting: Physical Therapy

## 2023-07-24 DIAGNOSIS — F319 Bipolar disorder, unspecified: Secondary | ICD-10-CM | POA: Diagnosis not present

## 2023-07-24 DIAGNOSIS — F411 Generalized anxiety disorder: Secondary | ICD-10-CM

## 2023-07-24 LAB — CALCIUM: Calcium: 10.6 mg/dL — ABNORMAL HIGH (ref 8.7–10.3)

## 2023-07-24 NOTE — Progress Notes (Signed)
      [facility] Counselor/Therapist Progress Note  Patient ID: Brittany [name], MRN: [id number],    Date: 2.4.2025  Time Spent: 2:31 PM to 3:18 PM  Treatment Type: Individual Therapy  Reported Symptoms: Sadness, low mood, worries, preoccupying thoughts, restlessness, irritability  Mental Status Exam:  Appearance:   Casual     Behavior:  Appropriate and Sharing  Motor:  Normal  Speech/Language:   Clear and Coherent and Normal Rate  Affect:  Appropriate and Congruent  Mood:  normal  Thought process:  normal  Thought content:    WNL  Sensory/Perceptual disturbances:    WNL  Orientation:  oriented to person, place, time/date, and situation  Attention:  Good  Concentration:  Good  Memory:  WNL  Fund of knowledge:   Good  Insight:    Good  Judgment:   Good  Impulse Control:  Good   Risk Assessment: Danger to Self:  No Self-injurious Behavior: No Danger to Others: No Duty to Warn:no Physical Aggression / Violence:No  Access to Firearms a concern: No  Gang Involvement:No   Subjective: Patient presented to session to address concerns of bipolar and anxiety.  She reported to be very depressed lately and using her SAD lamp daily to help mitigate symptoms.  She reported focusing on her spirituality by virtue of study, witness, service attendance, and for her spiritual resourcing to be helpful with mood enhancement.  She processed frustration with changes at her church and voiced working to lean into acceptance and focus on herself versus externals.  Counselor actively listened, affirmed patient feelings and experience, and helped to reinforce patient inner strengths and DBT coping skills of mindfulness and acceptance.  Patient processed experience of health concerns including sciatica, and counselor and patient discussed patient approach to medical needs.  Interventions: Solution-Oriented/Positive Psychology, Humanistic/Existential, and Insight-Oriented, DBT  Diagnosis:   ICD-10-CM    1. Bipolar I disorder with seasonal pattern (HCC)  F31.9     2. Generalized anxiety disorder  F41.1       Plan: Patient is scheduled for a follow-up; continue process work and developing coping skills.  Patient personal goal between sessions to continue to resource spirituality, and practice mindfulness and acceptance coping skills.  Progress note was dictated with Dragon and reviewed for accuracy.  Almarie ONEIDA Sprang, [facility]

## 2023-07-26 ENCOUNTER — Ambulatory Visit (HOSPITAL_BASED_OUTPATIENT_CLINIC_OR_DEPARTMENT_OTHER): Payer: Medicare Other | Attending: Geriatric Medicine | Admitting: Physical Therapy

## 2023-07-26 ENCOUNTER — Encounter: Payer: Self-pay | Admitting: Podiatry

## 2023-07-26 ENCOUNTER — Ambulatory Visit (INDEPENDENT_AMBULATORY_CARE_PROVIDER_SITE_OTHER): Payer: Medicare Other

## 2023-07-26 ENCOUNTER — Encounter (HOSPITAL_BASED_OUTPATIENT_CLINIC_OR_DEPARTMENT_OTHER): Payer: Self-pay | Admitting: Physical Therapy

## 2023-07-26 ENCOUNTER — Ambulatory Visit (INDEPENDENT_AMBULATORY_CARE_PROVIDER_SITE_OTHER): Payer: Medicare Other | Admitting: Podiatry

## 2023-07-26 VITALS — Ht 62.0 in | Wt 183.0 lb

## 2023-07-26 DIAGNOSIS — M5459 Other low back pain: Secondary | ICD-10-CM | POA: Diagnosis present

## 2023-07-26 DIAGNOSIS — M6281 Muscle weakness (generalized): Secondary | ICD-10-CM | POA: Insufficient documentation

## 2023-07-26 DIAGNOSIS — M79671 Pain in right foot: Secondary | ICD-10-CM | POA: Diagnosis not present

## 2023-07-26 DIAGNOSIS — R293 Abnormal posture: Secondary | ICD-10-CM | POA: Insufficient documentation

## 2023-07-26 DIAGNOSIS — M79672 Pain in left foot: Secondary | ICD-10-CM | POA: Diagnosis not present

## 2023-07-26 DIAGNOSIS — M722 Plantar fascial fibromatosis: Secondary | ICD-10-CM

## 2023-07-26 DIAGNOSIS — R2689 Other abnormalities of gait and mobility: Secondary | ICD-10-CM | POA: Diagnosis present

## 2023-07-26 MED ORDER — TRIAMCINOLONE ACETONIDE 10 MG/ML IJ SUSP
10.0000 mg | Freq: Once | INTRAMUSCULAR | Status: AC
  Administered 2023-07-26: 10 mg via INTRA_ARTICULAR

## 2023-07-26 NOTE — Therapy (Signed)
 OUTPATIENT PHYSICAL THERAPY THORACOLUMBAR TREATMENT   Patient Name: Brittany [name] MRN: [id number] DOB:01-19-1956, 68 y.o., female Today's Date: 07/26/2023  END OF SESSION:  PT End of Session - 07/26/23 1459     Visit Number 8    Number of Visits 16    Date for PT Re-Evaluation 08/10/23    Authorization Type MCR    Progress Note Due on Visit 10    Activity Tolerance Patient tolerated treatment well    Behavior During Therapy [place] for tasks assessed/performed             Past Medical History:  Diagnosis Date   ACNE NEC    ALLERGIC RHINITIS    ALOPECIA    Anal fissure    Anxiety    Panic Attack.  None in a long time   Arthritis    Asymptomatic microscopic hematuria 12/05/2022   Bowel obstruction (HCC)    Breast mass 12/05/2022   Sep 04, 2008 Entered By: MEMORY GULL B Comment: benign on bx   Chronic headaches    migraine- none in years   Constipation    drinks tea with senna   DEPRESSION    Depression    DISORDER, BIPOLAR NEC    Family history of breast cancer    Family history of pancreatic cancer    GERD (gastroesophageal reflux disease)    HEMORRHOIDS-INTERNAL    History of stress test    ETT 10/17: Ex 7'; no chest pain, no ST changes, [facility] Treadmill Score 7   HYPERLIPIDEMIA    OBSTRUCTIVE SLEEP APNEA    Seasonal allergies    Past Surgical History:  Procedure Laterality Date   BREAST LUMPECTOMY Right last done 2005   x 2   CHOLECYSTECTOMY N/A 03/07/2014   Procedure: LAPAROSCOPIC CHOLECYSTECTOMY WITH INTRAOPERATIVE CHOLANGIOGRAM;  Surgeon: Elon Pacini, MD;  Location: [facility] OR;  Service: General;  Laterality: N/A;   EUS N/A 01/27/2014   Procedure: ESOPHAGEAL ENDOSCOPIC ULTRASOUND (EUS) RADIAL;  Surgeon: Elsie Cree, MD;  Location: [facility] END[facility];  Service: Endoscopy;  Laterality: N/A;   EYE SURGERY  08/2021   Cataract surgey   HAND SURGERY Bilateral    for arthritis  left 02/02/14- right approx 2014   KNEE SURGERY Bilateral    right x 2 left x 1.   arthroscopy   RADICAL HYSTERECTOMY     thumb and pinkie finger surgery Right 06/19/2012   TONSILLECTOMY     Patient Active Problem List   Diagnosis Date Noted   Chronic pain syndrome 12/05/2022   Urinary incontinence 12/05/2022   Plantar fasciitis 12/05/2022   Insomnia 12/05/2022   Dry skin 12/05/2022   Dizziness and giddiness 12/05/2022   Cataract 12/05/2022   Anxiety 12/05/2022   Chloasma 12/05/2022   Cyst of kidney, acquired 12/05/2022   Cyst of thyroid  12/05/2022   Chronic sinusitis, unspecified 09/06/2022   Spinal stenosis of lumbar region 01/10/2022   Ex-cigarette smoker 08/23/2021   Tobacco dependence in remission 06/09/2020   Spondylolysis 05/27/2018   Unspecified inflammatory spondylopathy, lumbar region (HCC) 05/27/2018   Lumbar spondylosis 04/15/2018   Other hyperlipidemia 06/21/2017   Acquired absence of other genital organ(s) 06/05/2017   Headache 01/26/2017   Neck pain 11/07/2016   Radon exposure 04/03/2016   Allergic rhinitis 02/15/2016   Genetic testing 09/09/2015   Primary osteoarthritis of right knee 09/09/2015   Family history of breast cancer    Family history of pancreatic cancer    Chronic bilateral low back pain without sciatica 04/20/2015   Trochanteric  bursitis of left hip 04/20/2015   Cervical myofascial pain syndrome 04/20/2015   Medicare annual wellness visit, subsequent 02/10/2015   Gallstones 02/16/2014   Hyperparathyroidism (HCC) 01/29/2013   Chronic back pain 10/21/2012   ALOPECIA 03/05/2009   Obstructive sleep apnea 10/06/2008   HEMORRHOIDS-INTERNAL 04/28/2008   History of colonic polyps 04/24/2008   Hyperlipidemia LDL goal <100 03/20/2007   DISORDER, BIPOLAR NEC 03/20/2007   Depression 03/20/2007    PCP: Boby Mackintosh NP  REFERRING PROVIDER: Donaciano Sprang MD  REFERRING DIAG: M54.51 (ICD-10-CM) - Vertebrogenic low back pain   Rationale for Evaluation and Treatment: Rehabilitation  THERAPY DIAG:  Other low back pain  Muscle  weakness (generalized)  Abnormal posture  Other abnormalities of gait and mobility  ONSET DATE: >40 yrs  SUBJECTIVE:                                                                                                                                                                                           SUBJECTIVE STATEMENT: My sciatica has been bad today  POOL ACCESS:  Pt interested in joining [employer], but has not yet.   Initial subjective I am trying therapy to avoid back surgery.  [facility]  Had massages before in last home state and it helped for a [place] while. Has aide 11 hours a week. She helps me bath. Bending putting dishes in dishwasher, getting laundry out of dryer. New In town in past year.   PERTINENT HISTORY:  KNEE oa receiving gel shots Chronic pain syndrome Bi-polar  PAIN:  Are you having pain? Yes: NPRS scale: 7/10; Pain location: LB with radiation into left foot sometimes right Pain description: piercing down left leg > right Aggravating factors: bending, twisting, sitting upright, cold, standing x 5 mins; walking 5-10 min Relieving factors: meds  PRECAUTIONS: None  RED FLAGS: None   WEIGHT BEARING RESTRICTIONS: No  FALLS:  Has patient fallen in last 6 months? No  LIVING ENVIRONMENT: Lives with: lives alone Lives in: House/apartment Stairs: No Has following equipment at home: Single point cane, Environmental Consultant - 4 wheeled, Marine Scientist  OCCUPATION: disabled  PLOF: Needs assistance with ADLs and Needs assistance with homemaking  PATIENT GOALS: decrease pain; walk better  NEXT MD VISIT: 4-5 weeks  OBJECTIVE:  Note: Objective measures were completed at Evaluation unless otherwise noted.  DIAGNOSTIC FINDINGS:  MRI of the lumbar spine as well as x-rays lumbar spine region. She does have a grade 1 spondylolisthesis on plain films on the lateral view. Significant spondylitic disease at L4 5 greater than  L5 1   PATIENT SURVEYS:  FOTO Primary measure 24%; risk adjusted 42%; goal 40%  COGNITION: Overall cognitive status: Within functional limits for tasks assessed     SENSATION: WFL  MUSCLE LENGTH: Hamstrings: bilaterally tight visualized in sitting   POSTURE: rounded shoulders, forward head, and decreased lumbar lordosis  PALPATION: TTP  LUMBAR ROM:  High pain sensitivity limiting lumbar ROM AROM eval  Flexion 100% limited  Extension 100% limited  Right lateral flexion 100% limited  Left lateral flexion 90% limited  Right rotation   Left rotation    (Blank rows = not tested)  LOWER EXTREMITY ROM:      Not tested due to pain sensitivity  LOWER EXTREMITY MMT:     Not tested due to pain sensitivity. At least 3/5 in hip and knee   LUMBAR SPECIAL TESTS:  Not tolerated  FUNCTIONAL TESTS:  5 times sit to stand: TBA Timed up and go (TUG): 25.85 3 stage balance: NBOS needs assist gaining position. 3 MWT:171ft 1 standing rest period, no ad  GAIT: Distance walked: 123 Assistive device utilized: None pt encouraged to use rollator when coming to sessions for toleration to exercise as well as safety.  She VU Level of assistance: SBA Comments: Increased time spent in stance bilaterally, lateral displacement  TODAY'S TREATMENT:                                                                                                                              Pt seen for aquatic therapy today.  Treatment took place in water 3.5-4.75 ft in depth at the [facility] pool. Temp of water was 91.  Pt entered/exited the pool via stairs  with hand rail.   *unsupported, walking forward/ backward/side stepping - multiple laps  * side stepping x 4 lap; with addct/ abdct with rainbow hand floats x 4 laps * holding wall: L stretch; abd stretch; hamstring stretch *TrA full solid noodle: wide stance x10  then staggered stance x 10 * farmer carry with bilat then unilateral rainbow hand  floats under water at side with marching forward/ backward *[place] x 10.  VC and demonstration for execution *L stretch between exercises for stretching as it relieves  *decompression with noodle wrapped posteriorly (increases sciatic pain) then anteriorly across chest   - cycling; hip abdct/ addct, clams; hip flex/ext  Pt requires the buoyancy and hydrostatic pressure of water for support, and to offload joints by unweighting joint load by at least 50 % in navel deep water and by at least 75-80% in chest to neck deep water.  Viscosity of the water is needed for resistance of strengthening. Water current perturbations provides challenge to standing balance requiring increased core activation.     PATIENT EDUCATION:  Education details: aquatic therapy intro Person educated: Patient Education method: Explanation Education comprehension: verbalized understanding  HOME EXERCISE PROGRAM: tba  ASSESSMENT:  CLINICAL IMPRESSION: Pain high today at start initiation of session (arrives late so she is rushing).  Discussed overall response to aquatic intervention.  States she has found she feels best when taking ms relaxer's and meloxicam  at night before bed which she has tried to avoid in past due to her adverse reactions to meds generally.  She demonstrates improved toleration to activity with decreased pain sensitivity particularly with cycling/decompression today (1st session after taking meds before bed previous night). Reduction after session today 2/10. She will make appt to see Dr Burnetta as she feels her overall pain has not reduced as he and she were hoping with the aquatic intervention. Plan address goals next session. Goals ongoing.   Initial Impression Patient is a 68 y.o. f who was seen today for physical therapy evaluation and treatment for LBP. She has a long hx of chronic back pain dating back into the 1970's.  She reports multiple episodes of PT throughout the years which have  helped to ease her pain.  She is new in town in past year and just getting settled in new home.  Pain exacerbation due some to unpacking.  She does have an aide 11 hours a week to assist with ADL's and household chores.  She reports limitations where any static standing or bending/twisting is involved.  Relies on aide to help her with bathing.  She has had aquatic therapy in past.  Does not yet have access to a pool.  She is given list of area pools and is encouraged to check them out.  Her pain sensitivity is high today and she does not tolerate much objective or functional testing.  Will plan to test as pt can better tolerate. She is significantly limited in all areas and will benefit from skilled PT intervention to improve deficits, decrease and manage pain, as well as improve safety and QOL.  OBJECTIVE IMPAIRMENTS: Abnormal gait, decreased activity tolerance, decreased balance, decreased endurance, decreased knowledge of condition, decreased knowledge of use of DME, decreased mobility, difficulty walking, decreased ROM, decreased strength, impaired perceived functional ability, impaired flexibility, postural dysfunction, obesity, and pain.   ACTIVITY LIMITATIONS: carrying, lifting, bending, sitting, standing, squatting, sleeping, stairs, transfers, bed mobility, dressing, hygiene/grooming, and locomotion level  PARTICIPATION LIMITATIONS: meal prep, cleaning, laundry, shopping, community activity, and yard work  PERSONAL FACTORS: Past/current experiences, Time since onset of injury/illness/exacerbation, and 1-2 comorbidities: see problem list  are also affecting patient's functional outcome.   REHAB POTENTIAL: Good  CLINICAL DECISION MAKING: Evolving/moderate complexity  EVALUATION COMPLEXITY: Moderate   GOALS: Goals reviewed with patient? Yes  SHORT TERM GOALS: Target date: 06/29/23  Pt will tolerate full aquatic sessions consistently without increase in pain and with improving function to  demonstrate good toleration and effectiveness of intervention.  Baseline: Goal status: Met 07/05/23  2.  Pt will amb to and from setting consistently using ad along with completing entire aquatics session without excessive fatigue or pain Baseline: Met 07/05/23  3.  Pt will complete tandem and SLS x >15s in 3.6 ft with ue support as needed (submerged) Baseline:  Goal status: INITIAL  4.  Pt to be indep with initial aquatic HEP and gain access to personal pool for completion Baseline: pt given area pool handout Goal status: In progress 07/05/23   LONG TERM GOALS: Target date: 08/10/23  Pt to improve Foto core by 10% to demonstrate improved perceived functional ability Baseline: 24% Goal status: INITIAL  2.  Pt will improve LE strength to at least 4/5 to demonstrate improved toleration to testing and increase safety with function Baseline:  Goal status: INITIAL  3.  Pt will stand NBOS and semi tandem x 20s to demonstrate improve balance Baseline:  Goal status: INITIAL  4.  Pt will report decrease in LBP with amb by 3-4 NPRS to demonstrated improved ability to tolerate ADL's. Baseline:  Goal status: INITIAL  5.  Pt will improve on 3 MWT to 277ft demonstrating improved toleration to amb in home environment Baseline:  Goal status: INITIAL  6. Pt will improve on Tug test to <or= 18s to demonstrate improvement in lower extremity function, mobility and decreased fall risk. Baseline: 25.85 Goal status: INITIAL  PLAN:  PT FREQUENCY: 2x/week  PT DURATION: 10 weeks  PLANNED INTERVENTIONS: 97164- PT Re-evaluation, 97110-Therapeutic exercises, 97530- Therapeutic activity, 97112- Neuromuscular re-education, 97535- Self Care, 02859- Manual therapy, [phone]- Gait training, [phone]- Orthotic Fit/training, [phone]- Aquatic Therapy, 97014- Electrical stimulation (unattended), [phone]- Ionotophoresis 4mg /ml Dexamethasone , Patient/Family education, Balance training, Stair training, Taping, Dry  Needling, Joint mobilization, DME instructions, Cryotherapy, and Moist heat.  PLAN FOR NEXT SESSION: Aquatics only: pain management/relief, general strengthening, stretching, gait and balance retraining, assist with gaining access to pool   [place] Kem) Uzziel Russey MPT 07/26/23 3:02 PM [facility] [address] [place], [place], [address] Phone: [phone]   Fax:  [phone]

## 2023-07-26 NOTE — Progress Notes (Signed)
 Subjective:   Patient ID: Brittany [name], female   DOB: 68 y.o.   MRN: [id number]   HPI Patient states she gets a lot of pain in her arch and heel which seems worse when she gets up in the morning after periods of sitting.  This has been an ongoing issue and she just returned to town after being gone for 7 years and was not able to see anyone.  Patient tries to be active does not smoke   Review of Systems  All other systems reviewed and are negative.       Objective:  Physical Exam Vitals and nursing note reviewed.  Constitutional:      Appearance: She is well-developed.  Pulmonary:     Effort: Pulmonary effort is normal.  Musculoskeletal:        General: Normal range of motion.  Skin:    General: Skin is warm.  Neurological:     Mental Status: She is alert.     Neurovascular status intact muscle strength was found to be adequate range of motion adequate.  Patient is noted to have discomfort in the plantar heel bilateral moderate in its intensity fluid buildup moderate depression of the arch noted.  Patient has good digital perfusion well-oriented x 3     Assessment:  Acute plantar fasciitis bilateral inflammation fluid on the medial border with moderate depression of the arch     Plan:  Age P reviewed condition may need night splints long-term explained stretching exercises and dispensed along with shoe gear modifications and taking arthritic Tylenol  at night at times.  Also recommended soaks did do sterile prep injected the fascia bilateral 3 mg Kenalog  5 mg Xylocaine   X-rays indicate small spur no indication stress fracture arthritis

## 2023-07-26 NOTE — Patient Instructions (Signed)

## 2023-07-31 ENCOUNTER — Encounter (HOSPITAL_BASED_OUTPATIENT_CLINIC_OR_DEPARTMENT_OTHER): Payer: Self-pay | Admitting: Physical Therapy

## 2023-07-31 ENCOUNTER — Ambulatory Visit (HOSPITAL_BASED_OUTPATIENT_CLINIC_OR_DEPARTMENT_OTHER): Payer: Medicare Other | Admitting: Physical Therapy

## 2023-07-31 DIAGNOSIS — M5459 Other low back pain: Secondary | ICD-10-CM | POA: Diagnosis not present

## 2023-07-31 DIAGNOSIS — R293 Abnormal posture: Secondary | ICD-10-CM

## 2023-07-31 DIAGNOSIS — M6281 Muscle weakness (generalized): Secondary | ICD-10-CM

## 2023-07-31 NOTE — Therapy (Signed)
OUTPATIENT PHYSICAL THERAPY THORACOLUMBAR TREATMENT   Patient Name: Brittany [name] MRN: [id number] DOB:1956-06-06, 68 y.o., female Today's Date: 07/31/2023  END OF SESSION:  PT End of Session - 07/31/23 1449     Visit Number 9    Number of Visits 16    Date for PT Re-Evaluation 08/10/23    Authorization Type MCR    Progress Note Due on Visit 10    PT Start Time 1445    PT Stop Time 1525    PT Time Calculation (min) 40 min    Activity Tolerance Patient tolerated treatment well             Past Medical History:  Diagnosis Date   ACNE NEC    ALLERGIC RHINITIS    ALOPECIA    Anal fissure    Anxiety    Panic Attack.  None in a long time   Arthritis    Asymptomatic microscopic hematuria 12/05/2022   Bowel obstruction (HCC)    Breast mass 12/05/2022   Sep 04, 2008 Entered By: Bennie Pierini B Comment: benign on bx   Chronic headaches    migraine- none in years   Constipation    drinks tea with senna   DEPRESSION    Depression    DISORDER, BIPOLAR NEC    Family history of breast cancer    Family history of pancreatic cancer    GERD (gastroesophageal reflux disease)    HEMORRHOIDS-INTERNAL    History of stress test    ETT 10/17: Ex 7'; no chest pain, no ST changes, [facility] Treadmill Score 7   HYPERLIPIDEMIA    OBSTRUCTIVE SLEEP APNEA    Seasonal allergies    Past Surgical History:  Procedure Laterality Date   BREAST LUMPECTOMY Right last done 2005   x 2   CHOLECYSTECTOMY N/A 03/07/2014   Procedure: LAPAROSCOPIC CHOLECYSTECTOMY WITH INTRAOPERATIVE CHOLANGIOGRAM;  Surgeon: Claud Kelp, MD;  Location: [facility] OR;  Service: General;  Laterality: N/A;   EUS N/A 01/27/2014   Procedure: ESOPHAGEAL ENDOSCOPIC ULTRASOUND (EUS) RADIAL;  Surgeon: Willis Modena, MD;  Location: [facility];  Service: Endoscopy;  Laterality: N/A;   EYE SURGERY  08/2021   Cataract surgey   HAND SURGERY Bilateral    for arthritis  left 02/02/14- right approx 2014   KNEE SURGERY Bilateral     right x 2 left x 1.  arthroscopy   RADICAL HYSTERECTOMY     thumb and pinkie finger surgery Right 06/19/2012   TONSILLECTOMY     Patient Active Problem List   Diagnosis Date Noted   Chronic pain syndrome 12/05/2022   Urinary incontinence 12/05/2022   Plantar fasciitis 12/05/2022   Insomnia 12/05/2022   Dry skin 12/05/2022   Dizziness and giddiness 12/05/2022   Cataract 12/05/2022   Anxiety 12/05/2022   Chloasma 12/05/2022   Cyst of kidney, acquired 12/05/2022   Cyst of thyroid 12/05/2022   Chronic sinusitis, unspecified 09/06/2022   Spinal stenosis of lumbar region 01/10/2022   Ex-cigarette smoker 08/23/2021   Tobacco dependence in remission 06/09/2020   Spondylolysis 05/27/2018   Unspecified inflammatory spondylopathy, lumbar region (HCC) 05/27/2018   Lumbar spondylosis 04/15/2018   Other hyperlipidemia 06/21/2017   Acquired absence of other genital organ(s) 06/05/2017   Headache 01/26/2017   Neck pain 11/07/2016   Radon exposure 04/03/2016   Allergic rhinitis 02/15/2016   Genetic testing 09/09/2015   Primary osteoarthritis of right knee 09/09/2015   Family history of breast cancer    Family history of pancreatic cancer  Chronic bilateral low back pain without sciatica 04/20/2015   Trochanteric bursitis of left hip 04/20/2015   Cervical myofascial pain syndrome 04/20/2015   Medicare annual wellness visit, subsequent 02/10/2015   Gallstones 02/16/2014   Hyperparathyroidism (HCC) 01/29/2013   Chronic back pain 10/21/2012   ALOPECIA 03/05/2009   Obstructive sleep apnea 10/06/2008   HEMORRHOIDS-INTERNAL 04/28/2008   History of colonic polyps 04/24/2008   Hyperlipidemia LDL goal <100 03/20/2007   DISORDER, BIPOLAR NEC 03/20/2007   Depression 03/20/2007    PCP: Hetty Blend NP  REFERRING PROVIDER: Venita Lick MD  REFERRING DIAG: M54.51 (ICD-10-CM) - Vertebrogenic low back pain   Rationale for Evaluation and Treatment: Rehabilitation  THERAPY DIAG:  Other  low back pain  Muscle weakness (generalized)  Abnormal posture  ONSET DATE: >40 yrs  SUBJECTIVE:                                                                                                                                                                                           SUBJECTIVE STATEMENT: Pt reports that she had injections in both feet last week for plantar fascitis. "Last session was the first time I have left with less pain, 2/10".     POOL ACCESS:  Pt interested in joining [place], but has not yet.   Initial subjective I am trying therapy to avoid back surgery.  [facility]  Had massages before in last home state and it helped for a [place] while. Has aide 11 hours a week. She helps me bath. Bending putting dishes in dishwasher, getting laundry out of dryer. New In town in past year.   PERTINENT HISTORY:  KNEE oa receiving gel shots Chronic pain syndrome Bi-polar  PAIN:  Are you having pain? Yes: NPRS scale: 5/10; Pain location: LB with radiation into left ankle  Pain description: piercing  Aggravating factors: bending, twisting, sitting upright, cold, standing x 5 mins; walking 5-10 min Relieving factors: meds  PRECAUTIONS: None  RED FLAGS: None   WEIGHT BEARING RESTRICTIONS: No  FALLS:  Has patient fallen in last 6 months? No  LIVING ENVIRONMENT: Lives with: lives alone Lives in: House/apartment Stairs: No Has following equipment at home: Single point cane, Environmental consultant - 4 wheeled, Marine scientist  OCCUPATION: disabled  PLOF: Needs assistance with ADLs and Needs assistance with homemaking  PATIENT GOALS: decrease pain; walk better  NEXT MD VISIT: 4-5 weeks  OBJECTIVE:  Note: Objective measures were completed at Evaluation unless otherwise noted.  DIAGNOSTIC FINDINGS:  MRI of the lumbar spine as well as x-rays lumbar spine region. She does have a grade 1  spondylolisthesis on plain films on the  lateral view. Significant spondylitic disease at L4 5 greater than L5 1   PATIENT SURVEYS:  FOTO Primary measure 24%; risk adjusted 42%; goal 40%  COGNITION: Overall cognitive status: Within functional limits for tasks assessed     SENSATION: WFL  MUSCLE LENGTH: Hamstrings: bilaterally tight visualized in sitting   POSTURE: rounded shoulders, forward head, and decreased lumbar lordosis  PALPATION: TTP  LUMBAR ROM:  High pain sensitivity limiting lumbar ROM AROM eval  Flexion 100% limited  Extension 100% limited  Right lateral flexion 100% limited  Left lateral flexion 90% limited  Right rotation   Left rotation    (Blank rows = not tested)  LOWER EXTREMITY ROM:      Not tested due to pain sensitivity  LOWER EXTREMITY MMT:     Not tested due to pain sensitivity. At least 3/5 in hip and knee   LUMBAR SPECIAL TESTS:  Not tolerated  FUNCTIONAL TESTS:  5 times sit to stand: TBA Timed up and go (TUG): 25.85 3 stage balance: NBOS needs assist gaining position. 3 MWT:117ft 1 standing rest period, no ad  GAIT: Distance walked: 123 Assistive device utilized: None pt encouraged to use rollator when coming to sessions for toleration to exercise as well as safety.  She VU Level of assistance: SBA Comments: Increased time spent in stance bilaterally, lateral displacement  TODAY'S TREATMENT:                                                                                                                              Pt seen for aquatic therapy today.  Treatment took place in water 3.5-4.75 ft in depth at the [facility] pool. Temp of water was 91.  Pt entered/exited the pool via stairs  with hand rail.   *unsupported, walking forward/ backward/side stepping - multiple laps  * side stepping x 4 lap; with addct/ abdct with rainbow hand floats x 4 laps * farmer carry with bilat then unilateral rainbow hand floats under water at side with marching forward/ backward *  L stretch at wall x 30s * holding wall: L stretch;  *TrA full solid noodle: wide stance x10  then staggered stance x 10 * SLS in 84ft 6" x 15 sec each  * L stretch at wall * tandem stance in 7ft 6" x 15s each leg *decompression with noodle wrapped anteriorly across chest   - cycling; hip abdct/ addct, clams; hip flex/ext * 3 way LE stretch with ankle on hollow blue noodle and back against wall   Pt requires the buoyancy and hydrostatic pressure of water for support, and to offload joints by unweighting joint load by at least 50 % in navel deep water and by at least 75-80% in chest to neck deep water.  Viscosity of the water is needed for resistance of strengthening. Water current perturbations provides challenge to standing balance requiring increased core activation.  PATIENT EDUCATION:  Education details: aquatic therapy progressions/ modifications Person educated: Patient Education method: Explanation Education comprehension: verbalized understanding  HOME EXERCISE PROGRAM: tba  ASSESSMENT:  CLINICAL IMPRESSION: Pt able to sustain SLS and tandem stance in 36ft6" water unsupported without difficulty; has met STG3. Pt reported reduction of pain to 3/10 and reduction of radicular symptoms to upper L thigh.  Plan address goals next session, including TUG and FOTO for 10th visit note. Goals ongoing.   Initial Impression Patient is a 68 y.o. f who was seen today for physical therapy evaluation and treatment for LBP. She has a long hx of chronic back pain dating back into the 1970's.  She reports multiple episodes of PT throughout the years which have helped to ease her pain.  She is new in town in past year and just getting settled in new home.  Pain exacerbation due some to unpacking.  She does have an aide 11 hours a week to assist with ADL's and household chores.  She reports limitations where any static standing or bending/twisting is involved.  Relies on aide to help her with  bathing.  She has had aquatic therapy in past.  Does not yet have access to a pool.  She is given list of area pools and is encouraged to check them out.  Her pain sensitivity is high today and she does not tolerate much objective or functional testing.  Will plan to test as pt can better tolerate. She is significantly limited in all areas and will benefit from skilled PT intervention to improve deficits, decrease and manage pain, as well as improve safety and QOL.  OBJECTIVE IMPAIRMENTS: Abnormal gait, decreased activity tolerance, decreased balance, decreased endurance, decreased knowledge of condition, decreased knowledge of use of DME, decreased mobility, difficulty walking, decreased ROM, decreased strength, impaired perceived functional ability, impaired flexibility, postural dysfunction, obesity, and pain.   ACTIVITY LIMITATIONS: carrying, lifting, bending, sitting, standing, squatting, sleeping, stairs, transfers, bed mobility, dressing, hygiene/grooming, and locomotion level  PARTICIPATION LIMITATIONS: meal prep, cleaning, laundry, shopping, community activity, and yard work  PERSONAL FACTORS: Past/current experiences, Time since onset of injury/illness/exacerbation, and 1-2 comorbidities: see problem list  are also affecting patient's functional outcome.   REHAB POTENTIAL: Good  CLINICAL DECISION MAKING: Evolving/moderate complexity  EVALUATION COMPLEXITY: Moderate   GOALS: Goals reviewed with patient? Yes  SHORT TERM GOALS: Target date: 06/29/23  Pt will tolerate full aquatic sessions consistently without increase in pain and with improving function to demonstrate good toleration and effectiveness of intervention.  Baseline: Goal status: Met 07/05/23  2.  Pt will amb to and from setting consistently using ad along with completing entire aquatics session without excessive fatigue or pain Baseline: Met 07/05/23  3.  Pt will complete tandem and SLS x >15s in 3.6 ft with ue support  as needed (submerged) Baseline:  Goal status: MET 07/31/23  4.  Pt to be indep with initial aquatic HEP and gain access to personal pool for completion Baseline: pt given area pool handout Goal status: In progress 07/05/23   LONG TERM GOALS: Target date: 08/10/23  Pt to improve Foto core by 10% to demonstrate improved perceived functional ability Baseline: 24% Goal status: INITIAL  2.  Pt will improve LE strength to at least 4/5 to demonstrate improved toleration to testing and increase safety with function Baseline:  Goal status: INITIAL  3.  Pt will stand NBOS and semi tandem x 20s to demonstrate improve balance Baseline:  Goal status: INITIAL  4.  Pt will report decrease in LBP with amb by 3-4 NPRS to demonstrated improved ability to tolerate ADL's. Baseline:  Goal status: INITIAL  5.  Pt will improve on 3 MWT to 23ft demonstrating improved toleration to amb in home environment Baseline:  Goal status: INITIAL  6. Pt will improve on Tug test to <or= 18s to demonstrate improvement in lower extremity function, mobility and decreased fall risk. Baseline: 25.85 Goal status: INITIAL  PLAN:  PT FREQUENCY: 2x/week  PT DURATION: 10 weeks  PLANNED INTERVENTIONS: 97164- PT Re-evaluation, 97110-Therapeutic exercises, 97530- Therapeutic activity, 97112- Neuromuscular re-education, 97535- Self Care, 40981- Manual therapy, [phone]- Gait training, [phone]- Orthotic Fit/training, [phone]- Aquatic Therapy, 97014- Electrical stimulation (unattended), [phone]- Ionotophoresis 4mg /ml Dexamethasone, Patient/Family education, Balance training, Stair training, Taping, Dry Needling, Joint mobilization, DME instructions, Cryotherapy, and Moist heat.  PLAN FOR NEXT SESSION: Aquatics only: pain management/relief, general strengthening, stretching, gait and balance retraining, assist with gaining access to pool

## 2023-08-02 ENCOUNTER — Ambulatory Visit (HOSPITAL_BASED_OUTPATIENT_CLINIC_OR_DEPARTMENT_OTHER): Payer: Medicare Other | Admitting: Physical Therapy

## 2023-08-02 ENCOUNTER — Ambulatory Visit: Payer: Medicare Other | Admitting: Professional Counselor

## 2023-08-02 ENCOUNTER — Encounter (HOSPITAL_BASED_OUTPATIENT_CLINIC_OR_DEPARTMENT_OTHER): Payer: Self-pay | Admitting: Physical Therapy

## 2023-08-02 DIAGNOSIS — M6281 Muscle weakness (generalized): Secondary | ICD-10-CM

## 2023-08-02 DIAGNOSIS — M5459 Other low back pain: Secondary | ICD-10-CM

## 2023-08-02 DIAGNOSIS — R2689 Other abnormalities of gait and mobility: Secondary | ICD-10-CM

## 2023-08-02 DIAGNOSIS — R293 Abnormal posture: Secondary | ICD-10-CM

## 2023-08-02 NOTE — Therapy (Signed)
OUTPATIENT PHYSICAL THERAPY THORACOLUMBAR TREATMENT Progress Note Reporting Period 05/31/23 to 08/02/23  See note below for Objective Data and Assessment of Progress/Goals.      Patient Name: Brittany [name] MRN: [id number] DOB:08-Aug-1955, 68 y.o., female Today's Date: 08/02/2023  END OF SESSION:  PT End of Session - 08/02/23 1441     Visit Number 10    Number of Visits 16    Date for PT Re-Evaluation 09/07/23    Authorization Type MCR    Progress Note Due on Visit 10    PT Start Time 1447    PT Stop Time 1529    PT Time Calculation (min) 42 min    Activity Tolerance Patient tolerated treatment well    Behavior During Therapy WFL for tasks assessed/performed              Past Medical History:  Diagnosis Date   ACNE NEC    ALLERGIC RHINITIS    ALOPECIA    Anal fissure    Anxiety    Panic Attack.  None in a long time   Arthritis    Asymptomatic microscopic hematuria 12/05/2022   Bowel obstruction (HCC)    Breast mass 12/05/2022   Sep 04, 2008 Entered By: Bennie Pierini B Comment: benign on bx   Chronic headaches    migraine- none in years   Constipation    drinks tea with senna   DEPRESSION    Depression    DISORDER, BIPOLAR NEC    Family history of breast cancer    Family history of pancreatic cancer    GERD (gastroesophageal reflux disease)    HEMORRHOIDS-INTERNAL    History of stress test    ETT 10/17: Ex 7'; no chest pain, no ST changes, [facility] Treadmill Score 7   HYPERLIPIDEMIA    OBSTRUCTIVE SLEEP APNEA    Seasonal allergies    Past Surgical History:  Procedure Laterality Date   BREAST LUMPECTOMY Right last done 2005   x 2   CHOLECYSTECTOMY N/A 03/07/2014   Procedure: LAPAROSCOPIC CHOLECYSTECTOMY WITH INTRAOPERATIVE CHOLANGIOGRAM;  Surgeon: Claud Kelp, MD;  Location: [facility] OR;  Service: General;  Laterality: N/A;   EUS N/A 01/27/2014   Procedure: ESOPHAGEAL ENDOSCOPIC ULTRASOUND (EUS) RADIAL;  Surgeon: Willis Modena, MD;  Location: [facility];   Service: Endoscopy;  Laterality: N/A;   EYE SURGERY  08/2021   Cataract surgey   HAND SURGERY Bilateral    for arthritis  left 02/02/14- right approx 2014   KNEE SURGERY Bilateral    right x 2 left x 1.  arthroscopy   RADICAL HYSTERECTOMY     thumb and pinkie finger surgery Right 06/19/2012   TONSILLECTOMY     Patient Active Problem List   Diagnosis Date Noted   Chronic pain syndrome 12/05/2022   Urinary incontinence 12/05/2022   Plantar fasciitis 12/05/2022   Insomnia 12/05/2022   Dry skin 12/05/2022   Dizziness and giddiness 12/05/2022   Cataract 12/05/2022   Anxiety 12/05/2022   Chloasma 12/05/2022   Cyst of kidney, acquired 12/05/2022   Cyst of thyroid 12/05/2022   Chronic sinusitis, unspecified 09/06/2022   Spinal stenosis of lumbar region 01/10/2022   Ex-cigarette smoker 08/23/2021   Tobacco dependence in remission 06/09/2020   Spondylolysis 05/27/2018   Unspecified inflammatory spondylopathy, lumbar region (HCC) 05/27/2018   Lumbar spondylosis 04/15/2018   Other hyperlipidemia 06/21/2017   Acquired absence of other genital organ(s) 06/05/2017   Headache 01/26/2017   Neck pain 11/07/2016   Radon exposure 04/03/2016  Allergic rhinitis 02/15/2016   Genetic testing 09/09/2015   Primary osteoarthritis of right knee 09/09/2015   Family history of breast cancer    Family history of pancreatic cancer    Chronic bilateral low back pain without sciatica 04/20/2015   Trochanteric bursitis of left hip 04/20/2015   Cervical myofascial pain syndrome 04/20/2015   Medicare annual wellness visit, subsequent 02/10/2015   Gallstones 02/16/2014   Hyperparathyroidism (HCC) 01/29/2013   Chronic back pain 10/21/2012   ALOPECIA 03/05/2009   Obstructive sleep apnea 10/06/2008   HEMORRHOIDS-INTERNAL 04/28/2008   History of colonic polyps 04/24/2008   Hyperlipidemia LDL goal <100 03/20/2007   DISORDER, BIPOLAR NEC 03/20/2007   Depression 03/20/2007    PCP: Hetty Blend  NP  REFERRING PROVIDER: Venita Lick MD  REFERRING DIAG: M54.51 (ICD-10-CM) - Vertebrogenic low back pain   Rationale for Evaluation and Treatment: Rehabilitation  THERAPY DIAG:  Other low back pain  Muscle weakness (generalized)  Abnormal posture  Other abnormalities of gait and mobility  ONSET DATE: >40 yrs  SUBJECTIVE:                                                                                                                                                                                           SUBJECTIVE STATEMENT: Pt reports she feels the time spent resting with meds have helped her to tolerate the exercise.  Pain is not radiating into lle anymore. I am just starting to enjoy the therapy  POOL ACCESS:  Pt interested in joining [employer], but has not yet.   Initial subjective I am trying therapy to avoid back surgery.  [facility]  Had massages before in last home state and it helped for a Brand[place] while. Has aide 11 hours a week. She helps me bath. Bending putting dishes in dishwasher, getting laundry out of dryer. New In town in past year.   PERTINENT HISTORY:  KNEE oa receiving gel shots Chronic pain syndrome Bi-polar  PAIN:  Are you having pain? Yes: NPRS scale: 2/10; Pain location: LB with radiation into left ankle  Pain description: piercing  Aggravating factors: bending, twisting, sitting upright, cold, standing x 5 mins; walking 5-10 min Relieving factors: meds  PRECAUTIONS: None  RED FLAGS: None   WEIGHT BEARING RESTRICTIONS: No  FALLS:  Has patient fallen in last 6 months? No  LIVING ENVIRONMENT: Lives with: lives alone Lives in: House/apartment Stairs: No Has following equipment at home: Single point cane, Environmental consultant - 4 wheeled, Marine scientist  OCCUPATION: disabled  PLOF: Needs assistance with ADLs and Needs assistance with homemaking  PATIENT GOALS: decrease  pain; walk better  NEXT MD  VISIT: 4-5 weeks  OBJECTIVE:  Note: Objective measures were completed at Evaluation unless otherwise noted.  DIAGNOSTIC FINDINGS:  MRI of the lumbar spine as well as x-rays lumbar spine region. She does have a grade 1 spondylolisthesis on plain films on the lateral view. Significant spondylitic disease at L4 5 greater than L5 1   PATIENT SURVEYS:  FOTO Primary measure 24%; risk adjusted 42%; goal 40%   08/02/23: 54% COGNITION: Overall cognitive status: Within functional limits for tasks assessed     SENSATION: WFL  MUSCLE LENGTH: Hamstrings: bilaterally tight visualized in sitting   POSTURE: rounded shoulders, forward head, and decreased lumbar lordosis  PALPATION: TTP  LUMBAR ROM:  High pain sensitivity limiting lumbar ROM AROM eval 08/02/23  Flexion 100% limited 75% limited  Extension 100% limited 50% limited  Right lateral flexion 100% limited 75% limited  Left lateral flexion 90% limited 75%  Right rotation    Left rotation     (Blank rows = not tested)  LOWER EXTREMITY ROM:      Not tested due to pain sensitivity  LOWER EXTREMITY MMT:     Not tested due to pain sensitivity. At least 3/5 in hip and knee    LUMBAR SPECIAL TESTS:  Not tolerated  FUNCTIONAL TESTS:  5 times sit to stand: TBA Timed up and go (TUG): 25.85 4 stage balance: NBOS needs assist gaining position. 3 MWT:110ft 1 standing rest period, no ad  08/04/23: TUG:15.89   4 stage balance passed:1&2.  Tandem 10s; SLS x12s    GAIT: Distance walked: 123 Assistive device utilized: None pt encouraged to use rollator when coming to sessions for toleration to exercise as well as safety.  She VU Level of assistance: SBA Comments: Increased time spent in stance bilaterally, lateral displacement  TODAY'S TREATMENT:                                                                                                                              Re-cert testing  Pt seen for aquatic therapy today.  Treatment  took place in water 3.5-4.75 ft in depth at the [facility] pool. Temp of water was 91.  Pt entered/exited the pool via stairs  with hand rail.   *unsupported, walking forward/ backward/side stepping - multiple laps  * side stepping x 4 lap; with addct/ abdct with rainbow hand floats x 4 laps * farmer carry with bilat then unilateral rainbow hand floats under water at side with marching forward/ backward * L stretch at wall x 30s *TrA RB HB: wide stance x8  then staggered stance x 8 * SLS in 96ft 6" x 20 sec each UE support RB HB * L stretch at wall *step ups leading R/L 2 x 8.  Cues for TKE rising with rle *L stretch *hip hiking R/L bottom step 2 x 10.  VC and demonstration forexecution *decompression with noodle wrapped anteriorly across chest  Pt requires the buoyancy and hydrostatic pressure of water for support, and to offload joints by unweighting joint load by at least 50 % in navel deep water and by at least 75-80% in chest to neck deep water.  Viscosity of the water is needed for resistance of strengthening. Water current perturbations provides challenge to standing balance requiring increased core activation.     PATIENT EDUCATION:  Education details: aquatic therapy progressions/ modifications Person educated: Patient Education method: Explanation Education comprehension: verbalized understanding  HOME EXERCISE PROGRAM: tba  ASSESSMENT:  CLINICAL IMPRESSION: PN: pt progressing well.  Functional testing demonstrates improvement in balance,  lower extremity function, mobility and decreased fall risk. Her overall pain sensitivity has reduced past week to 2/10.  She has begun using meds as prescribed by MD with more regularity which is assisting in pain management allow increased toleration to therapy.  She will continue to benefit from skolled PT intervention using the properies of water to progress to meeting all land based goals.  Plan to see in aquatics 1 x  week to finish establishing final aquatic HEP while progressing strengthening program and encouraging increased toleration to activity.    Initial Impression Patient is a 68 y.o. f who was seen today for physical therapy evaluation and treatment for LBP. She has a long hx of chronic back pain dating back into the 1970's.  She reports multiple episodes of PT throughout the years which have helped to ease her pain.  She is new in town in past year and just getting settled in new home.  Pain exacerbation due some to unpacking.  She does have an aide 11 hours a week to assist with ADL's and household chores.  She reports limitations where any static standing or bending/twisting is involved.  Relies on aide to help her with bathing.  She has had aquatic therapy in past.  Does not yet have access to a pool.  She is given list of area pools and is encouraged to check them out.  Her pain sensitivity is high today and she does not tolerate much objective or functional testing.  Will plan to test as pt can better tolerate. She is significantly limited in all areas and will benefit from skilled PT intervention to improve deficits, decrease and manage pain, as well as improve safety and QOL.  OBJECTIVE IMPAIRMENTS: Abnormal gait, decreased activity tolerance, decreased balance, decreased endurance, decreased knowledge of condition, decreased knowledge of use of DME, decreased mobility, difficulty walking, decreased ROM, decreased strength, impaired perceived functional ability, impaired flexibility, postural dysfunction, obesity, and pain.   ACTIVITY LIMITATIONS: carrying, lifting, bending, sitting, standing, squatting, sleeping, stairs, transfers, bed mobility, dressing, hygiene/grooming, and locomotion level  PARTICIPATION LIMITATIONS: meal prep, cleaning, laundry, shopping, community activity, and yard work  PERSONAL FACTORS: Past/current experiences, Time since onset of injury/illness/exacerbation, and 1-2  comorbidities: see problem list  are also affecting patient's functional outcome.   REHAB POTENTIAL: Good  CLINICAL DECISION MAKING: Evolving/moderate complexity  EVALUATION COMPLEXITY: Moderate   GOALS: Goals reviewed with patient? Yes  SHORT TERM GOALS: Target date: 06/29/23  Pt will tolerate full aquatic sessions consistently without increase in pain and with improving function to demonstrate good toleration and effectiveness of intervention.  Baseline: Goal status: Met 07/05/23  2.  Pt will amb to and from setting consistently using ad along with completing entire aquatics session without excessive fatigue or pain Baseline: Met 07/05/23  3.  Pt will complete tandem and SLS x >15s in 3.6 ft with  ue support as needed (submerged) Baseline:  Goal status: MET 07/31/23  4.  Pt to be indep with initial aquatic HEP and gain access to personal pool for completion Baseline: pt given area pool handout Goal status: In progress 07/05/23   LONG TERM GOALS: Target date: 09/07/23  Pt to improve Foto core by 10% to demonstrate improved perceived functional ability Baseline: 24% Goal status: Met/exceeded 08/02/23  2.  Pt will improve LE strength to at least 4/5 to demonstrate improved toleration to testing and increase safety with function Baseline:  Goal status: INITIAL  3.  Pt will stand NBOS and semi tandem x 20s to demonstrate improve balance Baseline:  Goal status: Met 08/02/23  4.  Pt will report decrease in LBP with amb by 3-4 NPRS to demonstrated improved ability to tolerate ADL's. Baseline:  Goal status: In progress 08/02/23  5.  Pt will improve on 3 MWT to 232ft demonstrating improved toleration to amb in home environment Baseline:  Goal status: INITIAL  6. Pt will improve on Tug test to <or= 18s to demonstrate improvement in lower extremity function, mobility and decreased fall risk. Baseline: 25.85 Goal status: Met 08/02/23 (15.89)  PLAN:  PT FREQUENCY: 1-2 x week  PT  DURATION: 5 weeks likely 5 more aquatic visits.  PLANNED INTERVENTIONS: 97164- PT Re-evaluation, 97110-Therapeutic exercises, 97530- Therapeutic activity, [id number]- Neuromuscular re-education, [phone]- Self Care, 60454- Manual therapy, [phone]- Gait training, [phone]- Orthotic Fit/training, [phone]- Aquatic Therapy, [phone]- Electrical stimulation (unattended), [phone]- Ionotophoresis 4mg /ml Dexamethasone, Patient/Family education, Balance training, Stair training, Taping, Dry Needling, Joint mobilization, DME instructions, Cryotherapy, and Moist heat.  PLAN FOR NEXT SESSION: Aquatics only: pain management/relief, general strengthening, stretching, gait and balance retraining, assist with gaining access to pool

## 2023-08-03 ENCOUNTER — Telehealth: Payer: Self-pay | Admitting: Cardiology

## 2023-08-03 MED ORDER — ROSUVASTATIN CALCIUM 10 MG PO TABS: 10.0000 mg | ORAL_TABLET | Freq: Every day | ORAL | 3 refills | Status: DC

## 2023-08-03 NOTE — Telephone Encounter (Signed)
Patient stated she will try taking Crestor 10 mg daily.Stated she has problems taking most medications.Advised to call back if she is unable to take.

## 2023-08-03 NOTE — Telephone Encounter (Signed)
Patient stated she is returning RN Cheryl's call.

## 2023-08-03 NOTE — Telephone Encounter (Signed)
Already spoke to patient coronary ct results given.

## 2023-08-03 NOTE — Addendum Note (Signed)
Addended by: Neoma Laming on: 08/03/2023 04:37 PM   Modules accepted: Orders

## 2023-08-07 ENCOUNTER — Encounter (HOSPITAL_BASED_OUTPATIENT_CLINIC_OR_DEPARTMENT_OTHER): Payer: Self-pay | Admitting: Physical Therapy

## 2023-08-07 ENCOUNTER — Ambulatory Visit (HOSPITAL_BASED_OUTPATIENT_CLINIC_OR_DEPARTMENT_OTHER): Payer: Medicare Other | Admitting: Physical Therapy

## 2023-08-07 DIAGNOSIS — M6281 Muscle weakness (generalized): Secondary | ICD-10-CM

## 2023-08-07 DIAGNOSIS — R293 Abnormal posture: Secondary | ICD-10-CM

## 2023-08-07 DIAGNOSIS — M5459 Other low back pain: Secondary | ICD-10-CM

## 2023-08-07 DIAGNOSIS — R2689 Other abnormalities of gait and mobility: Secondary | ICD-10-CM

## 2023-08-07 LAB — LAB REPORT - SCANNED
Calcium: 10.4
PTH: 38

## 2023-08-07 NOTE — Therapy (Signed)
OUTPATIENT PHYSICAL THERAPY THORACOLUMBAR TREATMENT  Patient Name: Brittany [name] MRN: [id number] DOB:Dec 03, 1955, 68 y.o., female Today's Date: 08/07/2023  END OF SESSION:  PT End of Session - 08/07/23 0723     Visit Number 11    Number of Visits 16    Date for PT Re-Evaluation 09/07/23    Authorization Type MCR    Progress Note Due on Visit 20    PT Start Time 0715    PT Stop Time 0754    PT Time Calculation (min) 39 min    Behavior During Therapy [facility] for tasks assessed/performed              Past Medical History:  Diagnosis Date   ACNE NEC    ALLERGIC RHINITIS    ALOPECIA    Anal fissure    Anxiety    Panic Attack.  None in a long time   Arthritis    Asymptomatic microscopic hematuria 12/05/2022   Bowel obstruction (HCC)    Breast mass 12/05/2022   Sep 04, 2008 Entered By: Bennie Pierini B Comment: benign on bx   Chronic headaches    migraine- none in years   Constipation    drinks tea with senna   DEPRESSION    Depression    DISORDER, BIPOLAR NEC    Family history of breast cancer    Family history of pancreatic cancer    GERD (gastroesophageal reflux disease)    HEMORRHOIDS-INTERNAL    History of stress test    ETT 10/17: Ex 7'; no chest pain, no ST changes, [facility] Treadmill Score 7   HYPERLIPIDEMIA    OBSTRUCTIVE SLEEP APNEA    Seasonal allergies    Past Surgical History:  Procedure Laterality Date   BREAST LUMPECTOMY Right last done 2005   x 2   CHOLECYSTECTOMY N/A 03/07/2014   Procedure: LAPAROSCOPIC CHOLECYSTECTOMY WITH INTRAOPERATIVE CHOLANGIOGRAM;  Surgeon: Claud Kelp, MD;  Location: [facility] OR;  Service: General;  Laterality: N/A;   EUS N/A 01/27/2014   Procedure: ESOPHAGEAL ENDOSCOPIC ULTRASOUND (EUS) RADIAL;  Surgeon: Willis Modena, MD;  Location: [facility] ENDOSCOPY;  Service: Endoscopy;  Laterality: N/A;   EYE SURGERY  08/2021   Cataract surgey   HAND SURGERY Bilateral    for arthritis  left 02/02/14- right approx 2014   KNEE SURGERY Bilateral     right x 2 left x 1.  arthroscopy   RADICAL HYSTERECTOMY     thumb and pinkie finger surgery Right 06/19/2012   TONSILLECTOMY     Patient Active Problem List   Diagnosis Date Noted   Chronic pain syndrome 12/05/2022   Urinary incontinence 12/05/2022   Plantar fasciitis 12/05/2022   Insomnia 12/05/2022   Dry skin 12/05/2022   Dizziness and giddiness 12/05/2022   Cataract 12/05/2022   Anxiety 12/05/2022   Chloasma 12/05/2022   Cyst of kidney, acquired 12/05/2022   Cyst of thyroid 12/05/2022   Chronic sinusitis, unspecified 09/06/2022   Spinal stenosis of lumbar region 01/10/2022   Ex-cigarette smoker 08/23/2021   Tobacco dependence in remission 06/09/2020   Spondylolysis 05/27/2018   Unspecified inflammatory spondylopathy, lumbar region (HCC) 05/27/2018   Lumbar spondylosis 04/15/2018   Other hyperlipidemia 06/21/2017   Acquired absence of other genital organ(s) 06/05/2017   Headache 01/26/2017   Neck pain 11/07/2016   Radon exposure 04/03/2016   Allergic rhinitis 02/15/2016   Genetic testing 09/09/2015   Primary osteoarthritis of right knee 09/09/2015   Family history of breast cancer    Family history of pancreatic cancer  Chronic bilateral low back pain without sciatica 04/20/2015   Trochanteric bursitis of left hip 04/20/2015   Cervical myofascial pain syndrome 04/20/2015   Medicare annual wellness visit, subsequent 02/10/2015   Gallstones 02/16/2014   Hyperparathyroidism (HCC) 01/29/2013   Chronic back pain 10/21/2012   ALOPECIA 03/05/2009   Obstructive sleep apnea 10/06/2008   HEMORRHOIDS-INTERNAL 04/28/2008   History of colonic polyps 04/24/2008   Hyperlipidemia LDL goal <100 03/20/2007   DISORDER, BIPOLAR NEC 03/20/2007   Depression 03/20/2007    PCP: Hetty Blend NP  REFERRING PROVIDER: Venita Lick MD  REFERRING DIAG: M54.51 (ICD-10-CM) - Vertebrogenic low back pain   Rationale for Evaluation and Treatment: Rehabilitation  THERAPY DIAG:  Other  low back pain  Muscle weakness (generalized)  Abnormal posture  Other abnormalities of gait and mobility  ONSET DATE: >40 yrs  SUBJECTIVE:                                                                                                                                                                                           SUBJECTIVE STATEMENT: Pt reports she thinks she should not depend on the cane as much.  She does state that using cane reduces pain in LE.   POOL ACCESS:  Pt interested in joining [employer], but has not yet.   Initial subjective I am trying therapy to avoid back surgery.  [facility]  Had massages before in last home state and it helped for a [place] while. Has aide 11 hours a week. She helps me bath. Bending putting dishes in dishwasher, getting laundry out of dryer. New In town in past year.   PERTINENT HISTORY:  KNEE oa receiving gel shots Chronic pain syndrome Bi-polar  PAIN:  Are you having pain? Yes: NPRS scale: 4/10; Pain location: LB with radiation into left knee  Pain description: piercing  Aggravating factors: bending, twisting, sitting upright, cold, standing x 5 mins; walking 5-10 min Relieving factors: meds  PRECAUTIONS: None  RED FLAGS: None   WEIGHT BEARING RESTRICTIONS: No  FALLS:  Has patient fallen in last 6 months? No  LIVING ENVIRONMENT: Lives with: lives alone Lives in: House/apartment Stairs: No Has following equipment at home: Single point cane, Environmental consultant - 4 wheeled, Marine scientist  OCCUPATION: disabled  PLOF: Needs assistance with ADLs and Needs assistance with homemaking  PATIENT GOALS: decrease pain; walk better  NEXT MD VISIT: 4-5 weeks  OBJECTIVE:  Note: Objective measures were completed at Evaluation unless otherwise noted.  DIAGNOSTIC FINDINGS:  MRI of the lumbar spine as well as x-rays lumbar spine region. She does have a  grade 1 spondylolisthesis on plain films on  the lateral view. Significant spondylitic disease at L4 5 greater than L5 1   PATIENT SURVEYS:  FOTO Primary measure 24%; risk adjusted 42%; goal 40%   08/02/23: 54% COGNITION: Overall cognitive status: Within functional limits for tasks assessed     SENSATION: WFL  MUSCLE LENGTH: Hamstrings: bilaterally tight visualized in sitting   POSTURE: rounded shoulders, forward head, and decreased lumbar lordosis  PALPATION: TTP  LUMBAR ROM:  High pain sensitivity limiting lumbar ROM AROM eval 08/02/23  Flexion 100% limited 75% limited  Extension 100% limited 50% limited  Right lateral flexion 100% limited 75% limited  Left lateral flexion 90% limited 75%  Right rotation    Left rotation     (Blank rows = not tested)  LOWER EXTREMITY ROM:      Not tested due to pain sensitivity  LOWER EXTREMITY MMT:     Not tested due to pain sensitivity. At least 3/5 in hip and knee    LUMBAR SPECIAL TESTS:  Not tolerated  FUNCTIONAL TESTS:  5 times sit to stand: TBA Timed up and go (TUG): 25.85 4 stage balance: NBOS needs assist gaining position. 3 MWT:148ft 1 standing rest period, no ad  08/04/23: TUG:15.89   4 stage balance passed:1&2.  Tandem 10s; SLS x12s    GAIT: Distance walked: 123 Assistive device utilized: None pt encouraged to use rollator when coming to sessions for toleration to exercise as well as safety.  She VU Level of assistance: SBA Comments: Increased time spent in stance bilaterally, lateral displacement  TODAY'S TREATMENT:                                                                                                                              Pt seen for aquatic therapy today.  Treatment took place in water 3.5-4.75 ft in depth at the [facility] pool. Temp of water was 91.  Pt entered/exited the pool via stairs  with hand rail.   *unsupported, walking forward/ backward/side stepping - multiple laps  * side stepping x 4 lap; with addct/ abdct with  rainbow hand floats x 4 laps * L stretch at wall x 20s x 2 * 3 way LE stretch with ankle supported by hollow blue noodle * farmer carry with bilat then unilateral yellow hand float under water at side with marching forward/ backward * staggered stance with tricep press down with yellow hand floats 2 x 10 * staggered stance with horiz abdct/ addct with yellow hand floats 2 x 10 * forward/ backward marching with reciprocal row motion with yellow hand floats *decompression with noodle wrapped anteriorly across chest - cycling forward   Pt requires the buoyancy and hydrostatic pressure of water for support, and to offload joints by unweighting joint load by at least 50 % in navel deep water and by at least 75-80% in chest to neck deep water.  Viscosity of the water is needed  for resistance of strengthening. Water current perturbations provides challenge to standing balance requiring increased core activation.  PATIENT EDUCATION:  Education details: aquatic therapy progressions/ modifications Person educated: Patient Education method: Explanation Education comprehension: verbalized understanding  HOME EXERCISE PROGRAM: tba  ASSESSMENT:  CLINICAL IMPRESSION: Pt reported gradual reduction of pain in back and LE to 2/10. She gets the most relief when suspended in deeper water cycling. She did report some irritation in L hand/wrist / elbow after completing UE exercises with hand floats; resolved with UE rest.  Encouraged pt to continue utilizing cane for offloading pressure in LE when symptoms are high. She will continue to benefit from skilled PT intervention using the properies of water to progress to meeting all land based goals.  Plan to see in aquatics 1x/ week to finish establishing final aquatic HEP while progressing strengthening program and encouraging increased toleration to activity.    Initial Impression Patient is a 68 y.o. f who was seen today for physical therapy evaluation and  treatment for LBP. She has a long hx of chronic back pain dating back into the 1970's.  She reports multiple episodes of PT throughout the years which have helped to ease her pain.  She is new in town in past year and just getting settled in new home.  Pain exacerbation due some to unpacking.  She does have an aide 11 hours a week to assist with ADL's and household chores.  She reports limitations where any static standing or bending/twisting is involved.  Relies on aide to help her with bathing.  She has had aquatic therapy in past.  Does not yet have access to a pool.  She is given list of area pools and is encouraged to check them out.  Her pain sensitivity is high today and she does not tolerate much objective or functional testing.  Will plan to test as pt can better tolerate. She is significantly limited in all areas and will benefit from skilled PT intervention to improve deficits, decrease and manage pain, as well as improve safety and QOL.  OBJECTIVE IMPAIRMENTS: Abnormal gait, decreased activity tolerance, decreased balance, decreased endurance, decreased knowledge of condition, decreased knowledge of use of DME, decreased mobility, difficulty walking, decreased ROM, decreased strength, impaired perceived functional ability, impaired flexibility, postural dysfunction, obesity, and pain.   ACTIVITY LIMITATIONS: carrying, lifting, bending, sitting, standing, squatting, sleeping, stairs, transfers, bed mobility, dressing, hygiene/grooming, and locomotion level  PARTICIPATION LIMITATIONS: meal prep, cleaning, laundry, shopping, community activity, and yard work  PERSONAL FACTORS: Past/current experiences, Time since onset of injury/illness/exacerbation, and 1-2 comorbidities: see problem list  are also affecting patient's functional outcome.   REHAB POTENTIAL: Good  CLINICAL DECISION MAKING: Evolving/moderate complexity  EVALUATION COMPLEXITY: Moderate   GOALS: Goals reviewed with patient?  Yes  SHORT TERM GOALS: Target date: 06/29/23  Pt will tolerate full aquatic sessions consistently without increase in pain and with improving function to demonstrate good toleration and effectiveness of intervention.  Baseline: Goal status: Met 07/05/23  2.  Pt will amb to and from setting consistently using ad along with completing entire aquatics session without excessive fatigue or pain Baseline: Met 07/05/23  3.  Pt will complete tandem and SLS x >15s in 3.6 ft with ue support as needed (submerged) Baseline:  Goal status: MET 07/31/23  4.  Pt to be indep with initial aquatic HEP and gain access to personal pool for completion Baseline: pt given area pool handout Goal status: In progress 07/05/23   LONG TERM  GOALS: Target date: 09/07/23  Pt to improve Foto core by 10% to demonstrate improved perceived functional ability Baseline: 24% Goal status: Met/exceeded 08/02/23  2.  Pt will improve LE strength to at least 4/5 to demonstrate improved toleration to testing and increase safety with function Baseline:  Goal status: INITIAL  3.  Pt will stand NBOS and semi tandem x 20s to demonstrate improve balance Baseline:  Goal status: Met 08/02/23  4.  Pt will report decrease in LBP with amb by 3-4 NPRS to demonstrated improved ability to tolerate ADL's. Baseline:  Goal status: In progress 08/02/23  5.  Pt will improve on 3 MWT to 266ft demonstrating improved toleration to amb in home environment Baseline:  Goal status: INITIAL  6. Pt will improve on Tug test to <or= 18s to demonstrate improvement in lower extremity function, mobility and decreased fall risk. Baseline: 25.85 Goal status: Met 08/02/23 (15.89)  PLAN:  PT FREQUENCY: 1-2 x week  PT DURATION: 5 weeks likely 5 more aquatic visits.  PLANNED INTERVENTIONS: 97164- PT Re-evaluation, 97110-Therapeutic exercises, 97530- Therapeutic activity, [id number]- Neuromuscular re-education, [phone]- Self Care, 09811- Manual therapy, [phone]-  Gait training, [phone]- Orthotic Fit/training, [phone]- Aquatic Therapy, [phone]- Electrical stimulation (unattended), [phone]- Ionotophoresis 4mg /ml Dexamethasone, Patient/Family education, Balance training, Stair training, Taping, Dry Needling, Joint mobilization, DME instructions, Cryotherapy, and Moist heat.  PLAN FOR NEXT SESSION: Aquatics only: pain management/relief, general strengthening, stretching, gait and balance retraining, assist with gaining access to pool   Mayer Camel, PTA 08/07/23 7:55 AM [facility] [address] [place], [place], [address] Phone: [phone]   Fax:  [phone]

## 2023-08-10 ENCOUNTER — Other Ambulatory Visit: Payer: Medicare Other

## 2023-08-14 ENCOUNTER — Ambulatory Visit (HOSPITAL_BASED_OUTPATIENT_CLINIC_OR_DEPARTMENT_OTHER): Payer: Self-pay | Admitting: Physical Therapy

## 2023-08-16 ENCOUNTER — Ambulatory Visit: Payer: Medicare Other | Admitting: Professional Counselor

## 2023-08-16 ENCOUNTER — Ambulatory Visit (HOSPITAL_BASED_OUTPATIENT_CLINIC_OR_DEPARTMENT_OTHER): Payer: Medicare Other | Admitting: Physical Therapy

## 2023-08-22 ENCOUNTER — Other Ambulatory Visit: Payer: Self-pay | Admitting: Emergency Medicine

## 2023-08-22 ENCOUNTER — Other Ambulatory Visit: Payer: Medicare Other

## 2023-08-22 DIAGNOSIS — Z780 Asymptomatic menopausal state: Secondary | ICD-10-CM

## 2023-08-23 ENCOUNTER — Ambulatory Visit (HOSPITAL_BASED_OUTPATIENT_CLINIC_OR_DEPARTMENT_OTHER): Payer: Self-pay | Attending: Geriatric Medicine | Admitting: Physical Therapy

## 2023-08-23 ENCOUNTER — Encounter (HOSPITAL_BASED_OUTPATIENT_CLINIC_OR_DEPARTMENT_OTHER): Payer: Self-pay | Admitting: Physical Therapy

## 2023-08-23 DIAGNOSIS — M5459 Other low back pain: Secondary | ICD-10-CM | POA: Insufficient documentation

## 2023-08-23 DIAGNOSIS — R2689 Other abnormalities of gait and mobility: Secondary | ICD-10-CM | POA: Diagnosis present

## 2023-08-23 DIAGNOSIS — M6281 Muscle weakness (generalized): Secondary | ICD-10-CM | POA: Insufficient documentation

## 2023-08-23 DIAGNOSIS — R293 Abnormal posture: Secondary | ICD-10-CM | POA: Diagnosis present

## 2023-08-23 NOTE — Therapy (Signed)
 OUTPATIENT PHYSICAL THERAPY THORACOLUMBAR TREATMENT  Patient Name: REGINE CHRISTIAN MRN: [id number] DOB:1956/03/17, 68 y.o., female Today's Date: 08/23/2023  END OF SESSION:  PT End of Session - 08/23/23 0723     Visit Number 12    Number of Visits 16    Date for PT Re-Evaluation 09/07/23    Authorization Type MCR    Progress Note Due on Visit 20    PT Start Time 0719    PT Stop Time 0758    PT Time Calculation (min) 39 min    Activity Tolerance Patient tolerated treatment well    Behavior During Therapy WFL for tasks assessed/performed              Past Medical History:  Diagnosis Date   ACNE NEC    ALLERGIC RHINITIS    ALOPECIA    Anal fissure    Anxiety    Panic Attack.  None in a long time   Arthritis    Asymptomatic microscopic hematuria 12/05/2022   Bowel obstruction (HCC)    Breast mass 12/05/2022   Sep 04, 2008 Entered By: Bennie Pierini B Comment: benign on bx   Chronic headaches    migraine- none in years   Constipation    drinks tea with senna   DEPRESSION    Depression    DISORDER, BIPOLAR NEC    Family history of breast cancer    Family history of pancreatic cancer    GERD (gastroesophageal reflux disease)    HEMORRHOIDS-INTERNAL    History of stress test    ETT 10/17: Ex 7'; no chest pain, no ST changes, [facility] Treadmill Score 7   HYPERLIPIDEMIA    OBSTRUCTIVE SLEEP APNEA    Seasonal allergies    Past Surgical History:  Procedure Laterality Date   BREAST LUMPECTOMY Right last done 2005   x 2   CHOLECYSTECTOMY N/A 03/07/2014   Procedure: LAPAROSCOPIC CHOLECYSTECTOMY WITH INTRAOPERATIVE CHOLANGIOGRAM;  Surgeon: Claud Kelp, MD;  Location: [facility] OR;  Service: General;  Laterality: N/A;   EUS N/A 01/27/2014   Procedure: ESOPHAGEAL ENDOSCOPIC ULTRASOUND (EUS) RADIAL;  Surgeon: Willis Modena, MD;  Location: [facility];  Service: Endoscopy;  Laterality: N/A;   EYE SURGERY  08/2021   Cataract surgey   HAND SURGERY Bilateral    for arthritis  left  02/02/14- right approx 2014   KNEE SURGERY Bilateral    right x 2 left x 1.  arthroscopy   RADICAL HYSTERECTOMY     thumb and pinkie finger surgery Right 06/19/2012   TONSILLECTOMY     Patient Active Problem List   Diagnosis Date Noted   Chronic pain syndrome 12/05/2022   Urinary incontinence 12/05/2022   Plantar fasciitis 12/05/2022   Insomnia 12/05/2022   Dry skin 12/05/2022   Dizziness and giddiness 12/05/2022   Cataract 12/05/2022   Anxiety 12/05/2022   Chloasma 12/05/2022   Cyst of kidney, acquired 12/05/2022   Cyst of thyroid 12/05/2022   Chronic sinusitis, unspecified 09/06/2022   Spinal stenosis of lumbar region 01/10/2022   Ex-cigarette smoker 08/23/2021   Tobacco dependence in remission 06/09/2020   Spondylolysis 05/27/2018   Unspecified inflammatory spondylopathy, lumbar region (HCC) 05/27/2018   Lumbar spondylosis 04/15/2018   Other hyperlipidemia 06/21/2017   Acquired absence of other genital organ(s) 06/05/2017   Headache 01/26/2017   Neck pain 11/07/2016   Radon exposure 04/03/2016   Allergic rhinitis 02/15/2016   Genetic testing 09/09/2015   Primary osteoarthritis of right knee 09/09/2015   Family history of breast  cancer    Family history of pancreatic cancer    Chronic bilateral low back pain without sciatica 04/20/2015   Trochanteric bursitis of left hip 04/20/2015   Cervical myofascial pain syndrome 04/20/2015   Medicare annual wellness visit, subsequent 02/10/2015   Gallstones 02/16/2014   Hyperparathyroidism (HCC) 01/29/2013   Chronic back pain 10/21/2012   ALOPECIA 03/05/2009   Obstructive sleep apnea 10/06/2008   HEMORRHOIDS-INTERNAL 04/28/2008   History of colonic polyps 04/24/2008   Hyperlipidemia LDL goal <100 03/20/2007   DISORDER, BIPOLAR NEC 03/20/2007   Depression 03/20/2007    PCP: Hetty Blend NP  REFERRING PROVIDER: Venita Lick MD  REFERRING DIAG: M54.51 (ICD-10-CM) - Vertebrogenic low back pain   Rationale for Evaluation  and Treatment: Rehabilitation  THERAPY DIAG:  Other low back pain  Muscle weakness (generalized)  Abnormal posture  ONSET DATE: >40 yrs  SUBJECTIVE:                                                                                                                                                                                           SUBJECTIVE STATEMENT: Pt reports her pain has been much less, especially in her LLE. She has a massage scheduled for after today's sessin.   POOL ACCESS:  Pt interested in joining [facility], but has not yet. Waiting to figure out finances.  Initial subjective I am trying therapy to avoid back surgery.  Will be having massages through the [place] starting tomorrow.  Had massages before in last home state and it helped for a [place] while. Has aide 11 hours a week. She helps me bath. Bending putting dishes in dishwasher, getting laundry out of dryer. New In town in past year.   PERTINENT HISTORY:  KNEE oa receiving gel shots Chronic pain syndrome Bi-polar  PAIN:  Are you having pain? Yes: NPRS scale: 2/10; Pain location: LB with radiation lightly into left thigh Pain description: annoying, "just enough to know it is there" Aggravating factors: bending, twisting, sitting upright, cold, standing x 5 mins; walking 5-10 min Relieving factors: meds  PRECAUTIONS: None  RED FLAGS: None   WEIGHT BEARING RESTRICTIONS: No  FALLS:  Has patient fallen in last 6 months? No  LIVING ENVIRONMENT: Lives with: lives alone Lives in: House/apartment Stairs: No Has following equipment at home: Single point cane, Environmental consultant - 4 wheeled, Marine scientist  OCCUPATION: disabled  PLOF: Needs assistance with ADLs and Needs assistance with homemaking  PATIENT GOALS: decrease pain; walk better  NEXT MD VISIT: 4-5 weeks  OBJECTIVE:  Note: Objective measures were completed at Evaluation unless otherwise noted.  DIAGNOSTIC FINDINGS:  MRI of the lumbar  spine as well as  x-rays lumbar spine region. She does have a grade 1 spondylolisthesis on plain films on the lateral view. Significant spondylitic disease at L4 5 greater than L5 1   PATIENT SURVEYS:  FOTO Primary measure 24%; risk adjusted 42%; goal 40%   08/02/23: 54% COGNITION: Overall cognitive status: Within functional limits for tasks assessed     SENSATION: WFL  MUSCLE LENGTH: Hamstrings: bilaterally tight visualized in sitting   POSTURE: rounded shoulders, forward head, and decreased lumbar lordosis  PALPATION: TTP  LUMBAR ROM:  High pain sensitivity limiting lumbar ROM AROM eval 08/02/23  Flexion 100% limited 75% limited  Extension 100% limited 50% limited  Right lateral flexion 100% limited 75% limited  Left lateral flexion 90% limited 75%  Right rotation    Left rotation     (Blank rows = not tested)  LOWER EXTREMITY ROM:      Not tested due to pain sensitivity  LOWER EXTREMITY MMT:     Not tested due to pain sensitivity. At least 3/5 in hip and knee    LUMBAR SPECIAL TESTS:  Not tolerated  FUNCTIONAL TESTS:  5 times sit to stand: TBA Timed up and go (TUG): 25.85 4 stage balance: NBOS needs assist gaining position. 3 MWT:188ft 1 standing rest period, no ad  08/04/23: TUG:15.89   4 stage balance passed:1&2.  Tandem 10s; SLS x12s    GAIT: Distance walked: 123 Assistive device utilized: None pt encouraged to use rollator when coming to sessions for toleration to exercise as well as safety.  She VU Level of assistance: SBA Comments: Increased time spent in stance bilaterally, lateral displacement  TODAY'S TREATMENT:                                                                                                                              Pt seen for aquatic therapy today.  Treatment took place in water 3.5-4.75 ft in depth at the [facility] pool. Temp of water was 91.  Pt entered/exited the pool via stairs  with hand rail.  *decompression with noodle  wrapped anteriorly across chest - cycling forward, hip abdct/ addct  * farmer carry with bilat then unilateral yellow hand float under water at side with marching forward/ backward * forward/ backward marching with reciprocal row motion with yellow hand floats  * side stepping x 4 lap; with addct/ abdct with rainbow hand floats x 4 laps * staggered stance with tricep press down with yellow hand floats 2 x 10 * staggered stance with horiz abdct/ addct with yellow hand floats 2 x 10 * UE support on yellow hand floats: 3 way LE kick x 5 each side, (LLE began to hurt in SLS) -  * return to walking forward/ backward  * L stretch at wall x 20s x 2 * decompression with noodle wrapped anteriorly across chest - cycling forward * 3 way LE stretch with ankle supported by hollow blue noodle  Pt requires the buoyancy and hydrostatic pressure of water for support, and to offload joints by unweighting joint load by at least 50 % in navel deep water and by at least 75-80% in chest to neck deep water.  Viscosity of the water is needed for resistance of strengthening. Water current perturbations provides challenge to standing balance requiring increased core activation.  PATIENT EDUCATION:  Education details: aquatic therapy progressions/ modifications Person educated: Patient Education method: Explanation Education comprehension: verbalized understanding  HOME EXERCISE PROGRAM: Access Code: [email or web address] URL: [email or web address] Date: 08/23/2023 Prepared by: [facility] [address] * not issued yet  ASSESSMENT:  CLINICAL IMPRESSION: Pt reported increase in LLE pain after completing a set of tricep push downs with yellow floats followed by 3 way LE kicks; eased with L stretch and return to cycling in deeper water.She will continue to benefit from skilled PT intervention using the properies of water to progress to meeting all land based goals.  Plan to see in aquatics 1x/  week to finish establishing final aquatic HEP while progressing strengthening program and encouraging increased toleration to activity.    Initial Impression Patient is a 68 y.o. f who was seen today for physical therapy evaluation and treatment for LBP. She has a long hx of chronic back pain dating back into the 1970's.  She reports multiple episodes of PT throughout the years which have helped to ease her pain.  She is new in town in past year and just getting settled in new home.  Pain exacerbation due some to unpacking.  She does have an aide 11 hours a week to assist with ADL's and household chores.  She reports limitations where any static standing or bending/twisting is involved.  Relies on aide to help her with bathing.  She has had aquatic therapy in past.  Does not yet have access to a pool.  She is given list of area pools and is encouraged to check them out.  Her pain sensitivity is high today and she does not tolerate much objective or functional testing.  Will plan to test as pt can better tolerate. She is significantly limited in all areas and will benefit from skilled PT intervention to improve deficits, decrease and manage pain, as well as improve safety and QOL.  OBJECTIVE IMPAIRMENTS: Abnormal gait, decreased activity tolerance, decreased balance, decreased endurance, decreased knowledge of condition, decreased knowledge of use of DME, decreased mobility, difficulty walking, decreased ROM, decreased strength, impaired perceived functional ability, impaired flexibility, postural dysfunction, obesity, and pain.   ACTIVITY LIMITATIONS: carrying, lifting, bending, sitting, standing, squatting, sleeping, stairs, transfers, bed mobility, dressing, hygiene/grooming, and locomotion level  PARTICIPATION LIMITATIONS: meal prep, cleaning, laundry, shopping, community activity, and yard work  PERSONAL FACTORS: Past/current experiences, Time since onset of injury/illness/exacerbation, and 1-2  comorbidities: see problem list  are also affecting patient's functional outcome.   REHAB POTENTIAL: Good  CLINICAL DECISION MAKING: Evolving/moderate complexity  EVALUATION COMPLEXITY: Moderate   GOALS: Goals reviewed with patient? Yes  SHORT TERM GOALS: Target date: 06/29/23  Pt will tolerate full aquatic sessions consistently without increase in pain and with improving function to demonstrate good toleration and effectiveness of intervention.  Baseline: Goal status: Met 07/05/23  2.  Pt will amb to and from setting consistently using ad along with completing entire aquatics session without excessive fatigue or pain Baseline: Met 07/05/23  3.  Pt will complete tandem and SLS x >15s in 3.6 ft with ue support as needed (submerged) Baseline:  Goal status: MET 07/31/23  4.  Pt to be indep with initial aquatic HEP and gain access to personal pool for completion Baseline: pt given area pool handout Goal status: In progress 07/05/23   LONG TERM GOALS: Target date: 09/07/23  Pt to improve Foto core by 10% to demonstrate improved perceived functional ability Baseline: 24% Goal status: Met/exceeded 08/02/23  2.  Pt will improve LE strength to at least 4/5 to demonstrate improved toleration to testing and increase safety with function Baseline:  Goal status: INITIAL  3.  Pt will stand NBOS and semi tandem x 20s to demonstrate improve balance Baseline:  Goal status: Met 08/02/23  4.  Pt will report decrease in LBP with amb by 3-4 NPRS to demonstrated improved ability to tolerate ADL's. Baseline:  Goal status: In progress 08/02/23  5.  Pt will improve on 3 MWT to 211ft demonstrating improved toleration to amb in home environment Baseline:  Goal status: INITIAL  6. Pt will improve on Tug test to <or= 18s to demonstrate improvement in lower extremity function, mobility and decreased fall risk. Baseline: 25.85 Goal status: Met 08/02/23 (15.89)  PLAN:  PT FREQUENCY: 1-2 x week  PT  DURATION: 5 weeks likely 5 more aquatic visits.  PLANNED INTERVENTIONS: 97164- PT Re-evaluation, 97110-Therapeutic exercises, 97530- Therapeutic activity, [id number]- Neuromuscular re-education, [phone]- Self Care, 19147- Manual therapy, [phone]- Gait training, [phone]- Orthotic Fit/training, [phone]- Aquatic Therapy, [phone]- Electrical stimulation (unattended), [phone]- Ionotophoresis 4mg /ml Dexamethasone, Patient/Family education, Balance training, Stair training, Taping, Dry Needling, Joint mobilization, DME instructions, Cryotherapy, and Moist heat.  PLAN FOR NEXT SESSION: Aquatics only: pain management/relief, general strengthening, stretching, gait and balance retraining, assist with gaining access to pool  Mayer Camel, PTA 08/23/23 7:57 AM [facility] [address] [place], [place], [address] Phone: [phone]   Fax:  [phone]

## 2023-08-27 ENCOUNTER — Ambulatory Visit (HOSPITAL_BASED_OUTPATIENT_CLINIC_OR_DEPARTMENT_OTHER): Payer: Self-pay | Admitting: Physical Therapy

## 2023-08-28 ENCOUNTER — Encounter (HOSPITAL_BASED_OUTPATIENT_CLINIC_OR_DEPARTMENT_OTHER): Payer: Self-pay | Admitting: Physical Therapy

## 2023-08-28 ENCOUNTER — Ambulatory Visit (HOSPITAL_BASED_OUTPATIENT_CLINIC_OR_DEPARTMENT_OTHER): Payer: Self-pay | Admitting: Physical Therapy

## 2023-08-28 ENCOUNTER — Ambulatory Visit: Payer: Medicare Other | Admitting: Family Medicine

## 2023-08-28 DIAGNOSIS — M5459 Other low back pain: Secondary | ICD-10-CM

## 2023-08-28 DIAGNOSIS — R2689 Other abnormalities of gait and mobility: Secondary | ICD-10-CM

## 2023-08-28 DIAGNOSIS — R293 Abnormal posture: Secondary | ICD-10-CM

## 2023-08-28 DIAGNOSIS — M6281 Muscle weakness (generalized): Secondary | ICD-10-CM

## 2023-08-28 NOTE — Therapy (Signed)
 OUTPATIENT PHYSICAL THERAPY THORACOLUMBAR TREATMENT  Patient Name: Brittany [name] MRN: [id number] DOB:[birth year], 68 y.o., female Today's Date: 08/28/2023  END OF SESSION:  PT End of Session - 08/28/23 0803     Visit Number 13    Number of Visits 16    Date for PT Re-Evaluation 09/07/23    Authorization Type MCR    Progress Note Due on Visit 20    PT Start Time 0800    PT Stop Time 0840    PT Time Calculation (min) 40 min    Activity Tolerance Patient tolerated treatment well    Behavior During Therapy WFL for tasks assessed/performed               Past Medical History:  Diagnosis Date   ACNE NEC    ALLERGIC RHINITIS    ALOPECIA    Anal fissure    Anxiety    Panic Attack.  None in a long time   Arthritis    Asymptomatic microscopic hematuria 12/05/2022   Bowel obstruction (HCC)    Breast mass 12/05/2022   Sep 04, 2008 Entered By: Bennie Pierini B Comment: benign on bx   Chronic headaches    migraine- none in years   Constipation    drinks tea with senna   DEPRESSION    Depression    DISORDER, BIPOLAR NEC    Family history of breast cancer    Family history of pancreatic cancer    GERD (gastroesophageal reflux disease)    HEMORRHOIDS-INTERNAL    History of stress test    ETT 10/17: Ex 7'; no chest pain, no ST changes, [facility] Treadmill Score 7   HYPERLIPIDEMIA    OBSTRUCTIVE SLEEP APNEA    Seasonal allergies    Past Surgical History:  Procedure Laterality Date   BREAST LUMPECTOMY Right last done 2005   x 2   CHOLECYSTECTOMY N/A 03/07/2014   Procedure: LAPAROSCOPIC CHOLECYSTECTOMY WITH INTRAOPERATIVE CHOLANGIOGRAM;  Surgeon: Claud Kelp, MD;  Location: [facility] OR;  Service: General;  Laterality: N/A;   EUS N/A 01/27/2014   Procedure: ESOPHAGEAL ENDOSCOPIC ULTRASOUND (EUS) RADIAL;  Surgeon: Willis Modena, MD;  Location: [facility];  Service: Endoscopy;  Laterality: N/A;   EYE SURGERY  08/2021   Cataract surgey   HAND SURGERY Bilateral    for arthritis   left 02/02/14- right approx 2014   KNEE SURGERY Bilateral    right x 2 left x 1.  arthroscopy   RADICAL HYSTERECTOMY     thumb and pinkie finger surgery Right 06/19/2012   TONSILLECTOMY     Patient Active Problem List   Diagnosis Date Noted   Chronic pain syndrome 12/05/2022   Urinary incontinence 12/05/2022   Plantar fasciitis 12/05/2022   Insomnia 12/05/2022   Dry skin 12/05/2022   Dizziness and giddiness 12/05/2022   Cataract 12/05/2022   Anxiety 12/05/2022   Chloasma 12/05/2022   Cyst of kidney, acquired 12/05/2022   Cyst of thyroid 12/05/2022   Chronic sinusitis, unspecified 09/06/2022   Spinal stenosis of lumbar region 01/10/2022   Ex-cigarette smoker 08/23/2021   Tobacco dependence in remission 06/09/2020   Spondylolysis 05/27/2018   Unspecified inflammatory spondylopathy, lumbar region (HCC) 05/27/2018   Lumbar spondylosis 04/15/2018   Other hyperlipidemia 06/21/2017   Acquired absence of other genital organ(s) 06/05/2017   Headache 01/26/2017   Neck pain 11/07/2016   Radon exposure 04/03/2016   Allergic rhinitis 02/15/2016   Genetic testing 09/09/2015   Primary osteoarthritis of right knee 09/09/2015   Family history of  breast cancer    Family history of pancreatic cancer    Chronic bilateral low back pain without sciatica 04/20/2015   Trochanteric bursitis of left hip 04/20/2015   Cervical myofascial pain syndrome 04/20/2015   Medicare annual wellness visit, subsequent 02/10/2015   Gallstones 02/16/2014   Hyperparathyroidism (HCC) 01/29/2013   Chronic back pain 10/21/2012   ALOPECIA 03/05/2009   Obstructive sleep apnea 10/06/2008   HEMORRHOIDS-INTERNAL 04/28/2008   History of colonic polyps 04/24/2008   Hyperlipidemia LDL goal <100 03/20/2007   DISORDER, BIPOLAR NEC 03/20/2007   Depression 03/20/2007    PCP: Hetty Blend NP  REFERRING PROVIDER: Venita Lick MD  REFERRING DIAG: M54.51 (ICD-10-CM) - Vertebrogenic low back pain   Rationale for  Evaluation and Treatment: Rehabilitation  THERAPY DIAG:  Other low back pain  Muscle weakness (generalized)  Abnormal posture  Other abnormalities of gait and mobility  ONSET DATE: >40 yrs  SUBJECTIVE:                                                                                                                                                                                           SUBJECTIVE STATEMENT: Pt reports her pain is slightly elevated. Pt edu on [place] center for possible use of pool  POOL ACCESS:  Pt interested in joining [place], but has not yet. Waiting to figure out finances.  Initial subjective I am trying therapy to avoid back surgery.  Will be having massages through the [place] starting tomorrow.  Had massages before in last home state and it helped for a [place] while. Has aide 11 hours a week. She helps me bath. Bending putting dishes in dishwasher, getting laundry out of dryer. New In town in past year.   PERTINENT HISTORY:  KNEE oa receiving gel shots Chronic pain syndrome Bi-polar  PAIN:  Are you having pain? Yes: NPRS scale: 2/10; Pain location: LB with radiation lightly into left thigh Pain description: annoying, "just enough to know it is there" Aggravating factors: bending, twisting, sitting upright, cold, standing x 5 mins; walking 5-10 min Relieving factors: meds  PRECAUTIONS: None  RED FLAGS: None   WEIGHT BEARING RESTRICTIONS: No  FALLS:  Has patient fallen in last 6 months? No  LIVING ENVIRONMENT: Lives with: lives alone Lives in: House/apartment Stairs: No Has following equipment at home: Single point cane, Environmental consultant - 4 wheeled, Marine scientist  OCCUPATION: disabled  PLOF: Needs assistance with ADLs and Needs assistance with homemaking  PATIENT GOALS: decrease pain; walk better  NEXT MD VISIT: 4-5 weeks  OBJECTIVE:  Note: Objective measures were completed at Evaluation unless otherwise noted.  DIAGNOSTIC FINDINGS:  MRI  of the  lumbar spine as well as x-rays lumbar spine region. She does have a grade 1 spondylolisthesis on plain films on the lateral view. Significant spondylitic disease at L4 5 greater than L5 1   PATIENT SURVEYS:  FOTO Primary measure 24%; risk adjusted 42%; goal 40%   08/02/23: 54% COGNITION: Overall cognitive status: Within functional limits for tasks assessed     SENSATION: WFL  MUSCLE LENGTH: Hamstrings: bilaterally tight visualized in sitting   POSTURE: rounded shoulders, forward head, and decreased lumbar lordosis  PALPATION: TTP  LUMBAR ROM:  High pain sensitivity limiting lumbar ROM AROM eval 08/02/23  Flexion 100% limited 75% limited  Extension 100% limited 50% limited  Right lateral flexion 100% limited 75% limited  Left lateral flexion 90% limited 75%  Right rotation    Left rotation     (Blank rows = not tested)  LOWER EXTREMITY ROM:      Not tested due to pain sensitivity  LOWER EXTREMITY MMT:     Not tested due to pain sensitivity. At least 3/5 in hip and knee    LUMBAR SPECIAL TESTS:  Not tolerated  FUNCTIONAL TESTS:  5 times sit to stand: TBA Timed up and go (TUG): 25.85 4 stage balance: NBOS needs assist gaining position. 3 MWT:155ft 1 standing rest period, no ad  08/04/23: TUG:15.89   4 stage balance passed:1&2.  Tandem 10s; SLS x12s    GAIT: Distance walked: 123 Assistive device utilized: None pt encouraged to use rollator when coming to sessions for toleration to exercise as well as safety.  She VU Level of assistance: SBA Comments: Increased time spent in stance bilaterally, lateral displacement  TODAY'S TREATMENT:                                                                                                                              Pt seen for aquatic therapy today.  Treatment took place in water 3.5-4.75 ft in depth at the [facility] pool. Temp of water was 91.  Pt entered/exited the pool via stairs  with hand  rail.   Exercises - Marching Forward and Backward  - 3 x weekly - Farmer's Walk holding hand float at side under water  - 3 x weekly - Side Stepping with Hand Floats   - Standing Shoulder Horiz Abduction with Hand Floats    - Holding wall - leg kick to side (not too high)   - L stretch at pool wall    - 3 Way Leg Stretch with Ankle on Pool Noodle  - noodle under arms, bicycle legs in deeper water     Pt requires the buoyancy and hydrostatic pressure of water for support, and to offload joints by unweighting joint load by at least 50 % in navel deep water and by at least 75-80% in chest to neck deep water.  Viscosity of the water is needed for resistance of strengthening. Water current perturbations provides challenge to  standing balance requiring increased core activation.  PATIENT EDUCATION:  Education details: aquatic therapy progressions/ modifications Person educated: Patient Education method: Explanation Education comprehension: verbalized understanding  HOME EXERCISE PROGRAM: Access Code: [email or web address] URL: [email or web address] Date: 08/23/2023 Prepared by: [facility] * not issued yet; printed and laminated   Exercises - Marching Forward and Backward  - 3 x weekly - Farmer's Walk holding hand float at side under water  - 3 x weekly - Side Stepping with Hand Floats  - 3 x weekly - Pool noodle/  hand float pull down to front of thighs  - 3 x weekly - 1-2 sets - 10 reps - Standing Shoulder Horiz Abduction with Hand Floats   - 3 x weekly - 2 sets - 10 reps - Holding wall - leg kick to side (not too high)  - 3 x weekly - 2-3 sets - 10 reps - Forward Backward Leg Swing - hold wall or noodle   - 3 x weekly - 2-3 sets - 10 reps - L stretch at pool wall   - 3 x weekly - 2 reps - 10 seconds hold - 3 Way Leg Stretch with Ankle on Pool Noodle  - 3 x weekly - 2-3 reps - 15-30 seconds  hold - noodle under arms, bicycle legs in deeper water   -  3 x weekly  ASSESSMENT:  CLINICAL IMPRESSION: HEP is printed and laminated as well as instruction begun.  Not issued.  Next appt last one and will issue then. Pt given verbal cues, demonstration along with visuals of program for proper understanding and completion of program. Instruction given on frequency and duration for optimal outcome.  No pain with todays intervention.      Initial Impression Patient is a 68 y.o. f who was seen today for physical therapy evaluation and treatment for LBP. She has a long hx of chronic back pain dating back into the 1970's.  She reports multiple episodes of PT throughout the years which have helped to ease her pain.  She is new in town in past year and just getting settled in new home.  Pain exacerbation due some to unpacking.  She does have an aide 11 hours a week to assist with ADL's and household chores.  She reports limitations where any static standing or bending/twisting is involved.  Relies on aide to help her with bathing.  She has had aquatic therapy in past.  Does not yet have access to a pool.  She is given list of area pools and is encouraged to check them out.  Her pain sensitivity is high today and she does not tolerate much objective or functional testing.  Will plan to test as pt can better tolerate. She is significantly limited in all areas and will benefit from skilled PT intervention to improve deficits, decrease and manage pain, as well as improve safety and QOL.  OBJECTIVE IMPAIRMENTS: Abnormal gait, decreased activity tolerance, decreased balance, decreased endurance, decreased knowledge of condition, decreased knowledge of use of DME, decreased mobility, difficulty walking, decreased ROM, decreased strength, impaired perceived functional ability, impaired flexibility, postural dysfunction, obesity, and pain.   ACTIVITY LIMITATIONS: carrying, lifting, bending, sitting, standing, squatting, sleeping, stairs, transfers, bed mobility, dressing,  hygiene/grooming, and locomotion level  PARTICIPATION LIMITATIONS: meal prep, cleaning, laundry, shopping, community activity, and yard work  PERSONAL FACTORS: Past/current experiences, Time since onset of injury/illness/exacerbation, and 1-2 comorbidities: see problem list  are also affecting patient's functional outcome.  REHAB POTENTIAL: Good  CLINICAL DECISION MAKING: Evolving/moderate complexity  EVALUATION COMPLEXITY: Moderate   GOALS: Goals reviewed with patient? Yes  SHORT TERM GOALS: Target date: 06/29/23  Pt will tolerate full aquatic sessions consistently without increase in pain and with improving function to demonstrate good toleration and effectiveness of intervention.  Baseline: Goal status: Met 07/05/23  2.  Pt will amb to and from setting consistently using ad along with completing entire aquatics session without excessive fatigue or pain Baseline: Met 07/05/23  3.  Pt will complete tandem and SLS x >15s in 3.6 ft with ue support as needed (submerged) Baseline:  Goal status: MET 07/31/23  4.  Pt to be indep with initial aquatic HEP and gain access to personal pool for completion Baseline: pt given area pool handout Goal status: In progress 07/05/23   LONG TERM GOALS: Target date: 09/07/23  Pt to improve Foto core by 10% to demonstrate improved perceived functional ability Baseline: 24% Goal status: Met/exceeded 08/02/23  2.  Pt will improve LE strength to at least 4/5 to demonstrate improved toleration to testing and increase safety with function Baseline:  Goal status: INITIAL  3.  Pt will stand NBOS and semi tandem x 20s to demonstrate improve balance Baseline:  Goal status: Met 08/02/23  4.  Pt will report decrease in LBP with amb by 3-4 NPRS to demonstrated improved ability to tolerate ADL's. Baseline:  Goal status: In progress 08/02/23  5.  Pt will improve on 3 MWT to 222ft demonstrating improved toleration to amb in home environment Baseline:  Goal  status: INITIAL  6. Pt will improve on Tug test to <or= 18s to demonstrate improvement in lower extremity function, mobility and decreased fall risk. Baseline: 25.85 Goal status: Met 08/02/23 (15.89)  PLAN:  PT FREQUENCY: 1-2 x week  PT DURATION: 5 weeks likely 5 more aquatic visits.  PLANNED INTERVENTIONS: 97164- PT Re-evaluation, 97110-Therapeutic exercises, 97530- Therapeutic activity, [id number]- Neuromuscular re-education, [phone]- Self Care, 87564- Manual therapy, [phone]- Gait training, [phone]- Orthotic Fit/training, [phone]- Aquatic Therapy, [phone]- Electrical stimulation (unattended), [phone]- Ionotophoresis 4mg /ml Dexamethasone, Patient/Family education, Balance training, Stair training, Taping, Dry Needling, Joint mobilization, DME instructions, Cryotherapy, and Moist heat.  PLAN FOR NEXT SESSION: Aquatics only: pain management/relief, general strengthening, stretching, gait and balance retraining, assist with gaining access to pool  [place] Tomma Lightning) Kiersten Coss MPT 08/28/23 12:43 PM [facility] [address] [place], [place], [address] Phone: [phone]   Fax:  [phone]

## 2023-08-30 ENCOUNTER — Ambulatory Visit (INDEPENDENT_AMBULATORY_CARE_PROVIDER_SITE_OTHER): Payer: Medicare Other | Admitting: Professional Counselor

## 2023-08-30 ENCOUNTER — Encounter: Payer: Self-pay | Admitting: Professional Counselor

## 2023-08-30 DIAGNOSIS — F411 Generalized anxiety disorder: Secondary | ICD-10-CM | POA: Diagnosis not present

## 2023-08-30 DIAGNOSIS — F319 Bipolar disorder, unspecified: Secondary | ICD-10-CM | POA: Diagnosis not present

## 2023-08-30 NOTE — Progress Notes (Signed)
      [facility] Counselor/Therapist Progress Note  Patient ID: Brittany [name], MRN: [id number],    Date: 09/17/2023  Time Spent: 10:15 AM to 11:10 AM  Treatment Type: Individual Therapy  Reported Symptoms: Increased somatic pain, anhedonia, low motivation, low mood, fatigue, trouble concentrating, restlessness, nervousness, worries, irritability, stress  Mental Status Exam:  Appearance:   Casual     Behavior:  Appropriate and Sharing  Motor:  Normal  Speech/Language:   Clear and Coherent and Normal Rate  Affect:  Appropriate and Congruent  Mood:  normal  Thought process:  normal  Thought content:    WNL  Sensory/Perceptual disturbances:    WNL  Orientation:  oriented to person, place, time/date, and situation  Attention:  Good  Concentration:  Good  Memory:  WNL  Fund of knowledge:   Good  Insight:    Good  Judgment:   Good  Impulse Control:  Good   Risk Assessment: Danger to Self:  No Self-injurious Behavior: No Danger to Others: No Duty to Warn:no Physical Aggression / Violence:No  Access to Firearms a concern: No  Gang Involvement:No   Subjective: Patient presented to session to address concerns of depression per bipolar disorder with seasonal pattern, and anxiety.  She reported considerable increase in pain and depression, and anxiety as relates political and financial stress.  Counselor assisted patient in resourcing ideas to improve her circumstances and reduce exacerbated symptomology.  Patient identified her spiritual goals as primary to her coping skills at this time.  She processed interpersonal concerns, and counselor helped patient with interpersonal effectiveness skill building.  Patient identified to have a vacation pending and looking forward to it.  Counselor affirmed patient priority of self-care.  Interventions: Solution-Oriented/Positive Psychology, Humanistic/Existential, and Insight-Oriented, Interpersonal Effectiveness Skill-Building  Diagnosis:    ICD-10-CM   1. Bipolar I disorder with seasonal pattern (HCC)  F31.9     2. Generalized anxiety disorder  F41.1       Plan: Patient is scheduled for follow-up; continue process work and developing coping skills.  Patient personal goal between sessions to prioritize self-care, and resource coping strategies including faith and spirituality focus.  Progress note was dictated with Dragon and reviewed for accuracy.  Gaspar Bidding, [facility]

## 2023-09-06 ENCOUNTER — Encounter (HOSPITAL_BASED_OUTPATIENT_CLINIC_OR_DEPARTMENT_OTHER): Payer: Self-pay | Admitting: Physical Therapy

## 2023-09-06 ENCOUNTER — Ambulatory Visit (HOSPITAL_BASED_OUTPATIENT_CLINIC_OR_DEPARTMENT_OTHER): Payer: Self-pay | Admitting: Physical Therapy

## 2023-09-06 DIAGNOSIS — R293 Abnormal posture: Secondary | ICD-10-CM

## 2023-09-06 DIAGNOSIS — M6281 Muscle weakness (generalized): Secondary | ICD-10-CM

## 2023-09-06 DIAGNOSIS — M5459 Other low back pain: Secondary | ICD-10-CM | POA: Diagnosis not present

## 2023-09-06 NOTE — Therapy (Signed)
 OUTPATIENT PHYSICAL THERAPY THORACOLUMBAR TREATMENT PHYSICAL THERAPY DISCHARGE SUMMARY  Visits from Start of Care: 14  Current functional level related to goals / functional outcomes: Indep   Remaining deficits: Chronic condition   Education / Equipment: Management of condition/ HEP   Patient agrees to discharge. Patient goals were met. Patient is being discharged due to meeting the stated rehab goals.  Patient Name: Brittany [name] MRN: [id number] DOB:01-30-1956, 68 y.o., female Today's Date: 09/06/2023  END OF SESSION:  PT End of Session - 09/06/23 0816     Visit Number 14    Number of Visits 16    Date for PT Re-Evaluation 09/07/23    Authorization Type MCR    Progress Note Due on Visit 20    PT Start Time 0807    PT Stop Time 0845    PT Time Calculation (min) 38 min    Activity Tolerance Patient tolerated treatment well    Behavior During Therapy [facility] for tasks assessed/performed               Past Medical History:  Diagnosis Date   ACNE NEC    ALLERGIC RHINITIS    ALOPECIA    Anal fissure    Anxiety    Panic Attack.  None in a long time   Arthritis    Asymptomatic microscopic hematuria 12/05/2022   Bowel obstruction (HCC)    Breast mass 12/05/2022   Sep 04, 2008 Entered By: Bennie Pierini B Comment: benign on bx   Chronic headaches    migraine- none in years   Constipation    drinks tea with senna   DEPRESSION    Depression    DISORDER, BIPOLAR NEC    Family history of breast cancer    Family history of pancreatic cancer    GERD (gastroesophageal reflux disease)    HEMORRHOIDS-INTERNAL    History of stress test    ETT 10/17: Ex 7'; no chest pain, no ST changes, [facility] Treadmill Score 7   HYPERLIPIDEMIA    OBSTRUCTIVE SLEEP APNEA    Seasonal allergies    Past Surgical History:  Procedure Laterality Date   BREAST LUMPECTOMY Right last done 2005   x 2   CHOLECYSTECTOMY N/A 03/07/2014   Procedure: LAPAROSCOPIC CHOLECYSTECTOMY WITH INTRAOPERATIVE  CHOLANGIOGRAM;  Surgeon: Claud Kelp, MD;  Location: [facility] OR;  Service: General;  Laterality: N/A;   EUS N/A 01/27/2014   Procedure: ESOPHAGEAL ENDOSCOPIC ULTRASOUND (EUS) RADIAL;  Surgeon: Willis Modena, MD;  Location: [facility] ENDOSCOPY;  Service: Endoscopy;  Laterality: N/A;   EYE SURGERY  08/2021   Cataract surgey   HAND SURGERY Bilateral    for arthritis  left 02/02/14- right approx 2014   KNEE SURGERY Bilateral    right x 2 left x 1.  arthroscopy   RADICAL HYSTERECTOMY     thumb and pinkie finger surgery Right 06/19/2012   TONSILLECTOMY     Patient Active Problem List   Diagnosis Date Noted   Chronic pain syndrome 12/05/2022   Urinary incontinence 12/05/2022   Plantar fasciitis 12/05/2022   Insomnia 12/05/2022   Dry skin 12/05/2022   Dizziness and giddiness 12/05/2022   Cataract 12/05/2022   Anxiety 12/05/2022   Chloasma 12/05/2022   Cyst of kidney, acquired 12/05/2022   Cyst of thyroid 12/05/2022   Chronic sinusitis, unspecified 09/06/2022   Spinal stenosis of lumbar region 01/10/2022   Ex-cigarette smoker 08/23/2021   Tobacco dependence in remission 06/09/2020   Spondylolysis 05/27/2018   Unspecified inflammatory spondylopathy, lumbar region (  HCC) 05/27/2018   Lumbar spondylosis 04/15/2018   Other hyperlipidemia 06/21/2017   Acquired absence of other genital organ(s) 06/05/2017   Headache 01/26/2017   Neck pain 11/07/2016   Radon exposure 04/03/2016   Allergic rhinitis 02/15/2016   Genetic testing 09/09/2015   Primary osteoarthritis of right knee 09/09/2015   Family history of breast cancer    Family history of pancreatic cancer    Chronic bilateral low back pain without sciatica 04/20/2015   Trochanteric bursitis of left hip 04/20/2015   Cervical myofascial pain syndrome 04/20/2015   Medicare annual wellness visit, subsequent 02/10/2015   Gallstones 02/16/2014   Hyperparathyroidism (HCC) 01/29/2013   Chronic back pain 10/21/2012   ALOPECIA 03/05/2009    Obstructive sleep apnea 10/06/2008   HEMORRHOIDS-INTERNAL 04/28/2008   History of colonic polyps 04/24/2008   Hyperlipidemia LDL goal <100 03/20/2007   DISORDER, BIPOLAR NEC 03/20/2007   Depression 03/20/2007    PCP: Hetty Blend NP  REFERRING PROVIDER: Venita Lick MD  REFERRING DIAG: M54.51 (ICD-10-CM) - Vertebrogenic low back pain   Rationale for Evaluation and Treatment: Rehabilitation  THERAPY DIAG:  Other low back pain  Muscle weakness (generalized)  Abnormal posture  ONSET DATE: >40 yrs  SUBJECTIVE:                                                                                                                                                                                           SUBJECTIVE STATEMENT: Pt reports her pain is slightly elevated. Pt edu on [facility] for possible use of pool  POOL ACCESS:  Pt interested in joining [facility], but has not yet. Waiting to figure out finances.  Initial subjective I am trying therapy to avoid back surgery.  Will be having massages through the [place] starting tomorrow.  Had massages before in last home state and it helped for a [place] while. Has aide 11 hours a week. She helps me bath. Bending putting dishes in dishwasher, getting laundry out of dryer. New In town in past year.   PERTINENT HISTORY:  KNEE oa receiving gel shots Chronic pain syndrome Bi-polar  PAIN:  Are you having pain? Yes: NPRS scale: 2/10; Pain location: LB with radiation lightly into left thigh Pain description: annoying, "just enough to know it is there" Aggravating factors: bending, twisting, sitting upright, cold, standing x 5 mins; walking 5-10 min Relieving factors: meds  PRECAUTIONS: None  RED FLAGS: None   WEIGHT BEARING RESTRICTIONS: No  FALLS:  Has patient fallen in last 6 months? No  LIVING ENVIRONMENT: Lives with: lives alone Lives in: House/apartment Stairs: No Has following equipment at home: Single point cane, Environmental consultant -  4  wheeled, and Shower bench  OCCUPATION: disabled  PLOF: Needs assistance with ADLs and Needs assistance with homemaking  PATIENT GOALS: decrease pain; walk better  NEXT MD VISIT: 4-5 weeks  OBJECTIVE:  Note: Objective measures were completed at Evaluation unless otherwise noted.  DIAGNOSTIC FINDINGS:  MRI of the lumbar spine as well as x-rays lumbar spine region. She does have a grade 1 spondylolisthesis on plain films on the lateral view. Significant spondylitic disease at L4 5 greater than L5 1   PATIENT SURVEYS:  FOTO Primary measure 24%; risk adjusted 42%; goal 40%   08/02/23: 54% COGNITION: Overall cognitive status: Within functional limits for tasks assessed     SENSATION: WFL  MUSCLE LENGTH: Hamstrings: bilaterally tight visualized in sitting   POSTURE: rounded shoulders, forward head, and decreased lumbar lordosis  PALPATION: TTP  LUMBAR ROM:  High pain sensitivity limiting lumbar ROM AROM eval 08/02/23  Flexion 100% limited 75% limited  Extension 100% limited 50% limited  Right lateral flexion 100% limited 75% limited  Left lateral flexion 90% limited 75%  Right rotation    Left rotation     (Blank rows = not tested)  LOWER EXTREMITY ROM:      Not tested due to pain sensitivity  LOWER EXTREMITY MMT:     Not tested due to pain sensitivity. At least 3/5 in hip and knee   09/06/23: 4/5 pain in LB with resistance    LUMBAR SPECIAL TESTS:  Not tolerated  FUNCTIONAL TESTS:  5 times sit to stand: TBA Timed up and go (TUG): 25.85 4 stage balance: NBOS needs assist gaining position. 3 MWT:157ft 1 standing rest period, no ad  08/04/23: TUG:15.89   4 stage balance passed:1&2.  Tandem 10s; SLS x12s     09/06/23   3 MWT: 262 ft, no AD GAIT: Distance walked: 123 Assistive device utilized: None pt encouraged to use rollator when coming to sessions for toleration to exercise as well as safety.  She VU Level of assistance: SBA Comments: Increased time spent  in stance bilaterally, lateral displacement  TODAY'S TREATMENT:                                                                                                                              Pt seen for aquatic therapy today.  Treatment took place in water 3.5-4.75 ft in depth at the [facility] pool. Temp of water was 91.  Pt entered/exited the pool via stairs  with hand rail.   Exercises - Marching Forward and Backward  - 3 x weekly - Farmer's Walk holding hand float at side under water  - 3 x weekly - Side Stepping with Hand Floats   - Standing Shoulder Horiz Abduction with Hand Floats    - Holding wall - leg kick to side (not too high)   - L stretch at pool wall    - 3 Way Leg Stretch with Ankle on Pool Noodle  -  noodle under arms, bicycle legs in deeper water     Pt requires the buoyancy and hydrostatic pressure of water for support, and to offload joints by unweighting joint load by at least 50 % in navel deep water and by at least 75-80% in chest to neck deep water.  Viscosity of the water is needed for resistance of strengthening. Water current perturbations provides challenge to standing balance requiring increased core activation.  PATIENT EDUCATION:  Education details: aquatic therapy progressions/ modifications Person educated: Patient Education method: Explanation Education comprehension: verbalized understanding  HOME EXERCISE PROGRAM: Access Code: [email or web address] URL: [email or web address] Date: 08/23/2023 Prepared by: [facility] - [address] * not issued yet; printed and laminated   Exercises - Marching Forward and Backward  - 3 x weekly - Farmer's Walk holding hand float at side under water  - 3 x weekly - Side Stepping with Hand Floats  - 3 x weekly - Pool noodle/  hand float pull down to front of thighs  - 3 x weekly - 1-2 sets - 10 reps - Standing Shoulder Horiz Abduction with Hand Floats   - 3 x weekly - 2 sets - 10 reps -  Holding wall - leg kick to side (not too high)  - 3 x weekly - 2-3 sets - 10 reps - Forward Backward Leg Swing - hold wall or noodle   - 3 x weekly - 2-3 sets - 10 reps - L stretch at pool wall   - 3 x weekly - 2 reps - 10 seconds hold - 3 Way Leg Stretch with Ankle on Pool Noodle  - 3 x weekly - 2-3 reps - 15-30 seconds  hold - noodle under arms, bicycle legs in deeper water   - 3 x weekly  ASSESSMENT:  CLINICAL IMPRESSION: Pt directed through final HEP which has been laminated and issued. She is given minor vc and clarifications (written and verbal) for execution and to ensure indep.  She has met all goals and reached her max potential in setting and is ready for DC.   OBJECTIVE IMPAIRMENTS: Abnormal gait, decreased activity tolerance, decreased balance, decreased endurance, decreased knowledge of condition, decreased knowledge of use of DME, decreased mobility, difficulty walking, decreased ROM, decreased strength, impaired perceived functional ability, impaired flexibility, postural dysfunction, obesity, and pain.   ACTIVITY LIMITATIONS: carrying, lifting, bending, sitting, standing, squatting, sleeping, stairs, transfers, bed mobility, dressing, hygiene/grooming, and locomotion level  PARTICIPATION LIMITATIONS: meal prep, cleaning, laundry, shopping, community activity, and yard work  PERSONAL FACTORS: Past/current experiences, Time since onset of injury/illness/exacerbation, and 1-2 comorbidities: see problem list  are also affecting patient's functional outcome.   REHAB POTENTIAL: Good  CLINICAL DECISION MAKING: Evolving/moderate complexity  EVALUATION COMPLEXITY: Moderate   GOALS: Goals reviewed with patient? Yes  SHORT TERM GOALS: Target date: 06/29/23  Pt will tolerate full aquatic sessions consistently without increase in pain and with improving function to demonstrate good toleration and effectiveness of intervention.  Baseline: Goal status: Met 07/05/23  2.  Pt will amb  to and from setting consistently using ad along with completing entire aquatics session without excessive fatigue or pain Baseline: Met 07/05/23  3.  Pt will complete tandem and SLS x >15s in 3.6 ft with ue support as needed (submerged) Baseline:  Goal status: MET 07/31/23  4.  Pt to be indep with initial aquatic HEP and gain access to personal pool for completion Baseline: pt given area pool handout Goal status: In progress 07/05/23; Met 09/06/23  LONG TERM GOALS: Target date: 09/07/23  Pt to improve Foto core by 10% to demonstrate improved perceived functional ability Baseline: 24% Goal status: Met/exceeded 08/02/23  2.  Pt will improve LE strength to at least 4/5 to demonstrate improved toleration to testing and increase safety with function Baseline:  Goal status: Met 09/06/23  3.  Pt will stand NBOS and semi tandem x 20s to demonstrate improve balance Baseline:  Goal status: Met 08/02/23  4.  Pt will report decrease in LBP with amb by 3-4 NPRS to demonstrated improved ability to tolerate ADL's. Baseline:  Goal status: In progress 08/02/23; Met 09/06/23  5.  Pt will improve on 3 MWT to 234ft demonstrating improved toleration to amb in home environment Baseline:  Goal status: 262 ft Met 09/06/23  6. Pt will improve on Tug test to <or= 18s to demonstrate improvement in lower extremity function, mobility and decreased fall risk. Baseline: 25.85 Goal status: Met 08/02/23 (15.89)  PLAN:  PT FREQUENCY: 1-2 x week  PT DURATION: 5 weeks likely 5 more aquatic visits.  PLANNED INTERVENTIONS: 97164- PT Re-evaluation, 97110-Therapeutic exercises, 97530- Therapeutic activity, [id number]- Neuromuscular re-education, [phone]- Self Care, 57846- Manual therapy, [phone]- Gait training, [phone]- Orthotic Fit/training, [phone]- Aquatic Therapy, [phone]- Electrical stimulation (unattended), [phone]- Ionotophoresis 4mg /ml Dexamethasone, Patient/Family education, Balance training, Stair training, Taping, Dry Needling,  Joint mobilization, DME instructions, Cryotherapy, and Moist heat.  PLAN FOR NEXT SESSION: Aquatics only: pain management/relief, general strengthening, stretching, gait and balance retraining, assist with gaining access to pool  [place] Tomma Lightning) Stelios Kirby MPT 09/06/23 8:19 AM [facility] [address] [place], [place], [address] Phone: [phone]   Fax:  [phone]

## 2023-09-13 ENCOUNTER — Ambulatory Visit: Payer: Medicare Other | Admitting: Professional Counselor

## 2023-09-17 ENCOUNTER — Ambulatory Visit: Payer: Medicare Other | Admitting: Psychiatry

## 2023-09-17 NOTE — Progress Notes (Signed)
 No show

## 2023-09-18 ENCOUNTER — Encounter: Payer: Self-pay | Admitting: Psychiatry

## 2023-09-18 ENCOUNTER — Ambulatory Visit (INDEPENDENT_AMBULATORY_CARE_PROVIDER_SITE_OTHER): Admitting: Psychiatry

## 2023-09-18 DIAGNOSIS — F411 Generalized anxiety disorder: Secondary | ICD-10-CM | POA: Diagnosis not present

## 2023-09-18 DIAGNOSIS — F319 Bipolar disorder, unspecified: Secondary | ICD-10-CM | POA: Diagnosis not present

## 2023-09-18 DIAGNOSIS — F5105 Insomnia due to other mental disorder: Secondary | ICD-10-CM | POA: Diagnosis not present

## 2023-09-18 NOTE — Progress Notes (Signed)
 [name] [id number] 1956/04/01 68 y.o.     Subjective:   Patient ID:  Brittany [name] is a 68 y.o. (DOB Nov 08, 1955) female.  Chief Complaint:  Chief Complaint  Patient presents with   Follow-up    Mood and anxiety    HPI Brittany [name] presents to the office today for follow-up of bipolar disorder.    seen Oct 25, 2018.  For complaints of cognition Wellbutrin was increased to 450 mg each morning.  This was felt to be safer than introducing stimulants.  seen October 2020, the following was noted: Called back a couple of weeks later and wanted to reduce the dosage of Wellbutrin back to 300 mg daily.  It made her feel bad physically on edge.   Isolation is managed..  Mood relatively.  OK.  Has back pain that interferes with normal function.  House is a mess bc can't do much.  Hurt her back in there [employer]. NAC has helped after a couple of months.  Also feels more mellow and nice.  Still problems with concentration.  Asks about meds to help with attention span. Asks about Adderall.  Intermittent problems with forgetfulness and other days she drops the ball. Ok working on self care with walking and watching caffeine and doesn't want to take more meds.  Still living in [place].   Overall mood is pretty good without swings or irritability generally.   No meds were changed.  10/22/2019 appointment, the following is noted: She continues N-acetylcysteine, Wellbutrin XL 300 mg daily, Equetro 600 mg nightly, lamotrigine 300 mg daily, sertraline 50 mg daily. Had to cancel bc H so sick and died 10/14/19.  Haven't been alone.   Hasn't decided about where to live.  Will stay where she is for a year.  People are visiting.  [facility] service will be in June.   Peaceful passing.  The family has been supportive.  His kids have been OK.   Sold the hunting cabin to his boys.   She feels good about how she handled things.  Insurance will pay off the bills and has no mortgage.    Occ periods of  racing thoughts and periods of irrritability or periods of isolation.  Overall though doing well.  Overall mood is pretty good without swings or irritability generally.   Patient reports stable mood and denies depressed or irritable moods.  Patient denies any recent difficulty with anxiety.  Patient denies difficulty with sleep initiation or maintenance with melatonin. Denies appetite disturbance.  Patient reports that energy and motivation have been good.  Patient denies any difficulty with concentration.  Patient denies any suicidal ideation.  Protects sleeep 8-4AM.  Melatonin occ.  Not tired unusually.  06/28/2020 appt noted: Had more anxiety and increased Equetro to 1 twice daily and continued sertraline 50 bc it can make her more hyper.   Very sensitive to alcohol so can't have much of it.   Stopped caffeine unless travels. Anxiety was bad and it is better now. H deceased and problems with his 2 sons but not his daughters. One son with good manners and the other one lacks. Lose phone.   Adjusting pretty well to being a widow.  Can sleep alone and feel OK about it.  More accepting of being alone.  He left her money.  Takes Prevagen and she can tell a difference.   Going to [employer] and will be baptized into the faith. No recent temper problems lately.  Tolerating meds.  Sleep better.  More energy with Prevagen. Still back pain abu tdcan clean house.    12/30/20 appt noted: Getting along OK.  Having to expel H's son's from her property.  Will is supportive of her position.   Disc friend who is paranoid.  Still living in [place].   Recognizes pt had prior history of paranoia with prior manic ipisodes.  No longer has these sx. No problems with the meds. Patient reports stable mood and denies depressed or irritable moods.  Patient denies any recent difficulty with anxiety.  Patient denies difficulty with sleep initiation or maintenance. Denies appetite disturbance.  Patient reports that  energy and motivation have been good.  Patient denies any difficulty with concentration.  Patient denies any suicidal ideation. Satisfied with meds.  No problems with meds. Plan: no med changes  06/27/2021 appointment with the following noted: Takes all meds at night including Wellbutrin. Dogs wake her.  Don't feel safe without the dogs.  Asks if she can ever stop the meds.  Trying to move back to [place]. Not depressed but prone to anxiety and worry. Knows she should exercise. Plan: Cont meds.  No change indicated Continue Equetro 900 mg HS Continue Wellbutrin XL 300 mg every morning.  Option weaning. Defer. Continue sertraline 50 mg daily for panic Continue lamotrigine 150 mg 3 daily for depression Currently she is not using propranolol.  04/05/22 appt noted: Off Wellbutrin but taking other meds noted above.  Stopped bc anxiety. No panic lately. Increased NAC  and Prevagen and caused SE.  Had a fall twice in 8 weeks and was confused. Regular doses were fine and did help. Selling her house.  Haven't decided where yet. Consistent with other meds. Rx meclizine for dizziness. No other problems with meds.   Mood is OK. Plan: Cont meds.  No change indicated Continue Equetro 900 mg HS Off Wellbutrin Dt panic. Continue sertraline 50 mg daily for panic Continue lamotrigine 150 mg 3 daily for depression Currently she is not using propranolol.  10/18/22 appt noted: BC falling without explanation she weaned off all meds.  Figured out it was lamotrigine.  Hasn't falled since or had muscle jerks.   Was paranoid for ahwile but that resolved.  Had a lot of falls at night.  Mind wasn't clear and speech slurred and was dizzy.   Went off all meds in November and felt elated again.  Wondered if it was mania.  Cleaned house and did tasks.  If feels giddy then doesn't drive.   I think I might be ok on supplements.  %HTP 5 daily, SJW, NAC, vit B12, fish oil, MVI. Manages stress and possible  Socializes  with [place] people mostly.  Without meds has tendency to be irritable and hyperverbal. Remembering things better without meds.  Mind clearer without meds. Couldn't sleep off meds.  Called the [place].  Got [place] sleep for 12 days and given hydroxyzine 50 mg HS working pretty well.  Only psych med.  Without hydroxyzine feels she might have panic. She didn't think to call hear for help. Started talking to sister who takes hydroxyzine. Plan: [place] SE generic esp low cognitive risk Abilify .  She wants to try low dose 5 mg daily.  12/05/22 appt noted: Took Abilify for awhile and then started getting clumsy and stopped it. She thinks after 30 years of using med can't seem to take anything.  She doesn't think even low doses of lamotrigine she could take.  Asks  for something that is not for bipolar to tx her off label. Sleep at most 4 hours usually.  Both initial and terminal ins.  Dogs can interfere.   Not trying to be noncompliant but fearful of meds bc she is alone.   Worries Depakote affects thyroid. Exhausted from not sleeping and stressors from moving.   Thinks the next door neighbor is harrassing her shining lights in window at night.  Sometimes panic and sometimes fear at night.  Has big dog so does not fear being attacked.  Plans security system.  Plan: Option clonazepam off label sleep or anxiety.   0.5-1.0 mg HS for sleep she agrees Dog good for her mental health.  02/21/23 appt noted: Problems with sciatica and appt pending. Worries about dog.  Wants dog for support animal to be able to take dog into public places except restaurants. No clonazepam DT hangover and forgetful. Had to remodel kitchen.  So stressful.   Needs to do more and concerned about it.   Sciatica exacerbates anxiety and causes awakening.   Mood stability relatively good otherwise.  Will get down over some things but fighting it.  As long as she can keep functioning she doesn't want new meds. Taking ashwaghanda, 5  HTP, St John's Wort with benefit. Chronic ins since early childhood.  Use to sleep pretty well with Jari Favre.  Not used to sleeping alone.   Active at [facility]. Will let people know if I think I'm being taken advantage of.  No psych meds. But is some supplements.   She doesn't want to take other psych meds.   Manages without meds by limiting stress.  If gets in bad mood then isolates.    03/08/23 appt noted: Disc emotional support dog request, Cordelia.  Corgie mix.  Dog is calm.   Has not tried lorazepam yet.  Has it for emergencies.   When can't sleep plays Psalms quietly.  Helps. No mania.  No sig dep.  Some anxiety at times.   Some anxiety situationally and in her new house.   No psych meds.  Is willing to consider in future.  Says attn is better and perception is better of others and better with money.  She feels she handles others better.  Real sensitive to meds. Chronic conflict between her  and B and sister.  09/18/23 appt noted:  brings service dog Med:  only lorazepam very rare.   Off sertraline and AED.   Takes 5HTP, ST Jn wort , valeriarn root to sleep and ashwaganda. Using light box. Helped.   She thinks on psych med she is too nice and gave example of tree guys damaging her power lines.  She thinks on meds she would have paid them and shouldn't have.   She had to pay $2000 to have the line fixed out of her poscket.  Thinks being on med she was paying $800/mo to [facility] but wasn't always like this but became this on meds.   Gall bladder surgery.  Will sit on a chair all day when on psych meds. Was dep when H died but is ok now.   Always passive when I take meds.   Used to have fierce temper but mellowed out.  But some days still mad can be triggered.  I'm gonna be very cautious wiith my temper. Does a lot of CBT to help herself.  And it helps Feels dep but not severe but might be situational DT loss of H.  Restricts herself  socially avoiding crowds and heavy traffic.  But  walks.   Suspects meds like Zyrtec  Generally med avoidant.  Past Psychiatric Medication Trials:  naltrexone,  propranolol hydroxyzine 50 mg HS  wellbutrin 450 SE, sertraline 50,  lamotrigine 300,  lithium,  Geodon,   Equetro, Abilify 5 complaining of clumsy  Melatonin hangover at 10 mg  Doxepin Clonazepam 0.5 mg hangover  DUI at 27.  B very anxious  Review of Systems:  Review of Systems  Constitutional:  Positive for unexpected weight change.  Cardiovascular:  Negative for chest pain and palpitations.  Musculoskeletal:  Positive for arthralgias, back pain, gait problem and neck pain.  Neurological:  Negative for tremors.  Hematological:  Negative for adenopathy. Does not bruise/bleed easily.  Psychiatric/Behavioral:  Positive for sleep disturbance. Negative for agitation, behavioral problems, confusion, decreased concentration, dysphoric mood, hallucinations, self-injury and suicidal ideas. The patient is nervous/anxious. The patient is not hyperactive.    Still in physical therapy.  Getting nerve block for her back.  Medications: I have reviewed the patient's current medications.  Current Outpatient Medications  Medication Sig Dispense Refill   diclofenac sodium (VOLTAREN) 1 % GEL Apply 2 g topically 4 (four) times daily. 100 g 1   LORazepam (ATIVAN) 0.5 MG tablet Take 1 tablet (0.5 mg total) by mouth every 8 (eight) hours. 60 tablet 1   meloxicam (MOBIC) 15 MG tablet Take 1 tablet (15 mg total) by mouth daily. 30 tablet 1   metoprolol tartrate (LOPRESSOR) 25 MG tablet Take 25 mg   2 hours before Coronary CT 1 tablet 0   Multiple Vitamin (MULTIVITAMIN WITH MINERALS) TABS Take 1 tablet by mouth daily.     rosuvastatin (CRESTOR) 10 MG tablet Take 1 tablet (10 mg total) by mouth daily. 90 tablet 3   saline (AYR) GEL Place 1 Application into both nostrils every 6 (six) hours as needed. 42.3 g 0   tiZANidine (ZANAFLEX) 2 MG tablet      No current facility-administered  medications for this visit.    Medication Side Effects: None  Allergies:  Allergies  Allergen Reactions   Flexeril [Cyclobenzaprine]     Unable to leave home when taking as she is incoherent/unsure where she is at times   Hydrocodone     REACTION: GI upset   Motrin [Ibuprofen]    Nsaids     Other Reaction(s): Renal disease   Prednisone Other (See Comments)   Robaxin [Methocarbamol]     Impairs judgement    Past Medical History:  Diagnosis Date   ACNE NEC    ALLERGIC RHINITIS    ALOPECIA    Anal fissure    Anxiety    Panic Attack.  None in a long time   Arthritis    Asymptomatic microscopic hematuria 12/05/2022   Bowel obstruction (HCC)    Breast mass 12/05/2022   Sep 04, 2008 Entered By: Bennie Pierini B Comment: benign on bx   Chronic headaches    migraine- none in years   Constipation    drinks tea with senna   DEPRESSION    Depression    DISORDER, BIPOLAR NEC    Family history of breast cancer    Family history of pancreatic cancer    GERD (gastroesophageal reflux disease)    HEMORRHOIDS-INTERNAL    History of stress test    ETT 10/17: Ex 7'; no chest pain, no ST changes, [facility] Treadmill Score 7   HYPERLIPIDEMIA    OBSTRUCTIVE SLEEP APNEA    Seasonal  allergies     Family History  Problem Relation Age of Onset   Other Paternal Aunt        x 2, blood clotting disorder   Breast cancer Paternal Aunt 64   Heart disease Sister    Kidney disease Maternal Uncle    Esophageal cancer Paternal Uncle    Pancreatic cancer Paternal Uncle    COPD Father    Breast cancer Mother 25   Breast cancer Maternal Grandmother 67   Stroke Maternal Grandfather    Aneurysm Paternal Grandmother        brain   Hypertension Brother    Breast cancer Maternal Aunt        dx in her 78s   Breast cancer Maternal Aunt        dx in her 23s   Cancer Maternal Uncle        NOS   Cancer Maternal Uncle        tumor on his head   Breast cancer Paternal Aunt        dx in her 52s    Breast cancer Paternal Aunt        dx in her 29s   Lung cancer Paternal Uncle    Breast cancer Cousin 79       paternal first cousin   Colon cancer Maternal Uncle    Colon polyps Maternal Uncle    Irritable bowel syndrome Cousin    Rectal cancer Neg Hx    Stomach cancer Neg Hx     Social History   Socioeconomic History   Marital status: Married    Spouse name: Not on file   Number of children: 4   Years of education: Not on file   Highest education level: Not on file  Occupational History   Occupation: disabled    Employer: UNEMPLOYED  Tobacco Use   Smoking status: Former    Current packs/day: 0.00    Average packs/day: 0.1 packs/day for 7.0 years (0.7 ttl pk-yrs)    Types: Cigarettes    Start date: 06/19/1974    Quit date: 06/19/1981    Years since quitting: 42.2   Smokeless tobacco: Never  Vaping Use   Vaping status: Never Used  Substance and Sexual Activity   Alcohol use: No    Comment: rare   Drug use: No   Sexual activity: Not on file  Other Topics Concern   Not on file  Social History Narrative   Not on file   Social Drivers of Health   Financial Resource Strain: Low Risk  (01/22/2023)   Overall Financial Resource Strain (CARDIA)    Difficulty of Paying Living Expenses: Not hard at all  Recent Concern: Financial Resource Strain - Medium Risk (12/11/2022)   Overall Financial Resource Strain (CARDIA)    Difficulty of Paying Living Expenses: Somewhat hard  Food Insecurity: No Food Insecurity (01/22/2023)   Hunger Vital Sign    Worried About Running Out of Food in the Last Year: Never true    Ran Out of Food in the Last Year: Never true  Transportation Needs: No Transportation Needs (01/22/2023)   PRAPARE - Administrator, Civil Service (Medical): No    Lack of Transportation (Non-Medical): No  Physical Activity: Inactive (01/22/2023)   Exercise Vital Sign    Days of Exercise per Week: 0 days    Minutes of Exercise per Session: 0 min  Stress: Stress  Concern Present (01/22/2023)   [employer] of Occupational Health -  Occupational Stress Questionnaire    Feeling of Stress : Very much  Social Connections: Moderately Integrated (01/22/2023)   Social Connection and Isolation Panel [NHANES]    Frequency of Communication with Friends and Family: More than three times a week    Frequency of Social Gatherings with Friends and Family: More than three times a week    Attends Religious Services: More than 4 times per year    Active Member of [employer] or Organizations: Yes    Attends Banker Meetings: 1 to 4 times per year    Marital Status: Widowed  Intimate Partner Violence: Not At Risk (01/22/2023)   Humiliation, Afraid, Rape, and Kick questionnaire    Fear of Current or Ex-Partner: No    Emotionally Abused: No    Physically Abused: No    Sexually Abused: No    Past Medical History, Surgical history, Social history, and Family history were reviewed and updated as appropriate.   Please see review of systems for further details on the patient's review from today.   Objective:   Physical Exam:  There were no vitals taken for this visit.  Physical Exam Constitutional:      General: She is not in acute distress.    Appearance: Normal appearance.  Musculoskeletal:        General: No deformity.  Neurological:     Mental Status: She is alert and oriented to person, place, and time.     Coordination: Coordination normal.     Comments: Using cane  Psychiatric:        Attention and Perception: Attention and perception normal.        Mood and Affect: Mood is anxious. Mood is not depressed. Affect is not labile, angry or inappropriate.        Speech: Speech normal. Speech is not rapid and pressured.        Behavior: Behavior normal. Behavior is not slowed.        Thought Content: Thought content normal. Thought content is not delusional. Thought content does not include homicidal or suicidal ideation. Thought content does not  include suicidal plan.        Cognition and Memory: Cognition normal.        Judgment: Judgment normal.     Comments: Insight intact.  Occ irritable. No auditory or visual hallucinations. No delusions.  Talkative without  pressure.       Lab Review:     Component Value Date/Time   NA 140 06/28/2023 0953   K 4.9 06/28/2023 0953   CL 103 06/28/2023 0953   CO2 23 06/28/2023 0953   GLUCOSE 94 06/28/2023 0953   GLUCOSE 96 12/05/2022 0925   BUN 10 06/28/2023 0953   CREATININE 0.69 06/28/2023 0953   CALCIUM 10.4 08/06/2023 0000   PROT 7.0 12/05/2022 0925   ALBUMIN 3.9 12/05/2022 0925   AST 21 12/05/2022 0925   ALT 19 12/05/2022 0925   ALKPHOS 102 12/05/2022 0925   BILITOT 0.6 12/05/2022 0925   GFRNONAA >90 03/07/2014 0749   GFRAA >90 03/07/2014 0749       Component Value Date/Time   WBC 5.9 12/05/2022 0925   RBC 4.36 12/05/2022 0925   HGB 13.0 12/05/2022 0925   HCT 39.4 12/05/2022 0925   PLT 324.0 12/05/2022 0925   MCV 90.5 12/05/2022 0925   MCH 30.7 03/07/2014 0749   MCHC 32.9 12/05/2022 0925   RDW 14.3 12/05/2022 0925   LYMPHSABS 2.0 12/05/2022 0925   MONOABS 0.4 12/05/2022  0925   EOSABS 0.1 12/05/2022 0925   BASOSABS 0.0 12/05/2022 0925    Lithium Lvl  Date Value Ref Range Status  06/05/2013 0.59 (L) 0.80 - 1.40 mEq/L Final     No results found for: "PHENYTOIN", "PHENOBARB", "VALPROATE", "CBMZ"   .res Assessment: Plan:    Bipolar I disorder with seasonal pattern (HCC)  Generalized anxiety disorder  Insomnia due to mental condition  30 min face to face time with patient was spent on counseling and coordination of care.  Very med sensitive by her report.     Her husband passed April 2021.  She has had a lot of support.  She is handling it well. [employer] Witness faith has helped.  Disc bipolar dx and need for meds longterm vs episodic mood disorder given her ability  to get by so far wihtout mood stabilizer.  Hx mood px primarily were anger related.   Disc treatment plan and SE.  She is concerned about SE with meds and doesn't want to take bipolar meds despite risk without them.  There is some concern over mood-driven thought disruption.  Despite being informed about the sig mental health risks of not taking a mood stabilizer with bipolar disorder that is her decsion.  She feels her thought is sharper off meds and handling people better.  She feels she is bipolar based on past but coped with heavy activity and exercise.  Dx bipolar in past by [place].  Also drank like a fish with past hx DuI at 22.  No alcohol now. Disc off label high dose OFA but this is not likely adequate alternative.    She doesn't tolerate clonazepam.  She agrees to consider trial lorazepam 0.5 mg TID prn.  Disc risk without mood stabilizer. Off for several months without overt mania yet.    We discussed the short-term risks associated with benzodiazepines including sedation and increased fall risk among others.  Discussed long-term side effect risk including dependence, potential withdrawal symptoms, and the potential eventual dose-related risk of dementia.  But recent studies from 2020 dispute this association between benzodiazepines and dementia risk. Newer studies in 2020 do not support an association with dementia.  She has benfitted from NAC  She is against Depakote bc fear of it affecting liver. Disc category of atypicals for mood stabilization bc no other options otherwise.  Discussed potential metabolic side effects associated with atypical antipsychotics, as well as potential risk for movement side effects. Advised pt to contact office if movement side effects occur.   Specifically disc TD risk bc has a cousin with it.   Lowest SE generic esp low cognitive risk potential other DA partial agonists but complained of clumsy with Abilify.    Adjusted to living alone.  Consider Genesight testing DT med concerns.  Disc this.   Call if dep or mania gets worse which might  occur.  She generally is avoidant of all meds unless essential.  FU 6 mos bc ok without meds for a long time.    Meredith Staggers, MD, DFAPA   Please see After Visit Summary for patient specific instructions.  Future Appointments  Date Time Provider Department Center  01/24/2024  1:00 PM [facility]-ANNUAL WELLNESS VISIT 2 [facility] None  04/17/2024  2:00 PM [facility] DX DEXA 1 [facility]    No orders of the defined types were placed in this encounter.      -------------------------------

## 2023-09-24 ENCOUNTER — Ambulatory Visit: Payer: Medicare Other | Admitting: Professional Counselor

## 2023-10-08 ENCOUNTER — Ambulatory Visit: Payer: Medicare Other | Admitting: Professional Counselor

## 2023-10-22 ENCOUNTER — Ambulatory Visit: Payer: Medicare Other | Admitting: Professional Counselor

## 2023-11-28 ENCOUNTER — Encounter: Payer: Self-pay | Admitting: Podiatry

## 2023-11-28 ENCOUNTER — Ambulatory Visit (INDEPENDENT_AMBULATORY_CARE_PROVIDER_SITE_OTHER): Admitting: Podiatry

## 2023-11-28 DIAGNOSIS — M7662 Achilles tendinitis, left leg: Secondary | ICD-10-CM

## 2023-11-28 MED ORDER — TRIAMCINOLONE ACETONIDE 10 MG/ML IJ SUSP
10.0000 mg | Freq: Once | INTRAMUSCULAR | Status: AC
  Administered 2023-11-28: 10 mg via INTRA_ARTICULAR

## 2023-11-29 NOTE — Progress Notes (Signed)
 Subjective:   Patient ID: Brittany [name], female   DOB: 68 y.o.   MRN: [id number]   HPI Patient states she was doing very well with her heel when she started to develop a lot of pain in the back of the left heel for the last several months and she is getting ready to go on a trip and cannot walk like she needs   ROS      Objective:  Physical Exam  Neurovascular status intact with plantar fascia that is improved but a lot of pain in the posterior heel region left     Assessment:  Achilles tendinitis left lateral Achilles central medial not involved.  Very tender with swelling of the area     Plan:  Reviewed at great length discussed Achilles tendinitis and the inability for her to walk.  Today I did discuss injection treatment explained risk of doing this and risk of rupture she wants to do this and I went ahead today and I did sterile prep and I injected the lateral side staying away from central medial and applied sterile dressing.  I then applied air fracture walker for complete immobilization and I gave instructions on wearing this and had it fitted properly to her lower leg.  She will wear this full-time for 3 weeks and then can gradually reduce and patient will be seen back to check in 4 weeks

## 2023-11-30 ENCOUNTER — Telehealth: Payer: Self-pay | Admitting: Podiatry

## 2023-11-30 NOTE — Telephone Encounter (Signed)
 Pt left voicemail stating that the boot is very uncomfortable and hurting shin. Will like to know if she can get different device?

## 2023-12-03 NOTE — Telephone Encounter (Signed)
 She has already worn it. She can come in later this week for evaluation

## 2023-12-03 NOTE — Telephone Encounter (Signed)
 Try a piece of mole skin to protect the area or use ace wrap around the painful area

## 2023-12-06 ENCOUNTER — Ambulatory Visit: Admitting: Podiatry

## 2023-12-19 ENCOUNTER — Ambulatory Visit: Admitting: Podiatry

## 2023-12-26 ENCOUNTER — Telehealth: Payer: Self-pay | Admitting: Family Medicine

## 2023-12-26 NOTE — Telephone Encounter (Unsigned)
 Copied from CRM [id number]. Topic: Referral - Request for Referral >> Dec 26, 2023  2:36 PM Zenovia PARAS wrote: Did the patient discuss referral with their provider in the last year? No (If No - schedule appointment) (If Yes - send message)  Appointment offered? No  Type of order/referral and detailed reason for visit: Gastro  Preference of office, provider, location: n/a  If referral order, have you been seen by this specialty before? Yes (If Yes, this issue or another issue? When? Where?  Can we respond through MyChart? Yes

## 2023-12-27 NOTE — Telephone Encounter (Signed)
 Called pt and she reports she does not need referral anymore as her insurance doesn't require referrals for her to be seen at offices.   FYI.Brittany [name] pt reports she will be finding a new primary care provider as she states getting an appointment w Boby has become too hard and never has same day appointments. Gave pt information on how to obtain new provider

## 2024-01-07 NOTE — Progress Notes (Signed)
 01/08/2024 Iridian Reader Louk [id number] March 13, 1956  Referring provider: Lendia Boby CROME, NP-C Primary GI doctor: Dr. Suzann (Dr. Aneita)  ASSESSMENT AND PLAN:  Diarrhea x 2 weeks some improvement but continue with loose stools Similar to previous symptoms a year ago, treated for positive Ecoli No fever, chills, has had Intentional weight loss MRI abdomen with without 03/2022 unremarkable bowel - stool samples to rule out infection with diatherix  -CRP/ESR to rule out inflammation - TSH, CBC, CMET, celiac  - fodmap diet -Will schedule for endoscopic evaluation with colonoscopy and EGD, discussed with patient and agrees with plan. Risk of bowel prep, conscious sedation, and EGD and colonoscopy were discussed.  Risks include but are not limited to dehydration, pain, bleeding, cardiopulmonary process, bowel perforation, or other possible adverse outcomes..  Treatment plan was discussed with patient, and agreed upon.  -Consider SIBO testing or xifaxin trial pending results  CRC screening, maternal uncle with colon cancer April 2013 colonoscopy small hyperplastic polyps, internal hemorrhoid Schedule colon at [facility], We have discussed the risks of bleeding, infection, perforation, medication reactions, and remote risk of death associated with colonoscopy. All questions were answered and the patient acknowledges these risk and wishes to proceed.  GERD, nausea, and intermittent dysphagia 2009 EGD gastritis 2015 EUS for elevated LFTs showed gallstones otherwise unremarkable status post cholecystectomy 03/2022 MRI abdomen with and without contrast  follow-up renal cysts showed no hepatic mass no biliary ductal dilation normal pancreas and spleen normal stomach and bowel. -Schedule EGD with dilatation at [facility] with colonoscopy to evaluate for stenosis, tumor, erosive/infectious esophagititis, and EOE. I discussed risks of EGD with patient today, including risk of sedation, bleeding or perforation.   Patient provides understanding and gave verbal consent to proceed. -In the interim patient advised about swallowing precautions.  -Eat slowly, chew food well before swallowing.  -Drink liquids in between each bite to avoid food impaction. - ER precautions discussed with the patient   Patient Care Team: Lendia Boby CROME, NP-C as PCP - General (Family Medicine) Teresa Tinnie BROCKS, MD (Otolaryngology)  HISTORY OF PRESENT ILLNESS: 69 y.o. female with a past medical history listed below presents for evaluation of diarrhea, vomiting, discuss colonoscopy.   Patient was last seen in the office 03/07/2023 with Dr. Aneita for scheduled for screening colonoscopy which was not done due to not having a ride.  Discussed the use of AI scribe software for clinical note transcription with the patient, who gave verbal consent to proceed.  History of Present Illness   Shona G Kunda is a 68 year old female who presents with gastrointestinal symptoms and is overdue for a colonoscopy.  She experiences chronic diarrhea with loose stools, previously managed with antibiotics prescribed by her primary care provider. The antibiotics were effective for approximately nine months before symptoms recurred. No abdominal pain, blood in the stool, or dark stools. No history of vomiting. Occasionally experiences difficulty swallowing, attributed to eating too quickly. She has had nausea in the past.  She has a history of a positive E. coli infection treated with antibiotics around June 2024. The infection was associated with gastrointestinal symptoms that persisted for nearly two years before treatment.  She notes significant weight loss from over 190 pounds to 164 pounds, attributed to dietary changes aimed at alleviating knee pain prior to a planned knee replacement. Her current diet consists of vegetables, rice, fish, and fowl, with a strict avoidance of beef, pork, peanut butter, chocolate, and snacks. She consumes a lot of  cumin in her diet.  Family history of colon cancer on her mother's side, with her uncle affected.      She  reports that she quit smoking about 42 years ago. Her smoking use included cigarettes. She started smoking about 49 years ago. She has a 0.7 pack-year smoking history. She has never used smokeless tobacco. She reports that she does not drink alcohol and does not use drugs.  RELEVANT GI HISTORY, IMAGING AND LABS: Results   LABS E. coli: Positive (12/2022)  DIAGNOSTIC Colonoscopy: Hemorrhoids and small benign polyps removed (09/2011)      CBC    Component Value Date/Time   WBC 5.9 12/05/2022 0925   RBC 4.36 12/05/2022 0925   HGB 13.0 12/05/2022 0925   HCT 39.4 12/05/2022 0925   PLT 324.0 12/05/2022 0925   MCV 90.5 12/05/2022 0925   MCH 30.7 03/07/2014 0749   MCHC 32.9 12/05/2022 0925   RDW 14.3 12/05/2022 0925   LYMPHSABS 2.0 12/05/2022 0925   MONOABS 0.4 12/05/2022 0925   EOSABS 0.1 12/05/2022 0925   BASOSABS 0.0 12/05/2022 0925   No results for input(s): HGB in the last 8760 hours.  CMP     Component Value Date/Time   NA 140 06/28/2023 0953   K 4.9 06/28/2023 0953   CL 103 06/28/2023 0953   CO2 23 06/28/2023 0953   GLUCOSE 94 06/28/2023 0953   GLUCOSE 96 12/05/2022 0925   BUN 10 06/28/2023 0953   CREATININE 0.69 06/28/2023 0953   CALCIUM  10.4 08/06/2023 0000   PROT 7.0 12/05/2022 0925   ALBUMIN 3.9 12/05/2022 0925   AST 21 12/05/2022 0925   ALT 19 12/05/2022 0925   ALKPHOS 102 12/05/2022 0925   BILITOT 0.6 12/05/2022 0925   GFRNONAA >90 03/07/2014 0749   GFRAA >90 03/07/2014 0749      Latest Ref Rng & Units 12/05/2022    9:25 AM 01/26/2017   10:30 AM 11/07/2016    4:18 PM  Hepatic Function  Total Protein 6.0 - 8.3 g/dL 7.0  7.3  7.2   Albumin 3.5 - 5.2 g/dL 3.9  4.4  4.4   AST 0 - 37 U/L 21  26  20    ALT 0 - 35 U/L 19  52  37   Alk Phosphatase 39 - 117 U/L 102  117  103   Total Bilirubin 0.2 - 1.2 mg/dL 0.6  0.3  0.2       Current  Medications:     Current Outpatient Medications (Respiratory):    saline (AYR) GEL, Place 1 Application into both nostrils every 6 (six) hours as needed.  Current Outpatient Medications (Analgesics):    meloxicam  (MOBIC ) 15 MG tablet, Take 1 tablet (15 mg total) by mouth daily.   Current Outpatient Medications (Other):    Coenzyme Q10 (Q-10 CO-ENZYME PO), Take by mouth.   diclofenac  sodium (VOLTAREN ) 1 % GEL, Apply 2 g topically 4 (four) times daily.   Multiple Vitamin (MULTIVITAMIN WITH MINERALS) TABS, Take 1 tablet by mouth daily.   PEG-KCl-NaCl-NaSulf-Na Asc-C (PLENVU ) 140 g SOLR, Take 1 kit by mouth once for 1 dose.   tiZANidine (ZANAFLEX) 2 MG tablet,   Medical History:  Past Medical History:  Diagnosis Date   ACNE NEC    ALLERGIC RHINITIS    ALOPECIA    Anal fissure    Anxiety    Panic Attack.  None in a long time   Arthritis    Asymptomatic microscopic hematuria 12/05/2022   Bowel obstruction (  HCC)    Breast mass 12/05/2022   Sep 04, 2008 Entered By: MEMORY GULL B Comment: benign on bx   Chronic headaches    migraine- none in years   Constipation    drinks tea with senna   DEPRESSION    Depression    DISORDER, BIPOLAR NEC    Family history of breast cancer    Family history of pancreatic cancer    GERD (gastroesophageal reflux disease)    HEMORRHOIDS-INTERNAL    History of stress test    ETT 10/17: Ex 7'; no chest pain, no ST changes, [facility] Treadmill Score 7   HYPERLIPIDEMIA    OBSTRUCTIVE SLEEP APNEA    Seasonal allergies    Allergies:  Allergies  Allergen Reactions   Flexeril  [Cyclobenzaprine ]     Unable to leave home when taking as she is incoherent/unsure where she is at times   Hydrocodone      REACTION: GI upset   Motrin  [Ibuprofen ]    Nsaids     Other Reaction(s): Renal disease   Prednisone Other (See Comments)   Robaxin  [Methocarbamol ]     Impairs judgement     Surgical History:  She  has a past surgical history that includes Knee surgery  (Bilateral); Radical hysterectomy; Tonsillectomy; Breast lumpectomy (Right, last done 2005); thumb and pinkie finger surgery (Right, 06/19/2012); EUS (N/A, 01/27/2014); Hand surgery (Bilateral); Cholecystectomy (N/A, 03/07/2014); and Eye surgery (08/2021). Family History:  Her family history includes Aneurysm in her paternal grandmother; Breast cancer in her maternal aunt, maternal aunt, paternal aunt, and paternal aunt; Breast cancer (age of onset: 68) in her cousin; Breast cancer (age of onset: 48) in her maternal grandmother; Breast cancer (age of onset: 13) in her mother; Breast cancer (age of onset: 64) in her paternal aunt; COPD in her father; Cancer in her maternal uncle and maternal uncle; Colon cancer in her maternal uncle; Colon polyps in her maternal uncle; Esophageal cancer in her paternal uncle; Heart disease in her sister; Hypertension in her brother; Irritable bowel syndrome in her cousin; Kidney disease in her maternal uncle; Lung cancer in her paternal uncle; Other in her paternal aunt; Pancreatic cancer in her paternal uncle; Stroke in her maternal grandfather.  REVIEW OF SYSTEMS  : All other systems reviewed and negative except where noted in the History of Present Illness.  PHYSICAL EXAM: BP 118/68   Pulse (!) 55   Ht 5' 1 (1.549 m)   Wt 164 lb (74.4 kg)   BMI 30.99 kg/m  Physical Exam   GENERAL APPEARANCE: Well nourished, in no apparent distress. HEENT: No cervical lymphadenopathy, unremarkable thyroid , sclerae anicteric, conjunctiva pink. RESPIRATORY: Respiratory effort normal, breath sounds clear to auscultation bilaterally without rales, rhonchi, or wheezing. CARDIO: Regular rate and rhythm with no murmurs, rubs, or gallops, peripheral pulses intact. ABDOMEN: Abdomen soft, non-distended, active bowel sounds in all four quadrants, non-tender, no rebound tenderness, no mass appreciated. RECTAL: Declines. MUSCULOSKELETAL: Full range of motion, normal gait, without  edema. SKIN: Dry, intact without rashes or lesions. No jaundice. NEURO: Alert, oriented, no focal deficits. PSYCH: Cooperative, normal mood and affect.      Alan JONELLE Coombs, PA-C 10:51 AM

## 2024-01-08 ENCOUNTER — Ambulatory Visit (INDEPENDENT_AMBULATORY_CARE_PROVIDER_SITE_OTHER): Admitting: Physician Assistant

## 2024-01-08 ENCOUNTER — Encounter: Payer: Self-pay | Admitting: Physician Assistant

## 2024-01-08 ENCOUNTER — Other Ambulatory Visit (INDEPENDENT_AMBULATORY_CARE_PROVIDER_SITE_OTHER)

## 2024-01-08 VITALS — BP 118/68 | HR 55 | Ht 61.0 in | Wt 164.0 lb

## 2024-01-08 DIAGNOSIS — R197 Diarrhea, unspecified: Secondary | ICD-10-CM | POA: Diagnosis not present

## 2024-01-08 DIAGNOSIS — K219 Gastro-esophageal reflux disease without esophagitis: Secondary | ICD-10-CM | POA: Diagnosis not present

## 2024-01-08 DIAGNOSIS — Z1211 Encounter for screening for malignant neoplasm of colon: Secondary | ICD-10-CM

## 2024-01-08 DIAGNOSIS — R1310 Dysphagia, unspecified: Secondary | ICD-10-CM

## 2024-01-08 LAB — COMPREHENSIVE METABOLIC PANEL WITH GFR
ALT: 13 U/L (ref 0–35)
AST: 16 U/L (ref 0–37)
Albumin: 4.1 g/dL (ref 3.5–5.2)
Alkaline Phosphatase: 61 U/L (ref 39–117)
BUN: 9 mg/dL (ref 6–23)
CO2: 26 meq/L (ref 19–32)
Calcium: 10.4 mg/dL (ref 8.4–10.5)
Chloride: 105 meq/L (ref 96–112)
Creatinine, Ser: 0.66 mg/dL (ref 0.40–1.20)
GFR: 91.24 mL/min (ref >60.00)
Glucose, Bld: 92 mg/dL (ref 70–99)
Potassium: 3.9 meq/L (ref 3.5–5.1)
Sodium: 138 meq/L (ref 135–145)
Total Bilirubin: 0.6 mg/dL (ref 0.2–1.2)
Total Protein: 6.9 g/dL (ref 6.0–8.3)

## 2024-01-08 LAB — CBC WITH DIFFERENTIAL/PLATELET
Basophils Absolute: 0 K/uL (ref 0.0–0.1)
Basophils Relative: 0.5 % (ref 0.0–3.0)
Eosinophils Absolute: 0.1 K/uL (ref 0.0–0.7)
Eosinophils Relative: 1.3 % (ref 0.0–5.0)
HCT: 41 % (ref 36.0–46.0)
Hemoglobin: 13.4 g/dL (ref 12.0–15.0)
Lymphocytes Relative: 35.2 % (ref 12.0–46.0)
Lymphs Abs: 1.9 K/uL (ref 0.7–4.0)
MCHC: 32.8 g/dL (ref 30.0–36.0)
MCV: 92.1 fl (ref 78.0–100.0)
Monocytes Absolute: 0.4 K/uL (ref 0.1–1.0)
Monocytes Relative: 7.2 % (ref 3.0–12.0)
Neutro Abs: 3.1 K/uL (ref 1.4–7.7)
Neutrophils Relative %: 55.8 % (ref 43.0–77.0)
Platelets: 292 K/uL (ref 150.0–400.0)
RBC: 4.45 Mil/uL (ref 3.87–5.11)
RDW: 13.7 % (ref 11.5–15.5)
WBC: 5.5 K/uL (ref 4.0–10.5)

## 2024-01-08 LAB — SEDIMENTATION RATE: Sed Rate: 10 mm/h (ref 0–30)

## 2024-01-08 LAB — C-REACTIVE PROTEIN: CRP: <1 mg/dL (ref 0.5–20.0)

## 2024-01-08 LAB — TSH: TSH: 0.79 u[IU]/mL (ref 0.35–5.50)

## 2024-01-08 MED ORDER — PLENVU 140 G PO SOLR: 1.0000 | Freq: Once | ORAL | 0 refills | Status: AC

## 2024-01-08 NOTE — Patient Instructions (Addendum)
 Your provider has requested that you go to the basement level for lab work before leaving today. Press B on the elevator. The lab is located at the first door on the left as you exit the elevator.  Due to recent changes in healthcare laws, you may see the results of your imaging and laboratory studies on MyChart before your provider has had a chance to review them.  We understand that in some cases there may be results that are confusing or concerning to you. Not all laboratory results come back in the same time frame and the provider may be waiting for multiple results in order to interpret others.  Please give us  48 hours in order for your provider to thoroughly review all the results before contacting the office for clarification of your results.    We are ordering a Diatherix stool testing for you to take home and complete.  You have received a kit from our office today containing all necessary supplies to complete this test.  Please carefully read the stool collection instructions provided in the kit before opening the accompanying materials  OR an easier way is to please use toilet paper to wipe after your bowel movement and use the qtip/applicator provided to get a small volume of the stool from the toilet paper and place that in the tube.   Important to remember: -Place the label onto the puritan opti-swab TUBE. -This label should include your full name and date of birth.  - This label should have the DATE AND TIME stool was collects -After completing the test, you should secure the tube into the specimen biohazard bag.  -The laboratory request information sheet (including date and time of specimen collection) should be placed into the outside pocket of the specimen biohazard bag and returned to the [facility] lab with 2 days of collection.   If it is greater than two days from collection you will be asked to repeat the test.  If the label is missing from the tube with your name, date of  birth, date and time of collection on it, you will have to repeat the test.  Any questions please message us  on my chart or call the office at [phone]    FODMAP stands for fermentable oligo-, di-, mono-saccharides and polyols (1). These are the scientific terms used to classify groups of carbs that are difficult for our body to digest and that are notorious for triggering digestive symptoms like bloating, gas, loose stools and stomach pain.   You can try low FODMAP diet  - start with eliminating just one column at a time that you feel may be a trigger for you. - the table at the very bottom contains foods that are low in FODMAPs   Sometimes trying to eliminate the FODMAP's from your diet is difficult or tricky, if you are stuggling with trying to do the elimination diet you can try an enzyme.  There is a food enzymes that you sprinkle in or on your food that helps break down the FODMAP. You can read more about the enzyme by going to this site: https://fodzyme.com/   You have been scheduled for an endoscopy and colonoscopy. Please follow the written instructions given to you at your visit today. We have sent Plenvu  to your local pharmacy, as preferred level 1 with your insurance   If you use inhalers (even only as needed), please bring them with you on the day of your procedure.  DO NOT TAKE 7  DAYS PRIOR TO TEST- Trulicity (dulaglutide) Ozempic, Wegovy (semaglutide) Mounjaro (tirzepatide) Bydureon Bcise (exanatide extended release)  DO NOT TAKE 1 DAY PRIOR TO YOUR TEST Rybelsus (semaglutide) Adlyxin (lixisenatide) Victoza (liraglutide) Byetta (exanatide) ___________________________________________________________________________   I appreciate the  opportunity to care for you  Thank You   [place]

## 2024-01-09 ENCOUNTER — Telehealth: Payer: Self-pay | Admitting: *Deleted

## 2024-01-09 ENCOUNTER — Ambulatory Visit: Payer: Self-pay | Admitting: Physician Assistant

## 2024-01-09 NOTE — Telephone Encounter (Signed)
 Heyyyy. I got a call from this patient and she wanted to confirm that she has Suprep and she is going to stick with that one. She could not find the expiration date and said that she doesn't want to pay for another medication. She would need new instructions. - Shonpryl    Noted-    Patient had a colonoscopy prep from a precious colon she had cancelled last year . I had told her in the office that if the expire date was still good she could use it instead of getting the new prep.  It is from 02/2023.   Ok to use the prep she has on hand, per patient she is not buying another [employer]

## 2024-01-10 ENCOUNTER — Other Ambulatory Visit

## 2024-01-10 DIAGNOSIS — R197 Diarrhea, unspecified: Secondary | ICD-10-CM

## 2024-01-10 LAB — TISSUE TRANSGLUTAMINASE, IGA: (tTG) Ab, IgA: <1 U/mL

## 2024-01-10 LAB — IGA: Immunoglobulin A: 81 mg/dL (ref 70–320)

## 2024-01-16 ENCOUNTER — Telehealth: Payer: Self-pay | Admitting: Physician Assistant

## 2024-01-16 NOTE — Telephone Encounter (Signed)
 Negative Diatherix stool

## 2024-01-24 ENCOUNTER — Ambulatory Visit: Payer: Medicare Other

## 2024-01-24 VITALS — BP 98/70 | HR 54 | Ht 62.0 in | Wt 160.8 lb

## 2024-01-24 DIAGNOSIS — Z0000 Encounter for general adult medical examination without abnormal findings: Secondary | ICD-10-CM | POA: Diagnosis not present

## 2024-01-24 NOTE — Progress Notes (Signed)
 Subjective:   Brittany [name] is a 68 y.o. who presents for a Medicare Wellness preventive visit.  As a reminder, Annual Wellness Visits don't include a physical exam, and some assessments may be limited, especially if this visit is performed virtually. We may recommend an in-person follow-up visit with your provider if needed.  Visit Complete: In person  VideoDeclined- This patient declined Interactive audio and Acupuncturist. Therefore the visit was completed with audio only.  Persons Participating in Visit: Patient.  AWV Questionnaire: No: Patient Medicare AWV questionnaire was not completed prior to this visit.  Cardiac Risk Factors include: advanced age (>5men, >9 women);Other (see comment);dyslipidemia, Risk factor comments: OSA     Objective:    Today's Vitals   01/24/24 1305 01/24/24 1319  BP: 98/70   Pulse: (!) 54   SpO2: 99%   Weight: 160 lb 12.8 oz (72.9 kg) 160 lb 12.8 oz (72.9 kg)  PainSc: 8     Body mass index is 30.38 kg/m.     01/24/2024    1:24 PM 05/31/2023    1:28 PM 01/22/2023   10:05 AM 02/01/2022    8:23 PM 01/24/2017    9:00 AM 05/05/2015    2:21 PM 04/20/2015    1:28 PM  Advanced Directives  Does Patient Have a Medical Advance Directive? Yes Yes Yes Yes No;Yes  Yes  No   Type of Estate agent of [place];Living will Healthcare Power of [place];Living will Healthcare Power of [place];Living will Healthcare Power of [place];Living will Healthcare Power of [place];Living will Living will;Healthcare Power of Attorney    Does patient want to make changes to medical advance directive?  No - Patient declined  No - Patient declined     Copy of Healthcare Power of Attorney in Chart? No - copy requested No - copy requested No - copy requested No - copy requested No - copy requested  Yes       Data saved with a previous flowsheet row definition    Current Medications (verified) Outpatient Encounter Medications as of  01/24/2024  Medication Sig   Coenzyme Q10 (Q-10 CO-ENZYME PO) Take by mouth.   diclofenac  sodium (VOLTAREN ) 1 % GEL Apply 2 g topically 4 (four) times daily.   meloxicam  (MOBIC ) 15 MG tablet Take 1 tablet (15 mg total) by mouth daily.   Multiple Vitamin (MULTIVITAMIN WITH MINERALS) TABS Take 1 tablet by mouth daily.   saline (AYR) GEL Place 1 Application into both nostrils every 6 (six) hours as needed.   tiZANidine (ZANAFLEX) 2 MG tablet    No facility-administered encounter medications on file as of 01/24/2024.    Allergies (verified) Flexeril  [cyclobenzaprine ], Hydrocodone , Motrin  [ibuprofen ], Nsaids, Prednisone, and Robaxin  [methocarbamol ]   History: Past Medical History:  Diagnosis Date   ACNE NEC    ALLERGIC RHINITIS    ALOPECIA    Anal fissure    Anxiety    Panic Attack.  None in a long time   Arthritis    Asymptomatic microscopic hematuria 12/05/2022   Bowel obstruction (HCC)    Breast mass 12/05/2022   Sep 04, 2008 Entered By: MEMORY GULL B Comment: benign on bx   Chronic headaches    migraine- none in years   Constipation    drinks tea with senna   DEPRESSION    Depression    DISORDER, BIPOLAR NEC    Family history of breast cancer    Family history of pancreatic cancer    GERD (gastroesophageal reflux disease)  HEMORRHOIDS-INTERNAL    History of stress test    ETT 10/17: Ex 7'; no chest pain, no ST changes, [facility] Treadmill Score 7   HYPERLIPIDEMIA    OBSTRUCTIVE SLEEP APNEA    Seasonal allergies    Past Surgical History:  Procedure Laterality Date   BREAST LUMPECTOMY Right last done 2005   x 2   CHOLECYSTECTOMY N/A 03/07/2014   Procedure: LAPAROSCOPIC CHOLECYSTECTOMY WITH INTRAOPERATIVE CHOLANGIOGRAM;  Surgeon: Elon Pacini, MD;  Location: [facility];  Service: General;  Laterality: N/A;   EUS N/A 01/27/2014   Procedure: ESOPHAGEAL ENDOSCOPIC ULTRASOUND (EUS) RADIAL;  Surgeon: Elsie Cree, MD;  Location: [facility];  Service: Endoscopy;  Laterality: N/A;    EYE SURGERY  08/2021   Cataract surgey   HAND SURGERY Bilateral    for arthritis  left 02/02/14- right approx 2014   KNEE SURGERY Bilateral    right x 2 left x 1.  arthroscopy   RADICAL HYSTERECTOMY     thumb and pinkie finger surgery Right 06/19/2012   TONSILLECTOMY     Family History  Problem Relation Age of Onset   Other Paternal Aunt        x 2, blood clotting disorder   Breast cancer Paternal Aunt 66   Heart disease Sister    Kidney disease Maternal Uncle    Esophageal cancer Paternal Uncle    Pancreatic cancer Paternal Uncle    COPD Father    Breast cancer Mother 65   Breast cancer Maternal Grandmother 45   Stroke Maternal Grandfather    Aneurysm Paternal Grandmother        brain   Hypertension Brother    Breast cancer Maternal Aunt        dx in her 3s   Breast cancer Maternal Aunt        dx in her 58s   Cancer Maternal Uncle        NOS   Cancer Maternal Uncle        tumor on his head   Breast cancer Paternal Aunt        dx in her 47s   Breast cancer Paternal Aunt        dx in her 46s   Lung cancer Paternal Uncle    Breast cancer Cousin 13       paternal first cousin   Colon cancer Maternal Uncle    Colon polyps Maternal Uncle    Irritable bowel syndrome Cousin    Rectal cancer Neg Hx    Stomach cancer Neg Hx    Social History   Socioeconomic History   Marital status: Widowed    Spouse name: Not on file   Number of children: 4   Years of education: Not on file   Highest education level: Not on file  Occupational History   Occupation: disabled    Employer: UNEMPLOYED  Tobacco Use   Smoking status: Former    Current packs/day: 0.00    Average packs/day: 0.1 packs/day for 7.0 years (0.7 ttl pk-yrs)    Types: Cigarettes    Start date: 06/19/1974    Quit date: 06/19/1981    Years since quitting: 42.6   Smokeless tobacco: Never  Vaping Use   Vaping status: Never Used  Substance and Sexual Activity   Alcohol use: No    Comment: rare   Drug use: No    Sexual activity: Not on file  Other Topics Concern   Not on file  Social History Narrative  Lives alone/2025   Social Drivers of Health   Financial Resource Strain: Low Risk  (01/24/2024)   Overall Financial Resource Strain (CARDIA)    Difficulty of Paying Living Expenses: Not hard at all  Food Insecurity: No Food Insecurity (01/24/2024)   Hunger Vital Sign    Worried About Running Out of Food in the Last Year: Never true    Ran Out of Food in the Last Year: Never true  Transportation Needs: No Transportation Needs (01/24/2024)   PRAPARE - Administrator, Civil Service (Medical): No    Lack of Transportation (Non-Medical): No  Physical Activity: Insufficiently Active (01/24/2024)   Exercise Vital Sign    Days of Exercise per Week: 7 days    Minutes of Exercise per Session: 20 min  Stress: No Stress Concern Present (01/24/2024)   [employer] of Occupational Health - Occupational Stress Questionnaire    Feeling of Stress: Not at all  Social Connections: Moderately Integrated (01/24/2024)   Social Connection and Isolation Panel    Frequency of Communication with Friends and Family: More than three times a week    Frequency of Social Gatherings with Friends and Family: Once a week    Attends Religious Services: More than 4 times per year    Active Member of [employer] or Organizations: Yes    Attends Banker Meetings: Never    Marital Status: Widowed    Tobacco Counseling Counseling given: Not Answered    Clinical Intake:  Pre-visit preparation completed: Yes  Pain : 0-10 Pain Score: 8  Pain Type: Chronic pain Pain Location: Back Pain Orientation: Lower Pain Descriptors / Indicators: Aching, Discomfort Pain Onset: More than a month ago Pain Frequency: Constant Pain Relieving Factors: PT/ Patches/melaxicam  Pain Relieving Factors: PT/ Patches/melaxicam  BMI - recorded: 30.38 Nutritional Status: BMI > 30  Obese Diabetes: No  Lab Results   Component Value Date   HGBA1C 5.5 10/21/2012     How often do you need to have someone help you when you read instructions, pamphlets, or other written materials from your doctor or pharmacy?: 1 - Never     Information entered by :: Taedyn Glasscock, RMA   Activities of Daily Living     01/24/2024    1:06 PM  In your present state of health, do you have any difficulty performing the following activities:  Hearing? 1  Comment has some hearing issues  Vision? 0  Difficulty concentrating or making decisions? 0  Walking or climbing stairs? 0  Dressing or bathing? 0  Doing errands, shopping? 0  Preparing Food and eating ? N  Using the Toilet? N  In the past six months, have you accidently leaked urine? Y  Do you have problems with loss of bowel control? N  Managing your Medications? N  Managing your Finances? N  Housekeeping or managing your Housekeeping? N    Patient Care Team: Lendia Boby CROME, NP-C as PCP - General (Family Medicine) Teresa Tinnie BROCKS, MD (Otolaryngology)  I have updated your Care Teams any recent Medical Services you may have received from other providers in the past year.     Assessment:   This is a routine wellness examination for Dandria.  Hearing/Vision screen Hearing Screening - Comments:: has some hearing issues Vision Screening - Comments:: Wears eyeglasses/   Goals Addressed   None    Depression Screen     01/24/2024    1:28 PM 04/03/2023    1:59 PM 01/22/2023  10:12 AM 12/11/2022   11:18 AM 12/05/2022    8:25 AM 01/24/2017    8:55 AM 04/20/2015    1:34 PM  PHQ 2/9 Scores  PHQ - 2 Score 0  1  2 1 6   PHQ- 9 Score 2  3   1 18      Information is confidential and restricted. Go to Review Flowsheets to unlock data.    Fall Risk     01/24/2024    1:25 PM 01/22/2023   10:05 AM 12/05/2022    8:25 AM 01/24/2017    8:55 AM 04/20/2015    1:34 PM  Fall Risk   Falls in the past year? 1 1 0 Yes  No   Number falls in past yr: 1 1 0 1    Injury with Fall?  0 1 0 Yes    Risk for fall due to : Impaired balance/gait Medication side effect;Impaired mobility No Fall Risks    Follow up Falls evaluation completed;Falls prevention discussed Falls evaluation completed;Falls prevention discussed Falls evaluation completed Falls prevention discussed       Data saved with a previous flowsheet row definition    MEDICARE RISK AT HOME:  Medicare Risk at Home Any stairs in or around the home?: Yes (2 steps going into home) If so, are there any without handrails?: No Home free of loose throw rugs in walkways, pet beds, electrical cords, etc?: Yes Adequate lighting in your home to reduce risk of falls?: Yes Life alert?: No Use of a cane, walker or w/c?: No Grab bars in the bathroom?: Yes Shower chair or bench in shower?: No Elevated toilet seat or a handicapped toilet?: No  TIMED UP AND GO:  Was the test performed?  Yes  Length of time to ambulate 10 feet: 15 sec Gait steady and fast without use of assistive device  Cognitive Function: Declined/Normal: No cognitive concerns noted by patient or family. Patient alert, oriented, able to answer questions appropriately and recall recent events. No signs of memory loss or confusion.        01/22/2023   10:07 AM  6CIT Screen  What Year? 0 points  What month? 0 points  What time? 0 points  Count back from 20 0 points  Months in reverse 0 points  Repeat phrase 0 points  Total Score 0 points    Immunizations Immunization History  Administered Date(s) Administered   Fluad Trivalent(High Dose 65+) 03/29/2023   Influenza Split 03/28/2016, 01/17/2017   Influenza,inj,Quad PF,6+ Mos 03/17/2017, 02/17/2018, 02/14/2019, 04/07/2022   Influenza,inj,quad, With Preservative 02/17/2020   Influenza-Unspecified 02/27/2020, 03/19/2021, 03/19/2022, 03/26/2023   Moderna Sars-Covid-2 Vaccination 09/05/2019, 04/11/2020, 10/06/2020   Pneumococcal-Unspecified 02/17/2019   Tdap 01/24/2012, 02/12/2012, 06/06/2017    Zoster Recombinant(Shingrix) 03/15/2018, 05/21/2018    Screening Tests Health Maintenance  Topic Date Due   Pneumococcal Vaccine: 50+ Years (1 of 1 - PCV) 05/03/2007   Colonoscopy  10/02/2021   INFLUENZA VACCINE  01/18/2024   Medicare Annual Wellness (AWV)  01/23/2025   MAMMOGRAM  06/09/2025   DTaP/Tdap/Td (4 - Td or Tdap) 06/07/2027   DEXA SCAN  Completed   Hepatitis C Screening  Completed   Zoster Vaccines- Shingrix  Completed   Hepatitis B Vaccines  Aged Out   HPV VACCINES  Aged Out   Meningococcal B Vaccine  Aged Out   COVID-19 Vaccine  Discontinued    Health Maintenance  Health Maintenance Due  Topic Date Due   Pneumococcal Vaccine: 50+ Years (1 of 1 -  PCV) 05/03/2007   Colonoscopy  10/02/2021   INFLUENZA VACCINE  01/18/2024   Health Maintenance Items Addressed: Mammogram scheduled, DEXA scheduled, See Nurse Notes at the end of this note  Additional Screening:  Vision Screening: Recommended annual ophthalmology exams for early detection of glaucoma and other disorders of the eye. Would you like a referral to an eye doctor? No    Dental Screening: Recommended annual dental exams for proper oral hygiene  Community Resource Referral / Chronic Care Management: CRR required this visit?  No   CCM required this visit?  No   Plan:    I have personally reviewed and noted the following in the patient's chart:   Medical and social history Use of alcohol, tobacco or illicit drugs  Current medications and supplements including opioid prescriptions. Patient is not currently taking opioid prescriptions. Functional ability and status Nutritional status Physical activity Advanced directives List of other physicians Hospitalizations, surgeries, and ER visits in previous 12 months Vitals Screenings to include cognitive, depression, and falls Referrals and appointments  In addition, I have reviewed and discussed with patient certain preventive protocols, quality  metrics, and best practice recommendations. A written personalized care plan for preventive services as well as general preventive health recommendations were provided to patient.   Acacia Latorre L Otha Monical, CMA   01/24/2024   After Visit Summary: (MyChart) Due to this being a telephonic visit, the after visit summary with patients personalized plan was offered to patient via MyChart   Notes: Patient is due for a pneumonia vaccine.   She is scheduled for her colonoscopy 03/07/2024 and scheduled for mammogram and DEXA on 03/06/2024.

## 2024-01-24 NOTE — Patient Instructions (Addendum)
 Brittany [name] , Thank you for taking time out of your busy schedule to complete your Annual Wellness Visit with me. I enjoyed our conversation and look forward to speaking with you again next year. I, as well as your care team,  appreciate your ongoing commitment to your health goals. Please review the following plan we discussed and let me know if I can assist you in the future. Your Game plan/ To Do List    Referrals: If you haven't heard from the office you've been referred to, please reach out to them at the phone provided.   Follow up Visits: We will see or speak with you next year for your Next Medicare AWV with our clinical staff Have you seen your provider in the last 6 months (3 months if uncontrolled diabetes)? No  Clinician Recommendations:  Aim for 30 minutes of exercise or brisk walking, 6-8 glasses of water, and 5 servings of fruits and vegetables each day. You are due for a pneumonia vaccine and can get that at your local pharmacy.        This is a list of the screenings recommended for you:  Health Maintenance  Topic Date Due   Pneumococcal Vaccine for age over 54 (1 of 1 - PCV) 05/03/2007   Colon Cancer Screening  10/02/2021   Medicare Annual Wellness Visit  01/22/2024   Flu Shot  01/18/2024   Mammogram  06/09/2025   DTaP/Tdap/Td vaccine (4 - Td or Tdap) 06/07/2027   DEXA scan (bone density measurement)  Completed   Hepatitis C Screening  Completed   Zoster (Shingles) Vaccine  Completed   Hepatitis B Vaccine  Aged Out   HPV Vaccine  Aged Out   Meningitis B Vaccine  Aged Out   COVID-19 Vaccine  Discontinued    Advanced directives: (Copy Requested) Please bring a copy of your health care power of attorney and living will to the office to be added to your chart at your convenience. You can mail to [facility] [address] [place], [place] [address] or email to [email or web address] Advance Care Planning is important because it:  [x]  Makes sure you  receive the medical care that is consistent with your values, goals, and preferences  [x]  It provides guidance to your family and loved ones and reduces their decisional burden about whether or not they are making the right decisions based on your wishes.  Follow the link provided in your after visit summary or read over the paperwork we have mailed to you to help you started getting your Advance Directives in place. If you need assistance in completing these, please reach out to us  so that we can help you!  See attachments for Preventive Care and Fall Prevention Tips.

## 2024-01-25 ENCOUNTER — Encounter: Payer: Self-pay | Admitting: Physician Assistant

## 2024-02-01 ENCOUNTER — Encounter: Admitting: Pediatrics

## 2024-03-03 ENCOUNTER — Other Ambulatory Visit: Payer: Self-pay

## 2024-03-03 DIAGNOSIS — Z01818 Encounter for other preprocedural examination: Secondary | ICD-10-CM

## 2024-03-04 NOTE — Progress Notes (Signed)
 [facility] History and Physical   Primary Care Physician:  Lendia Boby CROME, NP-C   Reason for Procedure:  GERD, nausea, dysphagia, diarrhea and colorectal cancer screening  Plan:    Upper endoscopy with possible dilation and colonoscopy     HPI: Brittany [name] is a 68 y.o. female undergoing upper endoscopy for investigation of GERD, nausea, dysphagia and colonoscopy for colorectal cancer screening and diarrhea.  Patient reports a history of GERD and intermittent dysphagia.  Attributes this to eating too quickly.  Prior history of nausea but no vomiting.  Has had a history of E. coli infection treated with antibiotics in 2024.  Noted recurrent symptoms of diarrhea at the time of clinic visit in July 2022.  Stool studies negative for enteric pathogens.  Last colonoscopy was performed in 2013 and showed a hyperplastic polyp.  There is a family history of colorectal cancer in maternal uncle.   Past Medical History:  Diagnosis Date   ACNE NEC    ALLERGIC RHINITIS    ALOPECIA    Anal fissure    Anxiety    Panic Attack.  None in a long time   Arthritis    Asymptomatic microscopic hematuria 12/05/2022   Bowel obstruction (HCC)    Breast mass 12/05/2022   Sep 04, 2008 Entered By: MEMORY GULL B Comment: benign on bx   Chronic headaches    migraine- none in years   Constipation    drinks tea with senna   DEPRESSION    Depression    DISORDER, BIPOLAR NEC    Family history of breast cancer    Family history of pancreatic cancer    GERD (gastroesophageal reflux disease)    HEMORRHOIDS-INTERNAL    History of stress test    ETT 10/17: Ex 7'; no chest pain, no ST changes, [facility] Treadmill Score 7   HYPERLIPIDEMIA    OBSTRUCTIVE SLEEP APNEA    Seasonal allergies     Past Surgical History:  Procedure Laterality Date   BREAST LUMPECTOMY Right last done 2005   x 2   CHOLECYSTECTOMY N/A 03/07/2014   Procedure: LAPAROSCOPIC CHOLECYSTECTOMY WITH INTRAOPERATIVE CHOLANGIOGRAM;   Surgeon: Elon Pacini, MD;  Location: [facility] OR;  Service: General;  Laterality: N/A;   EUS N/A 01/27/2014   Procedure: ESOPHAGEAL ENDOSCOPIC ULTRASOUND (EUS) RADIAL;  Surgeon: Elsie Cree, MD;  Location: [facility];  Service: Endoscopy;  Laterality: N/A;   EYE SURGERY  08/2021   Cataract surgey   HAND SURGERY Bilateral    for arthritis  left 02/02/14- right approx 2014   KNEE SURGERY Bilateral    right x 2 left x 1.  arthroscopy   RADICAL HYSTERECTOMY     thumb and pinkie finger surgery Right 06/19/2012   TONSILLECTOMY      Prior to Admission medications   Medication Sig Start Date End Date Taking? Authorizing Provider  Coenzyme Q10 (Q-10 CO-ENZYME PO) Take by mouth.    [provider]  diclofenac  sodium (VOLTAREN ) 1 % GEL Apply 2 g topically 4 (four) times daily. 06/24/18   Dragnev, Romualdo Capers, NP  meloxicam  (MOBIC ) 15 MG tablet Take 1 tablet (15 mg total) by mouth daily. 06/24/18   Kathlyne Romualdo Capers, NP  Multiple Vitamin (MULTIVITAMIN WITH MINERALS) TABS Take 1 tablet by mouth daily.    [provider]  saline (AYR) GEL Place 1 Application into both nostrils every 6 (six) hours as needed. 02/01/22   McQuaid, Douglas B, MD  tiZANidine (ZANAFLEX) 2 MG tablet  07/01/18  [provider]    Current Outpatient Medications  Medication Sig Dispense Refill   Coenzyme Q10 (Q-10 CO-ENZYME PO) Take by mouth.     diclofenac  sodium (VOLTAREN ) 1 % GEL Apply 2 g topically 4 (four) times daily. 100 g 1   FT ASHWAGANDHA EXTRACT PO      lidocaine  (LIDODERM ) 5 % Place 1 patch onto the skin daily.     Multiple Vitamin (MULTIVITAMIN WITH MINERALS) TABS Take 1 tablet by mouth daily.     VALERIAN ROOT PO      meloxicam  (MOBIC ) 15 MG tablet Take 1 tablet (15 mg total) by mouth daily. 30 tablet 1   saline (AYR) GEL Place 1 Application into both nostrils every 6 (six) hours as needed. 42.3 g 0   tiZANidine (ZANAFLEX) 2 MG tablet      Current Facility-Administered  Medications  Medication Dose Route Frequency Provider Last Rate Last Admin   0.9 %  sodium chloride  infusion  500 mL Intravenous Once Sheronica Corey M, MD        Allergies as of 03/07/2024 - Review Complete 03/07/2024  Allergen Reaction Noted   Flexeril  [cyclobenzaprine ] Other (See Comments) 08/19/2012   Nsaids Other (See Comments) 12/05/2022   Hydrocodone  Nausea Only 03/05/2009   Motrin  [ibuprofen ] Nausea Only 06/24/2018   Prednisone Other (See Comments) 11/18/2020   Robaxin  [methocarbamol ] Other (See Comments) 06/24/2018    Family History  Problem Relation Age of Onset   Other Paternal Aunt        x 2, blood clotting disorder   Breast cancer Paternal Aunt 65   Heart disease Sister    Kidney disease Maternal Uncle    Esophageal cancer Paternal Uncle    Pancreatic cancer Paternal Uncle    COPD Father    Breast cancer Mother 72   Breast cancer Maternal Grandmother 45   Stroke Maternal Grandfather    Aneurysm Paternal Grandmother        brain   Hypertension Brother    Breast cancer Maternal Aunt        dx in her 56s   Breast cancer Maternal Aunt        dx in her 63s   Cancer Maternal Uncle        NOS   Cancer Maternal Uncle        tumor on his head   Breast cancer Paternal Aunt        dx in her 84s   Breast cancer Paternal Aunt        dx in her 5s   Lung cancer Paternal Uncle    Breast cancer Cousin 18       paternal first cousin   Colon cancer Maternal Uncle    Colon polyps Maternal Uncle    Irritable bowel syndrome Cousin    Rectal cancer Neg Hx    Stomach cancer Neg Hx     Social History   Socioeconomic History   Marital status: Widowed    Spouse name: Not on file   Number of children: 4   Years of education: Not on file   Highest education level: Not on file  Occupational History   Occupation: disabled    Employer: UNEMPLOYED  Tobacco Use   Smoking status: Former    Current packs/day: 0.00    Average packs/day: 0.1 packs/day for 7.0 years (0.7 ttl  pk-yrs)    Types: Cigarettes    Start date: 06/19/1974    Quit date: 06/19/1981    Years  since quitting: 42.7   Smokeless tobacco: Never  Vaping Use   Vaping status: Never Used  Substance and Sexual Activity   Alcohol use: No    Comment: rare   Drug use: No   Sexual activity: Not on file  Other Topics Concern   Not on file  Social History Narrative   Lives alone/2025   Social Drivers of Health   Financial Resource Strain: Low Risk  (01/24/2024)   Overall Financial Resource Strain (CARDIA)    Difficulty of Paying Living Expenses: Not hard at all  Food Insecurity: No Food Insecurity (01/24/2024)   Hunger Vital Sign    Worried About Running Out of Food in the Last Year: Never true    Ran Out of Food in the Last Year: Never true  Transportation Needs: No Transportation Needs (01/24/2024)   PRAPARE - Administrator, Civil Service (Medical): No    Lack of Transportation (Non-Medical): No  Physical Activity: Insufficiently Active (01/24/2024)   Exercise Vital Sign    Days of Exercise per Week: 7 days    Minutes of Exercise per Session: 20 min  Stress: No Stress Concern Present (01/24/2024)   [employer] of Occupational Health - Occupational Stress Questionnaire    Feeling of Stress: Not at all  Social Connections: Moderately Integrated (01/24/2024)   Social Connection and Isolation Panel    Frequency of Communication with Friends and Family: More than three times a week    Frequency of Social Gatherings with Friends and Family: Once a week    Attends Religious Services: More than 4 times per year    Active Member of [employer] or Organizations: Yes    Attends Banker Meetings: Never    Marital Status: Widowed  Intimate Partner Violence: Patient Unable To Answer (01/24/2024)   Humiliation, Afraid, Rape, and Kick questionnaire    Fear of Current or Ex-Partner: Patient unable to answer    Emotionally Abused: Patient unable to answer    Physically Abused: Patient unable  to answer    Sexually Abused: Patient unable to answer    Review of Systems:  All other review of systems negative except as mentioned in the HPI.  Physical Exam: Vital signs BP 114/66   Pulse (!) 49   Temp (!) 97.5 F (36.4 C) (Skin)   Resp 16   Ht 5' 1 (1.549 m)   Wt 164 lb (74.4 kg)   SpO2 100%   BMI 30.99 kg/m   General:   Alert,  Well-developed, well-nourished, pleasant and cooperative in NAD Airway:  Mallampati 2 Lungs:  Clear throughout to auscultation.   Heart:  Regular rate and rhythm; no murmurs, clicks, rubs,  or gallops. Abdomen:  Soft, nontender and nondistended. Normal bowel sounds.   Neuro/Psych:  Normal mood and affect. A and O x 3  Inocente Hausen, MD [facility]

## 2024-03-07 ENCOUNTER — Encounter: Payer: Self-pay | Admitting: Pediatrics

## 2024-03-07 ENCOUNTER — Ambulatory Visit: Admitting: Pediatrics

## 2024-03-07 VITALS — BP 104/63 | HR 53 | Temp 97.5°F | Resp 12 | Ht 61.0 in | Wt 164.0 lb

## 2024-03-07 DIAGNOSIS — K219 Gastro-esophageal reflux disease without esophagitis: Secondary | ICD-10-CM

## 2024-03-07 DIAGNOSIS — K644 Residual hemorrhoidal skin tags: Secondary | ICD-10-CM

## 2024-03-07 DIAGNOSIS — K2950 Unspecified chronic gastritis without bleeding: Secondary | ICD-10-CM | POA: Diagnosis not present

## 2024-03-07 DIAGNOSIS — R197 Diarrhea, unspecified: Secondary | ICD-10-CM | POA: Diagnosis not present

## 2024-03-07 DIAGNOSIS — K449 Diaphragmatic hernia without obstruction or gangrene: Secondary | ICD-10-CM

## 2024-03-07 DIAGNOSIS — Z1211 Encounter for screening for malignant neoplasm of colon: Secondary | ICD-10-CM

## 2024-03-07 DIAGNOSIS — K648 Other hemorrhoids: Secondary | ICD-10-CM

## 2024-03-07 DIAGNOSIS — R1310 Dysphagia, unspecified: Secondary | ICD-10-CM | POA: Diagnosis not present

## 2024-03-07 DIAGNOSIS — R110 Nausea: Secondary | ICD-10-CM

## 2024-03-07 MED ORDER — SODIUM CHLORIDE 0.9 % IV SOLN: 500.0000 mL | Freq: Once | INTRAVENOUS | Status: DC

## 2024-03-07 NOTE — Progress Notes (Signed)
1002 Robinul 0.1 mg IV given due large amount of secretions upon assessment.  MD made aware, vss 

## 2024-03-07 NOTE — Progress Notes (Signed)
 Called to room to assist during endoscopic procedure.  Patient ID and intended procedure confirmed with present staff. Received instructions for my participation in the procedure from the performing physician.

## 2024-03-07 NOTE — Progress Notes (Signed)
 Pt's states no medical or surgical changes since previsit or office visit.

## 2024-03-07 NOTE — Progress Notes (Signed)
 This encounter was created in error - please disregard.

## 2024-03-07 NOTE — Op Note (Signed)
 [facility] Patient Name: Brittany [name] Procedure Date: 03/07/2024 9:50 AM MRN: [id number] Endoscopist: Inocente Hausen , MD, [id number] Age: 68 Referring MD:  Date of Birth: Oct 25, 1955 Gender: Female Account #: [id number] Procedure:                Upper GI endoscopy Indications:              Dysphagia, Follow-up of gastro-esophageal reflux                            disease, Diarrhea, Nausea Medicines:                Monitored Anesthesia Care Procedure:                Pre-Anesthesia Assessment:                           - Prior to the procedure, a History and Physical                            was performed, and patient medications and                            allergies were reviewed. The patient's tolerance of                            previous anesthesia was also reviewed. The risks                            and benefits of the procedure and the sedation                            options and risks were discussed with the patient.                            All questions were answered, and informed consent                            was obtained. Prior Anticoagulants: The patient has                            taken no anticoagulant or antiplatelet agents. ASA                            Grade Assessment: II - A patient with mild systemic                            disease. After reviewing the risks and benefits,                            the patient was deemed in satisfactory condition to                            undergo the procedure.  After obtaining informed consent, the endoscope was                            passed under direct vision. Throughout the                            procedure, the patient's blood pressure, pulse, and                            oxygen saturations were monitored continuously. The                            GIF [id number] #[id number] was introduced through the                            mouth, and advanced to the  second part of duodenum.                            The upper GI endoscopy was accomplished without                            difficulty. The patient tolerated the procedure                            well. Scope In: Scope Out: Findings:                 No endoscopic abnormality was evident in the                            esophagus to explain the patient's complaint of                            dysphagia. It was decided, however, to proceed with                            dilation of the entire esophagus. A guidewire was                            placed and the scope was withdrawn. Dilation was                            performed with a Savary dilator with no resistance                            at 16 mm and 17 mm.                           Localized mild inflammation characterized by                            congestion (edema), erosions and erythema was found  in the prepyloric region of the stomach. Biopsies                            were taken with a cold forceps for Helicobacter                            pylori testing.                           The gastric body, gastric antrum, cardia (on                            retroflexion) and gastric fundus (on retroflexion)                            were normal. Biopsies were taken with a cold                            forceps for Helicobacter pylori testing.                           A small hiatal hernia was present.                           The duodenal bulb and second portion of the                            duodenum were normal. Biopsies for histology were                            taken with a cold forceps for evaluation of celiac                            disease. Complications:            No immediate complications. Estimated blood loss:                            Minimal. Estimated Blood Loss:     Estimated blood loss was minimal. Impression:               - No endoscopic esophageal  abnormality to explain                            patient's dysphagia. Esophagus dilated. Dilated.                           - Gastritis. Biopsied.                           - Normal gastric body, antrum, cardia and gastric                            fundus. Biopsied.                           -  Small hiatal hernia.                           - Normal duodenal bulb and second portion of the                            duodenum. Biopsied. Recommendation:           - Await pathology results.                           - Perform a colonoscopy today.                           - The findings and recommendations were discussed                            with the patient. Inocente Hausen, MD 03/07/2024 10:36:54 AM This report has been signed electronically.

## 2024-03-07 NOTE — Progress Notes (Signed)
 Report given to PACU, vss

## 2024-03-07 NOTE — Op Note (Signed)
 [facility] Patient Name: Brittany [name] Procedure Date: 03/07/2024 9:49 AM MRN: [id number] Endoscopist: Inocente Hausen , MD, [id number] Age: 68 Referring MD:  Date of Birth: 11-19-1955 Gender: Female Account #: [id number] Procedure:                Colonoscopy Indications:              Screening for colorectal malignant neoplasm, Last                            colonoscopy: 2013, Incidental diarrhea noted Medicines:                Monitored Anesthesia Care Procedure:                Pre-Anesthesia Assessment:                           - Prior to the procedure, a History and Physical                            was performed, and patient medications and                            allergies were reviewed. The patient's tolerance of                            previous anesthesia was also reviewed. The risks                            and benefits of the procedure and the sedation                            options and risks were discussed with the patient.                            All questions were answered, and informed consent                            was obtained. Prior Anticoagulants: The patient has                            taken no anticoagulant or antiplatelet agents. ASA                            Grade Assessment: II - A patient with mild systemic                            disease. After reviewing the risks and benefits,                            the patient was deemed in satisfactory condition to                            undergo the procedure.  After obtaining informed consent, the colonoscope                            was passed under direct vision. Throughout the                            procedure, the patient's blood pressure, pulse, and                            oxygen saturations were monitored continuously. The                            Olympus Scope SN [id number] was introduced through the                            anus and  advanced to the cecum, identified by                            appendiceal orifice and ileocecal valve. The                            colonoscopy was performed without difficulty. The                            patient tolerated the procedure well. The quality                            of the bowel preparation was fair. The ileocecal                            valve, appendiceal orifice, and rectum were                            photographed. Scope In: 10:18:18 AM Scope Out: 10:34:16 AM Scope Withdrawal Time: 0 hours 11 minutes 23 seconds  Total Procedure Duration: 0 hours 15 minutes 58 seconds  Findings:                 Hemorrhoids were found on perianal exam.                           The digital rectal exam was normal. Pertinent                            negatives include normal sphincter tone and no                            palpable rectal lesions.                           Scattered stool, fibrous material and seeds are                            present in the cecum, descending and sigmoid colon  interfering with visualization. Lavage was                            performed with a large amount of sterile water                            resulting in clearance with fair visualization in                            these areas. No mass lesions or large polyps were                            seen. Small polyps could have been obscured by                            retained material.                           Normal mucosa was found in the entire colon.                            Biopsies for histology were taken with a cold                            forceps for evaluation of microscopic colitis.                           Terminal ileum was unable to be intubated due to                            looping of the colonoscope.                           Internal hemorrhoids were found during retroflexion. Complications:            No immediate  complications. Estimated blood loss:                            Minimal. Estimated Blood Loss:     Estimated blood loss was minimal. Impression:               - Preparation of the colon was fair.                           - Hemorrhoids found on perianal exam.                           - Normal mucosa in the entire examined colon.                            Biopsied.                           - Internal hemorrhoids. Recommendation:           - Discharge patient to home (ambulatory).                           -  Await pathology results.                           - Repeat colonoscopy in 5 years for screening                            purposes due to fair bowel preparation at the time                            of today's exam. Areas in the cecum and ascending                            colon were obscured by retained material that may                            have limited visualization of small polyps.                           - The findings and recommendations were discussed                            with the patient.                           - Return to GI clinic in 6 to 8 weeks with Dr.                            Suzann or APP Victoria, May)                           - Patient has a contact number available for                            emergencies. The signs and symptoms of potential                            delayed complications were discussed with the                            patient. Return to normal activities tomorrow.                            Written discharge instructions were provided to the                            patient. Inocente Suzann, MD 03/07/2024 10:42:26 AM This report has been signed electronically.

## 2024-03-07 NOTE — Patient Instructions (Addendum)
 YOU HAD AN ENDOSCOPIC PROCEDURE TODAY AT THE [facility]:   Refer to the procedure report that was given to you for any specific questions about what was found during the examination.  If the procedure report does not answer your questions, please call your gastroenterologist to clarify.  If you requested that your care partner not be given the details of your procedure findings, then the procedure report has been included in a sealed envelope for you to review at your convenience later.  YOU SHOULD EXPECT: Some feelings of bloating in the abdomen. Passage of more gas than usual.  Walking can help get rid of the air that was put into your GI tract during the procedure and reduce the bloating. If you had a lower endoscopy (such as a colonoscopy or flexible sigmoidoscopy) you may notice spotting of blood in your stool or on the toilet paper. If you underwent a bowel prep for your procedure, you may not have a normal bowel movement for a few days.  Please Note:  You might notice some irritation and congestion in your nose or some drainage.  This is from the oxygen used during your procedure.  There is no need for concern and it should clear up in a day or so.  SYMPTOMS TO REPORT IMMEDIATELY:  Following lower endoscopy (colonoscopy or flexible sigmoidoscopy):  Excessive amounts of blood in the stool  Significant tenderness or worsening of abdominal pains  Swelling of the abdomen that is new, acute  Fever of 100F or higher  Following upper endoscopy (EGD)  Vomiting of blood or coffee ground material  New chest pain or pain under the shoulder blades  Painful or persistently difficult swallowing  New shortness of breath  Fever of 100F or higher  Black, tarry-looking stools  Resume previous diet Await pathology results Return to GI clinic in 6-8 weeks with Dr. Suzann  For urgent or emergent issues, a gastroenterologist can be reached at any hour by calling [phone]. Do not  use MyChart messaging for urgent concerns.    DIET:  We do recommend a small meal at first, but then you may proceed to your regular diet.  Drink plenty of fluids but you should avoid alcoholic beverages for 24 hours.  ACTIVITY:  You should plan to take it easy for the rest of today and you should NOT DRIVE or use heavy machinery until tomorrow (because of the sedation medicines used during the test).    FOLLOW UP: Our staff will call the number listed on your records the next business day following your procedure.  We will call around 7:15- 8:00 am to check on you and address any questions or concerns that you may have regarding the information given to you following your procedure. If we do not reach you, we will leave a message.     If any biopsies were taken you will be contacted by phone or by letter within the next 1-3 weeks.  Please call us  at [phone] if you have not heard about the biopsies in 3 weeks.    SIGNATURES/CONFIDENTIALITY: You and/or your care partner have signed paperwork which will be entered into your electronic medical record.  These signatures attest to the fact that that the information above on your After Visit Summary has been reviewed and is understood.  Full responsibility of the confidentiality of this discharge information lies with you and/or your care-partner.

## 2024-03-07 NOTE — Progress Notes (Signed)
1010 HR > 100 with esmolol 25 mg given IV, MD updated, vss

## 2024-03-10 ENCOUNTER — Telehealth: Payer: Self-pay | Admitting: *Deleted

## 2024-03-10 NOTE — Telephone Encounter (Signed)
  Follow up Call-     03/07/2024    9:45 AM  Call back number  Post procedure Call Back phone  # [phone]  Permission to leave phone message Yes     Patient questions:  Do you have a fever, pain , or abdominal swelling? No. Pain Score  0 *  Have you tolerated food without any problems? Yes.    Have you been able to return to your normal activities? Yes.    Do you have any questions about your discharge instructions: Diet   No. Medications  No. Follow up visit  No.  Do you have questions or concerns about your Care? No.  Actions: * If pain score is 4 or above: No action needed, pain <4.

## 2024-03-11 LAB — SURGICAL PATHOLOGY

## 2024-03-17 ENCOUNTER — Ambulatory Visit: Payer: Self-pay | Admitting: Pediatrics

## 2024-03-19 ENCOUNTER — Ambulatory Visit: Admitting: Psychiatry

## 2024-03-19 ENCOUNTER — Encounter: Payer: Self-pay | Admitting: Psychiatry

## 2024-03-19 DIAGNOSIS — F411 Generalized anxiety disorder: Secondary | ICD-10-CM

## 2024-03-19 DIAGNOSIS — F5105 Insomnia due to other mental disorder: Secondary | ICD-10-CM

## 2024-03-19 DIAGNOSIS — F3189 Other bipolar disorder: Secondary | ICD-10-CM | POA: Diagnosis not present

## 2024-03-19 NOTE — Progress Notes (Signed)
 Brittany [name] [id number] 06-05-1956 68 y.o.     Subjective:   Patient ID:  Brittany [name] is a 68 y.o. (DOB 03-07-1956) female.  Chief Complaint:  No chief complaint on file.   HPI [name] presents to the office today for follow-up of bipolar disorder.    seen Oct 25, 2018.  For complaints of cognition Wellbutrin  was increased to 450 mg each morning.  This was felt to be safer than introducing stimulants.  seen October 2020, the following was noted: Called back a couple of weeks later and wanted to reduce the dosage of Wellbutrin  back to 300 mg daily.  It made her feel bad physically on edge.   Isolation is managed..  Mood relatively.  OK.  Has back pain that interferes with normal function.  House is a mess bc can't do much.  Hurt her back in there [employer]. NAC has helped after a couple of months.  Also feels more mellow and nice.  Still problems with concentration.  Asks about meds to help with attention span. Asks about Adderall.  Intermittent problems with forgetfulness and other days she drops the ball. Ok working on self care with walking and watching caffeine and doesn't want to take more meds.  Still living in [place].   Overall mood is pretty good without swings or irritability generally.   No meds were changed.  10/22/2019 appointment, the following is noted: She continues N-acetylcysteine, Wellbutrin  XL 300 mg daily, Equetro  600 mg nightly, lamotrigine  300 mg daily, sertraline  50 mg daily. Had to cancel bc H so sick and died 10-19-2019.  Haven't been alone.   Hasn't decided about where to live.  Will stay where she is for a year.  People are visiting.  [facility] service will be in June.   Peaceful passing.  The family has been supportive.  His kids have been OK.   Sold the hunting cabin to his boys.   She feels good about how she handled things.  Insurance will pay off the bills and has no mortgage.    Occ periods of racing thoughts and periods of irrritability or  periods of isolation.  Overall though doing well.  Overall mood is pretty good without swings or irritability generally.   Patient reports stable mood and denies depressed or irritable moods.  Patient denies any recent difficulty with anxiety.  Patient denies difficulty with sleep initiation or maintenance with melatonin. Denies appetite disturbance.  Patient reports that energy and motivation have been good.  Patient denies any difficulty with concentration.  Patient denies any suicidal ideation.  Protects sleeep 8-4AM.  Melatonin occ.  Not tired unusually.  06/28/2020 appt noted: Had more anxiety and increased Equetro  to 1 twice daily and continued sertraline  50 bc it can make her more hyper.   Very sensitive to alcohol so can't have much of it.   Stopped caffeine unless travels. Anxiety was bad and it is better now. H deceased and problems with his 2 sons but not his daughters. One son with good manners and the other one lacks. Lose phone.   Adjusting pretty well to being a widow.  Can sleep alone and feel OK about it.  More accepting of being alone.  He left her money.  Takes Prevagen and she can tell a difference.   Going to [employer] and will be baptized into the faith. No recent temper problems lately.   Tolerating meds.  Sleep better.  More energy with Prevagen. Still  back pain abu tdcan clean house.    12/30/20 appt noted: Getting along OK.  Having to expel H's son's from her property.  Will is supportive of her position.   Disc friend who is paranoid.  Still living in [place].   Recognizes pt had prior history of paranoia with prior manic ipisodes.  No longer has these sx. No problems with the meds. Patient reports stable mood and denies depressed or irritable moods.  Patient denies any recent difficulty with anxiety.  Patient denies difficulty with sleep initiation or maintenance. Denies appetite disturbance.  Patient reports that energy and motivation have been good.  Patient  denies any difficulty with concentration.  Patient denies any suicidal ideation. Satisfied with meds.  No problems with meds. Plan: no med changes  06/27/2021 appointment with the following noted: Takes all meds at night including Wellbutrin . Dogs wake her.  Don't feel safe without the dogs.  Asks if she can ever stop the meds.  Trying to move back to [place]. Not depressed but prone to anxiety and worry. Knows she should exercise. Plan: Cont meds.  No change indicated Continue Equetro  900 mg HS Continue Wellbutrin  XL 300 mg every morning.  Option weaning. Defer. Continue sertraline  50 mg daily for panic Continue lamotrigine  150 mg 3 daily for depression Currently she is not using propranolol .  04/05/22 appt noted: Off Wellbutrin  but taking other meds noted above.  Stopped bc anxiety. No panic lately. Increased NAC  and Prevagen and caused SE.  Had a fall twice in 8 weeks and was confused. Regular doses were fine and did help. Selling her house.  Haven't decided where yet. Consistent with other meds. Rx meclizine for dizziness. No other problems with meds.   Mood is OK. Plan: Cont meds.  No change indicated Continue Equetro  900 mg HS Off Wellbutrin  Dt panic. Continue sertraline  50 mg daily for panic Continue lamotrigine  150 mg 3 daily for depression Currently she is not using propranolol .  10/18/22 appt noted: BC falling without explanation she weaned off all meds.  Figured out it was lamotrigine .  Hasn't falled since or had muscle jerks.   Was paranoid for ahwile but that resolved.  Had a lot of falls at night.  Mind wasn't clear and speech slurred and was dizzy.   Went off all meds in November and felt elated again.  Wondered if it was mania.  Cleaned house and did tasks.  If feels giddy then doesn't drive.   I think I might be ok on supplements.  %HTP 5 daily, SJW, NAC, vit B12, fish oil, MVI. Manages stress and possible  Socializes with [place] people mostly.  Without meds  has tendency to be irritable and hyperverbal. Remembering things better without meds.  Mind clearer without meds. Couldn't sleep off meds.  Called the [place].  Got Kapusta sleep for 12 days and given hydroxyzine  50 mg HS working pretty well.  Only psych med.  Without hydroxyzine  feels she might have panic. She didn't think to call hear for help. Started talking to sister who takes hydroxyzine . Plan: [place] SE generic esp low cognitive risk Abilify  .  She wants to try low dose 5 mg daily.  12/05/22 appt noted: Took Abilify  for awhile and then started getting clumsy and stopped it. She thinks after 30 years of using med can't seem to take anything.  She doesn't think even low doses of lamotrigine  she could take.  Asks for something that is not for bipolar to tx her off  label. Sleep at most 4 hours usually.  Both initial and terminal ins.  Dogs can interfere.   Not trying to be noncompliant but fearful of meds bc she is alone.   Worries Depakote affects thyroid . Exhausted from not sleeping and stressors from moving.   Thinks the next door neighbor is harrassing her shining lights in window at night.  Sometimes panic and sometimes fear at night.  Has big dog so does not fear being attacked.  Plans security system.  Plan: Option clonazepam  off label sleep or anxiety.   0.5-1.0 mg HS for sleep she agrees Dog good for her mental health.  02/21/23 appt noted: Problems with sciatica and appt pending. Worries about dog.  Wants dog for support animal to be able to take dog into public places except restaurants. No clonazepam  DT hangover and forgetful. Had to remodel kitchen.  So stressful.   Needs to do more and concerned about it.   Sciatica exacerbates anxiety and causes awakening.   Mood stability relatively good otherwise.  Will get down over some things but fighting it.  As long as she can keep functioning she doesn't want new meds. Taking ashwaghanda, 5 HTP, St John's Wort with benefit. Chronic ins  since early childhood.  Use to sleep pretty well with Legrand.  Not used to sleeping alone.   Active at [facility]. Will let people know if I think I'm being taken advantage of.  No psych meds. But is some supplements.   She doesn't want to take other psych meds.   Manages without meds by limiting stress.  If gets in bad mood then isolates.    03/08/23 appt noted: Disc emotional support dog request, Cordelia.  Corgie mix.  Dog is calm.   Has not tried lorazepam  yet.  Has it for emergencies.   When can't sleep plays Psalms quietly.  Helps. No mania.  No sig dep.  Some anxiety at times.   Some anxiety situationally and in her new house.   No psych meds.  Is willing to consider in future.  Says attn is better and perception is better of others and better with money.  She feels she handles others better.  Real sensitive to meds. Chronic conflict between her  and B and sister.  09/18/23 appt noted:  brings service dog Med:  only lorazepam  very rare.   Off sertraline  and AED.   Takes 5HTP, ST Jn wort , valeriarn root to sleep and ashwaganda. Using light box. Helped.   She thinks on psych med she is too nice and gave example of tree guys damaging her power lines.  She thinks on meds she would have paid them and shouldn't have.   She had to pay $2000 to have the line fixed out of her poscket.  Thinks being on med she was paying $800/mo to [facility] but wasn't always like this but became this on meds.   Gall bladder surgery.  Will sit on a chair all day when on psych meds. Was dep when H died but is ok now.   Always passive when I take meds.   Used to have fierce temper but mellowed out.  But some days still mad can be triggered.  I'm gonna be very cautious wiith my temper. Does a lot of CBT to help herself.  And it helps Feels dep but not severe but might be situational DT loss of H.  Restricts herself socially avoiding crowds and heavy traffic.  But walks.  Suspects meds like Zyrtec   Generally med  avoidant.  03/19/24 appt noted:  brings dog for emotional support Med:  only lorazepam  very rare.   Off sertraline  and AED.   Takes 5HTP, ST Jn wort , valeriarn root to sleep and ashwaganda 4000mg  HS, NAC. Using light box. Helped.   Everything that helps anxiety takes away my edge.  I can't be easy with people in [place], [place]. 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   Walks daily with dog.   Trying to get outside bc history seasonal affective disorder.  Winter I s harder to push herself to do things.   Jeh[employer]ah's Witness and active. Awakens 430 am.   Some hangover with Valerian root. Ashwaganda helps anxiety and sleep.  Used regularly. Can drink 1/2 cup coffee in the AM.   Can now get at least 4 hours HS but before could only get 3 hours.   Stress with difficult neighbor.  Comes from [employer] culture.    Past Psychiatric Medication Trials:  naltrexone,  propranolol  hydroxyzine  50 mg HS  wellbutrin  450 SE, sertraline  50,  lamotrigine  300,  lithium ,  Geodon,   Equetro , Abilify  5 complaining of clumsy  Melatonin hangover at 10 mg  Doxepin Clonazepam  0.5 mg hangover  DUI at 27.  B very anxious M and sister also med sensitive.  Review of Systems:  Review of Systems  Constitutional:  Positive for unexpected weight change.  Cardiovascular:  Negative for chest pain and palpitations.  Musculoskeletal:  Positive for arthralgias, back pain, gait problem and neck pain.  Neurological:  Negative for tremors and weakness.  Hematological:  Negative for adenopathy. Does not bruise/bleed easily.  Psychiatric/Behavioral:  Positive for sleep disturbance. Negative for agitation, behavioral problems, confusion, decreased concentration, dysphoric mood, hallucinations, self-injury and suicidal ideas. The patient is nervous/anxious. The patient is not hyperactive.    Still in physical therapy.  Getting nerve block for her back.  Medications: I have reviewed the patient's  current medications.  Current Outpatient Medications  Medication Sig Dispense Refill   Coenzyme Q10 (Q-10 CO-ENZYME PO) Take by mouth.     diclofenac  sodium (VOLTAREN ) 1 % GEL Apply 2 g topically 4 (four) times daily. 100 g 1   FT ASHWAGANDHA EXTRACT PO      lidocaine  (LIDODERM ) 5 % Place 1 patch onto the skin daily.     meloxicam  (MOBIC ) 15 MG tablet Take 1 tablet (15 mg total) by mouth daily. 30 tablet 1   Multiple Vitamin (MULTIVITAMIN WITH MINERALS) TABS Take 1 tablet by mouth daily.     saline (AYR) GEL Place 1 Application into both nostrils every 6 (six) hours as needed. 42.3 g 0   tiZANidine (ZANAFLEX) 2 MG tablet      VALERIAN ROOT PO      No current facility-administered medications for this visit.    Medication Side Effects: None  Allergies:  Allergies  Allergen Reactions   Flexeril  [Cyclobenzaprine ] Other (See Comments)    Unable to leave home when taking as she is incoherent/unsure where she is at times   Nsaids Other (See Comments)    Other Reaction(s): Renal disease   Hydrocodone  Nausea Only    REACTION: GI upset   Motrin  [Ibuprofen ] Nausea Only   Prednisone Other (See Comments)   Robaxin  [Methocarbamol ] Other (See Comments)    Impairs judgement    Past Medical History:  Diagnosis Date   ACNE NEC    ALLERGIC RHINITIS    ALOPECIA    Anal  fissure    Anxiety    Panic Attack.  None in a long time   Arthritis    Asymptomatic microscopic hematuria 12/05/2022   Bowel obstruction (HCC)    Breast mass 12/05/2022   Sep 04, 2008 Entered By: [facility] GULL B Comment: benign on bx   Chronic headaches    migraine- none in years   Constipation    drinks tea with senna   DEPRESSION    Depression    DISORDER, BIPOLAR NEC    Family history of breast cancer    Family history of pancreatic cancer    GERD (gastroesophageal reflux disease)    HEMORRHOIDS-INTERNAL    History of stress test    ETT 10/17: Ex 7'; no chest pain, no ST changes, Duke Treadmill Score 7    HYPERLIPIDEMIA    OBSTRUCTIVE SLEEP APNEA    Seasonal allergies     Family History  Problem Relation Age of Onset   Other Paternal Aunt        x 2, blood clotting disorder   Breast cancer Paternal Aunt 89   Heart disease Sister    Kidney disease Maternal Uncle    Esophageal cancer Paternal Uncle    Pancreatic cancer Paternal Uncle    COPD Father    Breast cancer Mother 84   Breast cancer Maternal Grandmother 45   Stroke Maternal Grandfather    Aneurysm Paternal Grandmother        brain   Hypertension Brother    Breast cancer Maternal Aunt        dx in her 67s   Breast cancer Maternal Aunt        dx in her 53s   Cancer Maternal Uncle        NOS   Cancer Maternal Uncle        tumor on his head   Breast cancer Paternal Aunt        dx in her 61s   Breast cancer Paternal Aunt        dx in her 27s   Lung cancer Paternal Uncle    Breast cancer Cousin 38       paternal first cousin   Colon cancer Maternal Uncle    Colon polyps Maternal Uncle    Irritable bowel syndrome Cousin    Rectal cancer Neg Hx    Stomach cancer Neg Hx     Social History   Socioeconomic History   Marital status: Widowed    Spouse name: Not on file   Number of children: 4   Years of education: Not on file   Highest education level: Not on file  Occupational History   Occupation: disabled    Employer: UNEMPLOYED  Tobacco Use   Smoking status: Former    Current packs/day: 0.00    Average packs/day: 0.1 packs/day for 7.0 years (0.7 ttl pk-yrs)    Types: Cigarettes    Start date: 06/19/1974    Quit date: 06/19/1981    Years since quitting: 42.7   Smokeless tobacco: Never  Vaping Use   Vaping status: Never Used  Substance and Sexual Activity   Alcohol use: No    Comment: rare   Drug use: No   Sexual activity: Not on file  Other Topics Concern   Not on file  Social History Narrative   Lives alone/2025   Social Drivers of Health   Financial Resource Strain: Low Risk  (01/24/2024)   Overall  Financial Resource Strain (CARDIA)  Difficulty of Paying Living Expenses: Not hard at all  Food Insecurity: No Food Insecurity (01/24/2024)   Hunger Vital Sign    Worried About Running Out of Food in the Last Year: Never true    Ran Out of Food in the Last Year: Never true  Transportation Needs: No Transportation Needs (01/24/2024)   PRAPARE - Administrator, Civil Service (Medical): No    Lack of Transportation (Non-Medical): No  Physical Activity: Insufficiently Active (01/24/2024)   Exercise Vital Sign    Days of Exercise per Week: 7 days    Minutes of Exercise per Session: 20 min  Stress: No Stress Concern Present (01/24/2024)   [employer] of Occupational Health - Occupational Stress Questionnaire    Feeling of Stress: Not at all  Social Connections: Moderately Integrated (01/24/2024)   Social Connection and Isolation Panel    Frequency of Communication with Friends and Family: More than three times a week    Frequency of Social Gatherings with Friends and Family: Once a week    Attends Religious Services: More than 4 times per year    Active Member of [employer] or Organizations: Yes    Attends Banker Meetings: Never    Marital Status: Widowed  Intimate Partner Violence: Patient Unable To Answer (01/24/2024)   Humiliation, Afraid, Rape, and Kick questionnaire    Fear of Current or Ex-Partner: Patient unable to answer    Emotionally Abused: Patient unable to answer    Physically Abused: Patient unable to answer    Sexually Abused: Patient unable to answer    Past Medical History, Surgical history, Social history, and Family history were reviewed and updated as appropriate.   Please see review of systems for further details on the patient's review from today.   Objective:   Physical Exam:  There were no vitals taken for this visit.  Physical Exam Constitutional:      General: She is not in acute distress.    Appearance: Normal appearance.   Musculoskeletal:        General: No deformity.  Neurological:     Mental Status: She is alert and oriented to person, place, and time.     Coordination: Coordination normal.     Comments: Using cane  Psychiatric:        Attention and Perception: Attention and perception normal.        Mood and Affect: Mood is anxious. Mood is not depressed. Affect is not labile, angry, tearful or inappropriate.        Speech: Speech normal. Speech is not rapid and pressured.        Behavior: Behavior normal. Behavior is not slowed.        Thought Content: Thought content normal. Thought content is not delusional. Thought content does not include homicidal or suicidal ideation. Thought content does not include suicidal plan.        Cognition and Memory: Cognition normal.        Judgment: Judgment normal.     Comments: Insight intact.  Occ irritable. No auditory or visual hallucinations. No delusions.  Talkative without  pressure.       Lab Review:     Component Value Date/Time   NA 138 01/08/2024 1039   NA 140 06/28/2023 0953   K 3.9 01/08/2024 1039   CL 105 01/08/2024 1039   CO2 26 01/08/2024 1039   GLUCOSE 92 01/08/2024 1039   BUN 9 01/08/2024 1039   BUN 10 06/28/2023 0953  CREATININE 0.66 01/08/2024 1039   CALCIUM  10.4 01/08/2024 1039   CALCIUM  10.4 08/06/2023 0000   PROT 6.9 01/08/2024 1039   ALBUMIN 4.1 01/08/2024 1039   AST 16 01/08/2024 1039   ALT 13 01/08/2024 1039   ALKPHOS 61 01/08/2024 1039   BILITOT 0.6 01/08/2024 1039   GFRNONAA >90 03/07/2014 0749   GFRAA >90 03/07/2014 0749       Component Value Date/Time   WBC 5.5 01/08/2024 1039   RBC 4.45 01/08/2024 1039   HGB 13.4 01/08/2024 1039   HCT 41.0 01/08/2024 1039   PLT 292.0 01/08/2024 1039   MCV 92.1 01/08/2024 1039   MCH 30.7 03/07/2014 0749   MCHC 32.8 01/08/2024 1039   RDW 13.7 01/08/2024 1039   LYMPHSABS 1.9 01/08/2024 1039   MONOABS 0.4 01/08/2024 1039   EOSABS 0.1 01/08/2024 1039   BASOSABS 0.0 01/08/2024  1039    Lithium  Lvl  Date Value Ref Range Status  06/05/2013 0.59 (L) 0.80 - 1.40 mEq/L Final     No results found for: PHENYTOIN, PHENOBARB, VALPROATE, CBMZ   .res Assessment: Plan:    No diagnosis found.  40 min face to face time with patient was spent  Very med sensitive by her report.     Her husband passed April 2021.  She has had a lot of support.  She is handling it well. Jehovah's Witness faith has helped.  Disc bipolar dx and need for meds longterm vs episodic mood disorder given her ability  to get by so far wihtout mood stabilizer.  Hx mood px primarily were anger related.  Disc treatment plan and SE.  She is concerned about SE with meds and doesn't want to take bipolar meds despite risk without them.  There is some concern over mood-driven thought disruption.  Despite being informed about the sig mental health risks of not taking a mood stabilizer with bipolar disorder that is her decsion.  She feels her thought is sharper off meds and handling people better.  She feels she is bipolar based on past but coped with heavy activity and exercise.  Dx bipolar in past by [place].  Also drank like a fish with past hx DuI at 58.  No alcohol now. Disc off label high dose OFA but this is not likely adequate alternative.    Not taking BZ  Disc risk without mood stabilizer. Off for several months without overt mania yet.  Some edginess.   We discussed the short-term risks associated with benzodiazepines including sedation and increased fall risk among others.  Discussed long-term side effect risk including dependence, potential withdrawal symptoms, and the potential eventual dose-related risk of dementia.  But recent studies from 2020 dispute this association between benzodiazepines and dementia risk. Newer studies in 2020 do not support an association with dementia.  She has benfitted from NAC  She is against Depakote bc fear of it affecting liver. Disc category of atypicals for  mood stabilization bc no other options otherwise.  Discussed potential metabolic side effects associated with atypical antipsychotics, as well as potential risk for movement side effects. Advised pt to contact office if movement side effects occur.   Specifically disc TD risk bc has a cousin with it.   Lowest SE generic esp low cognitive risk potential other DA partial agonists but complained of clumsy with Abilify .   If needed can try ultra low dose. Again disc risks without meds.    She doesn't want psych meds at this time but says if  she gets worst she will consider.  Adjusted to living alone.  Consider [facility] testing DT med concerns.  Disc this.   Call if dep or mania gets worse which might occur.  She generally is avoidant of all meds unless essential.   Stopped therapist  finished it.  FU 6 mos bc ok without meds for a long time.    Lorene Macintosh, MD, DFAPA   Please see After Visit Summary for patient specific instructions.  Future Appointments  Date Time Provider Department Center  03/19/2024 10:00 AM Cottle, Lorene KANDICE Raddle., MD CP-CP None  04/02/2024  1:00 PM [facility] 1 [facility] H&V  04/02/2024  2:00 PM Sheree Penne Bruckner, MD [facility] H&V  04/09/2024 10:40 AM Craig Alan SAUNDERS, PA-C [facility]  04/21/2024  1:00 PM Lendia Boby CROME, NP-C [facility]  05/01/2024 11:00 AM [facility]A [facility] [id number]  01/27/2025  2:30 PM [facility]-ANNUAL WELLNESS VISIT [facility] [place]    No orders of the defined types were placed in this encounter.      -------------------------------

## 2024-04-02 ENCOUNTER — Ambulatory Visit (INDEPENDENT_AMBULATORY_CARE_PROVIDER_SITE_OTHER): Admitting: Vascular Surgery

## 2024-04-02 ENCOUNTER — Ambulatory Visit (HOSPITAL_COMMUNITY)
Admission: RE | Admit: 2024-04-02 | Discharge: 2024-04-02 | Disposition: A | Discharge status: Home / Self Care | Source: Ambulatory Visit | Attending: Vascular Surgery | Admitting: Vascular Surgery

## 2024-04-02 ENCOUNTER — Encounter: Payer: Self-pay | Admitting: Vascular Surgery

## 2024-04-02 VITALS — BP 103/72 | HR 53 | Temp 98.0°F | Ht 61.0 in | Wt 157.0 lb

## 2024-04-02 DIAGNOSIS — Z86718 Personal history of other venous thrombosis and embolism: Secondary | ICD-10-CM | POA: Insufficient documentation

## 2024-04-02 DIAGNOSIS — Z01818 Encounter for other preprocedural examination: Secondary | ICD-10-CM | POA: Insufficient documentation

## 2024-04-02 LAB — VAS US ABI WITH/WO TBI
Left ABI: 1.27
Right ABI: 1.26

## 2024-04-02 NOTE — Progress Notes (Signed)
 Patient ID: Brittany [name], female   DOB: 02/23/1956, 68 y.o.   MRN: [id number]  Reason for Consult: New Patient (Initial Visit)   Referred by Tobie Newman LABOR, MD  Subjective:     HPI:  Brittany [name] is a 68 y.o. female we have with history of right knee arthroscopy complicated by DVT approximately 15 years ago.  She states that she was on Coumadin at that time she is now on aspirin  no other blood thinners.  She does have positive family history of DVT in both her mother and father.  She has not had any other clotting issues.  She is now planned for right total knee arthroplasty in [facility] .  She does not have any residual swelling in her lower extremities.  She states that she does not have adequate memory of the past due to previous bipolar medications.  Past Medical History:  Diagnosis Date   ACNE NEC    ALLERGIC RHINITIS    ALOPECIA    Anal fissure    Anxiety    Panic Attack.  None in a long time   Arthritis    Asymptomatic microscopic hematuria 12/05/2022   Bowel obstruction (HCC)    Breast mass 12/05/2022   Sep 04, 2008 Entered By: MEMORY GULL B Comment: benign on bx   Chronic headaches    migraine- none in years   Constipation    drinks tea with senna   DEPRESSION    Depression    DISORDER, BIPOLAR NEC    Family history of breast cancer    Family history of pancreatic cancer    GERD (gastroesophageal reflux disease)    HEMORRHOIDS-INTERNAL    History of stress test    ETT 10/17: Ex 7'; no chest pain, no ST changes, Duke Treadmill Score 7   HYPERLIPIDEMIA    OBSTRUCTIVE SLEEP APNEA    Seasonal allergies    Family History  Problem Relation Age of Onset   Other Paternal Aunt        x 2, blood clotting disorder   Breast cancer Paternal Aunt 28   Heart disease Sister    Kidney disease Maternal Uncle    Esophageal cancer Paternal Uncle    Pancreatic cancer Paternal Uncle    COPD Father    Breast cancer Mother 15   Breast cancer Maternal Grandmother  45   Stroke Maternal Grandfather    Aneurysm Paternal Grandmother        brain   Hypertension Brother    Breast cancer Maternal Aunt        dx in her 53s   Breast cancer Maternal Aunt        dx in her 80s   Cancer Maternal Uncle        NOS   Cancer Maternal Uncle        tumor on his head   Breast cancer Paternal Aunt        dx in her 35s   Breast cancer Paternal Aunt        dx in her 42s   Lung cancer Paternal Uncle    Breast cancer Cousin 25       paternal first cousin   Colon cancer Maternal Uncle    Colon polyps Maternal Uncle    Irritable bowel syndrome Cousin    Rectal cancer Neg Hx    Stomach cancer Neg Hx    Past Surgical History:  Procedure Laterality Date   BREAST LUMPECTOMY Right last done 2005  x 2   CHOLECYSTECTOMY N/A 03/07/2014   Procedure: LAPAROSCOPIC CHOLECYSTECTOMY WITH INTRAOPERATIVE CHOLANGIOGRAM;  Surgeon: Elon Pacini, MD;  Location: [facility] OR;  Service: General;  Laterality: N/A;   EUS N/A 01/27/2014   Procedure: ESOPHAGEAL ENDOSCOPIC ULTRASOUND (EUS) RADIAL;  Surgeon: Elsie Cree, MD;  Location: [facility] ENDOSCOPY;  Service: Endoscopy;  Laterality: N/A;   EYE SURGERY  08/2021   Cataract surgey   HAND SURGERY Bilateral    for arthritis  left 02/02/14- right approx 2014   KNEE SURGERY Bilateral    right x 2 left x 1.  arthroscopy   RADICAL HYSTERECTOMY     thumb and pinkie finger surgery Right 06/19/2012   TONSILLECTOMY      Short Social History:  Social History   Tobacco Use   Smoking status: Former    Current packs/day: 0.00    Average packs/day: 0.1 packs/day for 7.0 years (0.7 ttl pk-yrs)    Types: Cigarettes    Start date: 06/19/1974    Quit date: 06/19/1981    Years since quitting: 42.8   Smokeless tobacco: Never  Substance Use Topics   Alcohol use: No    Comment: rare    Allergies  Allergen Reactions   Flexeril  [Cyclobenzaprine ] Other (See Comments)    Unable to leave home when taking as she is incoherent/unsure where she is at  times   Nsaids Other (See Comments)    Other Reaction(s): Renal disease   Hydrocodone  Nausea Only    REACTION: GI upset   Motrin  [Ibuprofen ] Nausea Only   Prednisone Other (See Comments)   Robaxin  [Methocarbamol ] Other (See Comments)    Impairs judgement    Current Outpatient Medications  Medication Sig Dispense Refill   aspirin  EC 81 MG tablet Take 81 mg by mouth daily. Swallow whole.     Coenzyme Q10 (Q-10 CO-ENZYME PO) Take by mouth.     diclofenac  sodium (VOLTAREN ) 1 % GEL Apply 2 g topically 4 (four) times daily. 100 g 1   FT ASHWAGANDHA EXTRACT PO      lidocaine  (LIDODERM ) 5 % Place 1 patch onto the skin daily.     meloxicam  (MOBIC ) 15 MG tablet Take 1 tablet (15 mg total) by mouth daily. 30 tablet 1   Multiple Vitamin (MULTIVITAMIN WITH MINERALS) TABS Take 1 tablet by mouth daily.     saline (AYR) GEL Place 1 Application into both nostrils every 6 (six) hours as needed. 42.3 g 0   tiZANidine (ZANAFLEX) 2 MG tablet      VALERIAN ROOT PO      No current facility-administered medications for this visit.    Review of Systems  Constitutional:  Constitutional negative. HENT: HENT negative.  Eyes: Eyes negative.  Cardiovascular: Cardiovascular negative.  GI: Gastrointestinal negative.  Musculoskeletal: Positive for leg pain and joint pain.  Neurological: Neurological negative. Psychiatric:       Poor memory       Objective:  Objective   Vitals:   04/02/24 1343  BP: 103/72  Pulse: (!) 53  Temp: 98 F (36.7 C)  SpO2: 98%  Weight: 157 lb (71.2 kg)  Height: 5' 1 (1.549 m)   Body mass index is 29.66 kg/m.  Physical Exam HENT:     Head: Normocephalic.     Nose: Nose normal.     Mouth/Throat:     Mouth: Mucous membranes are moist.  Cardiovascular:     Rate and Rhythm: Normal rate.     Pulses: Normal pulses.  Pulmonary:  Effort: Pulmonary effort is normal.  Musculoskeletal:     Cervical back: Normal range of motion.     Right lower leg: No edema.      Left lower leg: No edema.  Skin:    General: Skin is warm.     Capillary Refill: Capillary refill takes less than 2 seconds.  Neurological:     General: No focal deficit present.     Mental Status: She is alert.     Data: ABI Findings:  +---------+------------------+-----+---------+--------+  Right   Rt Pressure (mmHg)IndexWaveform Comment   +---------+------------------+-----+---------+--------+  Brachial 90                                        +---------+------------------+-----+---------+--------+  PTA     130               1.26 triphasic          +---------+------------------+-----+---------+--------+  DP      124               1.20 triphasic          +---------+------------------+-----+---------+--------+  Great Toe99                0.96                    +---------+------------------+-----+---------+--------+   +---------+------------------+-----+---------+-------+  Left    Lt Pressure (mmHg)IndexWaveform Comment  +---------+------------------+-----+---------+-------+  Brachial 103                                      +---------+------------------+-----+---------+-------+  PTA     119               1.16 triphasic         +---------+------------------+-----+---------+-------+  DP      131               1.27 triphasic         +---------+------------------+-----+---------+-------+  Burnetta Boyers               1.09                   +---------+------------------+-----+---------+-------+   +-------+-----------+-----------+------------+------------+  ABI/TBIToday's ABIToday's TBIPrevious ABIPrevious TBI  +-------+-----------+-----------+------------+------------+  Right 1.26       0.96                                 +-------+-----------+-----------+------------+------------+  Left  1.27       1.09                                 +-------+-----------+-----------+------------+------------+        Summary:  Right: Resting right ankle-brachial index is within normal range. The  right toe-brachial index is normal.    Left: Resting left ankle-brachial index is within normal range. The left  toe-brachial index is normal.         Assessment/Plan:    68 year old female with history of DVT at the time of knee arthroscopy several years ago.  She is now planned for total knee arthroplasty.  Given her family history she could be considered for hematology workup.  As far as perioperative treatment  of DVT risk I would recommend anticoagulation in the perioperative period to prevent DVT given that she had DVT with previous knee arthroscopy.  We got alternatively placed IVC filter and remove after she has recovered however patient is adamantly against filter placement.  As such would recommend perioperative anticoagulation as deemed fit by the orthopedic surgeon and consideration of hematology workup.  She can see me on an as-needed basis.     Penne Lonni Colorado MD [facility]

## 2024-04-08 NOTE — Progress Notes (Unsigned)
 04/09/2024 Brittany [name] [id number] 21-Dec-1955  Referring provider: Lendia Boby CROME, NP-C Primary GI doctor: Dr. Suzann (Dr. Aneita)  ASSESSMENT AND PLAN:  Loose stools have improved some with cutting back on raw veggies/pelvic floor dysfunction MRI AB WO W 03/2022 unremarkable bowel 03/07/2024 colonoscopy poor preparation internal hemorrhoids negative microscopic colitis Went to pelvic floor PT 3 months ago and this helped her urinary symptoms Possible IBS question constipation, pelvic floor, SIBO, ? Too much fiber - continue pelvic floor PT - get KUB to evaluate stool burden -Consider SIBO testing or xifaxin trial pending results - FODMAP diet given  CRC screening, maternal uncle with colon cancer April 2013 colonoscopy small hyperplastic polyps, internal hemorrhoid 03/07/2024 colonoscopy Dr. Suzann fair preparation of colon, hemorrhoids normal mucosa biopsies  recall 5 years due to fair preparation.    Dysphagia slightly improved but still with rare and complains of mucus, some allergies and sinus symptoms, states GERD and nausea have resolved/rare S/p cholecystectomy  2009 EGD gastritis 2015 EUS for elevated LFTs showed gallstones otherwise unremarkable status post cholecystectomy 03/2022 MRI abdomen with and without contrast  follow-up renal cysts showed no hepatic mass no biliary ductal dilation normal pancreas and spleen normal stomach and bowel. 03/07/2024 EGD Dr. Suzann no endoscopic esophageal abnormality status post empiric dilation, gastritis small HH path mild inactive chronic gastritis, negative HP - treat the sinuses, try astelin , follow up with PCP - consider barium swallow if symptoms continue - avoid NSAIDS  Patient Care Team: Lendia Boby CROME, NP-C as PCP - General (Family Medicine) Teresa Tinnie BROCKS, MD (Otolaryngology)  HISTORY OF PRESENT ILLNESS: 68 y.o. female with a past medical history listed below presents for evaluation of diarrhea, vomiting,  discuss colonoscopy.   I last saw the patient 12/19/2023 in the office for diarrhea.   She had negative celiac sed rate CRP, thyroid , CBC and c-Met.  Diatherix negative for infection. 03/07/2024 colonoscopy endoscopy with Dr. Suzann which was unremarkable other than   Discussed the use of AI scribe software for clinical note transcription with the patient, who gave verbal consent to proceed.  History of Present Illness   Brittany [name] is a 68 year old female who presents with gastrointestinal symptoms including loose stools and swallowing difficulties.  She underwent a colonoscopy and endoscopy in September. The colonoscopy preparation was suboptimal, showing scattered stool, fibrous material, and seeds in the cecum, descending, and sigmoid colon, which interfered with visualization. Internal hemorrhoids were noted, and the test was negative for microscopic colitis. She has loose stools, which have improved with dietary changes, specifically reducing vegetable intake.  She experiences swallowing difficulties, describing episodes where a large clump of material comes out of her throat. This sensation is localized to the throat area. She has a history of allergies and sinus issues, which she feels might be related. No nausea or heartburn, but she reports occasional reflux occurring once or twice a month. She uses a nasal spray intermittently for her sinus issues.  Her past medical history includes the removal of her gallbladder, which she feels was beneficial. She follows a diet primarily consisting of fish, [place], and chicken, avoiding beef and pork, and occasionally eats goat meat. She is currently taking several supplements including Saint John's wort, 5-HTP, glucosamine, chondroitin, valerian root, magnesium, and ashwagandha. She uses Mobic  (meloxicam ) as needed for pain, particularly when traveling.  She has a history of pelvic floor dysfunction and underwent physical therapy about three months  ago, which improved her urinary symptoms.  She learned techniques to manage her symptoms, such as breathing exercises and using a stool to elevate her legs during bowel movements.      She  reports that she quit smoking about 42 years ago. Her smoking use included cigarettes. She started smoking about 49 years ago. She has a 0.7 pack-year smoking history. She has never used smokeless tobacco. She reports that she does not drink alcohol and does not use drugs.  RELEVANT GI HISTORY, IMAGING AND LABS: Results   RADIOLOGY MRI of abdomen: Normal  DIAGNOSTIC Colonoscopy: Internal hemorrhoids, negative for microscopic colitis, poor preparation with scattered stool, fibrous material, and seeds in cecum, descending, and sigmoid colon interfering with visualization (02/2024) Endoscopy: Esophageal dilation performed, negative for Helicobacter pylori, negative for celiac disease (02/2024)      CBC    Component Value Date/Time   WBC 5.5 01/08/2024 1039   RBC 4.45 01/08/2024 1039   HGB 13.4 01/08/2024 1039   HCT 41.0 01/08/2024 1039   PLT 292.0 01/08/2024 1039   MCV 92.1 01/08/2024 1039   MCH 30.7 03/07/2014 0749   MCHC 32.8 01/08/2024 1039   RDW 13.7 01/08/2024 1039   LYMPHSABS 1.9 01/08/2024 1039   MONOABS 0.4 01/08/2024 1039   EOSABS 0.1 01/08/2024 1039   BASOSABS 0.0 01/08/2024 1039   Recent Labs    01/08/24 1039  HGB 13.4    CMP     Component Value Date/Time   NA 138 01/08/2024 1039   NA 140 06/28/2023 0953   K 3.9 01/08/2024 1039   CL 105 01/08/2024 1039   CO2 26 01/08/2024 1039   GLUCOSE 92 01/08/2024 1039   BUN 9 01/08/2024 1039   BUN 10 06/28/2023 0953   CREATININE 0.66 01/08/2024 1039   CALCIUM  10.4 01/08/2024 1039   CALCIUM  10.4 08/06/2023 0000   PROT 6.9 01/08/2024 1039   ALBUMIN 4.1 01/08/2024 1039   AST 16 01/08/2024 1039   ALT 13 01/08/2024 1039   ALKPHOS 61 01/08/2024 1039   BILITOT 0.6 01/08/2024 1039   GFRNONAA >90 03/07/2014 0749   GFRAA >90 03/07/2014  0749      Latest Ref Rng & Units 01/08/2024   10:39 AM 12/05/2022    9:25 AM 01/26/2017   10:30 AM  Hepatic Function  Total Protein 6.0 - 8.3 g/dL 6.9  7.0  7.3   Albumin 3.5 - 5.2 g/dL 4.1  3.9  4.4   AST 0 - 37 U/L 16  21  26    ALT 0 - 35 U/L 13  19  52   Alk Phosphatase 39 - 117 U/L 61  102  117   Total Bilirubin 0.2 - 1.2 mg/dL 0.6  0.6  0.3       Current Medications:      Current Outpatient Medications (Analgesics):    aspirin  EC 81 MG tablet, Take 81 mg by mouth daily. Swallow whole.   meloxicam  (MOBIC ) 15 MG tablet, Take 1 tablet (15 mg total) by mouth daily. (Patient taking differently: Take 15 mg by mouth daily as needed.)   Current Outpatient Medications (Other):    Coenzyme Q10 (Q-10 CO-ENZYME PO), Take by mouth.   diclofenac  sodium (VOLTAREN ) 1 % GEL, Apply 2 g topically 4 (four) times daily.   estradiol (ESTRACE) 0.1 MG/GM vaginal cream, Place 1 Applicatorful vaginally. Hasn't started yet   FT ASHWAGANDHA EXTRACT PO,    glucosamine-chondroitin 500-400 MG tablet, Take by mouth. daily   lidocaine  (LIDODERM ) 5 %, Place 1 patch onto the skin  daily.   Multiple Vitamin (MULTIVITAMIN WITH MINERALS) TABS, Take 1 tablet by mouth daily.   OVER THE COUNTER MEDICATION, 5 HTP- 2 daily   OVER THE COUNTER MEDICATION, Magnesium 500mg  : 2 daily at bedtime   [facility] Wort 150 MG CAPS, Take by mouth. 2 daily   tiZANidine (ZANAFLEX) 2 MG tablet,    VALERIAN ROOT PO,   Medical History:  Past Medical History:  Diagnosis Date   ACNE NEC    ALLERGIC RHINITIS    ALOPECIA    Anal fissure    Anxiety    Panic Attack.  None in a long time   Arthritis    Asymptomatic microscopic hematuria 12/05/2022   Bowel obstruction (HCC)    Breast mass 12/05/2022   Sep 04, 2008 Entered By: MEMORY GULL B Comment: benign on bx   Chronic headaches    migraine- none in years   Constipation    drinks tea with senna   DEPRESSION    Depression    DISORDER, BIPOLAR NEC    Family history of  breast cancer    Family history of pancreatic cancer    GERD (gastroesophageal reflux disease)    HEMORRHOIDS-INTERNAL    History of stress test    ETT 10/17: Ex 7'; no chest pain, no ST changes, Duke Treadmill Score 7   HYPERLIPIDEMIA    OBSTRUCTIVE SLEEP APNEA    Seasonal allergies    Allergies:  Allergies  Allergen Reactions   Flexeril  [Cyclobenzaprine ] Other (See Comments)    Unable to leave home when taking as she is incoherent/unsure where she is at times   Nsaids Other (See Comments)    Other Reaction(s): Renal disease   Hydrocodone  Nausea Only    REACTION: GI upset   Motrin  [Ibuprofen ] Nausea Only   Prednisone Other (See Comments)   Robaxin  [Methocarbamol ] Other (See Comments)    Impairs judgement     Surgical History:  She  has a past surgical history that includes Knee surgery (Bilateral); Radical hysterectomy; Tonsillectomy; Breast lumpectomy (Right, last done 2005); thumb and pinkie finger surgery (Right, 06/19/2012); EUS (N/A, 01/27/2014); Hand surgery (Bilateral); Cholecystectomy (N/A, 03/07/2014); and Eye surgery (08/2021). Family History:  Her family history includes Aneurysm in her paternal grandmother; Breast cancer in her maternal aunt, maternal aunt, paternal aunt, and paternal aunt; Breast cancer (age of onset: 79) in her cousin; Breast cancer (age of onset: 61) in her maternal grandmother; Breast cancer (age of onset: 42) in her mother; Breast cancer (age of onset: 18) in her paternal aunt; COPD in her father; Cancer in her maternal uncle and maternal uncle; Colon cancer in her maternal uncle; Colon polyps in her maternal uncle; Esophageal cancer in her paternal uncle; Heart disease in her sister; Hypertension in her brother; Irritable bowel syndrome in her cousin; Kidney disease in her maternal uncle; Lung cancer in her paternal uncle; Other in her paternal aunt; Pancreatic cancer in her paternal uncle; Stroke in her maternal grandfather.  REVIEW OF SYSTEMS  : All  other systems reviewed and negative except where noted in the History of Present Illness.  PHYSICAL EXAM: BP 100/70   Pulse 61   Ht 5' 0.25 (1.53 m)   Wt 158 lb (71.7 kg)   BMI 30.60 kg/m  Physical Exam   GENERAL APPEARANCE: Well nourished, in no apparent distress. HEENT: No cervical lymphadenopathy, unremarkable thyroid , sclerae anicteric, conjunctiva pink. RESPIRATORY: Respiratory effort normal, breath sounds equal bilaterally without rales, rhonchi, or wheezing. CARDIO: Regular rate and rhythm with  no murmurs, rubs, or gallops, peripheral pulses intact. ABDOMEN: Soft, non-distended, active bowel sounds in all four quadrants, non-tender to palpation, no rebound tenderness, no masses appreciated. RECTAL: Declines. MUSCULOSKELETAL: Full range of motion, normal gait, without edema. SKIN: Dry, intact without rashes or lesions. No jaundice. NEURO: Alert, oriented, no focal deficits. PSYCH: Cooperative, normal mood and affect.      Alan JONELLE Coombs, PA-C 11:31 AM

## 2024-04-09 ENCOUNTER — Encounter: Payer: Self-pay | Admitting: Physician Assistant

## 2024-04-09 ENCOUNTER — Ambulatory Visit: Admitting: Physician Assistant

## 2024-04-09 ENCOUNTER — Ambulatory Visit (INDEPENDENT_AMBULATORY_CARE_PROVIDER_SITE_OTHER)
Admission: RE | Admit: 2024-04-09 | Discharge: 2024-04-09 | Disposition: A | Discharge status: Home / Self Care | Source: Ambulatory Visit | Attending: Physician Assistant | Admitting: Physician Assistant

## 2024-04-09 VITALS — BP 100/70 | HR 61 | Ht 60.25 in | Wt 158.0 lb

## 2024-04-09 DIAGNOSIS — R110 Nausea: Secondary | ICD-10-CM

## 2024-04-09 DIAGNOSIS — R1310 Dysphagia, unspecified: Secondary | ICD-10-CM

## 2024-04-09 DIAGNOSIS — K580 Irritable bowel syndrome with diarrhea: Secondary | ICD-10-CM

## 2024-04-09 DIAGNOSIS — K219 Gastro-esophageal reflux disease without esophagitis: Secondary | ICD-10-CM | POA: Diagnosis not present

## 2024-04-09 NOTE — Patient Instructions (Addendum)
 Your provider has requested that you have an abdominal x ray before leaving today. Please go to the basement floor to our Radiology department for the test.  First do a trial off milk/lactose products if you use them.  Can do trial of IBGard which is over the counter for AB pain- Take 1-2 capsules once a day for maintence or twice a day during a flare  Toileting tips to help with your constipation - Drink at least 64-80 ounces of water/liquid per day. - Establish a time to try to move your bowels every day.  For many people, this is after a cup of coffee or after a meal such as breakfast. - Sit all of the way back on the toilet keeping your back fairly straight and while sitting up, try to rest the tops of your forearms on your upper thighs.   - Raising your feet with a step stool/squatty potty can be helpful to improve the angle that allows your stool to pass through the rectum. - Relax the rectum feeling it bulge toward the toilet water.  If you feel your rectum raising toward your body, you are contracting rather than relaxing. - Breathe in and slowly exhale. Belly breath by expanding your belly towards your belly button. Keep belly expanded as you gently direct pressure down and back to the anus.  A low pitched GRRR sound can assist with increasing intra-abdominal pressure.  (Can also trying to blow on a pinwheel and make it move, this helps with the same belly breathing) - Repeat 3-4 times. If unsuccessful, contract the pelvic floor to restore normal tone and get off the toilet.  Avoid excessive straining. - To reduce excessive wiping by teaching your anus to normally contract, place hands on outer aspect of knees and resist knee movement outward.  Hold 5-10 second then place hands just inside of knees and resist inward movement of knees.  Hold 5 seconds.  Repeat a few times each way.  Go to the ER if unable to pass gas, severe AB pain, unable to hold down food, any shortness of breath of  chest pain.     FODMAP stands for fermentable oligo-, di-, mono-saccharides and polyols (1). These are the scientific terms used to classify groups of carbs that are difficult for our body to digest and that are notorious for triggering digestive symptoms like bloating, gas, loose stools and stomach pain.   You can try low FODMAP diet  - start with eliminating just one column at a time that you feel may be a trigger for you. - the table at the very bottom contains foods that are low in FODMAPs   Sometimes trying to eliminate the FODMAP's from your diet is difficult or tricky, if you are stuggling with trying to do the elimination diet you can try an enzyme.  There is a food enzymes that you sprinkle in or on your food that helps break down the FODMAP. You can read more about the enzyme by going to this site: https://fodzyme.com/   Small intestinal bacterial overgrowth (SIBO) occurs when there is an abnormal increase in the overall bacterial population in the small intestine -- particularly types of bacteria not commonly found in that part of the digestive tract. Small intestinal bacterial overgrowth (SIBO) commonly results when a circumstance -- such as surgery or disease -- slows the passage of food and waste products in the digestive tract, creating a breeding ground for bacteria.  Signs and symptoms of SIBO often include:  Loss of appetite Abdominal pain Nausea Bloating An uncomfortable feeling of fullness after eating Diarrhea or constipation, depending on the type of gas produced  What foods trigger SIBO? While foods aren't the original cause of SIBO, certain foods do encourage the overgrowth of the wrong bacteria in your small intestine. If you're feeding them their favorite foods, they're going to grow more, and that will trigger more of your SIBO symptoms. By the same token, you can help reduce the overgrowth by starving the problematic bacteria of their favorite foods. This  strategy has led to a number of proposed SIBO eating plans. The plans vary, and so do individual results. But in general, they tend to recommend limiting carbohydrates.  These include: Sugars and sweeteners. Fruits and starchy vegetables. Dairy products. Grains.  There is a test for this we can do called a breath test, if you are positive we will treat you with an antibiotic to see if it helps.  Your symptoms are very suspicious for this condition, as discussed, we will start you on an antibiotic to see if this helps.   Due to recent changes in healthcare laws, you may see the results of your imaging and laboratory studies on MyChart before your provider has had a chance to review them.  We understand that in some cases there may be results that are confusing or concerning to you. Not all laboratory results come back in the same time frame and the provider may be waiting for multiple results in order to interpret others.  Please give us  48 hours in order for your provider to thoroughly review all the results before contacting the office for clarification of your results.    I appreciate the  opportunity to care for you  Thank You   [facility]

## 2024-04-09 NOTE — Addendum Note (Signed)
 Addended by: Macyn Remmert N on: 04/09/2024 11:49 AM   Modules accepted: Orders

## 2024-04-11 NOTE — Telephone Encounter (Signed)
 Patient came in today at 12:37, states she forgot to mention in her visit on 10/22, that she's been having black since September after her procedure. She states she would like a nurse to call and advise on what to do. Please to advise. Thank you

## 2024-04-11 NOTE — Telephone Encounter (Signed)
 Left message for patient to call back

## 2024-04-14 ENCOUNTER — Ambulatory Visit: Payer: Self-pay | Admitting: Physician Assistant

## 2024-04-14 ENCOUNTER — Other Ambulatory Visit: Payer: Self-pay

## 2024-04-14 ENCOUNTER — Other Ambulatory Visit (HOSPITAL_COMMUNITY)
Admission: RE | Admit: 2024-04-14 | Discharge: 2024-04-14 | Disposition: A | Discharge status: Home / Self Care | Source: Ambulatory Visit

## 2024-04-14 ENCOUNTER — Ambulatory Visit (HOSPITAL_COMMUNITY)
Admission: RE | Admit: 2024-04-14 | Discharge: 2024-04-14 | Disposition: A | Discharge status: Home / Self Care | Source: Ambulatory Visit

## 2024-04-14 ENCOUNTER — Encounter (HOSPITAL_COMMUNITY)
Admission: RE | Admit: 2024-04-14 | Discharge: 2024-04-14 | Disposition: A | Discharge status: Home / Self Care | Source: Ambulatory Visit

## 2024-04-14 ENCOUNTER — Other Ambulatory Visit (HOSPITAL_COMMUNITY): Payer: Self-pay

## 2024-04-14 DIAGNOSIS — Z86718 Personal history of other venous thrombosis and embolism: Secondary | ICD-10-CM | POA: Diagnosis present

## 2024-04-14 DIAGNOSIS — R001 Bradycardia, unspecified: Secondary | ICD-10-CM | POA: Diagnosis not present

## 2024-04-14 DIAGNOSIS — Z01818 Encounter for other preprocedural examination: Secondary | ICD-10-CM | POA: Diagnosis present

## 2024-04-14 LAB — CBC
HCT: 41.5 % (ref 36.0–46.0)
Hemoglobin: 13.2 g/dL (ref 12.0–15.0)
MCH: 30.3 pg (ref 26.0–34.0)
MCHC: 31.8 g/dL (ref 30.0–36.0)
MCV: 95.2 fL (ref 80.0–100.0)
Platelets: 304 K/uL (ref 150–400)
RBC: 4.36 MIL/uL (ref 3.87–5.11)
RDW: 13.3 % (ref 11.5–15.5)
WBC: 6.2 K/uL (ref 4.0–10.5)
nRBC: 0 % (ref 0.0–0.2)

## 2024-04-14 LAB — BASIC METABOLIC PANEL WITH GFR
Anion gap: 7 (ref 5–15)
BUN: 12 mg/dL (ref 8–23)
CO2: 25 mmol/L (ref 22–32)
Calcium: 10.4 mg/dL — ABNORMAL HIGH (ref 8.9–10.3)
Chloride: 109 mmol/L (ref 98–111)
Creatinine, Ser: 0.56 mg/dL (ref 0.44–1.00)
GFR, Estimated: >60 mL/min (ref >60)
Glucose, Bld: 100 mg/dL — ABNORMAL HIGH (ref 70–99)
Potassium: 4.9 mmol/L (ref 3.5–5.1)
Sodium: 141 mmol/L (ref 135–145)

## 2024-04-14 NOTE — Telephone Encounter (Signed)
 Pt stated that she had an EGD/Colon on 03/07/2024.  Pt stated that several days afterwards she started taking her 81 mg Asprin that she typically takes for back pain. Pt stated that shortly after taking the Asprin she noted that her stools were dark. Pt stated that the dark stools  continued until several days ago when she  stopped taking the Asprin and noted that her stools returned to normal color.  Pt stated that she is not taking any iron or pepto.  No other GI symptoms.  Please review and advise

## 2024-04-15 NOTE — Telephone Encounter (Signed)
 Left message for pt to call back

## 2024-04-16 ENCOUNTER — Other Ambulatory Visit: Payer: Self-pay

## 2024-04-16 DIAGNOSIS — K2970 Gastritis, unspecified, without bleeding: Secondary | ICD-10-CM

## 2024-04-16 DIAGNOSIS — K219 Gastro-esophageal reflux disease without esophagitis: Secondary | ICD-10-CM

## 2024-04-16 DIAGNOSIS — R110 Nausea: Secondary | ICD-10-CM

## 2024-04-16 MED ORDER — PANTOPRAZOLE SODIUM 40 MG PO TBEC: 40.0000 mg | DELAYED_RELEASE_TABLET | Freq: Every day | ORAL | 0 refills | Status: AC

## 2024-04-16 NOTE — Telephone Encounter (Signed)
 Pt made aware of Dr. Suzann recommendations. Prescription sent to pharmacy.  Pt made aware.  Pt verbalized understanding with all questions answered.

## 2024-04-17 ENCOUNTER — Other Ambulatory Visit

## 2024-04-21 ENCOUNTER — Ambulatory Visit: Admitting: Family Medicine

## 2024-04-24 ENCOUNTER — Ambulatory Visit (INDEPENDENT_AMBULATORY_CARE_PROVIDER_SITE_OTHER): Admitting: Podiatry

## 2024-04-24 ENCOUNTER — Encounter: Payer: Self-pay | Admitting: Podiatry

## 2024-04-24 DIAGNOSIS — L600 Ingrowing nail: Secondary | ICD-10-CM

## 2024-04-24 NOTE — Progress Notes (Signed)
 Subjective:   Patient ID: Brittany [name], female   DOB: 68 y.o.   MRN: [id number]   HPI Patient presents with chronic ingrown toenail right big toe lateral border painful when pressed   ROS      Objective:  Physical Exam  Neurovascular status intact incurvated lateral border right big toe painful when pressed with no redness or drainage noted     Assessment:  Chronic ingrown toenail deformity right hallux lateral border with pain     Plan:  H&P reviewed discussed condition recommended correction of deformity explained procedure risk and patient wants surgery.  Today I infiltrated the right big toe 60 mg Xylocaine  Marcaine  mixture sterile prep done using sterile instrumentation removed the lateral border exposed matrix applied phenol 3 applications 30 seconds followed by alcohol lavage sterile dressing gave instructions on soaks wear dressing 24 hours take it off earlier if throbbing were to occur and encouraged to call with questions

## 2024-04-24 NOTE — Patient Instructions (Signed)

## 2024-04-28 ENCOUNTER — Telehealth: Payer: Self-pay | Admitting: Cardiology

## 2024-04-28 NOTE — Telephone Encounter (Signed)
 Pt called in stating [facility] told her she needs to take a calcium  supplement due to her having a scan and something being wrong with her bones. She states she thought Dr. Jordan told her to stay away from calcium . Please advise.

## 2024-04-28 NOTE — Telephone Encounter (Signed)
 Spoke to patient she stated she had a bone scan done and GYN told her she needed to take calcium  supplement.Stated when she had last blood work her calcium  level was elevated and Dr.Jordan told her do not take any calcium  supplements and no cheese.Stated she wanted Dr.Jordan's advice.Message sent to Dr.Jordan.

## 2024-04-29 ENCOUNTER — Encounter: Payer: Self-pay | Admitting: Family Medicine

## 2024-04-29 ENCOUNTER — Ambulatory Visit: Admitting: Family Medicine

## 2024-04-29 VITALS — BP 122/68 | HR 67 | Temp 97.8°F | Ht 60.25 in | Wt 156.0 lb

## 2024-04-29 DIAGNOSIS — E785 Hyperlipidemia, unspecified: Secondary | ICD-10-CM | POA: Diagnosis not present

## 2024-04-29 DIAGNOSIS — E213 Hyperparathyroidism, unspecified: Secondary | ICD-10-CM

## 2024-04-29 DIAGNOSIS — Z832 Family history of diseases of the blood and blood-forming organs and certain disorders involving the immune mechanism: Secondary | ICD-10-CM

## 2024-04-29 DIAGNOSIS — Z86718 Personal history of other venous thrombosis and embolism: Secondary | ICD-10-CM

## 2024-04-29 DIAGNOSIS — Z01818 Encounter for other preprocedural examination: Secondary | ICD-10-CM | POA: Diagnosis not present

## 2024-04-29 DIAGNOSIS — G4733 Obstructive sleep apnea (adult) (pediatric): Secondary | ICD-10-CM

## 2024-04-29 DIAGNOSIS — E559 Vitamin D deficiency, unspecified: Secondary | ICD-10-CM | POA: Diagnosis not present

## 2024-04-29 LAB — LIPID PANEL
Cholesterol: 234 mg/dL — ABNORMAL HIGH (ref 0–200)
HDL: 83.8 mg/dL (ref >39.00)
LDL Cholesterol: 133 mg/dL — ABNORMAL HIGH (ref 0–99)
NonHDL: 149.71
Total CHOL/HDL Ratio: 3
Triglycerides: 82 mg/dL (ref 0.0–149.0)
VLDL: 16.4 mg/dL (ref 0.0–40.0)

## 2024-04-29 LAB — VITAMIN D 25 HYDROXY (VIT D DEFICIENCY, FRACTURES): VITD: 78.05 ng/mL (ref 30.00–100.00)

## 2024-04-29 LAB — TSH: TSH: 0.76 u[IU]/mL (ref 0.35–5.50)

## 2024-04-29 NOTE — Patient Instructions (Signed)
 Please go downstairs for labs before you leave.  I will  I am referring you to hematology for evaluation regarding history of blood clots along with family history of blood clots and you should see them before you have your upcoming knee replacement.  I am checking your calcium  and parathyroid hormone levels.  If these are elevated, you may need to see endocrinology prior to your surgery.  I will keep you informed.

## 2024-04-29 NOTE — Telephone Encounter (Signed)
 Spoke to patient Dr.Jordan's advice given.

## 2024-04-29 NOTE — Progress Notes (Signed)
 Subjective:     Patient ID: Bunny KANDICE Ly, female    DOB: 1955/12/05, 68 y.o.   MRN: [id number]  Chief Complaint  Patient presents with   surgical clearance    Needs surgical clearance forms for right knee replacement  Also wants orders placed for home health to take care of her for the weeks after surgery     HPI  Discussed the use of AI scribe software for clinical note transcription with the patient, who gave verbal consent to proceed.  History of Present Illness Dabney G Scribner is a 68 year old female who presents for preoperative surgical clearance for a right knee replacement.  Preoperative evaluation for right knee arthroplasty - Scheduled for right knee replacement on December 2nd in [place], [place]  with Dr. Newman Blanch at [facility]. - Completed preoperative labs, bone scan, and other assessments. - cleared by cardiologist but advised to follow up with PCP for hypercalcemia   Parathyroid and calcium  metabolism abnormalities - History of hyperparathyroidism with elevated calcium  levels. - Mild osteoporosis present. - Parathyroid hormone level was normal seven years ago. - No history of nephrolithiasis.   Thromboembolic disease - History of deep venous thrombosis related to previous surgery. - Family history of deep venous thrombosis. - Multiple prior surgeries, including full hysterectomy and knee surgeries. - One prior knee surgery resulted in a blood clot.  Thyroid  dysfunction - History of hypothyroidism, which normalized after discontinuation of bipolar disorder medication.  Sleep-disordered breathing - Sleep apnea managed with CPAP therapy.  Vitamin d  supplementation - Currently taking vitamin D3 supplements.    Health Maintenance Due  Topic Date Due   Pneumococcal Vaccine: 50+ Years (1 of 1 - PCV) 05/03/2007    Past Medical History:  Diagnosis Date   ACNE NEC    ALLERGIC RHINITIS    ALOPECIA    Anal  fissure    Anxiety    Panic Attack.  None in a long time   Arthritis    Asymptomatic microscopic hematuria 12/05/2022   Bowel obstruction (HCC)    Breast mass 12/05/2022   Sep 04, 2008 Entered By: MEMORY GULL B Comment: benign on bx   Chronic headaches    migraine- none in years   Constipation    drinks tea with senna   DEPRESSION    Depression    DISORDER, BIPOLAR NEC    Family history of breast cancer    Family history of pancreatic cancer    GERD (gastroesophageal reflux disease)    HEMORRHOIDS-INTERNAL    History of stress test    ETT 10/17: Ex 7'; no chest pain, no ST changes, [facility] Treadmill Score 7   HYPERLIPIDEMIA    OBSTRUCTIVE SLEEP APNEA    Seasonal allergies     Past Surgical History:  Procedure Laterality Date   BREAST LUMPECTOMY Right last done 2005   x 2   CHOLECYSTECTOMY N/A 03/07/2014   Procedure: LAPAROSCOPIC CHOLECYSTECTOMY WITH INTRAOPERATIVE CHOLANGIOGRAM;  Surgeon: Elon Pacini, MD;  Location: [facility] OR;  Service: General;  Laterality: N/A;   EUS N/A 01/27/2014   Procedure: ESOPHAGEAL ENDOSCOPIC ULTRASOUND (EUS) RADIAL;  Surgeon: Elsie Cree, MD;  Location: [facility] END[facility]COPY;  Service: Endoscopy;  Laterality: N/A;   EYE SURGERY  08/2021   Cataract surgey   HAND SURGERY Bilateral    for arthritis  left 02/02/14- right approx 2014   KNEE SURGERY Bilateral    right x 2 left x 1.  arthroscopy   RADICAL HYSTERECTOMY  thumb and pinkie finger surgery Right 06/19/2012   TONSILLECTOMY      Family History  Problem Relation Age of Onset   Other Paternal Aunt        x 2, blood clotting disorder   Breast cancer Paternal Aunt 65   Heart disease Sister    Kidney disease Maternal Uncle    Esophageal cancer Paternal Uncle    Pancreatic cancer Paternal Uncle    COPD Father    Breast cancer Mother 38   Breast cancer Maternal Grandmother 45   Stroke Maternal Grandfather    Aneurysm Paternal Grandmother        brain   Hypertension Brother    Breast cancer  Maternal Aunt        dx in her 86s   Breast cancer Maternal Aunt        dx in her 53s   Cancer Maternal Uncle        NOS   Cancer Maternal Uncle        tumor on his head   Breast cancer Paternal Aunt        dx in her 71s   Breast cancer Paternal Aunt        dx in her 45s   Lung cancer Paternal Uncle    Breast cancer Cousin 3       paternal first cousin   Colon cancer Maternal Uncle    Colon polyps Maternal Uncle    Irritable bowel syndrome Cousin    Rectal cancer Neg Hx    Stomach cancer Neg Hx     Social History   Socioeconomic History   Marital status: Widowed    Spouse name: Not on file   Number of children: 4   Years of education: Not on file   Highest education level: Not on file  Occupational History   Occupation: disabled    Employer: UNEMPLOYED  Tobacco Use   Smoking status: Former    Current packs/day: 0.00    Average packs/day: 0.1 packs/day for 7.0 years (0.7 ttl pk-yrs)    Types: Cigarettes    Start date: 06/19/1974    Quit date: 06/19/1981    Years since quitting: 42.8   Smokeless tobacco: Never  Vaping Use   Vaping status: Never Used  Substance and Sexual Activity   Alcohol use: Yes    Comment: rare   Drug use: No   Sexual activity: Not on file  Other Topics Concern   Not on file  Social History Narrative   Lives alone/2025   Social Drivers of Health   Financial Resource Strain: Low Risk  (01/24/2024)   Overall Financial Resource Strain (CARDIA)    Difficulty of Paying Living Expenses: Not hard at all  Food Insecurity: No Food Insecurity (01/24/2024)   Hunger Vital Sign    Worried About Running Out of Food in the Last Year: Never true    Ran Out of Food in the Last Year: Never true  Transportation Needs: No Transportation Needs (01/24/2024)   PRAPARE - Administrator, Civil Service (Medical): No    Lack of Transportation (Non-Medical): No  Physical Activity: Insufficiently Active (01/24/2024)   Exercise Vital Sign    Days of Exercise  per Week: 7 days    Minutes of Exercise per Session: 20 min  Stress: No Stress Concern Present (01/24/2024)   [employer] of Occupational Health - Occupational Stress Questionnaire    Feeling of Stress: Not at all  Social Connections:  Moderately Integrated (01/24/2024)   Social Connection and Isolation Panel    Frequency of Communication with Friends and Family: More than three times a week    Frequency of Social Gatherings with Friends and Family: Once a week    Attends Religious Services: More than 4 times per year    Active Member of [employer] or Organizations: Yes    Attends Banker Meetings: Never    Marital Status: Widowed  Intimate Partner Violence: Patient Unable To Answer (01/24/2024)   Humiliation, Afraid, Rape, and Kick questionnaire    Fear of Current or Ex-Partner: Patient unable to answer    Emotionally Abused: Patient unable to answer    Physically Abused: Patient unable to answer    Sexually Abused: Patient unable to answer    Outpatient Medications Prior to Visit  Medication Sig Dispense Refill   Coenzyme Q10 (Q-10 CO-ENZYME PO) Take by mouth.     diclofenac  sodium (VOLTAREN ) 1 % GEL Apply 2 g topically 4 (four) times daily. 100 g 1   FT ASHWAGANDHA EXTRACT PO      glucosamine-chondroitin 500-400 MG tablet Take by mouth. daily     lidocaine  (LIDODERM ) 5 % Place 1 patch onto the skin daily.     meloxicam  (MOBIC ) 15 MG tablet Take 1 tablet (15 mg total) by mouth daily. (Patient taking differently: Take 15 mg by mouth daily as needed.) 30 tablet 1   Multiple Vitamin (MULTIVITAMIN WITH MINERALS) TABS Take 1 tablet by mouth daily.     OVER THE COUNTER MEDICATION 5 HTP- 2 daily     OVER THE COUNTER MEDICATION Magnesium 500mg  : 2 daily at bedtime     pantoprazole (PROTONIX) 40 MG tablet Take 1 tablet (40 mg total) by mouth daily. 90 tablet 0   St Johns Wort 150 MG CAPS Take by mouth. 2 daily     tiZANidine (ZANAFLEX) 2 MG tablet  (Patient taking differently: As  needed)     VALERIAN ROOT PO      aspirin  EC 81 MG tablet Take 81 mg by mouth daily. Swallow whole.     estradiol (ESTRACE) 0.1 MG/GM vaginal cream Place 1 Applicatorful vaginally. Hasn't started yet     No facility-administered medications prior to visit.    Allergies  Allergen Reactions   Flexeril  [Cyclobenzaprine ] Other (See Comments)    Unable to leave home when taking as she is incoherent/unsure where she is at times   Nsaids Other (See Comments)    Other Reaction(s): Renal disease   Hydrocodone  Nausea Only    REACTION: GI upset   Motrin  [Ibuprofen ] Nausea Only   Prednisone Other (See Comments)   Robaxin  [Methocarbamol ] Other (See Comments)    Impairs judgement    Review of Systems  Constitutional:  Negative for chills and fever.  Respiratory:  Negative for shortness of breath.   Cardiovascular:  Negative for chest pain, palpitations and leg swelling.  Gastrointestinal:  Negative for abdominal pain, constipation, diarrhea, nausea and vomiting.  Genitourinary:  Negative for dysuria, frequency and urgency.  Musculoskeletal:  Positive for joint pain.  Neurological:  Negative for dizziness, focal weakness and headaches.       Objective:    Physical Exam Constitutional:      General: She is not in acute distress.    Appearance: She is not ill-appearing.  HENT:     Mouth/Throat:     Mouth: Mucous membranes are moist.     Pharynx: Oropharynx is clear.  Eyes:  Extraocular Movements: Extraocular movements intact.     Conjunctiva/sclera: Conjunctivae normal.     Pupils: Pupils are equal, round, and reactive to light.  Cardiovascular:     Rate and Rhythm: Normal rate and regular rhythm.  Pulmonary:     Effort: Pulmonary effort is normal.     Breath sounds: Normal breath sounds.  Musculoskeletal:     Cervical back: Normal range of motion and neck supple. No tenderness.     Right lower leg: No edema.     Left lower leg: No edema.  Lymphadenopathy:     Cervical: No  cervical adenopathy.  Skin:    General: Skin is warm and dry.  Neurological:     General: No focal deficit present.     Mental Status: She is alert and oriented to person, place, and time.  Psychiatric:        Mood and Affect: Mood normal.        Behavior: Behavior normal.        Thought Content: Thought content normal.      BP 122/68   Pulse 67   Temp 97.8 F (36.6 C) (Temporal)   Ht 5' 0.25 (1.53 m)   Wt 156 lb (70.8 kg)   SpO2 100%   BMI 30.21 kg/m  Wt Readings from Last 3 Encounters:  05/01/24 157 lb (71.2 kg)  04/29/24 156 lb (70.8 kg)  04/09/24 158 lb (71.7 kg)       Assessment & Plan:   Problem List Items Addressed This Visit     Hyperlipidemia LDL goal <100   Relevant Orders   Lipid panel (Completed)   Hyperparathyroidism   Relevant Orders   PTH, intact and calcium  (Completed)   Calcium , ionized (Completed)   Other Visit Diagnoses       Preoperative clearance    -  Primary   Relevant Orders   Ambulatory referral to Hematology / Oncology     History of DVT (deep vein thrombosis)       Relevant Orders   Ambulatory referral to Hematology / Oncology     Vitamin D  deficiency       Relevant Orders   VITAMIN D  25 Hydroxy (Vit-D Deficiency, Fractures) (Completed)     Family history of bleeding or clotting disorder       Relevant Orders   Ambulatory referral to Hematology / Oncology     OSA on CPAP         Hypercalcemia       Relevant Orders   PTH, intact and calcium  (Completed)   Calcium , ionized (Completed)   TSH (Completed)   Ambulatory referral to Endocrinology       Assessment and Plan Assessment & Plan Preoperative Medical Clearance for Right Total Knee Replacement Scheduled for right total knee replacement on December 2nd. Preoperative labs and tests completed. Concerns about hyperparathyroidism and venous thromboembolism (VTE) need resolution before clearance. - Ordered urgent referral for hematology workup due to history of VTE and  family history of DVT. - Ordered lab tests for parathyroid hormone and calcium  levels. - Coordinated with orthopedic surgeon for perioperative anticoagulation management.  Hyperparathyroidism Elevated calcium  levels with previous normal parathyroid hormone levels. Possible primary hyperparathyroidism due to parathyroid adenoma. Symptoms include osteoporosis and potential kidney issues. Further evaluation needed to confirm diagnosis and determine treatment. - Ordered lab tests for parathyroid hormone and calcium  levels. - Referred to endocrinology for further evaluation of hyperparathyroidism.  Osteoporosis Mild to moderate osteoporosis noted on bone scan. Possible  causes include age-related changes, lack of estrogen post-menopause, or hyperparathyroidism. Further evaluation needed to determine primary cause. - Ordered lab tests to evaluate calcium  and parathyroid hormone levels. - Will consider endocrinology referral for further evaluation.  History of Venous Thromboembolism (VTE) DVT related to previous surgery with family history of DVT. Concerns about perioperative clot risk for upcoming knee replacement. Discussed anticoagulation options and potential hematology workup to assess clotting risk. - Ordered urgent referral for hematology workup to assess clotting risk. - Coordinated with orthopedic surgeon for perioperative anticoagulation management.  Vitamin D  Deficiency Taking vitamin D3 supplements. Discussed role of vitamin D  in calcium  metabolism and potential impact on hyperparathyroidism. - Ordered lab test to check vitamin D  levels. - Advised to hold off on calcium  supplements.  Addendum: Patient calcium  elevated with normal PTH Referral to endocrinology for further evaluation but will clear her medically for surgery.  Consulted with Dr. Joshua regarding case.    I am having Denise G. Rather maintain her multivitamin with minerals, diclofenac  sodium, meloxicam , tiZANidine,  Coenzyme Q10 (Q-10 CO-ENZYME PO), FT ASHWAGANDHA EXTRACT PO, VALERIAN ROOT PO, lidocaine , OVER THE COUNTER MEDICATION, St Johns Wort, OVER THE COUNTER MEDICATION, glucosamine-chondroitin, and pantoprazole.  No orders of the defined types were placed in this encounter.

## 2024-04-30 LAB — CALCIUM, IONIZED: Calcium, Ion: 6 mg/dL — ABNORMAL HIGH (ref 4.7–5.5)

## 2024-05-01 ENCOUNTER — Inpatient Hospital Stay: Attending: Hematology and Oncology | Admitting: Hematology and Oncology

## 2024-05-01 ENCOUNTER — Inpatient Hospital Stay

## 2024-05-01 ENCOUNTER — Telehealth: Payer: Self-pay

## 2024-05-01 ENCOUNTER — Ambulatory Visit: Payer: Self-pay | Admitting: Family Medicine

## 2024-05-01 ENCOUNTER — Other Ambulatory Visit (HOSPITAL_BASED_OUTPATIENT_CLINIC_OR_DEPARTMENT_OTHER)

## 2024-05-01 ENCOUNTER — Encounter: Payer: Self-pay | Admitting: Hematology and Oncology

## 2024-05-01 VITALS — BP 104/65 | HR 51 | Resp 18 | Ht 60.25 in | Wt 157.0 lb

## 2024-05-01 DIAGNOSIS — Z801 Family history of malignant neoplasm of trachea, bronchus and lung: Secondary | ICD-10-CM | POA: Diagnosis not present

## 2024-05-01 DIAGNOSIS — Z86718 Personal history of other venous thrombosis and embolism: Secondary | ICD-10-CM | POA: Diagnosis present

## 2024-05-01 DIAGNOSIS — Z803 Family history of malignant neoplasm of breast: Secondary | ICD-10-CM | POA: Diagnosis not present

## 2024-05-01 DIAGNOSIS — Z87891 Personal history of nicotine dependence: Secondary | ICD-10-CM | POA: Diagnosis not present

## 2024-05-01 DIAGNOSIS — Z800 Family history of malignant neoplasm of digestive organs: Secondary | ICD-10-CM | POA: Insufficient documentation

## 2024-05-01 DIAGNOSIS — Z7901 Long term (current) use of anticoagulants: Secondary | ICD-10-CM | POA: Diagnosis not present

## 2024-05-01 LAB — PTH, INTACT AND CALCIUM
Calcium: 10.7 mg/dL — ABNORMAL HIGH (ref 8.6–10.4)
PTH: 23 pg/mL (ref 16–77)

## 2024-05-01 NOTE — Telephone Encounter (Signed)
 Copied from CRM [id number]. Topic: General - Other >> May 01, 2024  1:15 PM Burnard DEL wrote: Reason for CRM: Dr Tobie office called stating that they received patients clearance form, chest xray,and blood work.They are still needing office notes.  Fx#:[phone]

## 2024-05-01 NOTE — Progress Notes (Signed)
 Please let her know that I will sign off on her surgery from a medical standpoint. It looks like she saw hematology and they will address her anticoagulation during surgery and going forward. I referred her to [facility] for elevated calcium  levels. They will call her to schedule.

## 2024-05-01 NOTE — Progress Notes (Signed)
 [name] CONSULT NOTE  Patient Care Team: Lendia Boby CROME, NP-C as PCP - General (Family Medicine) Teresa Tinnie BROCKS, MD (Otolaryngology)   ASSESSMENT & PLAN:  Personal history of DVT (deep vein thrombosis) The patient is a poor historian and could not recall details of her personal history of DVT According to documentation by vascular surgeon from October 2025, she told them that she had blood clot approximately 15 years ago According to patient today, her blood clot was approximately 37 years ago Due to her mental health history, she could not recall the details She had 2 right knee arthroscopy procedures done, first procedure was 37 years ago and a second procedure was 15 years ago.  She could not recall being placed on anticoagulation therapy She had 1 left knee arthroscopy procedure approximately 20 years ago with no thrombotic episodes postoperatively She had cholecystectomy, 2 breast biopsies, 2 right hand surgeries, 1 left hand surgery, and complete hysterectomy at around the age of 4 and prophylactic anticoagulation therapy was not offered for her hysterectomy or cholecystectomy and she did not develop postoperative thrombotic episodes Given her extensive prior surgical histories without prophylactic anticoagulation coverage, it is unlikely that the patient has inheritable thrombophilic disorder. For the plan in the near future which is total right knee replacement surgery scheduled for December 2, I recommend the patient to take DVT prophylaxis in the form of oral anticoagulation therapy for minimum of 4 weeks to prevent risk of recurrent DVT We discussed risk, benefits, side effects of Lovenox versus Xarelto and patient is elected to go on Xarelto We discussed risk of bleeding and I recommend the patient to avoid taking St. John's wort and meloxicam  while she is on Xarelto.  It is not clear whether she has difficulties paying for insurance but since she is going to the  [place] for other health related issues, I wrote her a prescription of Xarelto on paper and she will try to fill it at the [facility] pharmacy  I plan to see her within 2 weeks after surgery to recheck her labs, review of toxicity and to make sure she has no signs of bleeding  Orders Placed This Encounter  Procedures   Basic Metabolic Panel - Cancer Center Only    Standing Status:   Future    Expiration Date:   05/01/2025   CBC with Differential (Cancer Center Only)    Standing Status:   Future    Expiration Date:   05/01/2025    All questions were answered. The patient knows to call the clinic with any problems, questions or concerns. The total time spent in the appointment was 60 minutes encounter with patients including review of chart and various tests results, discussions about plan of care and coordination of care plan  Almarie Bedford, MD 11/13/202510:45 AM  CHIEF COMPLAINTS/PURPOSE OF CONSULTATION:  Personal history of right lower extremity DVT, upcoming plan for right total knee replacement surgery  HISTORY OF PRESENTING ILLNESS:  Brittany [name] 68 y.o. female is here because of personal history of DVT.  The patient apologized for being forgetful due to her personal history of mental health disorder.  She thought she is coming to see me for hypercalcemia.  I clarified the reason for her visit today  She has remote history of right lower extremity DVT that occurred in the setting of postoperative complication after right knee arthroscopy surgery.  The details surrounding the diagnosis is unclear and she could not recall being on anticoagulation therapy  The patient was in ex-smoker but quit several years ago She was placed on birth control pill for only 3 to 4 months prior to her hysterectomy at the age of 38 due to significant menorrhagia.  To my knowledge, she was not placed on prophylactic anticoagulation therapy for that major procedure.  Due to strong family history of breast cancer, she was  not offered hormone replacement therapy  She had no prior history or diagnosis of cancer. Her age appropriate screening programs are up-to-date. In addition to her knee procedure and hysterectomy, she had bilateral knee arthroscopy procedures, cholecystectomy, breast biopsies, hand surgeries and others and never developed other forms of postoperative thrombotic episodes. She has from family history of blood clot in her mother and paternal side of family although she was never tested for thrombophilia disorders The patient had been pregnant before but suffered 2 miscarriages.  According to the patient, she does not have live birth  MEDICAL HISTORY:  Past Medical History:  Diagnosis Date   ACNE NEC    ALLERGIC RHINITIS    ALOPECIA    Anal fissure    Anxiety    Panic Attack.  None in a long time   Arthritis    Asymptomatic microscopic hematuria 12/05/2022   Bowel obstruction (HCC)    Breast mass 12/05/2022   Sep 04, 2008 Entered By: MEMORY GULL B Comment: benign on bx   Chronic headaches    migraine- none in years   Constipation    drinks tea with senna   DEPRESSION    Depression    DISORDER, BIPOLAR NEC    Family history of breast cancer    Family history of pancreatic cancer    GERD (gastroesophageal reflux disease)    HEMORRHOIDS-INTERNAL    History of stress test    ETT 10/17: Ex 7'; no chest pain, no ST changes, [facility] Treadmill Score 7   HYPERLIPIDEMIA    OBSTRUCTIVE SLEEP APNEA    Seasonal allergies     SURGICAL HISTORY: Past Surgical History:  Procedure Laterality Date   BREAST LUMPECTOMY Right last done 2005   x 2   CHOLECYSTECTOMY N/A 03/07/2014   Procedure: LAPAROSCOPIC CHOLECYSTECTOMY WITH INTRAOPERATIVE CHOLANGIOGRAM;  Surgeon: Elon Pacini, MD;  Location: [facility] OR;  Service: General;  Laterality: N/A;   EUS N/A 01/27/2014   Procedure: ESOPHAGEAL ENDOSCOPIC ULTRASOUND (EUS) RADIAL;  Surgeon: Elsie Cree, MD;  Location: [facility] ENDOSCOPY;  Service: Endoscopy;   Laterality: N/A;   EYE SURGERY  08/2021   Cataract surgey   HAND SURGERY Bilateral    for arthritis  left 02/02/14- right approx 2014   KNEE SURGERY Bilateral    right x 2 left x 1.  arthroscopy   RADICAL HYSTERECTOMY     thumb and pinkie finger surgery Right 06/19/2012   TONSILLECTOMY      SOCIAL HISTORY: Social History   Socioeconomic History   Marital status: Widowed    Spouse name: Not on file   Number of children: 4   Years of education: Not on file   Highest education level: Not on file  Occupational History   Occupation: disabled    Employer: UNEMPLOYED  Tobacco Use   Smoking status: Former    Current packs/day: 0.00    Average packs/day: 0.1 packs/day for 7.0 years (0.7 ttl pk-yrs)    Types: Cigarettes    Start date: 06/19/1974    Quit date: 06/19/1981    Years since quitting: 42.8   Smokeless tobacco: Never  Vaping Use  Vaping status: Never Used  Substance and Sexual Activity   Alcohol use: Yes    Comment: rare   Drug use: No   Sexual activity: Not on file  Other Topics Concern   Not on file  Social History Narrative   Lives alone/2025   Social Drivers of Health   Financial Resource Strain: Low Risk  (01/24/2024)   Overall Financial Resource Strain (CARDIA)    Difficulty of Paying Living Expenses: Not hard at all  Food Insecurity: No Food Insecurity (01/24/2024)   Hunger Vital Sign    Worried About Running Out of Food in the Last Year: Never true    Ran Out of Food in the Last Year: Never true  Transportation Needs: No Transportation Needs (01/24/2024)   PRAPARE - Administrator, Civil Service (Medical): No    Lack of Transportation (Non-Medical): No  Physical Activity: Insufficiently Active (01/24/2024)   Exercise Vital Sign    Days of Exercise per Week: 7 days    Minutes of Exercise per Session: 20 min  Stress: No Stress Concern Present (01/24/2024)   [employer] of Occupational Health - Occupational Stress Questionnaire    Feeling of  Stress: Not at all  Social Connections: Moderately Integrated (01/24/2024)   Social Connection and Isolation Panel    Frequency of Communication with Friends and Family: More than three times a week    Frequency of Social Gatherings with Friends and Family: Once a week    Attends Religious Services: More than 4 times per year    Active Member of [employer] or Organizations: Yes    Attends Banker Meetings: Never    Marital Status: Widowed  Intimate Partner Violence: Patient Unable To Answer (01/24/2024)   Humiliation, Afraid, Rape, and Kick questionnaire    Fear of Current or Ex-Partner: Patient unable to answer    Emotionally Abused: Patient unable to answer    Physically Abused: Patient unable to answer    Sexually Abused: Patient unable to answer    FAMILY HISTORY: Family History  Problem Relation Age of Onset   Other Paternal Aunt        x 2, blood clotting disorder   Breast cancer Paternal Aunt 79   Heart disease Sister    Kidney disease Maternal Uncle    Esophageal cancer Paternal Uncle    Pancreatic cancer Paternal Uncle    COPD Father    Breast cancer Mother 17   Breast cancer Maternal Grandmother 45   Stroke Maternal Grandfather    Aneurysm Paternal Grandmother        brain   Hypertension Brother    Breast cancer Maternal Aunt        dx in her 74s   Breast cancer Maternal Aunt        dx in her 33s   Cancer Maternal Uncle        NOS   Cancer Maternal Uncle        tumor on his head   Breast cancer Paternal Aunt        dx in her 10s   Breast cancer Paternal Aunt        dx in her 27s   Lung cancer Paternal Uncle    Breast cancer Cousin 52       paternal first cousin   Colon cancer Maternal Uncle    Colon polyps Maternal Uncle    Irritable bowel syndrome Cousin    Rectal cancer Neg Hx  Stomach cancer Neg Hx     ALLERGIES:  is allergic to flexeril  [cyclobenzaprine ], nsaids, hydrocodone , motrin  [ibuprofen ], prednisone, and robaxin   [methocarbamol ].  MEDICATIONS:  Current Outpatient Medications  Medication Sig Dispense Refill   Coenzyme Q10 (Q-10 CO-ENZYME PO) Take by mouth.     diclofenac  sodium (VOLTAREN ) 1 % GEL Apply 2 g topically 4 (four) times daily. 100 g 1   FT ASHWAGANDHA EXTRACT PO      glucosamine-chondroitin 500-400 MG tablet Take by mouth. daily     lidocaine  (LIDODERM ) 5 % Place 1 patch onto the skin daily.     meloxicam  (MOBIC ) 15 MG tablet Take 1 tablet (15 mg total) by mouth daily. (Patient taking differently: Take 15 mg by mouth daily as needed.) 30 tablet 1   Multiple Vitamin (MULTIVITAMIN WITH MINERALS) TABS Take 1 tablet by mouth daily.     OVER THE COUNTER MEDICATION 5 HTP- 2 daily     OVER THE COUNTER MEDICATION Magnesium 500mg  : 2 daily at bedtime     pantoprazole (PROTONIX) 40 MG tablet Take 1 tablet (40 mg total) by mouth daily. 90 tablet 0   St Johns Wort 150 MG CAPS Take by mouth. 2 daily     tiZANidine (ZANAFLEX) 2 MG tablet  (Patient taking differently: As needed)     VALERIAN ROOT PO      No current facility-administered medications for this visit.    REVIEW OF SYSTEMS:   Constitutional: Denies fevers, chills or abnormal night sweats Eyes: Denies blurriness of vision, double vision or watery eyes Ears, nose, mouth, throat, and face: Denies mucositis or sore throat Respiratory: Denies cough, dyspnea or wheezes Cardiovascular: Denies palpitation, chest discomfort or lower extremity swelling Gastrointestinal:  Denies nausea, heartburn or change in bowel habits Skin: Denies abnormal skin rashes Lymphatics: Denies new lymphadenopathy or easy bruising Neurological:Denies numbness, tingling or new weaknesses Behavioral/Psych: Mood is stable, no new changes  All other systems were reviewed with the patient and are negative.  PHYSICAL EXAMINATION: ECOG PERFORMANCE STATUS: 0 - Asymptomatic  Vitals:   05/01/24 0935  BP: 104/65  Pulse: (!) 51  Resp: 18  SpO2: 99%   Filed Weights    05/01/24 0935  Weight: 157 lb (71.2 kg)    GENERAL:alert, no distress and comfortable PSYCH: alert & oriented x 3 with fluent speech NEURO: no focal motor/sensory deficits  LABORATORY DATA:  I have reviewed the data as listed Lab Results  Component Value Date   WBC 6.2 04/14/2024   HGB 13.2 04/14/2024   HCT 41.5 04/14/2024   MCV 95.2 04/14/2024   PLT 304 04/14/2024     RADIOGRAPHIC STUDIES: I have personally reviewed the radiological images as listed and agreed with the findings in the report. DG Chest 2 View Result Date: 04/14/2024 CLINICAL DATA:  Preop clearance. History of venous thrombosis and embolism. EXAM: CHEST - 2 VIEW COMPARISON:  02/01/2022 FINDINGS: Cardiomediastinal silhouette and pulmonary vasculature are within normal limits. Lungs are clear. IMPRESSION: No acute cardiopulmonary process. Electronically Signed   By: Aliene Lloyd M.D.   On: 04/14/2024 08:51   DG Abd 1 View Result Date: 04/11/2024 EXAM: 1 VIEW XRAY OF THE ABDOMEN 04/09/2024 11:58:22 AM COMPARISON: 01/13/2014 CLINICAL HISTORY: FINDINGS: BOWEL: Nonobstructive bowel gas pattern. Mild stool burden within large bowel. SOFT TISSUES: Cholecystectomy clips in right upper quadrant. No opaque urinary calculi. BONES: No acute osseous abnormality. IMPRESSION: 1. Mild stool burden within the large bowel. Electronically signed by: Dayne Hassell MD 04/11/2024 02:42 PM EDT RP  Workstation: [id number]   VAS US  ABI WITH/WO TBI Result Date: 04/02/2024  LOWER EXTREMITY DOPPLER STUDY Patient Name:  JAHNAVI MURATORE  Date of Exam:   04/02/2024 Medical Rec #: [id number]       Accession #:    [id number] Date of Birth: [birth year]      Patient Gender: F Patient Age:   2 years Exam Location:  [address] Procedure:      VAS US  ABI WITH/WO TBI Referring Phys: PENNE COLORADO --------------------------------------------------------------------------------  Indications: Evaluation for upcoming TKA. High Risk Factors: Hyperlipidemia.   Performing Technologist: Geni Lodge RVS, RCS  Examination Guidelines: A complete evaluation includes at minimum, Doppler waveform signals and systolic blood pressure reading at the level of bilateral brachial, anterior tibial, and posterior tibial arteries, when vessel segments are accessible. Bilateral testing is considered an integral part of a complete examination. Photoelectric Plethysmograph (PPG) waveforms and toe systolic pressure readings are included as required and additional duplex testing as needed. Limited examinations for reoccurring indications may be performed as noted.  ABI Findings: +---------+------------------+-----+---------+--------+ Right    Rt Pressure (mmHg)IndexWaveform Comment  +---------+------------------+-----+---------+--------+ Brachial 90                                       +---------+------------------+-----+---------+--------+ PTA      130               1.26 triphasic         +---------+------------------+-----+---------+--------+ DP       124               1.20 triphasic         +---------+------------------+-----+---------+--------+ Great Toe99                0.96                   +---------+------------------+-----+---------+--------+ +---------+------------------+-----+---------+-------+ Left     Lt Pressure (mmHg)IndexWaveform Comment +---------+------------------+-----+---------+-------+ Brachial 103                                     +---------+------------------+-----+---------+-------+ PTA      119               1.16 triphasic        +---------+------------------+-----+---------+-------+ DP       131               1.27 triphasic        +---------+------------------+-----+---------+-------+ Burnetta Boyers               1.09                  +---------+------------------+-----+---------+-------+ +-------+-----------+-----------+------------+------------+ ABI/TBIToday's ABIToday's TBIPrevious ABIPrevious  TBI +-------+-----------+-----------+------------+------------+ Right  1.26       0.96                                +-------+-----------+-----------+------------+------------+ Left   1.27       1.09                                +-------+-----------+-----------+------------+------------+   Summary: Right: Resting right ankle-brachial index is within normal range. The right toe-brachial index is normal.  Left: Resting left ankle-brachial index is  within normal range. The left toe-brachial index is normal.  *See table(s) above for measurements and observations.  Electronically signed by Penne Colorado MD on 04/02/2024 at 3:55:47 PM.    Final

## 2024-05-01 NOTE — Assessment & Plan Note (Signed)
 The patient is a poor historian and could not recall details of her personal history of DVT According to documentation by vascular surgeon from October 2025, she told them that she had blood clot approximately 15 years ago According to patient today, her blood clot was approximately 37 years ago Due to her mental health history, she could not recall the details She had 2 right knee arthroscopy procedures done, first procedure was 37 years ago and a second procedure was 15 years ago.  She could not recall being placed on anticoagulation therapy She had 1 left knee arthroscopy procedure approximately 20 years ago with no thrombotic episodes postoperatively She had cholecystectomy, 2 breast biopsies, 2 right hand surgeries, 1 left hand surgery, and complete hysterectomy at around the age of 72 and prophylactic anticoagulation therapy was not offered for her hysterectomy or cholecystectomy and she did not develop postoperative thrombotic episodes Given her extensive prior surgical histories without prophylactic anticoagulation coverage, it is unlikely that the patient has inheritable thrombophilic disorder. For the plan in the near future which is total right knee replacement surgery scheduled for December 2, I recommend the patient to take DVT prophylaxis in the form of oral anticoagulation therapy for minimum of 4 weeks to prevent risk of recurrent DVT We discussed risk, benefits, side effects of Lovenox versus Xarelto and patient is elected to go on Xarelto We discussed risk of bleeding and I recommend the patient to avoid taking St. John's wort and meloxicam  while she is on Xarelto.  It is not clear whether she has difficulties paying for insurance but since she is going to the [place] for other health related issues, I wrote her a prescription of Xarelto on paper and she will try to fill it at the [facility] pharmacy  I plan to see her within 2 weeks after surgery to recheck her labs, review of toxicity and to make  sure she has no signs of bleeding

## 2024-05-01 NOTE — Telephone Encounter (Signed)
 Called Dr. Anthony office as we have NOT sent anything over to that office in regards to pt clearance. After further review of this, they state this was a miscommunication and they state they do not have anything on our end from the primary care office. I informed them we just got her labs this morning and so Vickie will still have to review and once it has been done, we will be in touch and fax over her response to the pre op recommendations.

## 2024-05-02 ENCOUNTER — Telehealth: Payer: Self-pay

## 2024-05-02 NOTE — Telephone Encounter (Signed)
 OK

## 2024-05-02 NOTE — Telephone Encounter (Signed)
 She called and left a message to update her supplements. Med list updated.  She wanted Dr. Lonn to be aware of supplements since she has Rx for Xarelto.

## 2024-05-02 NOTE — Telephone Encounter (Signed)
 Clearance forms faxed over, recent office notes attached

## 2024-05-02 NOTE — Telephone Encounter (Signed)
 Called left message medication list updated.

## 2024-05-05 ENCOUNTER — Telehealth: Payer: Self-pay

## 2024-05-05 NOTE — Telephone Encounter (Signed)
 Fish oil would increase risk of bleeding Do not take Fish oil until she is done with Xarelto

## 2024-05-05 NOTE — Telephone Encounter (Signed)
 She called and left a message that she also takes fish oil. Added to her medication list. She wants to be aware since she takes Xarelto and wants to make sure it is ok to continue.

## 2024-05-05 NOTE — Telephone Encounter (Signed)
 Called and given below message to stop Fish oil while taking Xarelto. She verbalized understanding.  She forgot that she take magnesium at night for sleep added to her medication list.

## 2024-05-27 ENCOUNTER — Encounter (HOSPITAL_BASED_OUTPATIENT_CLINIC_OR_DEPARTMENT_OTHER): Payer: Self-pay | Admitting: Internal Medicine

## 2024-05-27 DIAGNOSIS — G4733 Obstructive sleep apnea (adult) (pediatric): Secondary | ICD-10-CM

## 2024-06-05 ENCOUNTER — Inpatient Hospital Stay: Admitting: Hematology and Oncology

## 2024-06-05 ENCOUNTER — Encounter: Payer: Self-pay | Admitting: Hematology and Oncology

## 2024-06-05 ENCOUNTER — Inpatient Hospital Stay: Attending: Hematology and Oncology

## 2024-06-05 VITALS — BP 112/73 | HR 69 | Temp 98.4°F | Resp 18 | Ht 60.25 in | Wt 159.6 lb

## 2024-06-05 DIAGNOSIS — Z86718 Personal history of other venous thrombosis and embolism: Secondary | ICD-10-CM | POA: Insufficient documentation

## 2024-06-05 DIAGNOSIS — Z7982 Long term (current) use of aspirin: Secondary | ICD-10-CM | POA: Insufficient documentation

## 2024-06-05 LAB — CBC WITH DIFFERENTIAL (CANCER CENTER ONLY)
Abs Immature Granulocytes: 0.02 K/uL (ref 0.00–0.07)
Basophils Absolute: 0.1 K/uL (ref 0.0–0.1)
Basophils Relative: 1 %
Eosinophils Absolute: 0.2 K/uL (ref 0.0–0.5)
Eosinophils Relative: 4 %
HCT: 36.3 % (ref 36.0–46.0)
Hemoglobin: 12.2 g/dL (ref 12.0–15.0)
Immature Granulocytes: 0 %
Lymphocytes Relative: 27 %
Lymphs Abs: 1.5 K/uL (ref 0.7–4.0)
MCH: 31.4 pg (ref 26.0–34.0)
MCHC: 33.6 g/dL (ref 30.0–36.0)
MCV: 93.3 fL (ref 80.0–100.0)
Monocytes Absolute: 0.4 K/uL (ref 0.1–1.0)
Monocytes Relative: 8 %
Neutro Abs: 3.4 K/uL (ref 1.7–7.7)
Neutrophils Relative %: 60 %
Platelet Count: 390 K/uL (ref 150–400)
RBC: 3.89 MIL/uL (ref 3.87–5.11)
RDW: 14.1 % (ref 11.5–15.5)
WBC Count: 5.6 K/uL (ref 4.0–10.5)
nRBC: 0 % (ref 0.0–0.2)

## 2024-06-05 LAB — BASIC METABOLIC PANEL - CANCER CENTER ONLY
Anion gap: 8 (ref 5–15)
BUN: 10 mg/dL (ref 8–23)
CO2: 26 mmol/L (ref 22–32)
Calcium: 10.5 mg/dL — ABNORMAL HIGH (ref 8.9–10.3)
Chloride: 105 mmol/L (ref 98–111)
Creatinine: 0.65 mg/dL (ref 0.44–1.00)
GFR, Estimated: >60 mL/min (ref >60)
Glucose, Bld: 105 mg/dL — ABNORMAL HIGH (ref 70–99)
Potassium: 3.8 mmol/L (ref 3.5–5.1)
Sodium: 139 mmol/L (ref 135–145)

## 2024-06-05 NOTE — Assessment & Plan Note (Addendum)
 The patient is a poor historian and could not recall details of her personal history of DVT According to documentation by vascular surgeon from October 2025, she told them that she had blood clot approximately 15 years ago According to patient today, her blood clot was approximately 37 years ago Due to her mental health history, she could not recall the details She had 2 right knee arthroscopy procedures done, first procedure was 37 years ago and a second procedure was 15 years ago.  She could not recall being placed on anticoagulation therapy She had 1 left knee arthroscopy procedure approximately 20 years ago with no thrombotic episodes postoperatively She had cholecystectomy, 2 breast biopsies, 2 right hand surgeries, 1 left hand surgery, and complete hysterectomy at around the age of 31 and prophylactic anticoagulation therapy was not offered for her hysterectomy or cholecystectomy and she did not develop postoperative thrombotic episodes Given her extensive prior surgical histories without prophylactic anticoagulation coverage, it is unlikely that the patient has inheritable thrombophilic disorder.  When I saw the patient last, we have a lot of discussion and multiple phone calls back and forth I was under the impression that the patient has started Xarelto since her surgery However, her surgeon put her on aspirin  instead of Xarelto and she has not taken any Xarelto Today, we discussed risk and benefits of Xarelto which is considered superior to aspirin  to prevent recurrent DVT after major knee surgery She is interested to resume Xarelto and will stop aspirin  She does not need follow-up appointment to see me back in the future unless she have another surgery planned in the future

## 2024-06-05 NOTE — Progress Notes (Signed)
 [facility] OFFICE PROGRESS NOTE  Patient Care Team: Brittany Boby CROME, NP-C as PCP - General (Family Medicine) Brittany Tinnie BROCKS, MD (Otolaryngology)  Assessment & Plan Personal history of DVT (deep vein thrombosis) The patient is a poor historian and could not recall details of her personal history of DVT According to documentation by vascular surgeon from October 2025, she told them that she had blood clot approximately 15 years ago According to patient today, her blood clot was approximately 37 years ago Due to her mental health history, she could not recall the details She had 2 right knee arthroscopy procedures done, first procedure was 37 years ago and a second procedure was 15 years ago.  She could not recall being placed on anticoagulation therapy She had 1 left knee arthroscopy procedure approximately 20 years ago with no thrombotic episodes postoperatively She had cholecystectomy, 2 breast biopsies, 2 right hand surgeries, 1 left hand surgery, and complete hysterectomy at around the age of 39 and prophylactic anticoagulation therapy was not offered for her hysterectomy or cholecystectomy and she did not develop postoperative thrombotic episodes Given her extensive prior surgical histories without prophylactic anticoagulation coverage, it is unlikely that the patient has inheritable thrombophilic disorder.  When I saw the patient last, we have a lot of discussion and multiple phone calls back and forth I was under the impression that the patient has started Xarelto since her surgery However, her surgeon put her on aspirin  instead of Xarelto and she has not taken any Xarelto Today, we discussed risk and benefits of Xarelto which is considered superior to aspirin  to prevent recurrent DVT after major knee surgery She is interested to resume Xarelto and will stop aspirin  She does not need follow-up appointment to see me back in the future unless she have another surgery  planned in the future  No orders of the defined types were placed in this encounter.    Brittany Bedford, MD  INTERVAL HISTORY: she returns for surveillance follow-up for history of DVT/PE She had successful surgery 2 weeks ago She is taking aspirin  as directed by her surgeon and did not take Xarelto She had some bruises in her late but no bleeding Patient denies recent bleeding such as epistaxis, hematuria or hematochezia We reviewed medication list and discussed medication changes We discussed test results and future plan of care as outlined above  PHYSICAL EXAMINATION: ECOG PERFORMANCE STATUS: 0 - Asymptomatic  Vitals:   06/05/24 1041  BP: 112/73  Pulse: 69  Resp: 18  Temp: 98.4 F (36.9 C)  SpO2: 100%   Lab Results  Component Value Date   WBC 5.6 06/05/2024   HGB 12.2 06/05/2024   HCT 36.3 06/05/2024   MCV 93.3 06/05/2024   PLT 390 06/05/2024    SUMMARY OF HEMATOLOGIC HISTORY:

## 2024-06-18 ENCOUNTER — Telehealth: Payer: Self-pay

## 2024-06-18 NOTE — Telephone Encounter (Signed)
 Copied from CRM #[id number]. Topic: Referral - Status >> Jun 18, 2024  8:48 AM Laymon HERO wrote: Reason for CRM: Patient wanting a call from a nurse to speak about referral to Endocrinologist- she has not been able to contact the office and no one has reached out to her. Please contact patient

## 2024-06-18 NOTE — Telephone Encounter (Signed)
 Called pt and provided her with [facility]'s number

## 2024-07-25 ENCOUNTER — Encounter: Admitting: Adult Health

## 2024-08-08 ENCOUNTER — Ambulatory Visit (HOSPITAL_BASED_OUTPATIENT_CLINIC_OR_DEPARTMENT_OTHER): Admitting: Pulmonary Disease

## 2024-09-17 ENCOUNTER — Ambulatory Visit: Admitting: Psychiatry

## 2024-10-13 ENCOUNTER — Ambulatory Visit: Admitting: "Endocrinology

## 2025-01-27 ENCOUNTER — Ambulatory Visit
